# Patient Record
Sex: Female | Born: 1945 | Race: White | Hispanic: No | Marital: Single | Smoking: Former smoker
Health system: Southern US, Community
[De-identification: ages and names within clinical notes are randomized; demographics above are authoritative.]

## PROBLEM LIST (undated history)

## (undated) DIAGNOSIS — F419 Anxiety disorder, unspecified: Secondary | ICD-10-CM

## (undated) DIAGNOSIS — J449 Chronic obstructive pulmonary disease, unspecified: Secondary | ICD-10-CM

## (undated) DIAGNOSIS — F329 Major depressive disorder, single episode, unspecified: Secondary | ICD-10-CM

## (undated) DIAGNOSIS — I10 Essential (primary) hypertension: Secondary | ICD-10-CM

## (undated) DIAGNOSIS — E785 Hyperlipidemia, unspecified: Secondary | ICD-10-CM

## (undated) DIAGNOSIS — F32A Depression, unspecified: Secondary | ICD-10-CM

## (undated) HISTORY — PX: APPENDECTOMY: SHX54

## (undated) HISTORY — PX: SHOULDER SURGERY: SHX246

## (undated) HISTORY — PX: TONSILLECTOMY: SUR1361

---

## 2000-02-16 ENCOUNTER — Other Ambulatory Visit: Admission: RE | Admit: 2000-02-16 | Discharge: 2000-02-16 | Payer: Self-pay | Admitting: Internal Medicine

## 2001-03-21 ENCOUNTER — Other Ambulatory Visit: Admission: RE | Admit: 2001-03-21 | Discharge: 2001-03-21 | Payer: Self-pay | Admitting: Internal Medicine

## 2002-01-02 ENCOUNTER — Encounter: Payer: Self-pay | Admitting: *Deleted

## 2002-01-02 ENCOUNTER — Emergency Department (HOSPITAL_COMMUNITY): Admission: EM | Admit: 2002-01-02 | Discharge: 2002-01-03 | Payer: Self-pay | Admitting: *Deleted

## 2002-02-27 ENCOUNTER — Other Ambulatory Visit: Admission: RE | Admit: 2002-02-27 | Discharge: 2002-02-27 | Payer: Self-pay | Admitting: Internal Medicine

## 2002-10-09 ENCOUNTER — Ambulatory Visit (HOSPITAL_COMMUNITY): Admission: RE | Admit: 2002-10-09 | Discharge: 2002-10-09 | Payer: Self-pay | Admitting: *Deleted

## 2002-10-09 ENCOUNTER — Encounter (INDEPENDENT_AMBULATORY_CARE_PROVIDER_SITE_OTHER): Payer: Self-pay | Admitting: Specialist

## 2003-03-29 ENCOUNTER — Other Ambulatory Visit: Admission: RE | Admit: 2003-03-29 | Discharge: 2003-03-29 | Payer: Self-pay | Admitting: Internal Medicine

## 2004-07-07 ENCOUNTER — Other Ambulatory Visit: Admission: RE | Admit: 2004-07-07 | Discharge: 2004-07-07 | Payer: Self-pay | Admitting: Internal Medicine

## 2005-11-12 ENCOUNTER — Ambulatory Visit (HOSPITAL_COMMUNITY): Admission: RE | Admit: 2005-11-12 | Discharge: 2005-11-12 | Payer: Self-pay | Admitting: *Deleted

## 2005-11-12 ENCOUNTER — Encounter (INDEPENDENT_AMBULATORY_CARE_PROVIDER_SITE_OTHER): Payer: Self-pay | Admitting: Specialist

## 2005-11-19 ENCOUNTER — Other Ambulatory Visit: Admission: RE | Admit: 2005-11-19 | Discharge: 2005-11-19 | Payer: Self-pay | Admitting: Internal Medicine

## 2006-12-13 ENCOUNTER — Other Ambulatory Visit: Admission: RE | Admit: 2006-12-13 | Discharge: 2006-12-13 | Payer: Self-pay | Admitting: Internal Medicine

## 2008-12-09 ENCOUNTER — Other Ambulatory Visit: Admission: RE | Admit: 2008-12-09 | Discharge: 2008-12-09 | Payer: Self-pay | Admitting: Internal Medicine

## 2011-02-09 ENCOUNTER — Other Ambulatory Visit: Payer: Self-pay | Admitting: Surgery

## 2011-03-05 NOTE — Op Note (Signed)
NAME:  BRENYA, TAULBEE                     ACCOUNT NO.:  [id number]   MEDICAL RECORD NO.:  [id number]                   PATIENT TYPE:  AMB   LOCATION:  ENDO                                 FACILITY:  [facility]   PHYSICIAN:  Waverly Ferrari, M.D.                 DATE OF BIRTH:  [birth year]   DATE OF PROCEDURE:  10/09/2002  DATE OF DISCHARGE:                                 OPERATIVE REPORT   PROCEDURE PERFORMED:  Colonoscopy with polypectomy and biopsy and  eradication of polyp.   ENDOSCOPIST:  Waverly Ferrari, M.D.   ANESTHESIA:  Demerol 50 mg, Versed 5 mg.   DESCRIPTION OF PROCEDURE:  With the patient mildly sedated in the left  lateral decubitus position, the Olympus video colonoscope was inserted in  the rectum and passed under direct vision to the cecum, identified by the  ileocecal valve and appendiceal orifice, both of which were photographed and  appeared unremarkable.  From this point the colonoscope was slowly withdrawn  taking circumferential views of the entire colonic mucosa as we pulled back  all the way to the rectum stopping only at 15 cm from the anal verge at  which point, a fairly large sessile polyp/mass was seen, photographed and  first was biopsied, subsequently using snare cautery technique.  A good bit  of the remaining tissue was removed and finally using Erbe argon  photocoagulator, the remainder of the tissue was eradicated using argon  probe.  Once they had been eradicated satisfactorily and there was no  evidence of bleeding, the colonoscope was withdrawn all the way to the  rectum, which appeared normal on direct and showed hemorrhoids on retroflex  view.  The endoscope was straightened and withdrawn.  The patient's vital  signs and pulse oximeter remained stable.  The patient tolerated the  procedure well without apparent complications.   FINDINGS:  Large polyp, question mass at 15 cm from the anal verge, await  biopsy reports.  The patient will call me  for results and follow up with me  as an outpatient.  Will avoid aspirin for two weeks, place the patient on a  low residue diet for the next seven to 14 days.  Internal hemorrhoids.  Otherwise unremarkable examination.                                                 Waverly Ferrari, M.D.    GMO/MEDQ  D:  10/09/2002  T:  10/09/2002  Job:  [id number]

## 2011-03-05 NOTE — Op Note (Signed)
Kayla Mack, LATON NO.:  [id number]   MEDICAL RECORD NO.:  [id number]          PATIENT TYPE:  AMB   LOCATION:  ENDO                         FACILITY:  [facility]   PHYSICIAN:  Waverly Ferrari, M.D.    DATE OF BIRTH:  12-02-1945   DATE OF PROCEDURE:  11/12/2005  DATE OF DISCHARGE:                                 OPERATIVE REPORT   PROCEDURE:  Colonoscopy with biopsy.   INDICATIONS:  Colon polyps.   ANESTHESIA:  Demerol 50, Versed 5 mg.   DESCRIPTION OF PROCEDURE:  With the patient mildly sedated in the left  lateral decubitus position, the Olympus videoscopic colonoscope was inserted  into the rectum and passed under direct vision to the cecum identified by  the ileocecal valve and appendiceal orifice in the cecum were two polyps,  one was removed using hot biopsy forceps technique, the other was more  carpet like and was eradicate using the hot biopsy forceps, both were  photographed once this was completed and subsequently the colonoscope was  withdrawn taking circumferential views of the colonic mucosa stopping next  only in the splenic flexure were a third polyp was seen and it too was  removed using hot biopsy forceps technique, all with the same setting of  20/20 blended current. The endoscope was withdrawn all the way to the rectum  which appeared normal on direct and retroflexed view. The endoscope was  straightened and withdrawn. The patient's vital signs and pulse oximeter  remained stable. The patient tolerated the procedure well without apparent  complication.   FINDINGS:  Polyps of cecum and of splenic flexure. Await biopsy report. The  patient will call me for results and follow-up with me as an outpatient.           ______________________________  Waverly Ferrari, M.D.     GMO/MEDQ  D:  11/12/2005  T:  11/12/2005  Job:  [id number]

## 2011-11-04 DIAGNOSIS — F329 Major depressive disorder, single episode, unspecified: Secondary | ICD-10-CM | POA: Diagnosis not present

## 2011-11-04 DIAGNOSIS — F4323 Adjustment disorder with mixed anxiety and depressed mood: Secondary | ICD-10-CM | POA: Diagnosis not present

## 2011-11-24 DIAGNOSIS — F329 Major depressive disorder, single episode, unspecified: Secondary | ICD-10-CM | POA: Diagnosis not present

## 2011-11-24 DIAGNOSIS — F4323 Adjustment disorder with mixed anxiety and depressed mood: Secondary | ICD-10-CM | POA: Diagnosis not present

## 2011-11-29 DIAGNOSIS — F4323 Adjustment disorder with mixed anxiety and depressed mood: Secondary | ICD-10-CM | POA: Diagnosis not present

## 2011-11-29 DIAGNOSIS — F329 Major depressive disorder, single episode, unspecified: Secondary | ICD-10-CM | POA: Diagnosis not present

## 2011-12-09 DIAGNOSIS — F329 Major depressive disorder, single episode, unspecified: Secondary | ICD-10-CM | POA: Diagnosis not present

## 2011-12-09 DIAGNOSIS — F4323 Adjustment disorder with mixed anxiety and depressed mood: Secondary | ICD-10-CM | POA: Diagnosis not present

## 2011-12-29 DIAGNOSIS — F329 Major depressive disorder, single episode, unspecified: Secondary | ICD-10-CM | POA: Diagnosis not present

## 2011-12-29 DIAGNOSIS — F4323 Adjustment disorder with mixed anxiety and depressed mood: Secondary | ICD-10-CM | POA: Diagnosis not present

## 2012-01-05 DIAGNOSIS — F4323 Adjustment disorder with mixed anxiety and depressed mood: Secondary | ICD-10-CM | POA: Diagnosis not present

## 2012-01-05 DIAGNOSIS — F329 Major depressive disorder, single episode, unspecified: Secondary | ICD-10-CM | POA: Diagnosis not present

## 2012-01-12 DIAGNOSIS — F4323 Adjustment disorder with mixed anxiety and depressed mood: Secondary | ICD-10-CM | POA: Diagnosis not present

## 2012-01-12 DIAGNOSIS — F329 Major depressive disorder, single episode, unspecified: Secondary | ICD-10-CM | POA: Diagnosis not present

## 2012-01-19 DIAGNOSIS — F329 Major depressive disorder, single episode, unspecified: Secondary | ICD-10-CM | POA: Diagnosis not present

## 2012-01-19 DIAGNOSIS — F4323 Adjustment disorder with mixed anxiety and depressed mood: Secondary | ICD-10-CM | POA: Diagnosis not present

## 2012-01-21 DIAGNOSIS — F4323 Adjustment disorder with mixed anxiety and depressed mood: Secondary | ICD-10-CM | POA: Diagnosis not present

## 2012-01-21 DIAGNOSIS — F329 Major depressive disorder, single episode, unspecified: Secondary | ICD-10-CM | POA: Diagnosis not present

## 2012-01-25 DIAGNOSIS — F329 Major depressive disorder, single episode, unspecified: Secondary | ICD-10-CM | POA: Diagnosis not present

## 2012-01-25 DIAGNOSIS — F4323 Adjustment disorder with mixed anxiety and depressed mood: Secondary | ICD-10-CM | POA: Diagnosis not present

## 2012-02-07 DIAGNOSIS — F4323 Adjustment disorder with mixed anxiety and depressed mood: Secondary | ICD-10-CM | POA: Diagnosis not present

## 2012-02-07 DIAGNOSIS — F329 Major depressive disorder, single episode, unspecified: Secondary | ICD-10-CM | POA: Diagnosis not present

## 2012-02-10 DIAGNOSIS — F329 Major depressive disorder, single episode, unspecified: Secondary | ICD-10-CM | POA: Diagnosis not present

## 2012-02-10 DIAGNOSIS — F4323 Adjustment disorder with mixed anxiety and depressed mood: Secondary | ICD-10-CM | POA: Diagnosis not present

## 2012-02-18 DIAGNOSIS — F4323 Adjustment disorder with mixed anxiety and depressed mood: Secondary | ICD-10-CM | POA: Diagnosis not present

## 2012-02-18 DIAGNOSIS — F329 Major depressive disorder, single episode, unspecified: Secondary | ICD-10-CM | POA: Diagnosis not present

## 2012-03-01 DIAGNOSIS — F329 Major depressive disorder, single episode, unspecified: Secondary | ICD-10-CM | POA: Diagnosis not present

## 2012-03-01 DIAGNOSIS — F4323 Adjustment disorder with mixed anxiety and depressed mood: Secondary | ICD-10-CM | POA: Diagnosis not present

## 2012-03-06 DIAGNOSIS — F4323 Adjustment disorder with mixed anxiety and depressed mood: Secondary | ICD-10-CM | POA: Diagnosis not present

## 2012-03-06 DIAGNOSIS — F329 Major depressive disorder, single episode, unspecified: Secondary | ICD-10-CM | POA: Diagnosis not present

## 2012-03-14 DIAGNOSIS — F4323 Adjustment disorder with mixed anxiety and depressed mood: Secondary | ICD-10-CM | POA: Diagnosis not present

## 2012-03-14 DIAGNOSIS — F329 Major depressive disorder, single episode, unspecified: Secondary | ICD-10-CM | POA: Diagnosis not present

## 2012-03-22 DIAGNOSIS — F329 Major depressive disorder, single episode, unspecified: Secondary | ICD-10-CM | POA: Diagnosis not present

## 2012-03-22 DIAGNOSIS — F4323 Adjustment disorder with mixed anxiety and depressed mood: Secondary | ICD-10-CM | POA: Diagnosis not present

## 2012-03-29 DIAGNOSIS — F329 Major depressive disorder, single episode, unspecified: Secondary | ICD-10-CM | POA: Diagnosis not present

## 2012-03-29 DIAGNOSIS — F4323 Adjustment disorder with mixed anxiety and depressed mood: Secondary | ICD-10-CM | POA: Diagnosis not present

## 2012-04-06 DIAGNOSIS — F4323 Adjustment disorder with mixed anxiety and depressed mood: Secondary | ICD-10-CM | POA: Diagnosis not present

## 2012-04-06 DIAGNOSIS — F329 Major depressive disorder, single episode, unspecified: Secondary | ICD-10-CM | POA: Diagnosis not present

## 2012-04-17 DIAGNOSIS — F329 Major depressive disorder, single episode, unspecified: Secondary | ICD-10-CM | POA: Diagnosis not present

## 2012-04-17 DIAGNOSIS — F4323 Adjustment disorder with mixed anxiety and depressed mood: Secondary | ICD-10-CM | POA: Diagnosis not present

## 2012-04-25 DIAGNOSIS — F4323 Adjustment disorder with mixed anxiety and depressed mood: Secondary | ICD-10-CM | POA: Diagnosis not present

## 2012-04-25 DIAGNOSIS — F329 Major depressive disorder, single episode, unspecified: Secondary | ICD-10-CM | POA: Diagnosis not present

## 2012-05-22 DIAGNOSIS — F329 Major depressive disorder, single episode, unspecified: Secondary | ICD-10-CM | POA: Diagnosis not present

## 2012-05-22 DIAGNOSIS — F4323 Adjustment disorder with mixed anxiety and depressed mood: Secondary | ICD-10-CM | POA: Diagnosis not present

## 2012-05-29 DIAGNOSIS — F4323 Adjustment disorder with mixed anxiety and depressed mood: Secondary | ICD-10-CM | POA: Diagnosis not present

## 2012-05-29 DIAGNOSIS — F329 Major depressive disorder, single episode, unspecified: Secondary | ICD-10-CM | POA: Diagnosis not present

## 2012-06-05 DIAGNOSIS — F329 Major depressive disorder, single episode, unspecified: Secondary | ICD-10-CM | POA: Diagnosis not present

## 2012-06-05 DIAGNOSIS — F4323 Adjustment disorder with mixed anxiety and depressed mood: Secondary | ICD-10-CM | POA: Diagnosis not present

## 2012-08-08 DIAGNOSIS — D485 Neoplasm of uncertain behavior of skin: Secondary | ICD-10-CM | POA: Diagnosis not present

## 2012-08-08 DIAGNOSIS — D235 Other benign neoplasm of skin of trunk: Secondary | ICD-10-CM | POA: Diagnosis not present

## 2012-08-08 DIAGNOSIS — L723 Sebaceous cyst: Secondary | ICD-10-CM | POA: Diagnosis not present

## 2012-09-22 DIAGNOSIS — Z23 Encounter for immunization: Secondary | ICD-10-CM | POA: Diagnosis not present

## 2012-09-22 DIAGNOSIS — K4090 Unilateral inguinal hernia, without obstruction or gangrene, not specified as recurrent: Secondary | ICD-10-CM | POA: Diagnosis not present

## 2012-12-05 DIAGNOSIS — S62609A Fracture of unspecified phalanx of unspecified finger, initial encounter for closed fracture: Secondary | ICD-10-CM | POA: Diagnosis not present

## 2012-12-07 DIAGNOSIS — M19049 Primary osteoarthritis, unspecified hand: Secondary | ICD-10-CM | POA: Diagnosis not present

## 2012-12-08 DIAGNOSIS — M19049 Primary osteoarthritis, unspecified hand: Secondary | ICD-10-CM | POA: Diagnosis not present

## 2013-01-25 DIAGNOSIS — E7800 Pure hypercholesterolemia, unspecified: Secondary | ICD-10-CM | POA: Diagnosis not present

## 2013-01-25 DIAGNOSIS — J209 Acute bronchitis, unspecified: Secondary | ICD-10-CM | POA: Diagnosis not present

## 2013-04-30 DIAGNOSIS — F4323 Adjustment disorder with mixed anxiety and depressed mood: Secondary | ICD-10-CM | POA: Diagnosis not present

## 2013-04-30 DIAGNOSIS — F329 Major depressive disorder, single episode, unspecified: Secondary | ICD-10-CM | POA: Diagnosis not present

## 2013-05-03 DIAGNOSIS — F329 Major depressive disorder, single episode, unspecified: Secondary | ICD-10-CM | POA: Diagnosis not present

## 2013-05-03 DIAGNOSIS — F4323 Adjustment disorder with mixed anxiety and depressed mood: Secondary | ICD-10-CM | POA: Diagnosis not present

## 2013-05-07 DIAGNOSIS — F329 Major depressive disorder, single episode, unspecified: Secondary | ICD-10-CM | POA: Diagnosis not present

## 2013-05-07 DIAGNOSIS — F4323 Adjustment disorder with mixed anxiety and depressed mood: Secondary | ICD-10-CM | POA: Diagnosis not present

## 2013-05-14 DIAGNOSIS — F329 Major depressive disorder, single episode, unspecified: Secondary | ICD-10-CM | POA: Diagnosis not present

## 2013-05-14 DIAGNOSIS — F4323 Adjustment disorder with mixed anxiety and depressed mood: Secondary | ICD-10-CM | POA: Diagnosis not present

## 2013-05-17 DIAGNOSIS — F4323 Adjustment disorder with mixed anxiety and depressed mood: Secondary | ICD-10-CM | POA: Diagnosis not present

## 2013-05-17 DIAGNOSIS — F329 Major depressive disorder, single episode, unspecified: Secondary | ICD-10-CM | POA: Diagnosis not present

## 2013-05-21 DIAGNOSIS — F329 Major depressive disorder, single episode, unspecified: Secondary | ICD-10-CM | POA: Diagnosis not present

## 2013-05-21 DIAGNOSIS — F4323 Adjustment disorder with mixed anxiety and depressed mood: Secondary | ICD-10-CM | POA: Diagnosis not present

## 2013-05-28 DIAGNOSIS — F4323 Adjustment disorder with mixed anxiety and depressed mood: Secondary | ICD-10-CM | POA: Diagnosis not present

## 2013-05-28 DIAGNOSIS — F329 Major depressive disorder, single episode, unspecified: Secondary | ICD-10-CM | POA: Diagnosis not present

## 2013-05-29 ENCOUNTER — Emergency Department (HOSPITAL_COMMUNITY)
Admission: EM | Admit: 2013-05-29 | Discharge: 2013-05-29 | Disposition: A | Discharge status: Home / Self Care | Payer: Medicare Other | Attending: Emergency Medicine | Admitting: Emergency Medicine

## 2013-05-29 ENCOUNTER — Encounter (HOSPITAL_COMMUNITY): Payer: Self-pay | Admitting: Emergency Medicine

## 2013-05-29 ENCOUNTER — Ambulatory Visit (INDEPENDENT_AMBULATORY_CARE_PROVIDER_SITE_OTHER): Payer: Medicare Other | Admitting: Psychiatry

## 2013-05-29 ENCOUNTER — Encounter (HOSPITAL_COMMUNITY): Payer: Self-pay | Admitting: Psychiatry

## 2013-05-29 VITALS — BP 140/84 | HR 88 | Resp 14 | Ht 63.0 in | Wt 112.0 lb

## 2013-05-29 DIAGNOSIS — F1010 Alcohol abuse, uncomplicated: Secondary | ICD-10-CM

## 2013-05-29 DIAGNOSIS — F3289 Other specified depressive episodes: Secondary | ICD-10-CM | POA: Insufficient documentation

## 2013-05-29 DIAGNOSIS — F411 Generalized anxiety disorder: Secondary | ICD-10-CM | POA: Insufficient documentation

## 2013-05-29 DIAGNOSIS — R4182 Altered mental status, unspecified: Secondary | ICD-10-CM | POA: Diagnosis not present

## 2013-05-29 DIAGNOSIS — Z79899 Other long term (current) drug therapy: Secondary | ICD-10-CM | POA: Diagnosis not present

## 2013-05-29 DIAGNOSIS — I10 Essential (primary) hypertension: Secondary | ICD-10-CM | POA: Insufficient documentation

## 2013-05-29 DIAGNOSIS — E785 Hyperlipidemia, unspecified: Secondary | ICD-10-CM | POA: Insufficient documentation

## 2013-05-29 DIAGNOSIS — Z880 Allergy status to penicillin: Secondary | ICD-10-CM | POA: Diagnosis not present

## 2013-05-29 DIAGNOSIS — F329 Major depressive disorder, single episode, unspecified: Secondary | ICD-10-CM | POA: Diagnosis not present

## 2013-05-29 DIAGNOSIS — F39 Unspecified mood [affective] disorder: Secondary | ICD-10-CM | POA: Diagnosis not present

## 2013-05-29 DIAGNOSIS — F1020 Alcohol dependence, uncomplicated: Secondary | ICD-10-CM

## 2013-05-29 DIAGNOSIS — F17200 Nicotine dependence, unspecified, uncomplicated: Secondary | ICD-10-CM | POA: Diagnosis not present

## 2013-05-29 HISTORY — DX: Hyperlipidemia, unspecified: E78.5

## 2013-05-29 HISTORY — DX: Anxiety disorder, unspecified: F41.9

## 2013-05-29 HISTORY — DX: Depression, unspecified: F32.A

## 2013-05-29 HISTORY — DX: Essential (primary) hypertension: I10

## 2013-05-29 HISTORY — DX: Major depressive disorder, single episode, unspecified: F32.9

## 2013-05-29 LAB — COMPREHENSIVE METABOLIC PANEL
ALT: 15 U/L (ref 0–35)
AST: 27 U/L (ref 0–37)
Albumin: 3.8 g/dL (ref 3.5–5.2)
Alkaline Phosphatase: 89 U/L (ref 39–117)
BUN: 7 mg/dL (ref 6–23)
CO2: 23 mEq/L (ref 19–32)
Calcium: 8.9 mg/dL (ref 8.4–10.5)
Chloride: 95 mEq/L — ABNORMAL LOW (ref 96–112)
Creatinine, Ser: 0.62 mg/dL (ref 0.50–1.10)
GFR calc Af Amer: >90 mL/min (ref >90)
GFR calc non Af Amer: >90 mL/min (ref >90)
Glucose, Bld: 88 mg/dL (ref 70–99)
Potassium: 3.5 mEq/L (ref 3.5–5.1)
Sodium: 130 mEq/L — ABNORMAL LOW (ref 135–145)
Total Bilirubin: 0.4 mg/dL (ref 0.3–1.2)
Total Protein: 7 g/dL (ref 6.0–8.3)

## 2013-05-29 LAB — CBC WITH DIFFERENTIAL/PLATELET
Basophils Absolute: 0 10*3/uL (ref 0.0–0.1)
Basophils Relative: 0 % (ref 0–1)
Eosinophils Absolute: 0.2 10*3/uL (ref 0.0–0.7)
Eosinophils Relative: 2 % (ref 0–5)
HCT: 41.4 % (ref 36.0–46.0)
Hemoglobin: 14.3 g/dL (ref 12.0–15.0)
Lymphocytes Relative: 21 % (ref 12–46)
Lymphs Abs: 2.1 10*3/uL (ref 0.7–4.0)
MCH: 32.6 pg (ref 26.0–34.0)
MCHC: 34.5 g/dL (ref 30.0–36.0)
MCV: 94.5 fL (ref 78.0–100.0)
Monocytes Absolute: 0.6 10*3/uL (ref 0.1–1.0)
Monocytes Relative: 6 % (ref 3–12)
Neutro Abs: 7.1 10*3/uL (ref 1.7–7.7)
Neutrophils Relative %: 71 % (ref 43–77)
Platelets: 294 10*3/uL (ref 150–400)
RBC: 4.38 MIL/uL (ref 3.87–5.11)
RDW: 12.8 % (ref 11.5–15.5)
WBC: 9.9 10*3/uL (ref 4.0–10.5)

## 2013-05-29 LAB — RAPID URINE DRUG SCREEN, HOSP PERFORMED
Amphetamines: NOT DETECTED
Barbiturates: NOT DETECTED
Benzodiazepines: NOT DETECTED
Cocaine: NOT DETECTED
Opiates: NOT DETECTED
Tetrahydrocannabinol: NOT DETECTED

## 2013-05-29 LAB — ETHANOL: Alcohol, Ethyl (B): 16 mg/dL — ABNORMAL HIGH (ref 0–11)

## 2013-05-29 NOTE — ED Notes (Addendum)
Pt has 1 shirt, 1 pants, sandals, underwear, and bra.  Placed in locker 30.

## 2013-05-29 NOTE — ED Notes (Addendum)
Pt presenting to ed with c/o "I've been battling depression for a long time and she saw her counselor today that referred her to come to ed for blood work. 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 Pt denies SI/HI at this time

## 2013-05-29 NOTE — ED Provider Notes (Signed)
CSN: [id number]     Arrival date & time 05/29/13  1525 History     First MD Initiated Contact with Patient 05/29/13 1531     Chief Complaint  Patient presents with  . Medical Clearance   (Consider location/radiation/quality/duration/timing/severity/associated sxs/prior Treatment) Patient is a 67 y.o. female presenting with altered mental status. The history is provided by the patient (pt sent here by the psychiatrist for possible alcohol abuse and evaluation.  the pt is not suicidal and does not want in pt tx). No language interpreter was used.  Altered Mental Status Presenting symptoms comment:  Depressed Severity:  Moderate Most recent episode:  More than 2 days ago Episode history:  Single Timing:  Constant Progression:  Waxing and waning Associated symptoms: no abdominal pain, no hallucinations, no headaches, no rash and no seizures     Past Medical History  Diagnosis Date  . Depression   . Hypertension   . Anxiety   . Hyperlipidemia    History reviewed. No pertinent past surgical history. No family history on file. History  Substance Use Topics  . Smoking status: Current Every Day Smoker -- 1.50 packs/day  . Smokeless tobacco: Not on file  . Alcohol Use: Yes     Comment: liquor    OB History   Grav Para Term Preterm Abortions TAB SAB Ect Mult Living                 Review of Systems  Constitutional: Negative for appetite change and fatigue.  HENT: Negative for congestion, sinus pressure and ear discharge.   Eyes: Negative for discharge.  Respiratory: Negative for cough.   Cardiovascular: Negative for chest pain.  Gastrointestinal: Negative for abdominal pain and diarrhea.  Genitourinary: Negative for frequency and hematuria.  Musculoskeletal: Negative for back pain.  Skin: Negative for rash.  Neurological: Negative for seizures and headaches.  Psychiatric/Behavioral: Positive for dysphoric mood and altered mental status. Negative for suicidal ideas,  hallucinations and self-injury.    Allergies  Aleve and Penicillins  Home Medications   Current Outpatient Rx  Name  Route  Sig  Dispense  Refill  . acetaminophen (TYLENOL) 500 MG tablet   Oral   Take 1,000 mg by mouth every 6 (six) hours as needed for pain.         [name] atenolol (TENORMIN) 50 MG tablet   Oral   Take 50 mg by mouth every morning.         . clonazePAM (KLONOPIN) 0.5 MG tablet   Oral   Take 0.5 mg by mouth 3 (three) times daily as needed for anxiety.         . lovastatin (MEVACOR) 40 MG tablet   Oral   Take 40 mg by mouth every morning.         . sertraline (ZOLOFT) 100 MG tablet   Oral   Take 100 mg by mouth every morning.          BP 129/80  Pulse 72  Temp(Src) 98.1 F (36.7 C) (Oral)  Resp 16  SpO2 92% Physical Exam  Constitutional: She is oriented to person, place, and time. She appears well-developed.  HENT:  Head: Normocephalic.  Eyes: Conjunctivae and EOM are normal. No scleral icterus.  Neck: Neck supple. No thyromegaly present.  Cardiovascular: Normal rate and regular rhythm.  Exam reveals no gallop and no friction rub.   No murmur heard. Pulmonary/Chest: No stridor. She has no wheezes. She has no rales. She exhibits no tenderness.  Abdominal: She exhibits no distension. There is no tenderness. There is no rebound.  Musculoskeletal: Normal range of motion. She exhibits no edema.  Lymphadenopathy:    She has no cervical adenopathy.  Neurological: She is oriented to person, place, and time. Coordination normal.  Skin: No rash noted. No erythema.  Psychiatric:  Depressed and not suicidal    ED Course   Procedures (including critical care time)  Labs Reviewed  COMPREHENSIVE METABOLIC PANEL - Abnormal; Notable for the following:    Sodium 130 (*)    Chloride 95 (*)    All other components within normal limits  ETHANOL - Abnormal; Notable for the following:    Alcohol, Ethyl (B) 16 (*)    All other components within normal  limits  CBC WITH DIFFERENTIAL  URINE RAPID DRUG SCREEN (HOSP PERFORMED)   No results found. 1. Depression     MDM    Maudry Diego, MD 05/29/13 2207

## 2013-05-29 NOTE — Progress Notes (Signed)
Patient ID: Kayla Mack, female   DOB: 10/13/1946, 67 y.o.   MRN: [id number] Psychiatric assessment note Patient is a 67 year old Caucasian female who came for her initial appointment.  Patient told that she was referred from her therapist as she was concerned about her drinking.  She reported that she is under a lot of stress.  She is taking care of her elderly mother.  She is easily moved but did not provide much detail.  Her speech was rambling and she has alcohol on her breath.  On further questioning patient become more upset and irritable.  Then I asked when was the last time she drank initially she told that she drinks last night but later admitted that she has drink alcohol today.  She did not provide the details of alcohol intake.  She admitted that she is drinking because she is under a lot of stress.  She keeps asking for psychiatric help but her part processes tangential in her speech was rambling incoherent and fast.  Patient admitted that she has not taken her Klonopin because she was afraid taking Klonopin and mixing with alcohol.  She denies any other drug use.  She admitted that she is driving her vehicle and she has no one to take her back.  Patient was explained because of drinking and appears intoxicated she is not safe to go home.  She was also explained that due to intoxication a complete psychological evaluation cannot be done at this time.  She was recommended to go medical emergency room for clearance. Once she is medically cleared, she will need psychiatric evaluation and treatment options and disposition can be discussed at that time.  Time spent 30 minutes.  Mental status examination Patient is an elderly woman who is casually dressed.  She has a splint on her left wrist.  She is easily distracted.  Her speech is fast rambling and incoherent.  She has alcohol on her breath.  Her thought processes tangential.  Her attention and concentration is poor.  She has difficulty  organizing her thoughts.  She denies any suicidal thoughts or homicidal thoughts but admitted severe depression and anxiety.  At this time complete mental status cannot be done as patient is intoxicated with alcohol.  Her insight and judgment is limited.    Assessment Alcohol intoxication   Plan Refer to emergency room for medical clearance and disposition.

## 2013-05-29 NOTE — Progress Notes (Signed)
[facility] ED CM consulted by EDP, Zammit about pt Orders entered

## 2013-05-29 NOTE — ED Notes (Signed)
Patient denies SI, HI and AVH at discharge. No acute distress noted.

## 2013-05-30 NOTE — BH Assessment (Signed)
Assessment Note  Kayla Mack is an 67 y.o. female. Pt presents to [facility] for medical clearance. Pt presents with C/O Medical Clearance, Substance Use, and Depression. Pt reports that she is drinking etoh at least 3 days per week as she reports she is "self medication" to deal with her stress. Pt reports that she drinks Rum and Coke(Soda) mixed. Pt reports that she usually drinks in the morning. Pt reports that she feels overwhelmed caring for her elderly parents who are very sick and "dying". Pt reports that she devotes so much time to her mother's care that she neglects her own needs. 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 Pt reports that she has been feeling more depressed since retiring from her employer over the past year. Pt reports that she was scheduled to be evaluated by Dr. Adele Schilder today because she thought he was going to diagnose her with "manic depression" or something but instead she said she was transferred to [facility]. Pt denies SI,HI, and no AVH reported. CDIOP recommended and pt declined at this time as she needs time to think about it. Pt reports that she has an appt. With her OPT on Thursday and plans on attending as scheduled stating that OPT is really helpful. Consulted with Dr. Roderic Palau who agreed to d/c pt home.  Axis I: Substance Induced Mood Disorder Axis II: Deferred Axis III:  Past Medical History  Diagnosis Date  . Depression   . Hypertension   . Anxiety   . Hyperlipidemia    Axis IV: economic problems, other psychosocial or environmental problems and stressful being the primary caregiver of 2 elderly parents Axis V: 51-60 moderate symptoms  Past Medical History:  Past Medical History  Diagnosis Date  . Depression   . Hypertension   . Anxiety   . Hyperlipidemia     History reviewed. No pertinent past surgical  history.  Family History: No family history on file.  Social History:  reports that she has been smoking.  She does not have any smokeless tobacco history on file. She reports that  drinks alcohol. She reports that she does not use illicit drugs.  Additional Social History:  Alcohol / Drug Use History of alcohol / drug use?: Yes Substance #1 Name of Substance 1:  (Etoh-Rum ) 1 - Age of First Use:  (20 or 21) 1 - Amount (size/oz):  (about 3 shots of liquor) 1 - Frequency:  (3 days per week) 1 - Duration:  (on-going use) 1 - Last Use / Amount:  (05/29/13- between 9am-10am in the morning/ 2 shots)  CIWA: CIWA-Ar BP: 129/80 mmHg Pulse Rate: 72 COWS:    Allergies:  Allergies  Allergen Reactions  . Aleve (Naproxen Sodium) Hives  . Penicillins Hives    Home Medications:  (Not in a hospital admission)  OB/GYN Status:  No LMP recorded. Patient is postmenopausal.  General Assessment Data Location of Assessment: [facility] ED Is this a Tele or Face-to-Face Assessment?: Face-to-Face Is this an Initial Assessment or a Re-assessment for this encounter?: Initial Assessment Living Arrangements: Alone Can pt return to current living arrangement?: Yes Admission Status: Voluntary Is patient capable of signing voluntary admission?: Yes Transfer from: Other (Comment) ([facility]-was evaluated by Dr. Adele Schilder prior to ED admission) Referral Source: Other     [facility] Crisis Care Plan Living Arrangements: Alone Name of Psychiatrist: No [place] Provider  Name of Therapist: Kirstie Mirza Alcott     Risk to self Suicidal Ideation: No Suicidal Intent: No Is patient at risk for suicide?: No Suicidal Plan?: No Access to Means: No What has been your use of drugs/alcohol within the last 12 months?: Rum  Previous Attempts/Gestures: No How many times?: 0 Other Self Harm Risks: none reported Triggers for Past Attempts: None known Intentional Self Injurious Behavior: None Family Suicide History: No Recent stressful  life event(s): Financial Problems;Other (Comment) (caring for her sick elderly mother, real estate no going wel) Persecutory voices/beliefs?: No Depression: Yes Depression Symptoms: Tearfulness;Fatigue;Feeling worthless/self pity;Feeling angry/irritable Substance abuse history and/or treatment for substance abuse?: Yes Suicide prevention information given to non-admitted patients: Yes  Risk to Others Homicidal Ideation: No Thoughts of Harm to Others: No Current Homicidal Intent: No Current Homicidal Plan: No Access to Homicidal Means: No Identified Victim: na History of harm to others?: No Assessment of Violence: None Noted Violent Behavior Description: None Noted Does patient have access to weapons?: No Criminal Charges Pending?: No Does patient have a court date: No  Psychosis Hallucinations: None noted Delusions: None noted  Mental Status Report Appear/Hygiene: Other (Comment) (Dressed in hospital scrubs) Eye Contact: Fair Motor Activity: Freedom of movement Speech: Logical/coherent Level of Consciousness: Alert Mood: Anxious Affect: Anxious;Appropriate to circumstance;Depressed Anxiety Level: Minimal Thought Processes: Coherent;Relevant;Irrelevant;Circumstantial Judgement: Impaired Orientation: Person;Place;Time;Situation Obsessive Compulsive Thoughts/Behaviors: None  Cognitive Functioning Concentration: Normal Memory: Recent Intact;Remote Intact IQ: Average Insight: Fair Impulse Control: Fair Appetite: Fair Weight Loss: 0 Weight Gain: 0 Sleep: No Change Total Hours of Sleep: 6 Vegetative Symptoms: None  ADLScreening [facility] Assessment Services) Patient's cognitive ability adequate to safely complete daily activities?: Yes Patient able to express need for assistance with ADLs?: Yes Independently performs ADLs?: Yes (appropriate for developmental age)  Prior Inpatient Therapy Prior Inpatient Therapy: Yes Prior Therapy Dates: 7-18yr ago Prior Therapy  [facility] Reason for Treatment: [facility]   Prior Outpatient Therapy Prior Outpatient Therapy: Yes Prior Therapy Dates: Current provider Prior Therapy [facility]: [facility]  ADL Screening (condition at time of admission) Patient's cognitive ability adequate to safely complete daily activities?: Yes Is the patient deaf or have difficulty hearing?: No Does the patient have difficulty seeing, even when wearing glasses/contacts?: No Does the patient have difficulty concentrating, remembering, or making decisions?: Yes Patient able to express need for assistance with ADLs?: Yes Does the patient have difficulty dressing or bathing?: No Independently performs ADLs?: Yes (appropriate for developmental age) Does the patient have difficulty walking or climbing stairs?: No Weakness of Legs: None Weakness of Arms/Hands: None  Home Assistive Devices/Equipment Home Assistive Devices/Equipment: None    Abuse/Neglect Assessment (Assessment to be complete while patient is alone) Physical Abuse: Denies Verbal Abuse: Denies Sexual Abuse: Denies Exploitation of patient/patient's resources: Denies Self-Neglect: Denies Values / Beliefs Cultural Requests During Hospitalization: None Spiritual Requests During Hospitalization: None   Advance Directives (For Healthcare) Advance Directive: Patient does not have advance directive    Additional Information 1:1 In Past 12 Months?: No CIRT Risk: No Elopement Risk: No Does patient have medical clearance?: Yes     Disposition:  Disposition Initial Assessment Completed for this Encounter: Yes Disposition of Patient: Outpatient treatment (pt declined CDIOP at this time, She needs to think it over) Type of outpatient treatment: [facility] - Intensive Outpatient (Pt will continue follow-up with her OPT)  On Site Evaluation by:   Reviewed with  Physician:  Bynum Bellows 05/30/2013 12:10 AM

## 2013-05-31 DIAGNOSIS — F329 Major depressive disorder, single episode, unspecified: Secondary | ICD-10-CM | POA: Diagnosis not present

## 2013-05-31 DIAGNOSIS — F4323 Adjustment disorder with mixed anxiety and depressed mood: Secondary | ICD-10-CM | POA: Diagnosis not present

## 2013-06-02 ENCOUNTER — Inpatient Hospital Stay (HOSPITAL_COMMUNITY)
Admission: EM | Admit: 2013-06-02 | Discharge: 2013-06-06 | DRG: 552 | Disposition: A | Discharge status: Skilled Nursing Facility | Payer: Medicare Other | Attending: General Surgery | Admitting: General Surgery

## 2013-06-02 ENCOUNTER — Emergency Department (HOSPITAL_COMMUNITY): Payer: Medicare Other

## 2013-06-02 ENCOUNTER — Encounter (HOSPITAL_COMMUNITY): Payer: Self-pay | Admitting: Emergency Medicine

## 2013-06-02 DIAGNOSIS — J449 Chronic obstructive pulmonary disease, unspecified: Secondary | ICD-10-CM | POA: Diagnosis present

## 2013-06-02 DIAGNOSIS — S22009A Unspecified fracture of unspecified thoracic vertebra, initial encounter for closed fracture: Principal | ICD-10-CM | POA: Diagnosis present

## 2013-06-02 DIAGNOSIS — M949 Disorder of cartilage, unspecified: Secondary | ICD-10-CM | POA: Diagnosis present

## 2013-06-02 DIAGNOSIS — S52572A Other intraarticular fracture of lower end of left radius, initial encounter for closed fracture: Secondary | ICD-10-CM

## 2013-06-02 DIAGNOSIS — S3981XA Other specified injuries of abdomen, initial encounter: Secondary | ICD-10-CM | POA: Diagnosis not present

## 2013-06-02 DIAGNOSIS — S52532A Colles' fracture of left radius, initial encounter for closed fracture: Secondary | ICD-10-CM

## 2013-06-02 DIAGNOSIS — S92009A Unspecified fracture of unspecified calcaneus, initial encounter for closed fracture: Secondary | ICD-10-CM | POA: Diagnosis present

## 2013-06-02 DIAGNOSIS — R079 Chest pain, unspecified: Secondary | ICD-10-CM | POA: Diagnosis not present

## 2013-06-02 DIAGNOSIS — Z79899 Other long term (current) drug therapy: Secondary | ICD-10-CM

## 2013-06-02 DIAGNOSIS — R109 Unspecified abdominal pain: Secondary | ICD-10-CM | POA: Diagnosis not present

## 2013-06-02 DIAGNOSIS — S22081A Stable burst fracture of T11-T12 vertebra, initial encounter for closed fracture: Secondary | ICD-10-CM

## 2013-06-02 DIAGNOSIS — J4489 Other specified chronic obstructive pulmonary disease: Secondary | ICD-10-CM | POA: Diagnosis present

## 2013-06-02 DIAGNOSIS — M899 Disorder of bone, unspecified: Secondary | ICD-10-CM | POA: Diagnosis present

## 2013-06-02 DIAGNOSIS — R0902 Hypoxemia: Secondary | ICD-10-CM | POA: Diagnosis not present

## 2013-06-02 DIAGNOSIS — F17200 Nicotine dependence, unspecified, uncomplicated: Secondary | ICD-10-CM | POA: Diagnosis present

## 2013-06-02 DIAGNOSIS — F329 Major depressive disorder, single episode, unspecified: Secondary | ICD-10-CM | POA: Diagnosis present

## 2013-06-02 DIAGNOSIS — Z880 Allergy status to penicillin: Secondary | ICD-10-CM

## 2013-06-02 DIAGNOSIS — T148XXA Other injury of unspecified body region, initial encounter: Secondary | ICD-10-CM | POA: Diagnosis not present

## 2013-06-02 DIAGNOSIS — S22000A Wedge compression fracture of unspecified thoracic vertebra, initial encounter for closed fracture: Secondary | ICD-10-CM

## 2013-06-02 DIAGNOSIS — F3289 Other specified depressive episodes: Secondary | ICD-10-CM | POA: Diagnosis present

## 2013-06-02 DIAGNOSIS — S298XXA Other specified injuries of thorax, initial encounter: Secondary | ICD-10-CM | POA: Diagnosis not present

## 2013-06-02 DIAGNOSIS — M549 Dorsalgia, unspecified: Secondary | ICD-10-CM | POA: Diagnosis not present

## 2013-06-02 DIAGNOSIS — M8580 Other specified disorders of bone density and structure, unspecified site: Secondary | ICD-10-CM | POA: Diagnosis present

## 2013-06-02 DIAGNOSIS — S52599A Other fractures of lower end of unspecified radius, initial encounter for closed fracture: Secondary | ICD-10-CM | POA: Diagnosis not present

## 2013-06-02 DIAGNOSIS — K5900 Constipation, unspecified: Secondary | ICD-10-CM | POA: Diagnosis not present

## 2013-06-02 DIAGNOSIS — Z886 Allergy status to analgesic agent status: Secondary | ICD-10-CM

## 2013-06-02 DIAGNOSIS — F1010 Alcohol abuse, uncomplicated: Secondary | ICD-10-CM | POA: Diagnosis present

## 2013-06-02 DIAGNOSIS — M545 Low back pain: Secondary | ICD-10-CM | POA: Diagnosis not present

## 2013-06-02 DIAGNOSIS — Z9089 Acquired absence of other organs: Secondary | ICD-10-CM

## 2013-06-02 DIAGNOSIS — S92001A Unspecified fracture of right calcaneus, initial encounter for closed fracture: Secondary | ICD-10-CM

## 2013-06-02 DIAGNOSIS — I10 Essential (primary) hypertension: Secondary | ICD-10-CM | POA: Diagnosis present

## 2013-06-02 DIAGNOSIS — S99919A Unspecified injury of unspecified ankle, initial encounter: Secondary | ICD-10-CM | POA: Diagnosis not present

## 2013-06-02 DIAGNOSIS — E785 Hyperlipidemia, unspecified: Secondary | ICD-10-CM | POA: Diagnosis present

## 2013-06-02 DIAGNOSIS — W11XXXA Fall on and from ladder, initial encounter: Secondary | ICD-10-CM

## 2013-06-02 DIAGNOSIS — IMO0002 Reserved for concepts with insufficient information to code with codable children: Secondary | ICD-10-CM | POA: Diagnosis not present

## 2013-06-02 DIAGNOSIS — S8990XA Unspecified injury of unspecified lower leg, initial encounter: Secondary | ICD-10-CM | POA: Diagnosis not present

## 2013-06-02 DIAGNOSIS — F411 Generalized anxiety disorder: Secondary | ICD-10-CM | POA: Diagnosis present

## 2013-06-02 HISTORY — DX: Chronic obstructive pulmonary disease, unspecified: J44.9

## 2013-06-02 LAB — BASIC METABOLIC PANEL
BUN: 10 mg/dL (ref 6–23)
CO2: 22 mEq/L (ref 19–32)
Calcium: 8.9 mg/dL (ref 8.4–10.5)
Chloride: 99 mEq/L (ref 96–112)
Creatinine, Ser: 0.62 mg/dL (ref 0.50–1.10)
GFR calc Af Amer: >90 mL/min (ref >90)
GFR calc non Af Amer: >90 mL/min (ref >90)
Glucose, Bld: 88 mg/dL (ref 70–99)
Potassium: 3.6 mEq/L (ref 3.5–5.1)
Sodium: 134 mEq/L — ABNORMAL LOW (ref 135–145)

## 2013-06-02 LAB — CBC WITH DIFFERENTIAL/PLATELET
Basophils Absolute: 0 10*3/uL (ref 0.0–0.1)
Basophils Relative: 0 % (ref 0–1)
Eosinophils Absolute: 0 10*3/uL (ref 0.0–0.7)
Eosinophils Relative: 0 % (ref 0–5)
HCT: 38 % (ref 36.0–46.0)
Hemoglobin: 13.5 g/dL (ref 12.0–15.0)
Lymphocytes Relative: 7 % — ABNORMAL LOW (ref 12–46)
Lymphs Abs: 1.3 10*3/uL (ref 0.7–4.0)
MCH: 33.2 pg (ref 26.0–34.0)
MCHC: 35.5 g/dL (ref 30.0–36.0)
MCV: 93.4 fL (ref 78.0–100.0)
Monocytes Absolute: 0.8 10*3/uL (ref 0.1–1.0)
Monocytes Relative: 4 % (ref 3–12)
Neutro Abs: 16 10*3/uL — ABNORMAL HIGH (ref 1.7–7.7)
Neutrophils Relative %: 88 % — ABNORMAL HIGH (ref 43–77)
Platelets: 257 10*3/uL (ref 150–400)
RBC: 4.07 MIL/uL (ref 3.87–5.11)
RDW: 12.9 % (ref 11.5–15.5)
WBC: 18.1 10*3/uL — ABNORMAL HIGH (ref 4.0–10.5)

## 2013-06-02 LAB — URINALYSIS, ROUTINE W REFLEX MICROSCOPIC
Bilirubin Urine: NEGATIVE
Glucose, UA: NEGATIVE mg/dL
Hgb urine dipstick: NEGATIVE
Ketones, ur: NEGATIVE mg/dL
Leukocytes, UA: NEGATIVE
Nitrite: NEGATIVE
Protein, ur: NEGATIVE mg/dL
Specific Gravity, Urine: 1.01 (ref 1.005–1.030)
Urobilinogen, UA: 0.2 mg/dL (ref 0.0–1.0)
pH: 6 (ref 5.0–8.0)

## 2013-06-02 MED ORDER — MORPHINE SULFATE 4 MG/ML IJ SOLN
4.0000 mg | Freq: Once | INTRAMUSCULAR | Status: AC
  Administered 2013-06-02: 4 mg via INTRAVENOUS
  Filled 2013-06-02: qty 1

## 2013-06-02 MED ORDER — MORPHINE SULFATE 2 MG/ML IJ SOLN
2.0000 mg | Freq: Once | INTRAMUSCULAR | Status: AC
  Administered 2013-06-02: 2 mg via INTRAVENOUS
  Filled 2013-06-02: qty 1

## 2013-06-02 MED ORDER — IOHEXOL 300 MG/ML  SOLN
100.0000 mL | Freq: Once | INTRAMUSCULAR | Status: AC | PRN
  Administered 2013-06-02: 100 mL via INTRAVENOUS

## 2013-06-02 NOTE — ED Notes (Signed)
Per [facility] pt fell off the top of a 6 foot ladder approx 2 hours ago. Pt fell backward landing on her Right ankle, Right ankle is swollen and bruised, pt c/o lumbar back pain and Left wrist pain with a hx of injury to the same wrist, pt wears a brace on her Left wrist. Pt has good distal pulses below injuries. Pt a/o x4. Pt arrived to ED in KED immobilizer and ice applied to Right ankle. VSS BP 116/70, HR 74, rr 20

## 2013-06-02 NOTE — ED Notes (Signed)
Pt placed on continuous pulse oximetry and blood pressure cuff; pt arrived in KED and c-collar still intact

## 2013-06-02 NOTE — Consult Note (Signed)
ORTHOPAEDIC CONSULTATION  REQUESTING PHYSICIAN: Merryl Hacker, MD  Chief Complaint: Right calcaneus fracture -extra-articular, Left min displaced distal radius fracture  HPI: Kayla Mack is a 67 y.o. female who complains of  Fall from a ladder of roughly 8 ft she has pain at her L wrist, R heel and mid back.   Past Medical History  Diagnosis Date  . Depression   . Hypertension   . Anxiety   . Hyperlipidemia    Past Surgical History  Procedure Laterality Date  . Appendectomy    . Tonsillectomy    . Shoulder surgery Right Pin   History   Social History  . Marital Status: Single    Spouse Name: N/A    Number of Children: N/A  . Years of Education: N/A   Social History Main Topics  . Smoking status: Current Every Day Smoker -- 1.50 packs/day  . Smokeless tobacco: None  . Alcohol Use: Yes     Comment: liquor   . Drug Use: No  . Sexual Activity: None   Other Topics Concern  . None   Social History Narrative  . None   No family history on file. Allergies  Allergen Reactions  . Aleve [Naproxen Sodium] Hives  . Aspirin Other (See Comments)    Facial swelling  . Penicillins Hives   Prior to Admission medications   Medication Sig Start Date End Date Taking? Authorizing Provider  acetaminophen (TYLENOL) 500 MG tablet Take 1,000 mg by mouth 2 (two) times daily as needed for pain.    Yes Historical Provider, MD  atenolol (TENORMIN) 50 MG tablet Take 50 mg by mouth every morning.   Yes Historical Provider, MD  clonazePAM (KLONOPIN) 0.5 MG tablet Take 0.5 mg by mouth 2 (two) times daily as needed for anxiety.    Yes Historical Provider, MD  lovastatin (MEVACOR) 40 MG tablet Take 40 mg by mouth every morning.   Yes Historical Provider, MD  sertraline (ZOLOFT) 100 MG tablet Take 100 mg by mouth every morning.   Yes Historical Provider, MD   Dg Wrist Complete Left  06/02/2013   *RADIOLOGY REPORT*  Clinical Data: Fall, left wrist pain  LEFT WRIST -  COMPLETE 3+ VIEW  Comparison: None.  Findings: Four views of the left wrist submitted.  Study is limited by diffuse osteopenia.  There is nondisplaced mild impacted fracture in distal left radial metaphysis.  On lateral view the fracture line is seen involving the distal articular surface of the radius. Degenerative changes first carpal metacarpal joint.  IMPRESSION: There is nondisplaced mild impacted fracture in distal left radial metaphysis.  On lateral view the fracture line is seen involving the distal articular surface of the radius.   Original Report Authenticated By: Lahoma Crocker, M.D.   Dg Ankle Complete Right  06/02/2013   *RADIOLOGY REPORT*  Clinical Data: Fall  RIGHT ANKLE - COMPLETE 3+ VIEW  Comparison: None.  Findings: Three views of the right ankle submitted.  Study is limited by diffuse osteopenia.  Ankle mortise is preserved.  No ankle fracture.  There is nondisplaced fracture mid aspect of the calcaneus.  IMPRESSION: No ankle fracture.  Nondisplaced fracture  of the calcaneus.  Soft tissue swelling in hind foot.   Original Report Authenticated By: Lahoma Crocker, M.D.   Dg Foot Complete Right  06/02/2013   *RADIOLOGY REPORT*  Clinical Data: 67 year old female status post fall with pain.  RIGHT FOOT COMPLETE - 3+ VIEW  Comparison: None.  Findings:  Comminuted but fairly nondisplaced at this time fracture of the calcaneus (see lateral view).  Small ossific fragment lateral to the cuboid is favored to be an accessory ossicle and not the sequelae of trauma.  Tarsal bones other than the calcaneus appear intact.  Metatarsals and phalanges appear intact.  IMPRESSION: 1.  Comminuted fracture of the calcaneus, fairly nondisplaced at this time. 2.  No additional acute fracture or dislocation identified in the right foot.   Original Report Authenticated By: Roselyn Reef, M.D.    Positive ROS: All other systems have been reviewed and were otherwise negative with the exception of those mentioned in the HPI and  as above.  Physical Exam: Filed Vitals:   06/02/13 1939  BP: 123/74  Pulse: 79  Temp: 98.4 F (36.9 C)  Resp: 18   General: Alert, no acute distress Cardiovascular: No pedal edema Respiratory: No cyanosis, no use of accessory musculature GI: No organomegaly, abdomen is soft and non-tender Skin: No lesions in the area of chief complaint Neurologic: Sensation intact distally Psychiatric: Patient is competent for consent with normal mood and affect Lymphatic: No axillary or cervical lymphadenopathy  MUSCULOSKELETAL:  LUE: SILT M/R/U nerve, 2+ radial pulse, +EPL/FPL/IO Compartments soft Splint C/D/I  RLE: SILT DP/SP/S/S/T nerve, 2+ DP, +TA/GS/EHL Compartments soft Minimal swelling about calcaneus fx.    Other extremities are atraumatic with painless ROM and NVI.  Assessment: R min displaced calc fx, L distal radius fx, T12 compression fx with some retropulsion   Plan: Anticipate non-op care of both extremity fractures Neurosurgery taking care of her spine injury Weight Bearing Status: NWB LUE  NWB LUE wrist  Ok to bear weight as tolerated for LUE through lebow PT/OT VTE px: SCD's and Lovenox Dispo: likely to need SNF/AIR given dual extremity injury and no one to take care of her at home.   Edmonia Lynch, D, MD Cell [phone]   06/02/2013 8:32 PM

## 2013-06-02 NOTE — ED Notes (Signed)
Ortho Tech at bedside.

## 2013-06-02 NOTE — ED Notes (Signed)
Pt currently in Radiology at this time.

## 2013-06-02 NOTE — ED Notes (Signed)
Large ice pack placed on pt's right ankle

## 2013-06-02 NOTE — Progress Notes (Signed)
Orthopedic Tech Progress Note Patient Details:  Kayla Mack [birth year] [id number]  Ortho Devices Type of Ortho Device: Ace wrap;Sugartong splint;Stirrup splint Ortho Device/Splint Location: left arm/right leg Ortho Device/Splint Interventions: Application   [name] 06/02/2013, 10:37 PM

## 2013-06-03 ENCOUNTER — Encounter (HOSPITAL_COMMUNITY): Payer: Self-pay | Admitting: *Deleted

## 2013-06-03 DIAGNOSIS — S22081A Stable burst fracture of T11-T12 vertebra, initial encounter for closed fracture: Secondary | ICD-10-CM

## 2013-06-03 DIAGNOSIS — M8580 Other specified disorders of bone density and structure, unspecified site: Secondary | ICD-10-CM | POA: Diagnosis present

## 2013-06-03 DIAGNOSIS — W11XXXA Fall on and from ladder, initial encounter: Secondary | ICD-10-CM

## 2013-06-03 DIAGNOSIS — S92009A Unspecified fracture of unspecified calcaneus, initial encounter for closed fracture: Secondary | ICD-10-CM

## 2013-06-03 DIAGNOSIS — S5290XA Unspecified fracture of unspecified forearm, initial encounter for closed fracture: Secondary | ICD-10-CM | POA: Diagnosis not present

## 2013-06-03 DIAGNOSIS — S22009A Unspecified fracture of unspecified thoracic vertebra, initial encounter for closed fracture: Secondary | ICD-10-CM | POA: Diagnosis not present

## 2013-06-03 DIAGNOSIS — M48061 Spinal stenosis, lumbar region without neurogenic claudication: Secondary | ICD-10-CM | POA: Diagnosis not present

## 2013-06-03 DIAGNOSIS — D72829 Elevated white blood cell count, unspecified: Secondary | ICD-10-CM | POA: Diagnosis not present

## 2013-06-03 DIAGNOSIS — S52532A Colles' fracture of left radius, initial encounter for closed fracture: Secondary | ICD-10-CM

## 2013-06-03 DIAGNOSIS — S92001A Unspecified fracture of right calcaneus, initial encounter for closed fracture: Secondary | ICD-10-CM

## 2013-06-03 LAB — CBC
HCT: 37.1 % (ref 36.0–46.0)
Hemoglobin: 12.9 g/dL (ref 12.0–15.0)
MCH: 32.8 pg (ref 26.0–34.0)
MCHC: 34.8 g/dL (ref 30.0–36.0)
MCV: 94.4 fL (ref 78.0–100.0)
Platelets: 223 10*3/uL (ref 150–400)
RBC: 3.93 MIL/uL (ref 3.87–5.11)
RDW: 13.1 % (ref 11.5–15.5)
WBC: 9.9 10*3/uL (ref 4.0–10.5)

## 2013-06-03 LAB — BASIC METABOLIC PANEL
BUN: 9 mg/dL (ref 6–23)
CO2: 24 mEq/L (ref 19–32)
Calcium: 8.7 mg/dL (ref 8.4–10.5)
Chloride: 101 mEq/L (ref 96–112)
Creatinine, Ser: 0.62 mg/dL (ref 0.50–1.10)
GFR calc Af Amer: >90 mL/min (ref >90)
GFR calc non Af Amer: >90 mL/min (ref >90)
Glucose, Bld: 127 mg/dL — ABNORMAL HIGH (ref 70–99)
Potassium: 3.9 mEq/L (ref 3.5–5.1)
Sodium: 134 mEq/L — ABNORMAL LOW (ref 135–145)

## 2013-06-03 MED ORDER — PANTOPRAZOLE SODIUM 40 MG IV SOLR
40.0000 mg | Freq: Every day | INTRAVENOUS | Status: DC
  Filled 2013-06-03 (×2): qty 40

## 2013-06-03 MED ORDER — HYDROMORPHONE HCL PF 1 MG/ML IJ SOLN
1.0000 mg | INTRAMUSCULAR | Status: DC | PRN
  Administered 2013-06-03 (×5): 1 mg via INTRAVENOUS
  Filled 2013-06-03 (×5): qty 1

## 2013-06-03 MED ORDER — ATENOLOL 50 MG PO TABS
50.0000 mg | ORAL_TABLET | Freq: Every morning | ORAL | Status: DC
  Administered 2013-06-04 – 2013-06-06 (×3): 50 mg via ORAL
  Filled 2013-06-03 (×4): qty 1

## 2013-06-03 MED ORDER — ONDANSETRON HCL 4 MG/2ML IJ SOLN: 4.0000 mg | Freq: Four times a day (QID) | INTRAMUSCULAR | Status: DC | PRN

## 2013-06-03 MED ORDER — ACETAMINOPHEN 500 MG PO TABS
1000.0000 mg | ORAL_TABLET | Freq: Two times a day (BID) | ORAL | Status: DC | PRN
  Filled 2013-06-03: qty 2

## 2013-06-03 MED ORDER — KCL IN DEXTROSE-NACL 20-5-0.45 MEQ/L-%-% IV SOLN
INTRAVENOUS | Status: DC
  Administered 2013-06-03 (×2): via INTRAVENOUS
  Administered 2013-06-04: 20 mL/h via INTRAVENOUS
  Filled 2013-06-03 (×3): qty 1000

## 2013-06-03 MED ORDER — CLONAZEPAM 0.5 MG PO TABS
0.5000 mg | ORAL_TABLET | Freq: Two times a day (BID) | ORAL | Status: DC | PRN
  Administered 2013-06-06: 0.5 mg via ORAL
  Filled 2013-06-03: qty 1

## 2013-06-03 MED ORDER — ONDANSETRON HCL 4 MG PO TABS: 4.0000 mg | ORAL_TABLET | Freq: Four times a day (QID) | ORAL | Status: DC | PRN

## 2013-06-03 MED ORDER — SERTRALINE HCL 50 MG PO TABS
100.0000 mg | ORAL_TABLET | Freq: Every morning | ORAL | Status: DC
  Administered 2013-06-03 – 2013-06-06 (×4): 100 mg via ORAL
  Filled 2013-06-03 (×4): qty 2

## 2013-06-03 MED ORDER — PANTOPRAZOLE SODIUM 40 MG PO TBEC
40.0000 mg | DELAYED_RELEASE_TABLET | Freq: Every day | ORAL | Status: DC
  Administered 2013-06-03 – 2013-06-06 (×4): 40 mg via ORAL
  Filled 2013-06-03 (×3): qty 1

## 2013-06-03 MED ORDER — MORPHINE SULFATE 2 MG/ML IJ SOLN
2.0000 mg | Freq: Once | INTRAMUSCULAR | Status: AC
  Administered 2013-06-03: 2 mg via INTRAVENOUS
  Filled 2013-06-03: qty 1

## 2013-06-03 MED ORDER — HYDROCODONE-ACETAMINOPHEN 5-325 MG PO TABS
1.0000 | ORAL_TABLET | ORAL | Status: DC | PRN
  Administered 2013-06-03 (×2): 1 via ORAL
  Filled 2013-06-03 (×2): qty 1

## 2013-06-03 MED ORDER — SIMVASTATIN 20 MG PO TABS
20.0000 mg | ORAL_TABLET | Freq: Every day | ORAL | Status: DC
  Administered 2013-06-03 – 2013-06-05 (×3): 20 mg via ORAL
  Filled 2013-06-03 (×4): qty 1

## 2013-06-03 MED ORDER — ENOXAPARIN SODIUM 40 MG/0.4ML ~~LOC~~ SOLN
40.0000 mg | SUBCUTANEOUS | Status: DC
  Administered 2013-06-03 – 2013-06-06 (×4): 40 mg via SUBCUTANEOUS
  Filled 2013-06-03 (×5): qty 0.4

## 2013-06-03 MED ORDER — DOCUSATE SODIUM 100 MG PO CAPS
100.0000 mg | ORAL_CAPSULE | Freq: Two times a day (BID) | ORAL | Status: DC
  Administered 2013-06-03 – 2013-06-06 (×6): 100 mg via ORAL
  Filled 2013-06-03 (×8): qty 1

## 2013-06-03 MED ORDER — BISACODYL 10 MG RE SUPP: 10.0000 mg | Freq: Every day | RECTAL | Status: DC | PRN

## 2013-06-03 MED ORDER — CALCIUM CARBONATE-VITAMIN D 500-200 MG-UNIT PO TABS
1.0000 | ORAL_TABLET | Freq: Two times a day (BID) | ORAL | Status: DC
  Administered 2013-06-03 – 2013-06-06 (×6): 1 via ORAL
  Filled 2013-06-03 (×8): qty 1

## 2013-06-03 NOTE — ED Provider Notes (Signed)
CSN: [id number]     Arrival date & time 06/02/13  1811 History     First MD Initiated Contact with Patient 06/02/13 1816     Chief Complaint  Patient presents with  . Fall   (Consider location/radiation/quality/duration/timing/severity/associated sxs/prior Treatment) HPI This is a 67 year old female with history of hypertension and tobacco abuse who presents after falling off a 6 foot ladder. The patient fell backwards onto her left hand and on her back. She is endorsing left wrist pain, right ankle pain, and back pain. She denies any loss of consciousness. She denies hitting her head. She denies any aspirin, Plavix, or Coumadin use. Past Medical History  Diagnosis Date  . Depression   . Hypertension   . Anxiety   . Hyperlipidemia    Past Surgical History  Procedure Laterality Date  . Appendectomy    . Tonsillectomy    . Shoulder surgery Right Pin   No family history on file. History  Substance Use Topics  . Smoking status: Current Every Day Smoker -- 1.50 packs/day  . Smokeless tobacco: Not on file  . Alcohol Use: Yes     Comment: liquor    OB History   Grav Para Term Preterm Abortions TAB SAB Ect Mult Living                 Review of Systems  Constitutional: Negative.   HENT: Negative for neck pain and neck stiffness.   Respiratory: Negative.  Negative for cough, shortness of breath and wheezing.   Cardiovascular: Negative.  Negative for chest pain.  Gastrointestinal: Negative.  Negative for nausea, vomiting and abdominal pain.  Genitourinary: Negative for urgency.  Skin: Negative for wound.  Neurological: Negative for dizziness, weakness and numbness.  Psychiatric/Behavioral: Negative.   All other systems reviewed and are negative.    Allergies  Aleve; Aspirin; and Penicillins  Home Medications   Current Outpatient Rx  Name  Route  Sig  Dispense  Refill  . acetaminophen (TYLENOL) 500 MG tablet   Oral   Take 1,000 mg by mouth 2 (two) times daily as  needed for pain.          [name] atenolol (TENORMIN) 50 MG tablet   Oral   Take 50 mg by mouth every morning.         . clonazePAM (KLONOPIN) 0.5 MG tablet   Oral   Take 0.5 mg by mouth 2 (two) times daily as needed for anxiety.          . lovastatin (MEVACOR) 40 MG tablet   Oral   Take 40 mg by mouth every morning.         . sertraline (ZOLOFT) 100 MG tablet   Oral   Take 100 mg by mouth every morning.          BP 127/68  Pulse 80  Temp(Src) 98.4 F (36.9 C) (Oral)  Resp 18  SpO2 91% Physical Exam  Constitutional: She is oriented to person, place, and time. She appears well-developed and well-nourished. No distress.  HENT:  Head: Normocephalic and atraumatic.  Right Ear: Tympanic membrane normal.  Left Ear: Tympanic membrane normal.  Neck: No spinous process tenderness and no muscular tenderness present.  Cardiovascular: Normal rate, regular rhythm, S1 normal and S2 normal.   Pulmonary/Chest: Effort normal. Not tachypneic. She has no decreased breath sounds. She has no wheezes.  Abdominal: Soft. Bowel sounds are normal. There is no tenderness.  Musculoskeletal:       Right wrist:  She exhibits tenderness and swelling.       Arms:      Right foot: She exhibits tenderness and swelling.  Lower thoracic, upper lumbar tenderness to palpation without obvious step off or deformity  Neurological: She is alert and oriented to person, place, and time. She has normal strength. No cranial nerve deficit. GCS eye subscore is 4. GCS verbal subscore is 5. GCS motor subscore is 6.  Skin: No abrasion and no laceration noted.  Psychiatric: She has a normal mood and affect.    ED Course   Medications  morphine 4 MG/ML injection 4 mg (4 mg Intravenous Given 06/02/13 1845)  morphine 4 MG/ML injection 4 mg (4 mg Intravenous Given 06/02/13 2022)  iohexol (OMNIPAQUE) 300 MG/ML solution 100 mL (100 mL Intravenous Contrast Given 06/02/13 2036)  morphine 2 MG/ML injection 2 mg (2 mg  Intravenous Given 06/02/13 2200)    Procedures (including critical care time)  Labs Reviewed  CBC WITH DIFFERENTIAL - Abnormal; Notable for the following:    WBC 18.1 (*)    Neutrophils Relative % 88 (*)    Neutro Abs 16.0 (*)    Lymphocytes Relative 7 (*)    All other components within normal limits  BASIC METABOLIC PANEL - Abnormal; Notable for the following:    Sodium 134 (*)    All other components within normal limits  URINALYSIS, ROUTINE W REFLEX MICROSCOPIC   Dg Wrist Complete Left  06/02/2013   *RADIOLOGY REPORT*  Clinical Data: Fall, left wrist pain  LEFT WRIST - COMPLETE 3+ VIEW  Comparison: None.  Findings: Four views of the left wrist submitted.  Study is limited by diffuse osteopenia.  There is nondisplaced mild impacted fracture in distal left radial metaphysis.  On lateral view the fracture line is seen involving the distal articular surface of the radius. Degenerative changes first carpal metacarpal joint.  IMPRESSION: There is nondisplaced mild impacted fracture in distal left radial metaphysis.  On lateral view the fracture line is seen involving the distal articular surface of the radius.   Original Report Authenticated By: Lahoma Crocker, M.D.   Dg Ankle Complete Right  06/02/2013   *RADIOLOGY REPORT*  Clinical Data: Fall  RIGHT ANKLE - COMPLETE 3+ VIEW  Comparison: None.  Findings: Three views of the right ankle submitted.  Study is limited by diffuse osteopenia.  Ankle mortise is preserved.  No ankle fracture.  There is nondisplaced fracture mid aspect of the calcaneus.  IMPRESSION: No ankle fracture.  Nondisplaced fracture  of the calcaneus.  Soft tissue swelling in hind foot.   Original Report Authenticated By: Lahoma Crocker, M.D.   Ct Chest W Contrast  06/02/2013   *RADIOLOGY REPORT*  Clinical Data:  67 year old female status post fall from 6 feet ladder.  Pain.  CT CHEST, ABDOMEN AND PELVIS WITH CONTRAST  Technique:  Multidetector CT imaging of the chest, abdomen and pelvis  was performed following the standard protocol during bolus administration of intravenous contrast.  Contrast:  100 ml Omnipaque-300.  Comparison:  [facility] chest radiographs 07/28/2009.  CT CHEST  Findings:  Severe upper lobe predominant centrilobular emphysema. No pneumothorax. Mild dependent pulmonary opacity in both lungs most resembling atelectasis.  No definite pleural effusion.  Osteopenia.  No rib fracture identified.  Sternum intact.  Thoracic spine is reported below.  Negative thoracic inlet.  No mediastinal hematoma.  No pericardial effusion.  Thoracic aorta appears intact with atherosclerosis. Mild enlargement of the central pulmonary arteries.  No mediastinal lymphadenopathy.  No axillary lymphadenopathy.  IMPRESSION:  1.  See thoracic spine injury described in the dedicated spine report below.  2.  No other acute traumatic injury identified in the chest. Emphysema and mild atelectasis.  3.  Abdomen, pelvis, and spine findings are below.  CT ABDOMEN AND PELVIS  Findings:  Lumbar spine findings are reported below.  Osteopenia.  Sacrum and SI joints intact.  No acute pelvis fracture identified.  Proximal femurs intact.  No pelvic free fluid.  Nondilated large and small bowel loops in the pelvis.  Mildly distended bladder.  Uterus and adnexa appear to be surgically absent.  No dilated bowel loops.  Redundant colon.  Stool and fluid in the proximal colon.  Decompressed stomach and duodenum.  No liver injury identified.  Mildly decreased liver density throughout suggesting a degree of steatosis, and small sub centimeter left lobe hypodense areas which probably are benign cysts or biliary hamartomas.  Intact spleen with occasional calcified granulomas.  Negative pancreas, adrenal glands, and gallbladder.  Portal venous system appears grossly normal.  Widespread atherosclerosis of the aorta and its branches.  Major arterial structures in the abdomen and pelvis are patent.  No abdominal free  fluid.  No lymphadenopathy identified.  IMPRESSION:   1. No acute traumatic injury identified in the abdomen and pelvis.  2.  See thoracic and lumbar spine findings below.   CT THORACIC SPINE WITHOUT CONTRAST  Technique: Multidetector CT imaging of the thoracic spine was performed without intravenous contrast administration. Multiplanar CT image reconstructions were also generated.  Findings:  Osteopenia.  Mild anterolisthesis of C7 on T1 appears to be degenerative and is associated with bilateral facet hypertrophy.  Similar mild anterolisthesis of T2-9 T3 appears to be degenerative and associated with facet hypertrophy.  Severe T12 vertebral body fracture, severely comminuted with retropulsion of the posterior-superior endplate up to 6 mm.  This mildly narrows the AP spinal canal dimension 210-11 mm.  The pedicles appear intact along with the remaining T12 posterior elements.  Minimal to mild paraspinal edema/stranding.  No CT evidence of epidural hemorrhage.  Mild T7 superior endplate deformity appears to be chronic and probably degenerative. Other thoracic vertebrae are intact. Posterior ribs are intact.  The L1 vertebrae is intact.  See additional lumbar details below.  IMPRESSION:   1.  Severely comminuted T12 vertebral body fracture, with a severe compression versus mild burst configuration.  Some retropulsion of the posterior-superior endplate, but with relatively well maintained patency of the spinal canal at this level (AP dimension and 10 mm).  T12 posterior elements appear intact.  2.  Osteopenia.  No other acute thoracic spine fracture identified. Mild degenerative anterolisthesis at C7-T1 and T2-T3.  3.  Lumbar spine findings are below.  CT LUMBAR SPINE WITHOUT CONTRAST  Technique:  Multidetector CT imaging of the lumbar spine was performed without intravenous contrast administration.  Multiplanar CT image reconstructions were also generated.  Findings:  Normal lumbar segmentation.  T12 fracture as  detailed above.  Lumbar vertebrae intact.  Lumbar vertebral height and alignment normal except for trace anterolisthesis of L4 on L5, with associated moderate to severe facet degeneration at that level. Visible sacrum appears intact. Visualized paraspinal soft tissues are within normal limits.  Advanced L3-L4 disc degeneration with vacuum disc phenomena. Superimposed posterior element hypertrophy.  This level and the L3- L4 level demonstrate multifactorial moderate to severe spinal stenosis.  IMPRESSION:  1.  No acute traumatic injury in the lumbar spine.  2.  Degenerative multifactorial L3-L4 and L4-L5  spinal stenosis.  Study discussed by telephone with Dr. Dina Rich on 06/02/2013 at 2229 hours.   Original Report Authenticated By: Roselyn Reef, M.D.   Ct Lumbar Spine Wo Contrast  06/02/2013   *RADIOLOGY REPORT*  Clinical Data:  67 year old female status post fall from 6 feet ladder.  Pain.  CT CHEST, ABDOMEN AND PELVIS WITH CONTRAST  Technique:  Multidetector CT imaging of the chest, abdomen and pelvis was performed following the standard protocol during bolus administration of intravenous contrast.  Contrast:  100 ml Omnipaque-300.  Comparison:  [facility] chest radiographs 07/28/2009.  CT CHEST  Findings:  Severe upper lobe predominant centrilobular emphysema. No pneumothorax. Mild dependent pulmonary opacity in both lungs most resembling atelectasis.  No definite pleural effusion.  Osteopenia.  No rib fracture identified.  Sternum intact.  Thoracic spine is reported below.  Negative thoracic inlet.  No mediastinal hematoma.  No pericardial effusion.  Thoracic aorta appears intact with atherosclerosis. Mild enlargement of the central pulmonary arteries.  No mediastinal lymphadenopathy.  No axillary lymphadenopathy.  IMPRESSION:  1.  See thoracic spine injury described in the dedicated spine report below.  2.  No other acute traumatic injury identified in the chest. Emphysema and mild  atelectasis.  3.  Abdomen, pelvis, and spine findings are below.  CT ABDOMEN AND PELVIS  Findings:  Lumbar spine findings are reported below.  Osteopenia.  Sacrum and SI joints intact.  No acute pelvis fracture identified.  Proximal femurs intact.  No pelvic free fluid.  Nondilated large and small bowel loops in the pelvis.  Mildly distended bladder.  Uterus and adnexa appear to be surgically absent.  No dilated bowel loops.  Redundant colon.  Stool and fluid in the proximal colon.  Decompressed stomach and duodenum.  No liver injury identified.  Mildly decreased liver density throughout suggesting a degree of steatosis, and small sub centimeter left lobe hypodense areas which probably are benign cysts or biliary hamartomas.  Intact spleen with occasional calcified granulomas.  Negative pancreas, adrenal glands, and gallbladder.  Portal venous system appears grossly normal.  Widespread atherosclerosis of the aorta and its branches.  Major arterial structures in the abdomen and pelvis are patent.  No abdominal free fluid.  No lymphadenopathy identified.  IMPRESSION:   1. No acute traumatic injury identified in the abdomen and pelvis.  2.  See thoracic and lumbar spine findings below.   CT THORACIC SPINE WITHOUT CONTRAST  Technique: Multidetector CT imaging of the thoracic spine was performed without intravenous contrast administration. Multiplanar CT image reconstructions were also generated.  Findings:  Osteopenia.  Mild anterolisthesis of C7 on T1 appears to be degenerative and is associated with bilateral facet hypertrophy.  Similar mild anterolisthesis of T2-9 T3 appears to be degenerative and associated with facet hypertrophy.  Severe T12 vertebral body fracture, severely comminuted with retropulsion of the posterior-superior endplate up to 6 mm.  This mildly narrows the AP spinal canal dimension 210-11 mm.  The pedicles appear intact along with the remaining T12 posterior elements.  Minimal to mild paraspinal  edema/stranding.  No CT evidence of epidural hemorrhage.  Mild T7 superior endplate deformity appears to be chronic and probably degenerative. Other thoracic vertebrae are intact. Posterior ribs are intact.  The L1 vertebrae is intact.  See additional lumbar details below.  IMPRESSION:   1.  Severely comminuted T12 vertebral body fracture, with a severe compression versus mild burst configuration.  Some retropulsion of the posterior-superior endplate, but with relatively well maintained patency of  the spinal canal at this level (AP dimension and 10 mm).  T12 posterior elements appear intact.  2.  Osteopenia.  No other acute thoracic spine fracture identified. Mild degenerative anterolisthesis at C7-T1 and T2-T3.  3.  Lumbar spine findings are below.  CT LUMBAR SPINE WITHOUT CONTRAST  Technique:  Multidetector CT imaging of the lumbar spine was performed without intravenous contrast administration.  Multiplanar CT image reconstructions were also generated.  Findings:  Normal lumbar segmentation.  T12 fracture as detailed above.  Lumbar vertebrae intact.  Lumbar vertebral height and alignment normal except for trace anterolisthesis of L4 on L5, with associated moderate to severe facet degeneration at that level. Visible sacrum appears intact. Visualized paraspinal soft tissues are within normal limits.  Advanced L3-L4 disc degeneration with vacuum disc phenomena. Superimposed posterior element hypertrophy.  This level and the L3- L4 level demonstrate multifactorial moderate to severe spinal stenosis.  IMPRESSION:  1.  No acute traumatic injury in the lumbar spine.  2.  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  Study discussed by telephone with Dr. Dina Rich on 06/02/2013 at 2229 hours.   Original Report Authenticated By: Roselyn Reef, M.D.   Ct Abdomen Pelvis W Contrast  06/02/2013   *RADIOLOGY REPORT*  Clinical Data:  67 year old female status post fall from 6 feet ladder.  Pain.  CT CHEST, ABDOMEN  AND PELVIS WITH CONTRAST  Technique:  Multidetector CT imaging of the chest, abdomen and pelvis was performed following the standard protocol during bolus administration of intravenous contrast.  Contrast:  100 ml Omnipaque-300.  Comparison:  [facility] chest radiographs 07/28/2009.  CT CHEST  Findings:  Severe upper lobe predominant centrilobular emphysema. No pneumothorax. Mild dependent pulmonary opacity in both lungs most resembling atelectasis.  No definite pleural effusion.  Osteopenia.  No rib fracture identified.  Sternum intact.  Thoracic spine is reported below.  Negative thoracic inlet.  No mediastinal hematoma.  No pericardial effusion.  Thoracic aorta appears intact with atherosclerosis. Mild enlargement of the central pulmonary arteries.  No mediastinal lymphadenopathy.  No axillary lymphadenopathy.  IMPRESSION:  1.  See thoracic spine injury described in the dedicated spine report below.  2.  No other acute traumatic injury identified in the chest. Emphysema and mild atelectasis.  3.  Abdomen, pelvis, and spine findings are below.  CT ABDOMEN AND PELVIS  Findings:  Lumbar spine findings are reported below.  Osteopenia.  Sacrum and SI joints intact.  No acute pelvis fracture identified.  Proximal femurs intact.  No pelvic free fluid.  Nondilated large and small bowel loops in the pelvis.  Mildly distended bladder.  Uterus and adnexa appear to be surgically absent.  No dilated bowel loops.  Redundant colon.  Stool and fluid in the proximal colon.  Decompressed stomach and duodenum.  No liver injury identified.  Mildly decreased liver density throughout suggesting a degree of steatosis, and small sub centimeter left lobe hypodense areas which probably are benign cysts or biliary hamartomas.  Intact spleen with occasional calcified granulomas.  Negative pancreas, adrenal glands, and gallbladder.  Portal venous system appears grossly normal.  Widespread atherosclerosis of the aorta and  its branches.  Major arterial structures in the abdomen and pelvis are patent.  No abdominal free fluid.  No lymphadenopathy identified.  IMPRESSION:   1. No acute traumatic injury identified in the abdomen and pelvis.  2.  See thoracic and lumbar spine findings below.   CT THORACIC SPINE WITHOUT CONTRAST  Technique: Multidetector CT imaging of  the thoracic spine was performed without intravenous contrast administration. Multiplanar CT image reconstructions were also generated.  Findings:  Osteopenia.  Mild anterolisthesis of C7 on T1 appears to be degenerative and is associated with bilateral facet hypertrophy.  Similar mild anterolisthesis of T2-9 T3 appears to be degenerative and associated with facet hypertrophy.  Severe T12 vertebral body fracture, severely comminuted with retropulsion of the posterior-superior endplate up to 6 mm.  This mildly narrows the AP spinal canal dimension 210-11 mm.  The pedicles appear intact along with the remaining T12 posterior elements.  Minimal to mild paraspinal edema/stranding.  No CT evidence of epidural hemorrhage.  Mild T7 superior endplate deformity appears to be chronic and probably degenerative. Other thoracic vertebrae are intact. Posterior ribs are intact.  The L1 vertebrae is intact.  See additional lumbar details below.  IMPRESSION:   1.  Severely comminuted T12 vertebral body fracture, with a severe compression versus mild burst configuration.  Some retropulsion of the posterior-superior endplate, but with relatively well maintained patency of the spinal canal at this level (AP dimension and 10 mm).  T12 posterior elements appear intact.  2.  Osteopenia.  No other acute thoracic spine fracture identified. Mild degenerative anterolisthesis at C7-T1 and T2-T3.  3.  Lumbar spine findings are below.  CT LUMBAR SPINE WITHOUT CONTRAST  Technique:  Multidetector CT imaging of the lumbar spine was performed without intravenous contrast administration.  Multiplanar CT image  reconstructions were also generated.  Findings:  Normal lumbar segmentation.  T12 fracture as detailed above.  Lumbar vertebrae intact.  Lumbar vertebral height and alignment normal except for trace anterolisthesis of L4 on L5, with associated moderate to severe facet degeneration at that level. Visible sacrum appears intact. Visualized paraspinal soft tissues are within normal limits.  Advanced L3-L4 disc degeneration with vacuum disc phenomena. Superimposed posterior element hypertrophy.  This level and the L3- L4 level demonstrate multifactorial moderate to severe spinal stenosis.  IMPRESSION:  1.  No acute traumatic injury in the lumbar spine.  2.  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  Study discussed by telephone with Dr. Dina Rich on 06/02/2013 at 2229 hours.   Original Report Authenticated By: Roselyn Reef, M.D.   Dg Foot Complete Right  06/02/2013   *RADIOLOGY REPORT*  Clinical Data: 67 year old female status post fall with pain.  RIGHT FOOT COMPLETE - 3+ VIEW  Comparison: None.  Findings: Comminuted but fairly nondisplaced at this time fracture of the calcaneus (see lateral view).  Small ossific fragment lateral to the cuboid is favored to be an accessory ossicle and not the sequelae of trauma.  Tarsal bones other than the calcaneus appear intact.  Metatarsals and phalanges appear intact.  IMPRESSION: 1.  Comminuted fracture of the calcaneus, fairly nondisplaced at this time. 2.  No additional acute fracture or dislocation identified in the right foot.   Original Report Authenticated By: Roselyn Reef, M.D.   Ct Extrem Lower Wo Cm Bil  06/02/2013   *RADIOLOGY REPORT*  Clinical Data:  Fall, calcaneal fracture  CT BILATERAL FEET WITH AND WITHOUT CONTRAST  Technique:  Multidetector CT imaging of the bilateral feet was performed according to the standard protocol before and following intravenous contrast administration. Multiplanar CT image reconstructions were also generated.   Comparison:  Conventional radiographs obtained earlier today at 1912 hours  Findings:  Cross-sectional imaging to the bilateral feet demonstrates a comminuted fracture through the body of the right calcaneus.  There is minimal displacement and no significant depression.  Incidental note is made of bilateral os tibialis externa, os trigonum and os peroneus.  No definite fracture of the left calcaneus. No focal soft tissue abnormality.  IMPRESSION:  Comminuted, nondisplaced fracture through the body of the right calcaneus.  No significant height loss or bony compaction.   Original Report Authenticated By: Jacqulynn Cadet, M.D.   No diagnosis found.  #1 calcaneus fracture #2 left radius fracture #3 thoracic spine burst fracture. MDM  This is a 67 year old female who fell backwards off a ladder. She did not lose consciousness or hit her head. The patient is currently endorsing back pain, right foot, and left arm pain. She was boarded and collared. Patient's vital signs are notable for a pulse ox of 90%. She is unsure what her normal oxygen saturation is but is a heavy smoker.  The patient is in no acute distress. Given that she did not hit her head and endorses no neck pain, I was able to clinically clear her c-collar at the bedside. At the time of my initial assessment, she did not appear to have a significant distracting injury.  Plain films of the right foot revealed calcaneal fracture as well as plain films for the left upper terminate reveal a radius fracture. Orthopedics was consulted. They have requested a CT scan of her right foot. The patient has been splinted. They have no operative intervention at this time. Given the patient's back pain, the patient was sent for CT scan of the chest, abdomen, and spine. There is no evidence of solid organ injury, however the patient was noted to have a T12 burst fracture. Neurosurgery was consulted for this. The patient will be placed in a TLSO brace.  Given the  patient's age and the extent of her injuries, she will need hospitalization for physical therapy. Given the injury found on CT and x-ray, I completed her traumagram with a CT head and neck.  Trauma was consulted given the patient's multisystem trauma. I anticipate the patient will be admitted to the hospital for further management.  Merryl Hacker, MD 06/03/13 [phone]

## 2013-06-03 NOTE — Consult Note (Signed)
Reason for [place] Referring Physician: ER  Kayla Mack is an 67 y.o. female.  HPI: PATIENT WHO FELL FROM A LADDER 6 FEET HIGH with no history of loc but pain in lumbo-thoracic area an extremities. W/u done in the er  Past Medical History  Diagnosis Date  . Depression   . Hypertension   . Anxiety   . Hyperlipidemia     Past Surgical History  Procedure Laterality Date  . Appendectomy    . Tonsillectomy    . Shoulder surgery Right Pin    No family history on file.  Social History:  reports that she has been smoking.  She does not have any smokeless tobacco history on file. She reports that  drinks alcohol. She reports that she does not use illicit drugs.  Allergies:  Allergies  Allergen Reactions  . Aleve [Naproxen Sodium] Hives  . Aspirin Other (See Comments)    Facial swelling  . Penicillins Hives    Medications:see notes     Results for orders placed during the hospital encounter of 06/02/13 (from the past 48 hour(s))  CBC WITH DIFFERENTIAL     Status: Abnormal   Collection Time    06/02/13  6:40 PM      Result Value Range   WBC 18.1 (*) 4.0 - 10.5 K/uL   RBC 4.07  3.87 - 5.11 MIL/uL   Hemoglobin 13.5  12.0 - 15.0 g/dL   HCT 38.0  36.0 - 46.0 %   MCV 93.4  78.0 - 100.0 fL   MCH 33.2  26.0 - 34.0 pg   MCHC 35.5  30.0 - 36.0 g/dL   RDW 12.9  11.5 - 15.5 %   Platelets 257  150 - 400 K/uL   Neutrophils Relative % 88 (*) 43 - 77 %   Neutro Abs 16.0 (*) 1.7 - 7.7 K/uL   Lymphocytes Relative 7 (*) 12 - 46 %   Lymphs Abs 1.3  0.7 - 4.0 K/uL   Monocytes Relative 4  3 - 12 %   Monocytes Absolute 0.8  0.1 - 1.0 K/uL   Eosinophils Relative 0  0 - 5 %   Eosinophils Absolute 0.0  0.0 - 0.7 K/uL   Basophils Relative 0  0 - 1 %   Basophils Absolute 0.0  0.0 - 0.1 K/uL  BASIC METABOLIC PANEL     Status: Abnormal   Collection Time    06/02/13  6:40 PM      Result Value Range   Sodium 134 (*) 135 - 145 mEq/L   Potassium 3.6  3.5 - 5.1 mEq/L   Chloride  99  96 - 112 mEq/L   CO2 22  19 - 32 mEq/L   Glucose, Bld 88  70 - 99 mg/dL   BUN 10  6 - 23 mg/dL   Creatinine, Ser 0.62  0.50 - 1.10 mg/dL   Calcium 8.9  8.4 - 10.5 mg/dL   GFR calc non Af Amer >90  >90 mL/min   GFR calc Af Amer >90  >90 mL/min   Comment: (NOTE)     The eGFR has been calculated using the CKD EPI equation.     This calculation has not been validated in all clinical situations.     eGFR's persistently <90 mL/min signify possible Chronic Kidney     Disease.  URINALYSIS, ROUTINE W REFLEX MICROSCOPIC     Status: None   Collection Time    06/02/13 10:53 PM  Result Value Range   Color, Urine YELLOW  YELLOW   APPearance CLEAR  CLEAR   Specific Gravity, Urine 1.010  1.005 - 1.030   pH 6.0  5.0 - 8.0   Glucose, UA NEGATIVE  NEGATIVE mg/dL   Hgb urine dipstick NEGATIVE  NEGATIVE   Bilirubin Urine NEGATIVE  NEGATIVE   Ketones, ur NEGATIVE  NEGATIVE mg/dL   Protein, ur NEGATIVE  NEGATIVE mg/dL   Urobilinogen, UA 0.2  0.0 - 1.0 mg/dL   Nitrite NEGATIVE  NEGATIVE   Leukocytes, UA NEGATIVE  NEGATIVE   Comment: MICROSCOPIC NOT DONE ON URINES WITH NEGATIVE PROTEIN, BLOOD, LEUKOCYTES, NITRITE, OR GLUCOSE <1000 mg/dL.    Dg Wrist Complete Left  06/02/2013   *RADIOLOGY REPORT*  Clinical Data: Fall, left wrist pain  LEFT WRIST - COMPLETE 3+ VIEW  Comparison: None.  Findings: Four views of the left wrist submitted.  Study is limited by diffuse osteopenia.  There is nondisplaced mild impacted fracture in distal left radial metaphysis.  On lateral view the fracture line is seen involving the distal articular surface of the radius. Degenerative changes first carpal metacarpal joint.  IMPRESSION: There is nondisplaced mild impacted fracture in distal left radial metaphysis.  On lateral view the fracture line is seen involving the distal articular surface of the radius.   Original Report Authenticated By: Lahoma Crocker, M.D.   Dg Ankle Complete Right  06/02/2013   *RADIOLOGY REPORT*   Clinical Data: Fall  RIGHT ANKLE - COMPLETE 3+ VIEW  Comparison: None.  Findings: Three views of the right ankle submitted.  Study is limited by diffuse osteopenia.  Ankle mortise is preserved.  No ankle fracture.  There is nondisplaced fracture mid aspect of the calcaneus.  IMPRESSION: No ankle fracture.  Nondisplaced fracture  of the calcaneus.  Soft tissue swelling in hind foot.   Original Report Authenticated By: Lahoma Crocker, M.D.   Ct Chest W Contrast  06/02/2013   *RADIOLOGY REPORT*  Clinical Data:  67 year old female status post fall from 6 feet ladder.  Pain.  CT CHEST, ABDOMEN AND PELVIS WITH CONTRAST  Technique:  Multidetector CT imaging of the chest, abdomen and pelvis was performed following the standard protocol during bolus administration of intravenous contrast.  Contrast:  100 ml Omnipaque-300.  Comparison:  [facility] chest radiographs 07/28/2009.  CT CHEST  Findings:  Severe upper lobe predominant centrilobular emphysema. No pneumothorax. Mild dependent pulmonary opacity in both lungs most resembling atelectasis.  No definite pleural effusion.  Osteopenia.  No rib fracture identified.  Sternum intact.  Thoracic spine is reported below.  Negative thoracic inlet.  No mediastinal hematoma.  No pericardial effusion.  Thoracic aorta appears intact with atherosclerosis. Mild enlargement of the central pulmonary arteries.  No mediastinal lymphadenopathy.  No axillary lymphadenopathy.  IMPRESSION:  1.  See thoracic spine injury described in the dedicated spine report below.  2.  No other acute traumatic injury identified in the chest. Emphysema and mild atelectasis.  3.  Abdomen, pelvis, and spine findings are below.  CT ABDOMEN AND PELVIS  Findings:  Lumbar spine findings are reported below.  Osteopenia.  Sacrum and SI joints intact.  No acute pelvis fracture identified.  Proximal femurs intact.  No pelvic free fluid.  Nondilated large and small bowel loops in the pelvis.  Mildly  distended bladder.  Uterus and adnexa appear to be surgically absent.  No dilated bowel loops.  Redundant colon.  Stool and fluid in the proximal colon.  Decompressed stomach and  duodenum.  No liver injury identified.  Mildly decreased liver density throughout suggesting a degree of steatosis, and small sub centimeter left lobe hypodense areas which probably are benign cysts or biliary hamartomas.  Intact spleen with occasional calcified granulomas.  Negative pancreas, adrenal glands, and gallbladder.  Portal venous system appears grossly normal.  Widespread atherosclerosis of the aorta and its branches.  Major arterial structures in the abdomen and pelvis are patent.  No abdominal free fluid.  No lymphadenopathy identified.  IMPRESSION:   1. No acute traumatic injury identified in the abdomen and pelvis.  2.  See thoracic and lumbar spine findings below.   CT THORACIC SPINE WITHOUT CONTRAST  Technique: Multidetector CT imaging of the thoracic spine was performed without intravenous contrast administration. Multiplanar CT image reconstructions were also generated.  Findings:  Osteopenia.  Mild anterolisthesis of C7 on T1 appears to be degenerative and is associated with bilateral facet hypertrophy.  Similar mild anterolisthesis of T2-9 T3 appears to be degenerative and associated with facet hypertrophy.  Severe T12 vertebral body fracture, severely comminuted with retropulsion of the posterior-superior endplate up to 6 mm.  This mildly narrows the AP spinal canal dimension 210-11 mm.  The pedicles appear intact along with the remaining T12 posterior elements.  Minimal to mild paraspinal edema/stranding.  No CT evidence of epidural hemorrhage.  Mild T7 superior endplate deformity appears to be chronic and probably degenerative. Other thoracic vertebrae are intact. Posterior ribs are intact.  The L1 vertebrae is intact.  See additional lumbar details below.  IMPRESSION:   1.  Severely comminuted T12 vertebral body  fracture, with a severe compression versus mild burst configuration.  Some retropulsion of the posterior-superior endplate, but with relatively well maintained patency of the spinal canal at this level (AP dimension and 10 mm).  T12 posterior elements appear intact.  2.  Osteopenia.  No other acute thoracic spine fracture identified. Mild degenerative anterolisthesis at C7-T1 and T2-T3.  3.  Lumbar spine findings are below.  CT LUMBAR SPINE WITHOUT CONTRAST  Technique:  Multidetector CT imaging of the lumbar spine was performed without intravenous contrast administration.  Multiplanar CT image reconstructions were also generated.  Findings:  Normal lumbar segmentation.  T12 fracture as detailed above.  Lumbar vertebrae intact.  Lumbar vertebral height and alignment normal except for trace anterolisthesis of L4 on L5, with associated moderate to severe facet degeneration at that level. Visible sacrum appears intact. Visualized paraspinal soft tissues are within normal limits.  Advanced L3-L4 disc degeneration with vacuum disc phenomena. Superimposed posterior element hypertrophy.  This level and the L3- L4 level demonstrate multifactorial moderate to severe spinal stenosis.  IMPRESSION:  1.  No acute traumatic injury in the lumbar spine.  2.  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  Study discussed by telephone with Dr. Dina Rich on 06/02/2013 at 2229 hours.   Original Report Authenticated By: Roselyn Reef, M.D.   Ct Lumbar Spine Wo Contrast  06/02/2013   *RADIOLOGY REPORT*  Clinical Data:  67 year old female status post fall from 6 feet ladder.  Pain.  CT CHEST, ABDOMEN AND PELVIS WITH CONTRAST  Technique:  Multidetector CT imaging of the chest, abdomen and pelvis was performed following the standard protocol during bolus administration of intravenous contrast.  Contrast:  100 ml Omnipaque-300.  Comparison:  [facility] chest radiographs 07/28/2009.  CT CHEST  Findings:  Severe  upper lobe predominant centrilobular emphysema. No pneumothorax. Mild dependent pulmonary opacity in both lungs most resembling atelectasis.  No definite pleural effusion.  Osteopenia.  No rib fracture identified.  Sternum intact.  Thoracic spine is reported below.  Negative thoracic inlet.  No mediastinal hematoma.  No pericardial effusion.  Thoracic aorta appears intact with atherosclerosis. Mild enlargement of the central pulmonary arteries.  No mediastinal lymphadenopathy.  No axillary lymphadenopathy.  IMPRESSION:  1.  See thoracic spine injury described in the dedicated spine report below.  2.  No other acute traumatic injury identified in the chest. Emphysema and mild atelectasis.  3.  Abdomen, pelvis, and spine findings are below.  CT ABDOMEN AND PELVIS  Findings:  Lumbar spine findings are reported below.  Osteopenia.  Sacrum and SI joints intact.  No acute pelvis fracture identified.  Proximal femurs intact.  No pelvic free fluid.  Nondilated large and small bowel loops in the pelvis.  Mildly distended bladder.  Uterus and adnexa appear to be surgically absent.  No dilated bowel loops.  Redundant colon.  Stool and fluid in the proximal colon.  Decompressed stomach and duodenum.  No liver injury identified.  Mildly decreased liver density throughout suggesting a degree of steatosis, and small sub centimeter left lobe hypodense areas which probably are benign cysts or biliary hamartomas.  Intact spleen with occasional calcified granulomas.  Negative pancreas, adrenal glands, and gallbladder.  Portal venous system appears grossly normal.  Widespread atherosclerosis of the aorta and its branches.  Major arterial structures in the abdomen and pelvis are patent.  No abdominal free fluid.  No lymphadenopathy identified.  IMPRESSION:   1. No acute traumatic injury identified in the abdomen and pelvis.  2.  See thoracic and lumbar spine findings below.   CT THORACIC SPINE WITHOUT CONTRAST  Technique: Multidetector  CT imaging of the thoracic spine was performed without intravenous contrast administration. Multiplanar CT image reconstructions were also generated.  Findings:  Osteopenia.  Mild anterolisthesis of C7 on T1 appears to be degenerative and is associated with bilateral facet hypertrophy.  Similar mild anterolisthesis of T2-9 T3 appears to be degenerative and associated with facet hypertrophy.  Severe T12 vertebral body fracture, severely comminuted with retropulsion of the posterior-superior endplate up to 6 mm.  This mildly narrows the AP spinal canal dimension 210-11 mm.  The pedicles appear intact along with the remaining T12 posterior elements.  Minimal to mild paraspinal edema/stranding.  No CT evidence of epidural hemorrhage.  Mild T7 superior endplate deformity appears to be chronic and probably degenerative. Other thoracic vertebrae are intact. Posterior ribs are intact.  The L1 vertebrae is intact.  See additional lumbar details below.  IMPRESSION:   1.  Severely comminuted T12 vertebral body fracture, with a severe compression versus mild burst configuration.  Some retropulsion of the posterior-superior endplate, but with relatively well maintained patency of the spinal canal at this level (AP dimension and 10 mm).  T12 posterior elements appear intact.  2.  Osteopenia.  No other acute thoracic spine fracture identified. Mild degenerative anterolisthesis at C7-T1 and T2-T3.  3.  Lumbar spine findings are below.  CT LUMBAR SPINE WITHOUT CONTRAST  Technique:  Multidetector CT imaging of the lumbar spine was performed without intravenous contrast administration.  Multiplanar CT image reconstructions were also generated.  Findings:  Normal lumbar segmentation.  T12 fracture as detailed above.  Lumbar vertebrae intact.  Lumbar vertebral height and alignment normal except for trace anterolisthesis of L4 on L5, with associated moderate to severe facet degeneration at that level. Visible sacrum appears intact.  Visualized paraspinal soft tissues are within  normal limits.  Advanced L3-L4 disc degeneration with vacuum disc phenomena. Superimposed posterior element hypertrophy.  This level and the L3- L4 level demonstrate multifactorial moderate to severe spinal stenosis.  IMPRESSION:  1.  No acute traumatic injury in the lumbar spine.  2.  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  Study discussed by telephone with Dr. Dina Rich on 06/02/2013 at 2229 hours.   Original Report Authenticated By: Roselyn Reef, M.D.   Ct Abdomen Pelvis W Contrast  06/02/2013   *RADIOLOGY REPORT*  Clinical Data:  67 year old female status post fall from 6 feet ladder.  Pain.  CT CHEST, ABDOMEN AND PELVIS WITH CONTRAST  Technique:  Multidetector CT imaging of the chest, abdomen and pelvis was performed following the standard protocol during bolus administration of intravenous contrast.  Contrast:  100 ml Omnipaque-300.  Comparison:  [facility] chest radiographs 07/28/2009.  CT CHEST  Findings:  Severe upper lobe predominant centrilobular emphysema. No pneumothorax. Mild dependent pulmonary opacity in both lungs most resembling atelectasis.  No definite pleural effusion.  Osteopenia.  No rib fracture identified.  Sternum intact.  Thoracic spine is reported below.  Negative thoracic inlet.  No mediastinal hematoma.  No pericardial effusion.  Thoracic aorta appears intact with atherosclerosis. Mild enlargement of the central pulmonary arteries.  No mediastinal lymphadenopathy.  No axillary lymphadenopathy.  IMPRESSION:  1.  See thoracic spine injury described in the dedicated spine report below.  2.  No other acute traumatic injury identified in the chest. Emphysema and mild atelectasis.  3.  Abdomen, pelvis, and spine findings are below.  CT ABDOMEN AND PELVIS  Findings:  Lumbar spine findings are reported below.  Osteopenia.  Sacrum and SI joints intact.  No acute pelvis fracture identified.  Proximal femurs  intact.  No pelvic free fluid.  Nondilated large and small bowel loops in the pelvis.  Mildly distended bladder.  Uterus and adnexa appear to be surgically absent.  No dilated bowel loops.  Redundant colon.  Stool and fluid in the proximal colon.  Decompressed stomach and duodenum.  No liver injury identified.  Mildly decreased liver density throughout suggesting a degree of steatosis, and small sub centimeter left lobe hypodense areas which probably are benign cysts or biliary hamartomas.  Intact spleen with occasional calcified granulomas.  Negative pancreas, adrenal glands, and gallbladder.  Portal venous system appears grossly normal.  Widespread atherosclerosis of the aorta and its branches.  Major arterial structures in the abdomen and pelvis are patent.  No abdominal free fluid.  No lymphadenopathy identified.  IMPRESSION:   1. No acute traumatic injury identified in the abdomen and pelvis.  2.  See thoracic and lumbar spine findings below.   CT THORACIC SPINE WITHOUT CONTRAST  Technique: Multidetector CT imaging of the thoracic spine was performed without intravenous contrast administration. Multiplanar CT image reconstructions were also generated.  Findings:  Osteopenia.  Mild anterolisthesis of C7 on T1 appears to be degenerative and is associated with bilateral facet hypertrophy.  Similar mild anterolisthesis of T2-9 T3 appears to be degenerative and associated with facet hypertrophy.  Severe T12 vertebral body fracture, severely comminuted with retropulsion of the posterior-superior endplate up to 6 mm.  This mildly narrows the AP spinal canal dimension 210-11 mm.  The pedicles appear intact along with the remaining T12 posterior elements.  Minimal to mild paraspinal edema/stranding.  No CT evidence of epidural hemorrhage.  Mild T7 superior endplate deformity appears to be chronic and probably degenerative. Other thoracic vertebrae are intact.  Posterior ribs are intact.  The L1 vertebrae is intact.  See  additional lumbar details below.  IMPRESSION:   1.  Severely comminuted T12 vertebral body fracture, with a severe compression versus mild burst configuration.  Some retropulsion of the posterior-superior endplate, but with relatively well maintained patency of the spinal canal at this level (AP dimension and 10 mm).  T12 posterior elements appear intact.  2.  Osteopenia.  No other acute thoracic spine fracture identified. Mild degenerative anterolisthesis at C7-T1 and T2-T3.  3.  Lumbar spine findings are below.  CT LUMBAR SPINE WITHOUT CONTRAST  Technique:  Multidetector CT imaging of the lumbar spine was performed without intravenous contrast administration.  Multiplanar CT image reconstructions were also generated.  Findings:  Normal lumbar segmentation.  T12 fracture as detailed above.  Lumbar vertebrae intact.  Lumbar vertebral height and alignment normal except for trace anterolisthesis of L4 on L5, with associated moderate to severe facet degeneration at that level. Visible sacrum appears intact. Visualized paraspinal soft tissues are within normal limits.  Advanced L3-L4 disc degeneration with vacuum disc phenomena. Superimposed posterior element hypertrophy.  This level and the L3- L4 level demonstrate multifactorial moderate to severe spinal stenosis.  IMPRESSION:  1.  No acute traumatic injury in the lumbar spine.  2.  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  Study discussed by telephone with Dr. Dina Rich on 06/02/2013 at 2229 hours.   Original Report Authenticated By: Roselyn Reef, M.D.   Dg Foot Complete Right  06/02/2013   *RADIOLOGY REPORT*  Clinical Data: 67 year old female status post fall with pain.  RIGHT FOOT COMPLETE - 3+ VIEW  Comparison: None.  Findings: Comminuted but fairly nondisplaced at this time fracture of the calcaneus (see lateral view).  Small ossific fragment lateral to the cuboid is favored to be an accessory ossicle and not the sequelae of trauma.  Tarsal  bones other than the calcaneus appear intact.  Metatarsals and phalanges appear intact.  IMPRESSION: 1.  Comminuted fracture of the calcaneus, fairly nondisplaced at this time. 2.  No additional acute fracture or dislocation identified in the right foot.   Original Report Authenticated By: Roselyn Reef, M.D.   Ct Extrem Lower Wo Cm Bil  06/02/2013   *RADIOLOGY REPORT*  Clinical Data:  Fall, calcaneal fracture  CT BILATERAL FEET WITH AND WITHOUT CONTRAST  Technique:  Multidetector CT imaging of the bilateral feet was performed according to the standard protocol before and following intravenous contrast administration. Multiplanar CT image reconstructions were also generated.  Comparison:  Conventional radiographs obtained earlier today at 1912 hours  Findings:  Cross-sectional imaging to the bilateral feet demonstrates a comminuted fracture through the body of the right calcaneus.  There is minimal displacement and no significant depression.  Incidental note is made of bilateral os tibialis externa, os trigonum and os peroneus.  No definite fracture of the left calcaneus. No focal soft tissue abnormality.  IMPRESSION:  Comminuted, nondisplaced fracture through the body of the right calcaneus.  No significant height loss or bony compaction.   Original Report Authenticated By: Jacqulynn Cadet, M.D.    Review of Systems  Constitutional: Negative.   HENT: Negative.   Eyes: Negative.   Respiratory: Positive for cough.   Cardiovascular: Negative.   Gastrointestinal: Negative.   Genitourinary: Negative.   Musculoskeletal: Positive for back pain.       Right leg and left arm pain. Cast in both places  Skin: Negative.   Neurological: Negative.   Endo/Heme/Allergies: Negative.  Psychiatric/Behavioral: Negative.    Blood pressure 127/68, pulse 80, temperature 98.4 F (36.9 C), temperature source Oral, resp. rate 18, SpO2 91.00%. Physical Exam hent, nl. Neck, nl. Cv, nl. lungc some mild ronchii.  Abdomen,soft. Extremities casts in right leg and left arm. NEURO oriented x3. No weakness, sensory norma, ct shows fracture of t 12 witn open canal.   Assessment/Plan: Patient to get a tlso for inmobilization and pain control. Will f/u her and decide about further treatment.ortho and trauma to see.  Kayla Mack M 06/03/2013, 12:00 AM

## 2013-06-03 NOTE — Progress Notes (Signed)
Orthopedic Tech Progress Note Patient Details:  CERENITY GOSHORN 1946-10-09 [id number]  Patient ID: Marletta Lor, female   DOB: 1946/01/14, 67 y.o.   MRN: [id number] Brace order completed by Warnell Forester, Santrice Muzio 06/03/2013, 3:12 PM

## 2013-06-03 NOTE — Progress Notes (Signed)
Patient ID: Kayla Mack, female   DOB: Jul 06, 1946, 67 y.o.   MRN: [id number] Still c/o of lbp. tlso today. Can be oob with help

## 2013-06-03 NOTE — H&P (Signed)
History   Kayla Mack is an 67 y.o. female.   Chief Complaint:  Chief Complaint  Patient presents with  . Fall    Fall  Back Pain Location:  Lumbar spine Quality:  Shooting and aching Pain severity:  Severe Pain is:  Same all the time Onset quality:  Sudden Timing:  Constant Progression:  Unchanged Chronicity:  New Context: falling     Past Medical History  Diagnosis Date  . Depression   . Hypertension   . Anxiety   . Hyperlipidemia     Past Surgical History  Procedure Laterality Date  . Appendectomy    . Tonsillectomy    . Shoulder surgery Right Pin    No family history on file. Social History:  reports that she has been smoking.  She does not have any smokeless tobacco history on file. She reports that  drinks alcohol. She reports that she does not use illicit drugs.  Allergies   Allergies  Allergen Reactions  . Aleve [Naproxen Sodium] Hives  . Aspirin Other (See Comments)    Facial swelling  . Penicillins Hives    Home Medications   (Not in a hospital admission)  Trauma Course   Results for orders placed during the hospital encounter of 06/02/13 (from the past 48 hour(s))  CBC WITH DIFFERENTIAL     Status: Abnormal   Collection Time    06/02/13  6:40 PM      Result Value Range   WBC 18.1 (*) 4.0 - 10.5 K/uL   RBC 4.07  3.87 - 5.11 MIL/uL   Hemoglobin 13.5  12.0 - 15.0 g/dL   HCT 38.0  36.0 - 46.0 %   MCV 93.4  78.0 - 100.0 fL   MCH 33.2  26.0 - 34.0 pg   MCHC 35.5  30.0 - 36.0 g/dL   RDW 12.9  11.5 - 15.5 %   Platelets 257  150 - 400 K/uL   Neutrophils Relative % 88 (*) 43 - 77 %   Neutro Abs 16.0 (*) 1.7 - 7.7 K/uL   Lymphocytes Relative 7 (*) 12 - 46 %   Lymphs Abs 1.3  0.7 - 4.0 K/uL   Monocytes Relative 4  3 - 12 %   Monocytes Absolute 0.8  0.1 - 1.0 K/uL   Eosinophils Relative 0  0 - 5 %   Eosinophils Absolute 0.0  0.0 - 0.7 K/uL   Basophils Relative 0  0 - 1 %   Basophils Absolute 0.0  0.0 - 0.1 K/uL  BASIC METABOLIC  PANEL     Status: Abnormal   Collection Time    06/02/13  6:40 PM      Result Value Range   Sodium 134 (*) 135 - 145 mEq/L   Potassium 3.6  3.5 - 5.1 mEq/L   Chloride 99  96 - 112 mEq/L   CO2 22  19 - 32 mEq/L   Glucose, Bld 88  70 - 99 mg/dL   BUN 10  6 - 23 mg/dL   Creatinine, Ser 0.62  0.50 - 1.10 mg/dL   Calcium 8.9  8.4 - 10.5 mg/dL   GFR calc non Af Amer >90  >90 mL/min   GFR calc Af Amer >90  >90 mL/min   Comment: (NOTE)     The eGFR has been calculated using the CKD EPI equation.     This calculation has not been validated in all clinical situations.     eGFR's persistently <90 mL/min signify  possible Chronic Kidney     Disease.  URINALYSIS, ROUTINE W REFLEX MICROSCOPIC     Status: None   Collection Time    06/02/13 10:53 PM      Result Value Range   Color, Urine YELLOW  YELLOW   APPearance CLEAR  CLEAR   Specific Gravity, Urine 1.010  1.005 - 1.030   pH 6.0  5.0 - 8.0   Glucose, UA NEGATIVE  NEGATIVE mg/dL   Hgb urine dipstick NEGATIVE  NEGATIVE   Bilirubin Urine NEGATIVE  NEGATIVE   Ketones, ur NEGATIVE  NEGATIVE mg/dL   Protein, ur NEGATIVE  NEGATIVE mg/dL   Urobilinogen, UA 0.2  0.0 - 1.0 mg/dL   Nitrite NEGATIVE  NEGATIVE   Leukocytes, UA NEGATIVE  NEGATIVE   Comment: MICROSCOPIC NOT DONE ON URINES WITH NEGATIVE PROTEIN, BLOOD, LEUKOCYTES, NITRITE, OR GLUCOSE <1000 mg/dL.   Dg Wrist Complete Left  06/02/2013   *RADIOLOGY REPORT*  Clinical Data: Fall, left wrist pain  LEFT WRIST - COMPLETE 3+ VIEW  Comparison: None.  Findings: Four views of the left wrist submitted.  Study is limited by diffuse osteopenia.  There is nondisplaced mild impacted fracture in distal left radial metaphysis.  On lateral view the fracture line is seen involving the distal articular surface of the radius. Degenerative changes first carpal metacarpal joint.  IMPRESSION: There is nondisplaced mild impacted fracture in distal left radial metaphysis.  On lateral view the fracture line is seen  involving the distal articular surface of the radius.   Original Report Authenticated By: Lahoma Crocker, M.D.   Dg Ankle Complete Right  06/02/2013   *RADIOLOGY REPORT*  Clinical Data: Fall  RIGHT ANKLE - COMPLETE 3+ VIEW  Comparison: None.  Findings: Three views of the right ankle submitted.  Study is limited by diffuse osteopenia.  Ankle mortise is preserved.  No ankle fracture.  There is nondisplaced fracture mid aspect of the calcaneus.  IMPRESSION: No ankle fracture.  Nondisplaced fracture  of the calcaneus.  Soft tissue swelling in hind foot.   Original Report Authenticated By: Lahoma Crocker, M.D.   Ct Chest W Contrast  06/02/2013   *RADIOLOGY REPORT*  Clinical Data:  67 year old female status post fall from 6 feet ladder.  Pain.  CT CHEST, ABDOMEN AND PELVIS WITH CONTRAST  Technique:  Multidetector CT imaging of the chest, abdomen and pelvis was performed following the standard protocol during bolus administration of intravenous contrast.  Contrast:  100 ml Omnipaque-300.  Comparison:  [facility] chest radiographs 07/28/2009.  CT CHEST  Findings:  Severe upper lobe predominant centrilobular emphysema. No pneumothorax. Mild dependent pulmonary opacity in both lungs most resembling atelectasis.  No definite pleural effusion.  Osteopenia.  No rib fracture identified.  Sternum intact.  Thoracic spine is reported below.  Negative thoracic inlet.  No mediastinal hematoma.  No pericardial effusion.  Thoracic aorta appears intact with atherosclerosis. Mild enlargement of the central pulmonary arteries.  No mediastinal lymphadenopathy.  No axillary lymphadenopathy.  IMPRESSION:  1.  See thoracic spine injury described in the dedicated spine report below.  2.  No other acute traumatic injury identified in the chest. Emphysema and mild atelectasis.  3.  Abdomen, pelvis, and spine findings are below.  CT ABDOMEN AND PELVIS  Findings:  Lumbar spine findings are reported below.  Osteopenia.  Sacrum and SI  joints intact.  No acute pelvis fracture identified.  Proximal femurs intact.  No pelvic free fluid.  Nondilated large and small bowel loops in the  pelvis.  Mildly distended bladder.  Uterus and adnexa appear to be surgically absent.  No dilated bowel loops.  Redundant colon.  Stool and fluid in the proximal colon.  Decompressed stomach and duodenum.  No liver injury identified.  Mildly decreased liver density throughout suggesting a degree of steatosis, and small sub centimeter left lobe hypodense areas which probably are benign cysts or biliary hamartomas.  Intact spleen with occasional calcified granulomas.  Negative pancreas, adrenal glands, and gallbladder.  Portal venous system appears grossly normal.  Widespread atherosclerosis of the aorta and its branches.  Major arterial structures in the abdomen and pelvis are patent.  No abdominal free fluid.  No lymphadenopathy identified.  IMPRESSION:   1. No acute traumatic injury identified in the abdomen and pelvis.  2.  See thoracic and lumbar spine findings below.   CT THORACIC SPINE WITHOUT CONTRAST  Technique: Multidetector CT imaging of the thoracic spine was performed without intravenous contrast administration. Multiplanar CT image reconstructions were also generated.  Findings:  Osteopenia.  Mild anterolisthesis of C7 on T1 appears to be degenerative and is associated with bilateral facet hypertrophy.  Similar mild anterolisthesis of T2-9 T3 appears to be degenerative and associated with facet hypertrophy.  Severe T12 vertebral body fracture, severely comminuted with retropulsion of the posterior-superior endplate up to 6 mm.  This mildly narrows the AP spinal canal dimension 210-11 mm.  The pedicles appear intact along with the remaining T12 posterior elements.  Minimal to mild paraspinal edema/stranding.  No CT evidence of epidural hemorrhage.  Mild T7 superior endplate deformity appears to be chronic and probably degenerative. Other thoracic vertebrae are  intact. Posterior ribs are intact.  The L1 vertebrae is intact.  See additional lumbar details below.  IMPRESSION:   1.  Severely comminuted T12 vertebral body fracture, with a severe compression versus mild burst configuration.  Some retropulsion of the posterior-superior endplate, but with relatively well maintained patency of the spinal canal at this level (AP dimension and 10 mm).  T12 posterior elements appear intact.  2.  Osteopenia.  No other acute thoracic spine fracture identified. Mild degenerative anterolisthesis at C7-T1 and T2-T3.  3.  Lumbar spine findings are below.  CT LUMBAR SPINE WITHOUT CONTRAST  Technique:  Multidetector CT imaging of the lumbar spine was performed without intravenous contrast administration.  Multiplanar CT image reconstructions were also generated.  Findings:  Normal lumbar segmentation.  T12 fracture as detailed above.  Lumbar vertebrae intact.  Lumbar vertebral height and alignment normal except for trace anterolisthesis of L4 on L5, with associated moderate to severe facet degeneration at that level. Visible sacrum appears intact. Visualized paraspinal soft tissues are within normal limits.  Advanced L3-L4 disc degeneration with vacuum disc phenomena. Superimposed posterior element hypertrophy.  This level and the L3- L4 level demonstrate multifactorial moderate to severe spinal stenosis.  IMPRESSION:  1.  No acute traumatic injury in the lumbar spine.  2.  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  Study discussed by telephone with Dr. Dina Rich on 06/02/2013 at 2229 hours.   Original Report Authenticated By: Roselyn Reef, M.D.   Ct Lumbar Spine Wo Contrast  06/02/2013   *RADIOLOGY REPORT*  Clinical Data:  67 year old female status post fall from 6 feet ladder.  Pain.  CT CHEST, ABDOMEN AND PELVIS WITH CONTRAST  Technique:  Multidetector CT imaging of the chest, abdomen and pelvis was performed following the standard protocol during bolus administration of  intravenous contrast.  Contrast:  100 ml Omnipaque-300.  Comparison:  [facility] chest radiographs 07/28/2009.  CT CHEST  Findings:  Severe upper lobe predominant centrilobular emphysema. No pneumothorax. Mild dependent pulmonary opacity in both lungs most resembling atelectasis.  No definite pleural effusion.  Osteopenia.  No rib fracture identified.  Sternum intact.  Thoracic spine is reported below.  Negative thoracic inlet.  No mediastinal hematoma.  No pericardial effusion.  Thoracic aorta appears intact with atherosclerosis. Mild enlargement of the central pulmonary arteries.  No mediastinal lymphadenopathy.  No axillary lymphadenopathy.  IMPRESSION:  1.  See thoracic spine injury described in the dedicated spine report below.  2.  No other acute traumatic injury identified in the chest. Emphysema and mild atelectasis.  3.  Abdomen, pelvis, and spine findings are below.  CT ABDOMEN AND PELVIS  Findings:  Lumbar spine findings are reported below.  Osteopenia.  Sacrum and SI joints intact.  No acute pelvis fracture identified.  Proximal femurs intact.  No pelvic free fluid.  Nondilated large and small bowel loops in the pelvis.  Mildly distended bladder.  Uterus and adnexa appear to be surgically absent.  No dilated bowel loops.  Redundant colon.  Stool and fluid in the proximal colon.  Decompressed stomach and duodenum.  No liver injury identified.  Mildly decreased liver density throughout suggesting a degree of steatosis, and small sub centimeter left lobe hypodense areas which probably are benign cysts or biliary hamartomas.  Intact spleen with occasional calcified granulomas.  Negative pancreas, adrenal glands, and gallbladder.  Portal venous system appears grossly normal.  Widespread atherosclerosis of the aorta and its branches.  Major arterial structures in the abdomen and pelvis are patent.  No abdominal free fluid.  No lymphadenopathy identified.  IMPRESSION:   1. No acute traumatic  injury identified in the abdomen and pelvis.  2.  See thoracic and lumbar spine findings below.   CT THORACIC SPINE WITHOUT CONTRAST  Technique: Multidetector CT imaging of the thoracic spine was performed without intravenous contrast administration. Multiplanar CT image reconstructions were also generated.  Findings:  Osteopenia.  Mild anterolisthesis of C7 on T1 appears to be degenerative and is associated with bilateral facet hypertrophy.  Similar mild anterolisthesis of T2-9 T3 appears to be degenerative and associated with facet hypertrophy.  Severe T12 vertebral body fracture, severely comminuted with retropulsion of the posterior-superior endplate up to 6 mm.  This mildly narrows the AP spinal canal dimension 210-11 mm.  The pedicles appear intact along with the remaining T12 posterior elements.  Minimal to mild paraspinal edema/stranding.  No CT evidence of epidural hemorrhage.  Mild T7 superior endplate deformity appears to be chronic and probably degenerative. Other thoracic vertebrae are intact. Posterior ribs are intact.  The L1 vertebrae is intact.  See additional lumbar details below.  IMPRESSION:   1.  Severely comminuted T12 vertebral body fracture, with a severe compression versus mild burst configuration.  Some retropulsion of the posterior-superior endplate, but with relatively well maintained patency of the spinal canal at this level (AP dimension and 10 mm).  T12 posterior elements appear intact.  2.  Osteopenia.  No other acute thoracic spine fracture identified. Mild degenerative anterolisthesis at C7-T1 and T2-T3.  3.  Lumbar spine findings are below.  CT LUMBAR SPINE WITHOUT CONTRAST  Technique:  Multidetector CT imaging of the lumbar spine was performed without intravenous contrast administration.  Multiplanar CT image reconstructions were also generated.  Findings:  Normal lumbar segmentation.  T12 fracture as detailed above.  Lumbar vertebrae intact.  Lumbar vertebral  height and alignment  normal except for trace anterolisthesis of L4 on L5, with associated moderate to severe facet degeneration at that level. Visible sacrum appears intact. Visualized paraspinal soft tissues are within normal limits.  Advanced L3-L4 disc degeneration with vacuum disc phenomena. Superimposed posterior element hypertrophy.  This level and the L3- L4 level demonstrate multifactorial moderate to severe spinal stenosis.  IMPRESSION:  1.  No acute traumatic injury in the lumbar spine.  2.  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  Study discussed by telephone with Dr. Dina Rich on 06/02/2013 at 2229 hours.   Original Report Authenticated By: Roselyn Reef, M.D.   Ct Abdomen Pelvis W Contrast  06/02/2013   *RADIOLOGY REPORT*  Clinical Data:  67 year old female status post fall from 6 feet ladder.  Pain.  CT CHEST, ABDOMEN AND PELVIS WITH CONTRAST  Technique:  Multidetector CT imaging of the chest, abdomen and pelvis was performed following the standard protocol during bolus administration of intravenous contrast.  Contrast:  100 ml Omnipaque-300.  Comparison:  [facility] chest radiographs 07/28/2009.  CT CHEST  Findings:  Severe upper lobe predominant centrilobular emphysema. No pneumothorax. Mild dependent pulmonary opacity in both lungs most resembling atelectasis.  No definite pleural effusion.  Osteopenia.  No rib fracture identified.  Sternum intact.  Thoracic spine is reported below.  Negative thoracic inlet.  No mediastinal hematoma.  No pericardial effusion.  Thoracic aorta appears intact with atherosclerosis. Mild enlargement of the central pulmonary arteries.  No mediastinal lymphadenopathy.  No axillary lymphadenopathy.  IMPRESSION:  1.  See thoracic spine injury described in the dedicated spine report below.  2.  No other acute traumatic injury identified in the chest. Emphysema and mild atelectasis.  3.  Abdomen, pelvis, and spine findings are below.  CT ABDOMEN AND PELVIS   Findings:  Lumbar spine findings are reported below.  Osteopenia.  Sacrum and SI joints intact.  No acute pelvis fracture identified.  Proximal femurs intact.  No pelvic free fluid.  Nondilated large and small bowel loops in the pelvis.  Mildly distended bladder.  Uterus and adnexa appear to be surgically absent.  No dilated bowel loops.  Redundant colon.  Stool and fluid in the proximal colon.  Decompressed stomach and duodenum.  No liver injury identified.  Mildly decreased liver density throughout suggesting a degree of steatosis, and small sub centimeter left lobe hypodense areas which probably are benign cysts or biliary hamartomas.  Intact spleen with occasional calcified granulomas.  Negative pancreas, adrenal glands, and gallbladder.  Portal venous system appears grossly normal.  Widespread atherosclerosis of the aorta and its branches.  Major arterial structures in the abdomen and pelvis are patent.  No abdominal free fluid.  No lymphadenopathy identified.  IMPRESSION:   1. No acute traumatic injury identified in the abdomen and pelvis.  2.  See thoracic and lumbar spine findings below.   CT THORACIC SPINE WITHOUT CONTRAST  Technique: Multidetector CT imaging of the thoracic spine was performed without intravenous contrast administration. Multiplanar CT image reconstructions were also generated.  Findings:  Osteopenia.  Mild anterolisthesis of C7 on T1 appears to be degenerative and is associated with bilateral facet hypertrophy.  Similar mild anterolisthesis of T2-9 T3 appears to be degenerative and associated with facet hypertrophy.  Severe T12 vertebral body fracture, severely comminuted with retropulsion of the posterior-superior endplate up to 6 mm.  This mildly narrows the AP spinal canal dimension 210-11 mm.  The pedicles appear intact along with the remaining T12  posterior elements.  Minimal to mild paraspinal edema/stranding.  No CT evidence of epidural hemorrhage.  Mild T7 superior endplate  deformity appears to be chronic and probably degenerative. Other thoracic vertebrae are intact. Posterior ribs are intact.  The L1 vertebrae is intact.  See additional lumbar details below.  IMPRESSION:   1.  Severely comminuted T12 vertebral body fracture, with a severe compression versus mild burst configuration.  Some retropulsion of the posterior-superior endplate, but with relatively well maintained patency of the spinal canal at this level (AP dimension and 10 mm).  T12 posterior elements appear intact.  2.  Osteopenia.  No other acute thoracic spine fracture identified. Mild degenerative anterolisthesis at C7-T1 and T2-T3.  3.  Lumbar spine findings are below.  CT LUMBAR SPINE WITHOUT CONTRAST  Technique:  Multidetector CT imaging of the lumbar spine was performed without intravenous contrast administration.  Multiplanar CT image reconstructions were also generated.  Findings:  Normal lumbar segmentation.  T12 fracture as detailed above.  Lumbar vertebrae intact.  Lumbar vertebral height and alignment normal except for trace anterolisthesis of L4 on L5, with associated moderate to severe facet degeneration at that level. Visible sacrum appears intact. Visualized paraspinal soft tissues are within normal limits.  Advanced L3-L4 disc degeneration with vacuum disc phenomena. Superimposed posterior element hypertrophy.  This level and the L3- L4 level demonstrate multifactorial moderate to severe spinal stenosis.  IMPRESSION:  1.  No acute traumatic injury in the lumbar spine.  2.  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  Study discussed by telephone with Dr. Dina Rich on 06/02/2013 at 2229 hours.   Original Report Authenticated By: Roselyn Reef, M.D.   Dg Foot Complete Right  06/02/2013   *RADIOLOGY REPORT*  Clinical Data: 67 year old female status post fall with pain.  RIGHT FOOT COMPLETE - 3+ VIEW  Comparison: None.  Findings: Comminuted but fairly nondisplaced at this time fracture of the  calcaneus (see lateral view).  Small ossific fragment lateral to the cuboid is favored to be an accessory ossicle and not the sequelae of trauma.  Tarsal bones other than the calcaneus appear intact.  Metatarsals and phalanges appear intact.  IMPRESSION: 1.  Comminuted fracture of the calcaneus, fairly nondisplaced at this time. 2.  No additional acute fracture or dislocation identified in the right foot.   Original Report Authenticated By: Roselyn Reef, M.D.   Ct Extrem Lower Wo Cm Bil  06/02/2013   *RADIOLOGY REPORT*  Clinical Data:  Fall, calcaneal fracture  CT BILATERAL FEET WITH AND WITHOUT CONTRAST  Technique:  Multidetector CT imaging of the bilateral feet was performed according to the standard protocol before and following intravenous contrast administration. Multiplanar CT image reconstructions were also generated.  Comparison:  Conventional radiographs obtained earlier today at 1912 hours  Findings:  Cross-sectional imaging to the bilateral feet demonstrates a comminuted fracture through the body of the right calcaneus.  There is minimal displacement and no significant depression.  Incidental note is made of bilateral os tibialis externa, os trigonum and os peroneus.  No definite fracture of the left calcaneus. No focal soft tissue abnormality.  IMPRESSION:  Comminuted, nondisplaced fracture through the body of the right calcaneus.  No significant height loss or bony compaction.   Original Report Authenticated By: Jacqulynn Cadet, M.D.    Review of Systems  Musculoskeletal: Positive for back pain.    Blood pressure 107/62, pulse 79, temperature 98.4 F (36.9 C), temperature source Oral, resp. rate 16, SpO2 94.00%. Physical Exam  Constitutional: She is oriented to person, place, and time. She appears well-developed and well-nourished.  HENT:  Head: Normocephalic and atraumatic.  Eyes: Conjunctivae and EOM are normal. Pupils are equal, round, and reactive to light.  GI: Soft. Bowel sounds  are normal.  Musculoskeletal:       Lumbar back: She exhibits tenderness, bony tenderness and pain. She exhibits no deformity.  Neurological: She is alert and oriented to person, place, and time. She has normal reflexes.  Skin: Skin is warm and dry.  Psychiatric: She has a normal mood and affect. Her behavior is normal. Judgment and thought content normal.     Assessment/Plan Fall off ladder T-12 burst fracture.  Dr. Christella Noa was consulted and recommended TLSO brace, no note yet on the chart Left radius fracture, splinted per Dr. Percell Miller. Calcaneus fracture.  Admit for pain control and hydration. Dr. Christella Noa and Percell Miller to see in the AM.  Gwenyth Ober 06/03/2013, 12:59 AM   Procedures

## 2013-06-03 NOTE — ED Notes (Signed)
RN notified Ortho in reference to TLSO Brace. Ortho instructed that brace will be brought to the patient in the morning.

## 2013-06-03 NOTE — Progress Notes (Signed)
LOS: 1 day   Subjective: Pt c/o left arm, mid back, and right heal pain.  No other complaints.  Pt has been on bed rest until she can get up with PT/OT with TLSO brace.    Objective: Vital signs in last 24 hours: Temp:  [98.4 F (36.9 C)-98.7 F (37.1 C)] 98.6 F (37 C) (08/17 0555) Pulse Rate:  [74-89] 82 (08/17 0555) Resp:  [16-18] 18 (08/17 0555) BP: (107-131)/(53-75) 118/53 mmHg (08/17 0555) SpO2:  [87 %-99 %] 94 % (08/17 0555) Weight:  [113 lb 8.6 oz (51.5 kg)] 113 lb 8.6 oz (51.5 kg) (08/17 0222) Last BM Date: 06/02/13  Lab Results:  CBC  Recent Labs  06/02/13 1840  WBC 18.1*  HGB 13.5  HCT 38.0  PLT 257   BMET  Recent Labs  06/02/13 1840  NA 134*  K 3.6  CL 99  CO2 22  GLUCOSE 88  BUN 10  CREATININE 0.62  CALCIUM 8.9    Imaging: Dg Wrist Complete Left  06/02/2013   *RADIOLOGY REPORT*  Clinical Data: Fall, left wrist pain  LEFT WRIST - COMPLETE 3+ VIEW  Comparison: None.  Findings: Four views of the left wrist submitted.  Study is limited by diffuse osteopenia.  There is nondisplaced mild impacted fracture in distal left radial metaphysis.  On lateral view the fracture line is seen involving the distal articular surface of the radius. Degenerative changes first carpal metacarpal joint.  IMPRESSION: There is nondisplaced mild impacted fracture in distal left radial metaphysis.  On lateral view the fracture line is seen involving the distal articular surface of the radius.   Original Report Authenticated By: Lahoma Crocker, M.D.   Dg Ankle Complete Right  06/02/2013   *RADIOLOGY REPORT*  Clinical Data: Fall  RIGHT ANKLE - COMPLETE 3+ VIEW  Comparison: None.  Findings: Three views of the right ankle submitted.  Study is limited by diffuse osteopenia.  Ankle mortise is preserved.  No ankle fracture.  There is nondisplaced fracture mid aspect of the calcaneus.  IMPRESSION: No ankle fracture.  Nondisplaced fracture  of the calcaneus.  Soft tissue swelling in hind foot.    Original Report Authenticated By: Lahoma Crocker, M.D.   Ct Chest W Contrast  06/02/2013   *RADIOLOGY REPORT*  Clinical Data:  67 year old female status post fall from 6 feet ladder.  Pain.  CT CHEST, ABDOMEN AND PELVIS WITH CONTRAST  Technique:  Multidetector CT imaging of the chest, abdomen and pelvis was performed following the standard protocol during bolus administration of intravenous contrast.  Contrast:  100 ml Omnipaque-300.  Comparison:  [facility] chest radiographs 07/28/2009.  CT CHEST  Findings:  Severe upper lobe predominant centrilobular emphysema. No pneumothorax. Mild dependent pulmonary opacity in both lungs most resembling atelectasis.  No definite pleural effusion.  Osteopenia.  No rib fracture identified.  Sternum intact.  Thoracic spine is reported below.  Negative thoracic inlet.  No mediastinal hematoma.  No pericardial effusion.  Thoracic aorta appears intact with atherosclerosis. Mild enlargement of the central pulmonary arteries.  No mediastinal lymphadenopathy.  No axillary lymphadenopathy.  IMPRESSION:  1.  See thoracic spine injury described in the dedicated spine report below.  2.  No other acute traumatic injury identified in the chest. Emphysema and mild atelectasis.  3.  Abdomen, pelvis, and spine findings are below.  CT ABDOMEN AND PELVIS  Findings:  Lumbar spine findings are reported below.  Osteopenia.  Sacrum and SI joints intact.  No acute pelvis fracture identified.  Proximal femurs intact.  No pelvic free fluid.  Nondilated large and small bowel loops in the pelvis.  Mildly distended bladder.  Uterus and adnexa appear to be surgically absent.  No dilated bowel loops.  Redundant colon.  Stool and fluid in the proximal colon.  Decompressed stomach and duodenum.  No liver injury identified.  Mildly decreased liver density throughout suggesting a degree of steatosis, and small sub centimeter left lobe hypodense areas which probably are benign cysts or biliary  hamartomas.  Intact spleen with occasional calcified granulomas.  Negative pancreas, adrenal glands, and gallbladder.  Portal venous system appears grossly normal.  Widespread atherosclerosis of the aorta and its branches.  Major arterial structures in the abdomen and pelvis are patent.  No abdominal free fluid.  No lymphadenopathy identified.  IMPRESSION:   1. No acute traumatic injury identified in the abdomen and pelvis.  2.  See thoracic and lumbar spine findings below.   CT THORACIC SPINE WITHOUT CONTRAST  Technique: Multidetector CT imaging of the thoracic spine was performed without intravenous contrast administration. Multiplanar CT image reconstructions were also generated.  Findings:  Osteopenia.  Mild anterolisthesis of C7 on T1 appears to be degenerative and is associated with bilateral facet hypertrophy.  Similar mild anterolisthesis of T2-9 T3 appears to be degenerative and associated with facet hypertrophy.  Severe T12 vertebral body fracture, severely comminuted with retropulsion of the posterior-superior endplate up to 6 mm.  This mildly narrows the AP spinal canal dimension 210-11 mm.  The pedicles appear intact along with the remaining T12 posterior elements.  Minimal to mild paraspinal edema/stranding.  No CT evidence of epidural hemorrhage.  Mild T7 superior endplate deformity appears to be chronic and probably degenerative. Other thoracic vertebrae are intact. Posterior ribs are intact.  The L1 vertebrae is intact.  See additional lumbar details below.  IMPRESSION:   1.  Severely comminuted T12 vertebral body fracture, with a severe compression versus mild burst configuration.  Some retropulsion of the posterior-superior endplate, but with relatively well maintained patency of the spinal canal at this level (AP dimension and 10 mm).  T12 posterior elements appear intact.  2.  Osteopenia.  No other acute thoracic spine fracture identified. Mild degenerative anterolisthesis at C7-T1 and T2-T3.   3.  Lumbar spine findings are below.  CT LUMBAR SPINE WITHOUT CONTRAST  Technique:  Multidetector CT imaging of the lumbar spine was performed without intravenous contrast administration.  Multiplanar CT image reconstructions were also generated.  Findings:  Normal lumbar segmentation.  T12 fracture as detailed above.  Lumbar vertebrae intact.  Lumbar vertebral height and alignment normal except for trace anterolisthesis of L4 on L5, with associated moderate to severe facet degeneration at that level. Visible sacrum appears intact. Visualized paraspinal soft tissues are within normal limits.  Advanced L3-L4 disc degeneration with vacuum disc phenomena. Superimposed posterior element hypertrophy.  This level and the L3- L4 level demonstrate multifactorial moderate to severe spinal stenosis.  IMPRESSION:  1.  No acute traumatic injury in the lumbar spine.  2.  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  Study discussed by telephone with Dr. Dina Rich on 06/02/2013 at 2229 hours.   Original Report Authenticated By: Roselyn Reef, M.D.   Ct Lumbar Spine Wo Contrast  06/02/2013   *RADIOLOGY REPORT*  Clinical Data:  67 year old female status post fall from 6 feet ladder.  Pain.  CT CHEST, ABDOMEN AND PELVIS WITH CONTRAST  Technique:  Multidetector CT imaging of the chest, abdomen and pelvis was  performed following the standard protocol during bolus administration of intravenous contrast.  Contrast:  100 ml Omnipaque-300.  Comparison:  [facility] chest radiographs 07/28/2009.  CT CHEST  Findings:  Severe upper lobe predominant centrilobular emphysema. No pneumothorax. Mild dependent pulmonary opacity in both lungs most resembling atelectasis.  No definite pleural effusion.  Osteopenia.  No rib fracture identified.  Sternum intact.  Thoracic spine is reported below.  Negative thoracic inlet.  No mediastinal hematoma.  No pericardial effusion.  Thoracic aorta appears intact with  atherosclerosis. Mild enlargement of the central pulmonary arteries.  No mediastinal lymphadenopathy.  No axillary lymphadenopathy.  IMPRESSION:  1.  See thoracic spine injury described in the dedicated spine report below.  2.  No other acute traumatic injury identified in the chest. Emphysema and mild atelectasis.  3.  Abdomen, pelvis, and spine findings are below.  CT ABDOMEN AND PELVIS  Findings:  Lumbar spine findings are reported below.  Osteopenia.  Sacrum and SI joints intact.  No acute pelvis fracture identified.  Proximal femurs intact.  No pelvic free fluid.  Nondilated large and small bowel loops in the pelvis.  Mildly distended bladder.  Uterus and adnexa appear to be surgically absent.  No dilated bowel loops.  Redundant colon.  Stool and fluid in the proximal colon.  Decompressed stomach and duodenum.  No liver injury identified.  Mildly decreased liver density throughout suggesting a degree of steatosis, and small sub centimeter left lobe hypodense areas which probably are benign cysts or biliary hamartomas.  Intact spleen with occasional calcified granulomas.  Negative pancreas, adrenal glands, and gallbladder.  Portal venous system appears grossly normal.  Widespread atherosclerosis of the aorta and its branches.  Major arterial structures in the abdomen and pelvis are patent.  No abdominal free fluid.  No lymphadenopathy identified.  IMPRESSION:   1. No acute traumatic injury identified in the abdomen and pelvis.  2.  See thoracic and lumbar spine findings below.   CT THORACIC SPINE WITHOUT CONTRAST  Technique: Multidetector CT imaging of the thoracic spine was performed without intravenous contrast administration. Multiplanar CT image reconstructions were also generated.  Findings:  Osteopenia.  Mild anterolisthesis of C7 on T1 appears to be degenerative and is associated with bilateral facet hypertrophy.  Similar mild anterolisthesis of T2-9 T3 appears to be degenerative and associated with facet  hypertrophy.  Severe T12 vertebral body fracture, severely comminuted with retropulsion of the posterior-superior endplate up to 6 mm.  This mildly narrows the AP spinal canal dimension 210-11 mm.  The pedicles appear intact along with the remaining T12 posterior elements.  Minimal to mild paraspinal edema/stranding.  No CT evidence of epidural hemorrhage.  Mild T7 superior endplate deformity appears to be chronic and probably degenerative. Other thoracic vertebrae are intact. Posterior ribs are intact.  The L1 vertebrae is intact.  See additional lumbar details below.  IMPRESSION:   1.  Severely comminuted T12 vertebral body fracture, with a severe compression versus mild burst configuration.  Some retropulsion of the posterior-superior endplate, but with relatively well maintained patency of the spinal canal at this level (AP dimension and 10 mm).  T12 posterior elements appear intact.  2.  Osteopenia.  No other acute thoracic spine fracture identified. Mild degenerative anterolisthesis at C7-T1 and T2-T3.  3.  Lumbar spine findings are below.  CT LUMBAR SPINE WITHOUT CONTRAST  Technique:  Multidetector CT imaging of the lumbar spine was performed without intravenous contrast administration.  Multiplanar CT image reconstructions were also generated.  Findings:  Normal lumbar segmentation.  T12 fracture as detailed above.  Lumbar vertebrae intact.  Lumbar vertebral height and alignment normal except for trace anterolisthesis of L4 on L5, with associated moderate to severe facet degeneration at that level. Visible sacrum appears intact. Visualized paraspinal soft tissues are within normal limits.  Advanced L3-L4 disc degeneration with vacuum disc phenomena. Superimposed posterior element hypertrophy.  This level and the L3- L4 level demonstrate multifactorial moderate to severe spinal stenosis.  IMPRESSION:  1.  No acute traumatic injury in the lumbar spine.  2.  Degenerative multifactorial L3-L4 and L4-L5 spinal  stenosis.  Study discussed by telephone with Dr. Dina Rich on 06/02/2013 at 2229 hours.   Original Report Authenticated By: Roselyn Reef, M.D.   Ct Abdomen Pelvis W Contrast  06/02/2013   *RADIOLOGY REPORT*  Clinical Data:  67 year old female status post fall from 6 feet ladder.  Pain.  CT CHEST, ABDOMEN AND PELVIS WITH CONTRAST  Technique:  Multidetector CT imaging of the chest, abdomen and pelvis was performed following the standard protocol during bolus administration of intravenous contrast.  Contrast:  100 ml Omnipaque-300.  Comparison:  [facility] chest radiographs 07/28/2009.  CT CHEST  Findings:  Severe upper lobe predominant centrilobular emphysema. No pneumothorax. Mild dependent pulmonary opacity in both lungs most resembling atelectasis.  No definite pleural effusion.  Osteopenia.  No rib fracture identified.  Sternum intact.  Thoracic spine is reported below.  Negative thoracic inlet.  No mediastinal hematoma.  No pericardial effusion.  Thoracic aorta appears intact with atherosclerosis. Mild enlargement of the central pulmonary arteries.  No mediastinal lymphadenopathy.  No axillary lymphadenopathy.  IMPRESSION:  1.  See thoracic spine injury described in the dedicated spine report below.  2.  No other acute traumatic injury identified in the chest. Emphysema and mild atelectasis.  3.  Abdomen, pelvis, and spine findings are below.  CT ABDOMEN AND PELVIS  Findings:  Lumbar spine findings are reported below.  Osteopenia.  Sacrum and SI joints intact.  No acute pelvis fracture identified.  Proximal femurs intact.  No pelvic free fluid.  Nondilated large and small bowel loops in the pelvis.  Mildly distended bladder.  Uterus and adnexa appear to be surgically absent.  No dilated bowel loops.  Redundant colon.  Stool and fluid in the proximal colon.  Decompressed stomach and duodenum.  No liver injury identified.  Mildly decreased liver density throughout suggesting a degree of steatosis,  and small sub centimeter left lobe hypodense areas which probably are benign cysts or biliary hamartomas.  Intact spleen with occasional calcified granulomas.  Negative pancreas, adrenal glands, and gallbladder.  Portal venous system appears grossly normal.  Widespread atherosclerosis of the aorta and its branches.  Major arterial structures in the abdomen and pelvis are patent.  No abdominal free fluid.  No lymphadenopathy identified.  IMPRESSION:   1. No acute traumatic injury identified in the abdomen and pelvis.  2.  See thoracic and lumbar spine findings below.   CT THORACIC SPINE WITHOUT CONTRAST  Technique: Multidetector CT imaging of the thoracic spine was performed without intravenous contrast administration. Multiplanar CT image reconstructions were also generated.  Findings:  Osteopenia.  Mild anterolisthesis of C7 on T1 appears to be degenerative and is associated with bilateral facet hypertrophy.  Similar mild anterolisthesis of T2-9 T3 appears to be degenerative and associated with facet hypertrophy.  Severe T12 vertebral body fracture, severely comminuted with retropulsion of the posterior-superior endplate up to 6 mm.  This mildly  narrows the AP spinal canal dimension 210-11 mm.  The pedicles appear intact along with the remaining T12 posterior elements.  Minimal to mild paraspinal edema/stranding.  No CT evidence of epidural hemorrhage.  Mild T7 superior endplate deformity appears to be chronic and probably degenerative. Other thoracic vertebrae are intact. Posterior ribs are intact.  The L1 vertebrae is intact.  See additional lumbar details below.  IMPRESSION:   1.  Severely comminuted T12 vertebral body fracture, with a severe compression versus mild burst configuration.  Some retropulsion of the posterior-superior endplate, but with relatively well maintained patency of the spinal canal at this level (AP dimension and 10 mm).  T12 posterior elements appear intact.  2.  Osteopenia.  No other  acute thoracic spine fracture identified. Mild degenerative anterolisthesis at C7-T1 and T2-T3.  3.  Lumbar spine findings are below.  CT LUMBAR SPINE WITHOUT CONTRAST  Technique:  Multidetector CT imaging of the lumbar spine was performed without intravenous contrast administration.  Multiplanar CT image reconstructions were also generated.  Findings:  Normal lumbar segmentation.  T12 fracture as detailed above.  Lumbar vertebrae intact.  Lumbar vertebral height and alignment normal except for trace anterolisthesis of L4 on L5, with associated moderate to severe facet degeneration at that level. Visible sacrum appears intact. Visualized paraspinal soft tissues are within normal limits.  Advanced L3-L4 disc degeneration with vacuum disc phenomena. Superimposed posterior element hypertrophy.  This level and the L3- L4 level demonstrate multifactorial moderate to severe spinal stenosis.  IMPRESSION:  1.  No acute traumatic injury in the lumbar spine.  2.  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  Study discussed by telephone with Dr. Dina Rich on 06/02/2013 at 2229 hours.   Original Report Authenticated By: Roselyn Reef, M.D.   Dg Foot Complete Right  06/02/2013   *RADIOLOGY REPORT*  Clinical Data: 67 year old female status post fall with pain.  RIGHT FOOT COMPLETE - 3+ VIEW  Comparison: None.  Findings: Comminuted but fairly nondisplaced at this time fracture of the calcaneus (see lateral view).  Small ossific fragment lateral to the cuboid is favored to be an accessory ossicle and not the sequelae of trauma.  Tarsal bones other than the calcaneus appear intact.  Metatarsals and phalanges appear intact.  IMPRESSION: 1.  Comminuted fracture of the calcaneus, fairly nondisplaced at this time. 2.  No additional acute fracture or dislocation identified in the right foot.   Original Report Authenticated By: Roselyn Reef, M.D.   Ct Extrem Lower Wo Cm Bil  06/02/2013   *RADIOLOGY REPORT*  Clinical Data:   Fall, calcaneal fracture  CT BILATERAL FEET WITH AND WITHOUT CONTRAST  Technique:  Multidetector CT imaging of the bilateral feet was performed according to the standard protocol before and following intravenous contrast administration. Multiplanar CT image reconstructions were also generated.  Comparison:  Conventional radiographs obtained earlier today at 1912 hours  Findings:  Cross-sectional imaging to the bilateral feet demonstrates a comminuted fracture through the body of the right calcaneus.  There is minimal displacement and no significant depression.  Incidental note is made of bilateral os tibialis externa, os trigonum and os peroneus.  No definite fracture of the left calcaneus. No focal soft tissue abnormality.  IMPRESSION:  Comminuted, nondisplaced fracture through the body of the right calcaneus.  No significant height loss or bony compaction.   Original Report Authenticated By: Jacqulynn Cadet, M.D.     PE: General appearance: alert, cooperative and no distress Head: Normocephalic, without obvious abnormality Resp:  clear to auscultation bilaterally Chest wall: no tenderness Cardio: regular rate and rhythm, S1, S2 normal, no murmur, click, rub or gallop GI: soft, non-tender; bowel sounds normal; no masses,  no organomegaly Extremities: Left UE in splint, distal CSM intact; Right UE atramatic; Left LE atraumatic; Right LE with splint, distal CSM intact Pulses: 1+ RUE and LLE distally   Assessment/Plan: Fall off ladder T12 burst fx - Dr. Joya Salm following, recommended TLSO brace, OOB with assistance Left radius fx - splinted per Dr. Victorino Dike fx - splinted, non-displaced, pending Ortho to eval Osteopenia - needs to f/u with PCP as OP, started calcium and vitamin D Leukocytosis - recheck pending Tobacco use - hold off on nicotine patch due to delayed bone healing VTE - SCD's, Lovenox FEN - Advance diet to regular diet Dispo -- PT/OT ordered   Coralie Keens, PA-C Pager:  [phone] General Trauma PA Pager: [phone]   06/03/2013

## 2013-06-03 NOTE — Progress Notes (Signed)
I have personally interviewed and examined this patient this afternoon. I agree with the assessment and treatment plan outlined by Coralie Keens, PA.  On Lovenox for DVT prophylaxis.  Management per neurosurgery and orthopedics.  Edsel Petrin. Dalbert Batman, M.D., [facility] General and Minimally invasive Surgery Breast and Colorectal Surgery Office:   [phone] Pager:   [phone]

## 2013-06-04 DIAGNOSIS — M949 Disorder of cartilage, unspecified: Secondary | ICD-10-CM

## 2013-06-04 DIAGNOSIS — F17200 Nicotine dependence, unspecified, uncomplicated: Secondary | ICD-10-CM | POA: Diagnosis present

## 2013-06-04 DIAGNOSIS — S22009A Unspecified fracture of unspecified thoracic vertebra, initial encounter for closed fracture: Secondary | ICD-10-CM | POA: Diagnosis not present

## 2013-06-04 DIAGNOSIS — D72829 Elevated white blood cell count, unspecified: Secondary | ICD-10-CM | POA: Diagnosis not present

## 2013-06-04 DIAGNOSIS — M899 Disorder of bone, unspecified: Secondary | ICD-10-CM

## 2013-06-04 DIAGNOSIS — S52309A Unspecified fracture of shaft of unspecified radius, initial encounter for closed fracture: Secondary | ICD-10-CM

## 2013-06-04 DIAGNOSIS — W11XXXA Fall on and from ladder, initial encounter: Secondary | ICD-10-CM | POA: Diagnosis not present

## 2013-06-04 DIAGNOSIS — M48061 Spinal stenosis, lumbar region without neurogenic claudication: Secondary | ICD-10-CM | POA: Diagnosis not present

## 2013-06-04 DIAGNOSIS — S92009A Unspecified fracture of unspecified calcaneus, initial encounter for closed fracture: Secondary | ICD-10-CM | POA: Diagnosis not present

## 2013-06-04 DIAGNOSIS — S5290XA Unspecified fracture of unspecified forearm, initial encounter for closed fracture: Secondary | ICD-10-CM | POA: Diagnosis not present

## 2013-06-04 MED ORDER — METHOCARBAMOL 500 MG PO TABS
750.0000 mg | ORAL_TABLET | Freq: Four times a day (QID) | ORAL | Status: DC | PRN
  Administered 2013-06-04 (×2): 750 mg via ORAL
  Filled 2013-06-04 (×2): qty 2

## 2013-06-04 MED ORDER — HYDROCODONE-ACETAMINOPHEN 5-325 MG PO TABS
0.5000 | ORAL_TABLET | ORAL | Status: DC | PRN
  Administered 2013-06-04: 1 via ORAL
  Administered 2013-06-04 – 2013-06-05 (×4): 2 via ORAL
  Administered 2013-06-06: 1 via ORAL
  Filled 2013-06-04: qty 2
  Filled 2013-06-04: qty 1
  Filled 2013-06-04 (×2): qty 2
  Filled 2013-06-04: qty 1
  Filled 2013-06-04: qty 2

## 2013-06-04 MED ORDER — HYDROMORPHONE HCL PF 1 MG/ML IJ SOLN: 1.0000 mg | INTRAMUSCULAR | Status: DC | PRN

## 2013-06-04 NOTE — Progress Notes (Signed)
Patient ID: Kayla Mack, female   DOB: 12-23-1945, 68 y.o.   MRN: [id number] C/o of thoraco-lumnar pain. No weakness . Seen by pt

## 2013-06-04 NOTE — Progress Notes (Signed)
Rehab Admissions Coordinator Note:  Patient was screened by Cleatrice Burke for appropriateness for an Inpatient Acute Rehab Consult.  At this time, we are recommending Inpatient Rehab consult per request Trauma Attending MD. I will place order.   Cleatrice Burke 06/04/2013, 9:39 AM  I can be reached at [phone].

## 2013-06-04 NOTE — Progress Notes (Signed)
LOS: 2 days   Subjective: Pt doing okay, pain well controlled.  C/o pain in left wrist, right foot, and back.  She hasn't had PT/OT come to show her how to put the brace on yet so she's been bed bound since arrival.  Pt also hasn't been able to eat because she can't reach anything due to her back constraints.  Pt urinating and having BM's.  Tolerating regular diet.  Objective: Vital signs in last 24 hours: Temp:  [98.2 F (36.8 C)-100.2 F (37.9 C)] 98.7 F (37.1 C) (08/18 0545) Pulse Rate:  [74-96] 80 (08/18 0545) Resp:  [16-18] 18 (08/18 0545) BP: (95-128)/(48-62) 128/62 mmHg (08/18 0545) SpO2:  [81 %-95 %] 92 % (08/18 0545) Last BM Date: 06/02/13  Lab Results:  CBC  Recent Labs  06/02/13 1840 06/03/13 1045  WBC 18.1* 9.9  HGB 13.5 12.9  HCT 38.0 37.1  PLT 257 223   BMET  Recent Labs  06/02/13 1840 06/03/13 1045  NA 134* 134*  K 3.6 3.9  CL 99 101  CO2 22 24  GLUCOSE 88 127*  BUN 10 9  CREATININE 0.62 0.62  CALCIUM 8.9 8.7    Imaging: Dg Wrist Complete Left  06/02/2013   *RADIOLOGY REPORT*  Clinical Data: Fall, left wrist pain  LEFT WRIST - COMPLETE 3+ VIEW  Comparison: None.  Findings: Four views of the left wrist submitted.  Study is limited by diffuse osteopenia.  There is nondisplaced mild impacted fracture in distal left radial metaphysis.  On lateral view the fracture line is seen involving the distal articular surface of the radius. Degenerative changes first carpal metacarpal joint.  IMPRESSION: There is nondisplaced mild impacted fracture in distal left radial metaphysis.  On lateral view the fracture line is seen involving the distal articular surface of the radius.   Original Report Authenticated By: Lahoma Crocker, M.D.   Dg Ankle Complete Right  06/02/2013   *RADIOLOGY REPORT*  Clinical Data: Fall  RIGHT ANKLE - COMPLETE 3+ VIEW  Comparison: None.  Findings: Three views of the right ankle submitted.  Study is limited by diffuse osteopenia.  Ankle mortise  is preserved.  No ankle fracture.  There is nondisplaced fracture mid aspect of the calcaneus.  IMPRESSION: No ankle fracture.  Nondisplaced fracture  of the calcaneus.  Soft tissue swelling in hind foot.   Original Report Authenticated By: Lahoma Crocker, M.D.   Ct Chest W Contrast  06/02/2013   *RADIOLOGY REPORT*  Clinical Data:  67 year old female status post fall from 6 feet ladder.  Pain.  CT CHEST, ABDOMEN AND PELVIS WITH CONTRAST  Technique:  Multidetector CT imaging of the chest, abdomen and pelvis was performed following the standard protocol during bolus administration of intravenous contrast.  Contrast:  100 ml Omnipaque-300.  Comparison:  [facility] chest radiographs 07/28/2009.  CT CHEST  Findings:  Severe upper lobe predominant centrilobular emphysema. No pneumothorax. Mild dependent pulmonary opacity in both lungs most resembling atelectasis.  No definite pleural effusion.  Osteopenia.  No rib fracture identified.  Sternum intact.  Thoracic spine is reported below.  Negative thoracic inlet.  No mediastinal hematoma.  No pericardial effusion.  Thoracic aorta appears intact with atherosclerosis. Mild enlargement of the central pulmonary arteries.  No mediastinal lymphadenopathy.  No axillary lymphadenopathy.  IMPRESSION:  1.  See thoracic spine injury described in the dedicated spine report below.  2.  No other acute traumatic injury identified in the chest. Emphysema and mild atelectasis.  3.  Abdomen,  pelvis, and spine findings are below.  CT ABDOMEN AND PELVIS  Findings:  Lumbar spine findings are reported below.  Osteopenia.  Sacrum and SI joints intact.  No acute pelvis fracture identified.  Proximal femurs intact.  No pelvic free fluid.  Nondilated large and small bowel loops in the pelvis.  Mildly distended bladder.  Uterus and adnexa appear to be surgically absent.  No dilated bowel loops.  Redundant colon.  Stool and fluid in the proximal colon.  Decompressed stomach and  duodenum.  No liver injury identified.  Mildly decreased liver density throughout suggesting a degree of steatosis, and small sub centimeter left lobe hypodense areas which probably are benign cysts or biliary hamartomas.  Intact spleen with occasional calcified granulomas.  Negative pancreas, adrenal glands, and gallbladder.  Portal venous system appears grossly normal.  Widespread atherosclerosis of the aorta and its branches.  Major arterial structures in the abdomen and pelvis are patent.  No abdominal free fluid.  No lymphadenopathy identified.  IMPRESSION:   1. No acute traumatic injury identified in the abdomen and pelvis.  2.  See thoracic and lumbar spine findings below.   CT THORACIC SPINE WITHOUT CONTRAST  Technique: Multidetector CT imaging of the thoracic spine was performed without intravenous contrast administration. Multiplanar CT image reconstructions were also generated.  Findings:  Osteopenia.  Mild anterolisthesis of C7 on T1 appears to be degenerative and is associated with bilateral facet hypertrophy.  Similar mild anterolisthesis of T2-9 T3 appears to be degenerative and associated with facet hypertrophy.  Severe T12 vertebral body fracture, severely comminuted with retropulsion of the posterior-superior endplate up to 6 mm.  This mildly narrows the AP spinal canal dimension 210-11 mm.  The pedicles appear intact along with the remaining T12 posterior elements.  Minimal to mild paraspinal edema/stranding.  No CT evidence of epidural hemorrhage.  Mild T7 superior endplate deformity appears to be chronic and probably degenerative. Other thoracic vertebrae are intact. Posterior ribs are intact.  The L1 vertebrae is intact.  See additional lumbar details below.  IMPRESSION:   1.  Severely comminuted T12 vertebral body fracture, with a severe compression versus mild burst configuration.  Some retropulsion of the posterior-superior endplate, but with relatively well maintained patency of the spinal  canal at this level (AP dimension and 10 mm).  T12 posterior elements appear intact.  2.  Osteopenia.  No other acute thoracic spine fracture identified. Mild degenerative anterolisthesis at C7-T1 and T2-T3.  3.  Lumbar spine findings are below.  CT LUMBAR SPINE WITHOUT CONTRAST  Technique:  Multidetector CT imaging of the lumbar spine was performed without intravenous contrast administration.  Multiplanar CT image reconstructions were also generated.  Findings:  Normal lumbar segmentation.  T12 fracture as detailed above.  Lumbar vertebrae intact.  Lumbar vertebral height and alignment normal except for trace anterolisthesis of L4 on L5, with associated moderate to severe facet degeneration at that level. Visible sacrum appears intact. Visualized paraspinal soft tissues are within normal limits.  Advanced L3-L4 disc degeneration with vacuum disc phenomena. Superimposed posterior element hypertrophy.  This level and the L3- L4 level demonstrate multifactorial moderate to severe spinal stenosis.  IMPRESSION:  1.  No acute traumatic injury in the lumbar spine.  2.  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  Study discussed by telephone with Dr. Dina Rich on 06/02/2013 at 2229 hours.   Original Report Authenticated By: Roselyn Reef, M.D.   Ct Lumbar Spine Wo Contrast  06/02/2013   *RADIOLOGY REPORT*  Clinical Data:  67 year old female status post fall from 6 feet ladder.  Pain.  CT CHEST, ABDOMEN AND PELVIS WITH CONTRAST  Technique:  Multidetector CT imaging of the chest, abdomen and pelvis was performed following the standard protocol during bolus administration of intravenous contrast.  Contrast:  100 ml Omnipaque-300.  Comparison:  [facility] chest radiographs 07/28/2009.  CT CHEST  Findings:  Severe upper lobe predominant centrilobular emphysema. No pneumothorax. Mild dependent pulmonary opacity in both lungs most resembling atelectasis.  No definite pleural effusion.   Osteopenia.  No rib fracture identified.  Sternum intact.  Thoracic spine is reported below.  Negative thoracic inlet.  No mediastinal hematoma.  No pericardial effusion.  Thoracic aorta appears intact with atherosclerosis. Mild enlargement of the central pulmonary arteries.  No mediastinal lymphadenopathy.  No axillary lymphadenopathy.  IMPRESSION:  1.  See thoracic spine injury described in the dedicated spine report below.  2.  No other acute traumatic injury identified in the chest. Emphysema and mild atelectasis.  3.  Abdomen, pelvis, and spine findings are below.  CT ABDOMEN AND PELVIS  Findings:  Lumbar spine findings are reported below.  Osteopenia.  Sacrum and SI joints intact.  No acute pelvis fracture identified.  Proximal femurs intact.  No pelvic free fluid.  Nondilated large and small bowel loops in the pelvis.  Mildly distended bladder.  Uterus and adnexa appear to be surgically absent.  No dilated bowel loops.  Redundant colon.  Stool and fluid in the proximal colon.  Decompressed stomach and duodenum.  No liver injury identified.  Mildly decreased liver density throughout suggesting a degree of steatosis, and small sub centimeter left lobe hypodense areas which probably are benign cysts or biliary hamartomas.  Intact spleen with occasional calcified granulomas.  Negative pancreas, adrenal glands, and gallbladder.  Portal venous system appears grossly normal.  Widespread atherosclerosis of the aorta and its branches.  Major arterial structures in the abdomen and pelvis are patent.  No abdominal free fluid.  No lymphadenopathy identified.  IMPRESSION:   1. No acute traumatic injury identified in the abdomen and pelvis.  2.  See thoracic and lumbar spine findings below.   CT THORACIC SPINE WITHOUT CONTRAST  Technique: Multidetector CT imaging of the thoracic spine was performed without intravenous contrast administration. Multiplanar CT image reconstructions were also generated.  Findings:  Osteopenia.   Mild anterolisthesis of C7 on T1 appears to be degenerative and is associated with bilateral facet hypertrophy.  Similar mild anterolisthesis of T2-9 T3 appears to be degenerative and associated with facet hypertrophy.  Severe T12 vertebral body fracture, severely comminuted with retropulsion of the posterior-superior endplate up to 6 mm.  This mildly narrows the AP spinal canal dimension 210-11 mm.  The pedicles appear intact along with the remaining T12 posterior elements.  Minimal to mild paraspinal edema/stranding.  No CT evidence of epidural hemorrhage.  Mild T7 superior endplate deformity appears to be chronic and probably degenerative. Other thoracic vertebrae are intact. Posterior ribs are intact.  The L1 vertebrae is intact.  See additional lumbar details below.  IMPRESSION:   1.  Severely comminuted T12 vertebral body fracture, with a severe compression versus mild burst configuration.  Some retropulsion of the posterior-superior endplate, but with relatively well maintained patency of the spinal canal at this level (AP dimension and 10 mm).  T12 posterior elements appear intact.  2.  Osteopenia.  No other acute thoracic spine fracture identified. Mild degenerative anterolisthesis at C7-T1 and T2-T3.  3.  Lumbar spine findings are below.  CT LUMBAR SPINE WITHOUT CONTRAST  Technique:  Multidetector CT imaging of the lumbar spine was performed without intravenous contrast administration.  Multiplanar CT image reconstructions were also generated.  Findings:  Normal lumbar segmentation.  T12 fracture as detailed above.  Lumbar vertebrae intact.  Lumbar vertebral height and alignment normal except for trace anterolisthesis of L4 on L5, with associated moderate to severe facet degeneration at that level. Visible sacrum appears intact. Visualized paraspinal soft tissues are within normal limits.  Advanced L3-L4 disc degeneration with vacuum disc phenomena. Superimposed posterior element hypertrophy.  This level  and the L3- L4 level demonstrate multifactorial moderate to severe spinal stenosis.  IMPRESSION:  1.  No acute traumatic injury in the lumbar spine.  2.  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  Study discussed by telephone with Dr. Dina Rich on 06/02/2013 at 2229 hours.   Original Report Authenticated By: Roselyn Reef, M.D.   Ct Abdomen Pelvis W Contrast  06/02/2013   *RADIOLOGY REPORT*  Clinical Data:  67 year old female status post fall from 6 feet ladder.  Pain.  CT CHEST, ABDOMEN AND PELVIS WITH CONTRAST  Technique:  Multidetector CT imaging of the chest, abdomen and pelvis was performed following the standard protocol during bolus administration of intravenous contrast.  Contrast:  100 ml Omnipaque-300.  Comparison:  [facility] chest radiographs 07/28/2009.  CT CHEST  Findings:  Severe upper lobe predominant centrilobular emphysema. No pneumothorax. Mild dependent pulmonary opacity in both lungs most resembling atelectasis.  No definite pleural effusion.  Osteopenia.  No rib fracture identified.  Sternum intact.  Thoracic spine is reported below.  Negative thoracic inlet.  No mediastinal hematoma.  No pericardial effusion.  Thoracic aorta appears intact with atherosclerosis. Mild enlargement of the central pulmonary arteries.  No mediastinal lymphadenopathy.  No axillary lymphadenopathy.  IMPRESSION:  1.  See thoracic spine injury described in the dedicated spine report below.  2.  No other acute traumatic injury identified in the chest. Emphysema and mild atelectasis.  3.  Abdomen, pelvis, and spine findings are below.  CT ABDOMEN AND PELVIS  Findings:  Lumbar spine findings are reported below.  Osteopenia.  Sacrum and SI joints intact.  No acute pelvis fracture identified.  Proximal femurs intact.  No pelvic free fluid.  Nondilated large and small bowel loops in the pelvis.  Mildly distended bladder.  Uterus and adnexa appear to be surgically absent.  No dilated bowel  loops.  Redundant colon.  Stool and fluid in the proximal colon.  Decompressed stomach and duodenum.  No liver injury identified.  Mildly decreased liver density throughout suggesting a degree of steatosis, and small sub centimeter left lobe hypodense areas which probably are benign cysts or biliary hamartomas.  Intact spleen with occasional calcified granulomas.  Negative pancreas, adrenal glands, and gallbladder.  Portal venous system appears grossly normal.  Widespread atherosclerosis of the aorta and its branches.  Major arterial structures in the abdomen and pelvis are patent.  No abdominal free fluid.  No lymphadenopathy identified.  IMPRESSION:   1. No acute traumatic injury identified in the abdomen and pelvis.  2.  See thoracic and lumbar spine findings below.   CT THORACIC SPINE WITHOUT CONTRAST  Technique: Multidetector CT imaging of the thoracic spine was performed without intravenous contrast administration. Multiplanar CT image reconstructions were also generated.  Findings:  Osteopenia.  Mild anterolisthesis of C7 on T1 appears to be degenerative and is associated with bilateral facet hypertrophy.  Similar mild anterolisthesis of T2-9 T3 appears to be degenerative and associated with facet hypertrophy.  Severe T12 vertebral body fracture, severely comminuted with retropulsion of the posterior-superior endplate up to 6 mm.  This mildly narrows the AP spinal canal dimension 210-11 mm.  The pedicles appear intact along with the remaining T12 posterior elements.  Minimal to mild paraspinal edema/stranding.  No CT evidence of epidural hemorrhage.  Mild T7 superior endplate deformity appears to be chronic and probably degenerative. Other thoracic vertebrae are intact. Posterior ribs are intact.  The L1 vertebrae is intact.  See additional lumbar details below.  IMPRESSION:   1.  Severely comminuted T12 vertebral body fracture, with a severe compression versus mild burst configuration.  Some retropulsion of  the posterior-superior endplate, but with relatively well maintained patency of the spinal canal at this level (AP dimension and 10 mm).  T12 posterior elements appear intact.  2.  Osteopenia.  No other acute thoracic spine fracture identified. Mild degenerative anterolisthesis at C7-T1 and T2-T3.  3.  Lumbar spine findings are below.  CT LUMBAR SPINE WITHOUT CONTRAST  Technique:  Multidetector CT imaging of the lumbar spine was performed without intravenous contrast administration.  Multiplanar CT image reconstructions were also generated.  Findings:  Normal lumbar segmentation.  T12 fracture as detailed above.  Lumbar vertebrae intact.  Lumbar vertebral height and alignment normal except for trace anterolisthesis of L4 on L5, with associated moderate to severe facet degeneration at that level. Visible sacrum appears intact. Visualized paraspinal soft tissues are within normal limits.  Advanced L3-L4 disc degeneration with vacuum disc phenomena. Superimposed posterior element hypertrophy.  This level and the L3- L4 level demonstrate multifactorial moderate to severe spinal stenosis.  IMPRESSION:  1.  No acute traumatic injury in the lumbar spine.  2.  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  Study discussed by telephone with Dr. Dina Rich on 06/02/2013 at 2229 hours.   Original Report Authenticated By: Roselyn Reef, M.D.   Dg Foot Complete Right  06/02/2013   *RADIOLOGY REPORT*  Clinical Data: 67 year old female status post fall with pain.  RIGHT FOOT COMPLETE - 3+ VIEW  Comparison: None.  Findings: Comminuted but fairly nondisplaced at this time fracture of the calcaneus (see lateral view).  Small ossific fragment lateral to the cuboid is favored to be an accessory ossicle and not the sequelae of trauma.  Tarsal bones other than the calcaneus appear intact.  Metatarsals and phalanges appear intact.  IMPRESSION: 1.  Comminuted fracture of the calcaneus, fairly nondisplaced at this time. 2.  No  additional acute fracture or dislocation identified in the right foot.   Original Report Authenticated By: Roselyn Reef, M.D.   Ct Extrem Lower Wo Cm Bil  06/02/2013   *RADIOLOGY REPORT*  Clinical Data:  Fall, calcaneal fracture  CT BILATERAL FEET WITH AND WITHOUT CONTRAST  Technique:  Multidetector CT imaging of the bilateral feet was performed according to the standard protocol before and following intravenous contrast administration. Multiplanar CT image reconstructions were also generated.  Comparison:  Conventional radiographs obtained earlier today at 1912 hours  Findings:  Cross-sectional imaging to the bilateral feet demonstrates a comminuted fracture through the body of the right calcaneus.  There is minimal displacement and no significant depression.  Incidental note is made of bilateral os tibialis externa, os trigonum and os peroneus.  No definite fracture of the left calcaneus. No focal soft tissue abnormality.  IMPRESSION:  Comminuted, nondisplaced fracture through the body of the right  calcaneus.  No significant height loss or bony compaction.   Original Report Authenticated By: Jacqulynn Cadet, M.D.     PE: General appearance: alert, cooperative and no distress  Head: Normocephalic, without obvious abnormality  Resp: clear to auscultation bilaterally  Chest wall: no tenderness  Cardio: regular rate and rhythm, S1, S2 normal, no murmur, click, rub or gallop  GI: soft, non-tender; bowel sounds normal; no masses, no organomegaly  Extremities: Left UE in splint, distal CSM intact; Right UE atramatic; Left LE atraumatic; Right LE with splint, distal CSM intact  Pulses: 1+ RUE and LLE distally    Assessment/Plan: Fall off ladder  T12 burst fx - Dr. Joya Salm following, recommended TLSO brace, OOB with assistance  Left radius fx - splinted per Dr. Percell Miller, non-operative tx, NWB LUE wrist, but okay to WBAT through elbow Calcaneous fx - splinted, non-operative tx, non-displaced, Dr. Percell Miller  please clarify WB status for right LE (I assume its NWB) Osteopenia - needs to f/u with PCP as OP, started calcium and vitamin D  Leukocytosis - resolved yesterday Tobacco use - hold off on nicotine patch due to delayed bone healing  VTE - SCD's, Lovenox  FEN - Regular diet  Dispo -- PT/OT to see today, hopefully CIR    Coralie Keens, PA-C Pager: [phone] General Trauma PA Pager: [phone]   06/04/2013

## 2013-06-04 NOTE — Consult Note (Signed)
Physical Medicine and Rehabilitation Consult   Reason for Consult: T-12 burst fracture, left radius fracture and right calcaneous fracture Referring Physician:  Dr. Hulen Skains   HPI: Kayla Mack is a 67 y.o. female with history of HTN, depression, anxiety disorder; who fell backwards off a  6 foot ladder while hanging a picture and landed on her back and left hand. She was admitted for work up on 06/02/13 and was noted to have comminuted, nondisplaced right calcaneous fracture, T-12 burst fracture as well as nondisplaced mildly impacted distal left radius metaphysis fracture. She was evaluated by Dr. Joya Salm who recommended  TLSO for stabilization of thoracic spine. Left radius and right ankle splinted per input from Dr Alain Marion and patient NWB L-wrist and LLE. PT/OT evaluations done today and patient noted to be hypoxic. MD recommending CIR.   Patient reports that she has no local support and would need someone to help her till she is liberalized from her restrictions/ brace. She is in process of finding someone to help take care of 68 year old wheelchair bound mother.   Review of Systems  Respiratory: Positive for cough and wheezing.   Musculoskeletal: Positive for myalgias (severe spams.) and back pain.  Neurological: Positive for weakness.   Past Medical History  Diagnosis Date  . Depression   . Hypertension   . Anxiety   . Hyperlipidemia    Past Surgical History  Procedure Laterality Date  . Appendectomy    . Tonsillectomy    . Shoulder surgery Right Pin   No family history on file.  Social History:  reports that she has been smoking Cigarettes.  She has a 60 pack-year smoking history. She does not have any smokeless tobacco history on file. She reports that  drinks alcohol. She reports that she does not use illicit drugs.    Allergies  Allergen Reactions  . Aleve [Naproxen Sodium] Hives  . Aspirin Other (See Comments)    Facial swelling  . Penicillins Hives    Medications Prior to Admission  Medication Sig Dispense Refill  . acetaminophen (TYLENOL) 500 MG tablet Take 1,000 mg by mouth 2 (two) times daily as needed for pain.       [name] atenolol (TENORMIN) 50 MG tablet Take 50 mg by mouth every morning.      . clonazePAM (KLONOPIN) 0.5 MG tablet Take 0.5 mg by mouth 2 (two) times daily as needed for anxiety.       . lovastatin (MEVACOR) 40 MG tablet Take 40 mg by mouth every morning.      . sertraline (ZOLOFT) 100 MG tablet Take 100 mg by mouth every morning.        Home: Home Living Family/patient expects to be discharged to:: Private residence Living Arrangements: Alone Type of Home: House Home Access: Stairs to enter [employer] of Steps: 1 (door threshold ~3 inches) Home Layout: One level Home Equipment: [place] - 2 wheels (found RW on the side of the road) Additional Comments: caregiver for mother - mother uses electric scooter to get around house and currently it is attached to patients car and not accessible  Functional History:   Functional Status:  Mobility: Bed Mobility Bed Mobility: Rolling Left;Left Sidelying to Sit;Supine to Sit;Sitting - Scoot to Edge of Bed Rolling Left: 4: Min guard;With rail Left Sidelying to Sit: 4: Min assist Supine to Sit: 4: Min assist;HOB flat Sitting - Scoot to [employer] of Bed: 4: Min assist Transfers Transfers: Sit to Stand;Stand to [employer]  Sit to Stand: 4: Min assist;With upper extremity assist;From bed Stand to Sit: 4: Min assist;With upper extremity assist;To chair/3-in-1 Stand Pivot Transfers: 4: Min assist (with platform walker) Ambulation/Gait Ambulation/Gait Assistance: 4: Min assist Ambulation Distance (Feet): 10 Feet Assistive device: Left platform walker Ambulation/Gait Assistance Details: + SOB, SpO2 at 76% on 3 LO2 via [employer]. pt able to maintain R LE NWB Gait Pattern: Step-to pattern Gait velocity: slow Stairs: No    ADL: ADL Eating/Feeding: Set up Where  Assessed - Eating/Feeding: Chair Grooming: Wash/dry face;Wash/dry hands;Set up Where Assessed - Grooming: Supported sitting Lower Body Dressing: +1 Total assistance Where Assessed - Lower Body Dressing: Supine, head of bed up Toilet Transfer: Minimal assistance Toilet Transfer Method: Sit to stand Toilet Transfer Equipment: Raised toilet seat with arms (or 3-in-1 over toilet) Equipment Used: Back brace;Rolling walker;Gait belt (platform for RW) Transfers/Ambulation Related to ADLs: Pt min (A) and cues for safety. Pt is slight implusive and stands with cues to decr speed ADL Comments: Pt supine on arrival and educated on the purpose of OT/ Pt evaluation. Pt currently main caregiver for mother and lives alone. pt has no family or friends to (A) in care. Pt moving well and will need SNF at d/c planning. Pt is impulsive and needs cues for safety. Pt will progress quickly however will not have (A) to d/c home at this time. Please contact SW/ CM to address mother's care. Pt currently at home without ability to mobilize. Pt sitting in chair eating at end of session. Pt demonstrated ability to complete 3n1 transfer.  Cognition: Cognition Overall Cognitive Status: Within Functional Limits for tasks assessed Orientation Level: Oriented X4 Cognition Arousal/Alertness: Awake/alert Behavior During Therapy: WFL for tasks assessed/performed Overall Cognitive Status: Within Functional Limits for tasks assessed  Blood pressure 119/70, pulse 90, temperature 98.8 F (37.1 C), temperature source Oral, resp. rate 18, height _0  (1.575 m), weight 51.5 kg (113 lb 8.6 oz), SpO2 94.00%.   Physical Exam  Nursing note and vitals reviewed. Constitutional: She is oriented to person, place, and time.  Thin frail appearing female.  HENT:  Head: Normocephalic and atraumatic.  Right Ear: External ear normal.  Neck: Normal range of motion. Neck supple.  Cardiovascular: Normal rate and regular rhythm.    Pulmonary/Chest: Effort normal. She has wheezes.  Musculoskeletal:  Wearing TLSO, right leg in spint, left arm splinted as well. Appropriately tender in all injured areas.   Neurological: She is alert and oriented to person, place, and time. She has normal reflexes.  Pt alert, coherent, CN exam normal. No sensory loss. Outside of her injured extremities, she moves all muscles and joints normally.   Psychiatric: She has a normal mood and affect. Her behavior is normal.    Results for orders placed during the hospital encounter of 06/02/13 (from the past 24 hour(s))  BASIC METABOLIC PANEL     Status: Abnormal   Collection Time    06/03/13 10:45 AM      Result Value Range   Sodium 134 (*) 135 - 145 mEq/L   Potassium 3.9  3.5 - 5.1 mEq/L   Chloride 101  96 - 112 mEq/L   CO2 24  19 - 32 mEq/L   Glucose, Bld 127 (*) 70 - 99 mg/dL   BUN 9  6 - 23 mg/dL   Creatinine, Ser 0.62  0.50 - 1.10 mg/dL   Calcium 8.7  8.4 - 10.5 mg/dL   GFR calc non Af Amer >90  >90 mL/min  GFR calc Af Amer >90  >90 mL/min  CBC     Status: None   Collection Time    06/03/13 10:45 AM      Result Value Range   WBC 9.9  4.0 - 10.5 K/uL   RBC 3.93  3.87 - 5.11 MIL/uL   Hemoglobin 12.9  12.0 - 15.0 g/dL   HCT 37.1  36.0 - 46.0 %   MCV 94.4  78.0 - 100.0 fL   MCH 32.8  26.0 - 34.0 pg   MCHC 34.8  30.0 - 36.0 g/dL   RDW 13.1  11.5 - 15.5 %   Platelets 223  150 - 400 K/uL   Dg Wrist Complete Left  06/02/2013   *RADIOLOGY REPORT*  Clinical Data: Fall, left wrist pain  LEFT WRIST - COMPLETE 3+ VIEW  Comparison: None.  Findings: Four views of the left wrist submitted.  Study is limited by diffuse osteopenia.  There is nondisplaced mild impacted fracture in distal left radial metaphysis.  On lateral view the fracture line is seen involving the distal articular surface of the radius. Degenerative changes first carpal metacarpal joint.  IMPRESSION: There is nondisplaced mild impacted fracture in distal left radial  metaphysis.  On lateral view the fracture line is seen involving the distal articular surface of the radius.   Original Report Authenticated By: Lahoma Crocker, M.D.   Dg Ankle Complete Right  06/02/2013   *RADIOLOGY REPORT*  Clinical Data: Fall  RIGHT ANKLE - COMPLETE 3+ VIEW  Comparison: None.  Findings: Three views of the right ankle submitted.  Study is limited by diffuse osteopenia.  Ankle mortise is preserved.  No ankle fracture.  There is nondisplaced fracture mid aspect of the calcaneus.  IMPRESSION: No ankle fracture.  Nondisplaced fracture  of the calcaneus.  Soft tissue swelling in hind foot.   Original Report Authenticated By: Lahoma Crocker, M.D.   Ct Lumbar Spine Wo Contrast  06/02/2013   *RADIOLOGY REPORT*  Clinical Data:  67 year old female status post fall from 6 feet ladder.  Pain.  CT CHEST, ABDOMEN AND PELVIS WITH CONTRAST  Technique:  Multidetector CT imaging of the chest, abdomen and pelvis was performed following the standard protocol during bolus administration of intravenous contrast.  Contrast:  100 ml Omnipaque-300.  Comparison:  [facility] chest radiographs 07/28/2009.  CT CHEST  Findings:  Severe upper lobe predominant centrilobular emphysema. No pneumothorax. Mild dependent pulmonary opacity in both lungs most resembling atelectasis.  No definite pleural effusion.  Osteopenia.  No rib fracture identified.  Sternum intact.  Thoracic spine is reported below.  Negative thoracic inlet.  No mediastinal hematoma.  No pericardial effusion.  Thoracic aorta appears intact with atherosclerosis. Mild enlargement of the central pulmonary arteries.  No mediastinal lymphadenopathy.  No axillary lymphadenopathy.  IMPRESSION:  1.  See thoracic spine injury described in the dedicated spine report below.  2.  No other acute traumatic injury identified in the chest. Emphysema and mild atelectasis.  3.  Abdomen, pelvis, and spine findings are below.  CT ABDOMEN AND PELVIS  Findings:  Lumbar  spine findings are reported below.  Osteopenia.  Sacrum and SI joints intact.  No acute pelvis fracture identified.  Proximal femurs intact.  No pelvic free fluid.  Nondilated large and small bowel loops in the pelvis.  Mildly distended bladder.  Uterus and adnexa appear to be surgically absent.  No dilated bowel loops.  Redundant colon.  Stool and fluid in the proximal colon.  Decompressed stomach  and duodenum.  No liver injury identified.  Mildly decreased liver density throughout suggesting a degree of steatosis, and small sub centimeter left lobe hypodense areas which probably are benign cysts or biliary hamartomas.  Intact spleen with occasional calcified granulomas.  Negative pancreas, adrenal glands, and gallbladder.  Portal venous system appears grossly normal.  Widespread atherosclerosis of the aorta and its branches.  Major arterial structures in the abdomen and pelvis are patent.  No abdominal free fluid.  No lymphadenopathy identified.  IMPRESSION:   1. No acute traumatic injury identified in the abdomen and pelvis.  2.  See thoracic and lumbar spine findings below.   CT THORACIC SPINE WITHOUT CONTRAST  Technique: Multidetector CT imaging of the thoracic spine was performed without intravenous contrast administration. Multiplanar CT image reconstructions were also generated.  Findings:  Osteopenia.  Mild anterolisthesis of C7 on T1 appears to be degenerative and is associated with bilateral facet hypertrophy.  Similar mild anterolisthesis of T2-9 T3 appears to be degenerative and associated with facet hypertrophy.  Severe T12 vertebral body fracture, severely comminuted with retropulsion of the posterior-superior endplate up to 6 mm.  This mildly narrows the AP spinal canal dimension 210-11 mm.  The pedicles appear intact along with the remaining T12 posterior elements.  Minimal to mild paraspinal edema/stranding.  No CT evidence of epidural hemorrhage.  Mild T7 superior endplate deformity appears to be  chronic and probably degenerative. Other thoracic vertebrae are intact. Posterior ribs are intact.  The L1 vertebrae is intact.  See additional lumbar details below.  IMPRESSION:   1.  Severely comminuted T12 vertebral body fracture, with a severe compression versus mild burst configuration.  Some retropulsion of the posterior-superior endplate, but with relatively well maintained patency of the spinal canal at this level (AP dimension and 10 mm).  T12 posterior elements appear intact.  2.  Osteopenia.  No other acute thoracic spine fracture identified. Mild degenerative anterolisthesis at C7-T1 and T2-T3.  3.  Lumbar spine findings are below. CT LUMBAR SPINE WITHOUT CONTRAST  Technique:  Multidetector CT imaging of the lumbar spine was performed without intravenous contrast administration.  Multiplanar CT image reconstructions were also generated.  Findings:  Normal lumbar segmentation.  T12 fracture as detailed above.  Lumbar vertebrae intact.  Lumbar vertebral height and alignment normal except for trace anterolisthesis of L4 on L5, with associated moderate to severe facet degeneration at that level. Visible sacrum appears intact. Visualized paraspinal soft tissues are within normal limits.  Advanced L3-L4 disc degeneration with vacuum disc phenomena. Superimposed posterior element hypertrophy.  This level and the L3- L4 level demonstrate multifactorial moderate to severe spinal stenosis.  IMPRESSION:  1.  No acute traumatic injury in the lumbar spine.  2.  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  Study discussed by telephone with Dr. Dina Rich on 06/02/2013 at 2229 hours.   Original Report Authenticated By: Roselyn Reef, M.D.    Ct Extrem Lower Wo Cm Bil  06/02/2013   *RADIOLOGY REPORT*  Clinical Data:  Fall, calcaneal fracture  CT BILATERAL FEET WITH AND WITHOUT CONTRAST  Technique:  Multidetector CT imaging of the bilateral feet was performed according to the standard protocol before and  following intravenous contrast administration. Multiplanar CT image reconstructions were also generated.  Comparison:  Conventional radiographs obtained earlier today at 1912 hours  Findings:  Cross-sectional imaging to the bilateral feet demonstrates a comminuted fracture through the body of the right calcaneus.  There is minimal displacement and no significant depression.  Incidental note is made of bilateral os tibialis externa, os trigonum and os peroneus.  No definite fracture of the left calcaneus. No focal soft tissue abnormality.  IMPRESSION:  Comminuted, nondisplaced fracture through the body of the right calcaneus.  No significant height loss or bony compaction.   Original Report Authenticated By: Jacqulynn Cadet, M.D.    Assessment/Plan: Diagnosis: right calcaneal, left radius, T12 burst fx after a fall from ladder 1. Does the need for close, 24 hr/day medical supervision in concert with the patient's rehab needs make it unreasonable for this patient to be served in a less intensive setting? Potentially 2. Co-Morbidities requiring supervision/potential complications: osteopenia 3. Due to bladder management, bowel management, safety, skin/wound care, disease management, medication administration, pain management and patient education, does the patient require 24 hr/day rehab nursing? Potentially 4. Does the patient require coordinated care of a physician, rehab nurse, PT (1-2 hrs/day, 5 days/week) and OT (1-2 hrs/day, 5 days/week) to address physical and functional deficits in the context of the above medical diagnosis(es)? Potentially Addressing deficits in the following areas: balance, endurance, locomotion, strength, transferring, bowel/bladder control, bathing, dressing, feeding, grooming, toileting and psychosocial support 5. Can the patient actively participate in an intensive therapy program of at least 3 hrs of therapy per day at least 5 days per week? Yes 6. The potential for patient  to make measurable gains while on inpatient rehab is excellent 7. Anticipated functional outcomes upon discharge from inpatient rehab are supervision to mod I with PT,mod I to minimal assist with OT, n/a with SLP. 8. Estimated rehab length of stay to reach the above functional goals is: 2 weeks 9. Does the patient have adequate social supports to accommodate these discharge functional goals? No 10. Anticipated D/C setting: Home 11. Anticipated post D/C treatments: [facility] therapy 12. Overall Rehab/Functional Prognosis: good  RECOMMENDATIONS: This patient's condition is appropriate for continued rehabilitative care in the following setting: SNF (CIR if assistance available at home) Patient has agreed to participate in recommended program. Potentially Note that insurance prior authorization may be required for reimbursement for recommended care.  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 She appears to lack social supports which could provide temporary assistance for both her and her mother once she leaves the hospital. Probably will need short term SNF. Pt will call her friends/family to see if there might be someone who could help in the short term. Rehab RN to follow up.   Meredith Staggers, MD, [facility]     06/04/2013

## 2013-06-04 NOTE — Progress Notes (Signed)
UR completed.

## 2013-06-04 NOTE — Progress Notes (Signed)
CIR or SNF would be okay.  Patient's mother needs some assistance also.  This patient has been seen and I agree with the findings and treatment plan.  Kathryne Eriksson. Dahlia Bailiff, MD, [place] [phone] (pager) [phone] (direct pager) Trauma Surgeon

## 2013-06-04 NOTE — Progress Notes (Signed)
Patient ID: Kayla Mack, female   DOB: [birth year], 67 y.o.   MRN: [id number] Stable. C/o lumbar pain. Neuro stable

## 2013-06-04 NOTE — Evaluation (Signed)
Occupational Therapy Evaluation Patient Details Name: Kayla Mack MRN: [id number] DOB: October 04, 1946 Today's Date: 06/04/2013 Time: 7619-5093 OT Time Calculation (min): 44 min  OT Assessment / Plan / Recommendation History of present illness 67 YO FEMALE S/P FALL FROM LADDER HANGING A PICTURE. PT PRESENTES WITH rT CALCANEUS FX, LT RADIUS FX, AND T12 BURST FX. Pt With TLSO to be don in supine currently. 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    Clinical Impression   PT admitted with s/p fall with T10 burst fx TLSO required, Rt calcaneus fx and Lt radius fx. Pt currently with functional limitiations due to the deficits listed below (see OT problem list). Pt with limited caregiver support at d/c and therefore a SNF d/c plan. Pt will benefit from skilled OT to increase their independence and safety with adls and balance to allow discharge SNF.     OT Assessment  Patient needs continued OT Services    Follow Up Recommendations  SNF;Supervision/Assistance - 24 hour    Barriers to Discharge Decreased caregiver support pt is primary caregiver for mother  Equipment Recommendations  3 in 1 bedside comode;Other (comment);Wheelchair (measurements OT);Wheelchair cushion (measurements OT) (platform RW)    Recommendations for Other Services    Frequency  Min 3X/week    Precautions / Restrictions Precautions Precautions: Fall;Back;Other (comment) back handout provided Required Braces or Orthoses: Spinal Brace;Other Brace/Splint Spinal Brace: Thoracolumbosacral orthotic;Applied in supine position (no direct order on eval so supine until order placed) Other Brace/Splint: Sugartong splint on Left UE and ED cast on RT LE at this time Restrictions Weight Bearing Restrictions: Yes LUE Weight Bearing: Weight bear through elbow only RLE Weight Bearing: Non weight bearing Other Position/Activity Restrictions: Left UE weight bearing at ELBOW only   Pertinent Vitals/Pain 7- 8  out 10 pain at back Pt with decr oxygen saturations to 70s during session on 3 Liters Educated on pursed lip breathing    ADL  Eating/Feeding: Set up Where Assessed - Eating/Feeding: Chair Grooming: Wash/dry face;Wash/dry hands;Set up Where Assessed - Grooming: Supported sitting Lower Body Dressing: +1 Total assistance Where Assessed - Lower Body Dressing: Supine, head of bed up Toilet Transfer: Minimal assistance Toilet Transfer Method: Sit to stand Toilet Transfer Equipment: Raised toilet seat with arms (or 3-in-1 over toilet) Toileting - Clothing Manipulation and Hygiene: Moderate assistance Where Assessed - Toileting Clothing Manipulation and Hygiene: Sit to stand from 3-in-1 or toilet Equipment Used: Back brace;Rolling walker;Gait belt (platform for RW) Transfers/Ambulation Related to ADLs: Pt min (A) and cues for safety. Pt is slight implusive and stands with cues to decr speed ADL Comments: Pt supine on arrival and educated on the purpose of OT/ Pt evaluation. Pt currently main caregiver for mother and lives alone. pt has no family or friends to (A) in care. Pt moving well and will need SNF at d/c planning. Pt is impulsive and needs cues for safety. Pt will progress quickly however will not have (A) to d/c home at this time. Please contact SW/ CM to address mother's care. Pt currently at home without ability to mobilize. Pt sitting in chair eating at end of session. Pt demonstrated ability to complete 3n1 transfer.    OT Diagnosis: Generalized weakness  OT Problem List: Decreased strength;Decreased activity tolerance;Impaired balance (sitting and/or standing);Decreased safety awareness;Decreased knowledge of use of DME or AE;Decreased knowledge of precautions;Cardiopulmonary status limiting activity;Impaired UE functional use OT Treatment Interventions: Self-care/ADL training;Therapeutic exercise;DME and/or AE instruction;Therapeutic activities;Patient/family education;Balance training    OT  Goals(Current goals can be found in the care plan section) Acute Rehab OT Goals Patient Stated Goal: to get better OT Goal Formulation: With patient Time For Goal Achievement: 06/18/13 Potential to Achieve Goals: Good  Visit Information  Last OT Received On: 06/04/13 Assistance Needed: +1 PT/OT Co-Evaluation/Treatment: Yes History of Present Illness: 67 YO FEMALE S/P FALL FROM LADDER HANGING A PICTURE. PT PRESENTES WITH rT CALCANEUS FX, LT RADIUS FX, AND T12 BURST FX. Pt With TLSO to be don in supine currently. 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        Prior Boomer expects to be discharged to:: Private residence Living Arrangements: Alone Type of Home: House Home Access: Stairs to enter [employer] of Steps: 1 (door threshold ~3 inches) Home Layout: One level Home Equipment: [place] - 2 wheels (found RW on the side of the road) Additional Comments: caregiver for mother - mother uses electric scooter to get around house and currently it is attached to patients car and not accessible Prior Function Level of Independence: Independent Communication Communication: No difficulties Dominant Hand: Right         Vision/Perception Vision - History Baseline Vision: Wears glasses all the time Patient Visual Report: No change from baseline Vision - Assessment Eye Alignment: Within Functional Limits   Cognition  Cognition Arousal/Alertness: Awake/alert Behavior During Therapy: WFL for tasks assessed/performed Overall Cognitive Status: Within Functional Limits for tasks assessed    Extremity/Trunk Assessment Upper Extremity Assessment Upper Extremity Assessment: LUE deficits/detail LUE Deficits / Details: currently in a sugar tong splint for distal radius fx LUE Coordination: decreased fine motor;decreased gross motor Lower Extremity Assessment Lower Extremity Assessment: Defer to PT  evaluation Cervical / Trunk Assessment Cervical / Trunk Assessment: Normal     Mobility Bed Mobility Bed Mobility: Rolling Left;Left Sidelying to Sit;Supine to Sit;Sitting - Scoot to Edge of Bed Rolling Left: 4: Min guard;With rail Left Sidelying to Sit: 4: Min assist Supine to Sit: 4: Min assist;HOB flat Sitting - Scoot to Edge of Bed: 4: Min assist Details for Bed Mobility Assistance: Pt educated on back precautions with bed mobility. Pt progressing well and following cues for sequence Transfers Transfers: Sit to Stand;Stand to Sit Sit to Stand: 4: Min assist;With upper extremity assist;From bed Stand to Sit: 4: Min assist;With upper extremity assist;To chair/3-in-1 Details for Transfer Assistance: minA due to pt impulsivity, v/c's for hand placement     Exercise     Balance Balance Balance Assessed: No   End of Session OT - End of Session Activity Tolerance: Patient tolerated treatment well;Other (comment) (oxgyen decr during session 70 s) Patient left: in chair;with call bell/phone within reach Nurse Communication: Mobility status;Precautions  GO     Veneda Melter 06/04/2013, 9:20 AM Pager: [phone]

## 2013-06-04 NOTE — Evaluation (Signed)
Physical Therapy Evaluation Patient Details Name: Kayla Mack MRN: [id number] DOB: 07/18/1946 Today's Date: 06/04/2013 Time: 1157-2620 PT Time Calculation (min): 40 min  PT Assessment / Plan / Recommendation History of Present Illness  67 YO FEMALE S/P FALL FROM LADDER HANGING A PICTURE. PT PRESENTES WITH rT CALCANEUS FX, LT RADIUS FX, AND T12 BURST FX. Pt With TLSO to be don in supine currently. 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   Clinical Impression  Pt very motivated an tolerating OOB mobility well. However due to nature of injuries pt most likely to be NWB on R LE fL hand/wrist for 4-6 weeks anticipate pt to benefit from ST-SNF to achieve maximal functional recovery to mod I function for safe transition home alone. Pt unable to return home alone in current condition and has no family/friends to provide 24/7 assist.    PT Assessment  Patient needs continued PT services    Follow Up Recommendations  SNF    Does the patient have the potential to tolerate intense rehabilitation      Barriers to Discharge Decreased caregiver support pt lives alone and is primary caregiver to mom    Equipment Recommendations   (L platform walker)    Recommendations for Other Services     Frequency Min 5X/week    Precautions / Restrictions Precautions Precautions: Fall;Back;Other (comment) Required Braces or Orthoses: Spinal Brace;Other Brace/Splint Spinal Brace: Thoracolumbosacral orthotic;Applied in supine position (no direct order on eval so supine until order placed) Other Brace/Splint: Sugartong splint on Left UE and ED cast on RT LE at this time Restrictions Weight Bearing Restrictions: Yes LUE Weight Bearing: Weight bear through elbow only RLE Weight Bearing: Non weight bearing Other Position/Activity Restrictions: Left UE weight bearing at ELBOW only   Pertinent Vitals/Pain SpO2 at 86% at rest on 3 Lo2 via [place] SpO2 at 76% on 3LO2 via [place] during amb       Mobility  Bed Mobility Bed Mobility: Rolling Left;Left Sidelying to Sit;Supine to Sit;Sitting - Scoot to Edge of Bed Rolling Left: 4: Min guard;With rail Left Sidelying to Sit: 4: Min assist Supine to Sit: 4: Min assist;HOB flat Sitting - Scoot to Edge of Bed: 4: Min assist Details for Bed Mobility Assistance: Pt educated on back precautions with bed mobility. Pt progressing well and following cues for sequence Transfers Transfers: Sit to Stand;Stand to Sit;Stand Pivot Transfers Sit to Stand: 4: Min assist;With upper extremity assist;From bed Stand to Sit: 4: Min assist;With upper extremity assist;To chair/3-in-1 Stand Pivot Transfers: 4: Min assist (with platform walker) Details for Transfer Assistance: minA due to pt impulsivity, v/c's for hand placement Ambulation/Gait Ambulation/Gait Assistance: 4: Min assist Ambulation Distance (Feet): 10 Feet Assistive device: Left platform walker Ambulation/Gait Assistance Details: + SOB, SpO2 at 76% on 3 LO2 via [place]. pt able to maintain R LE NWB Gait Pattern: Step-to pattern Gait velocity: slow Stairs: No    Exercises     PT Diagnosis: Difficulty walking;Acute pain  PT Problem List: Decreased strength;Decreased activity tolerance;Decreased mobility;Decreased knowledge of use of DME;Decreased safety awareness;Cardiopulmonary status limiting activity PT Treatment Interventions: DME instruction;Gait training;Functional mobility training;Therapeutic activities;Therapeutic exercise     PT Goals(Current goals can be found in the care plan section) Acute Rehab PT Goals Patient Stated Goal: to get better PT Goal Formulation: With patient Time For Goal Achievement: 06/11/13 Potential to Achieve Goals: Good  Visit Information  Last PT Received On: 06/04/13 Assistance Needed: +1 History of Present Illness: 67 YO FEMALE  S/P FALL FROM LADDER HANGING A PICTURE. PT PRESENTES WITH rT CALCANEUS FX - R LE NWB, LT RADIUS FX L UE WBing thru elbow  only, AND T12 BURST FX. Pt With TLSO to be don in supine currently. 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        Prior [place] expects to be discharged to:: Private residence Living Arrangements: Alone Type of Home: House Home Access: Stairs to enter [employer] of Steps: 1 (door threshold ~3 inches) Home Layout: One level Home Equipment: [place] - 2 wheels (found RW on the side of the road) Additional Comments: caregiver for mother - mother uses electric scooter to get around house and currently it is attached to patients car and not accessible Prior Function Level of Independence: Independent Communication Communication: No difficulties Dominant Hand: Right    Cognition  Cognition Arousal/Alertness: Awake/alert Behavior During Therapy: WFL for tasks assessed/performed Overall Cognitive Status: Within Functional Limits for tasks assessed    Extremity/Trunk Assessment Upper Extremity Assessment Upper Extremity Assessment: LUE deficits/detail LUE Deficits / Details: currently in a sugar tong splint for distal radius fx LUE Coordination: decreased fine motor;decreased gross motor Lower Extremity Assessment Lower Extremity Assessment: Defer to PT evaluation Cervical / Trunk Assessment Cervical / Trunk Assessment: Normal   Balance Balance Balance Assessed: No  End of Session PT - End of Session Equipment Utilized During Treatment: Gait belt;Back brace;Oxygen Activity Tolerance: Patient tolerated treatment well;Patient limited by fatigue Patient left: in chair;with call bell/phone within reach Nurse Communication: Mobility status;Weight bearing status  GP     Kingsley Callander 06/04/2013, 9:19 AM Kittie Plater, PT, DPT Pager #: [phone] Office #: [phone]

## 2013-06-05 ENCOUNTER — Inpatient Hospital Stay (HOSPITAL_COMMUNITY): Payer: Medicare Other

## 2013-06-05 DIAGNOSIS — D72829 Elevated white blood cell count, unspecified: Secondary | ICD-10-CM | POA: Diagnosis not present

## 2013-06-05 DIAGNOSIS — M8448XA Pathological fracture, other site, initial encounter for fracture: Secondary | ICD-10-CM | POA: Diagnosis not present

## 2013-06-05 DIAGNOSIS — M48061 Spinal stenosis, lumbar region without neurogenic claudication: Secondary | ICD-10-CM | POA: Diagnosis not present

## 2013-06-05 DIAGNOSIS — S92009A Unspecified fracture of unspecified calcaneus, initial encounter for closed fracture: Secondary | ICD-10-CM | POA: Diagnosis not present

## 2013-06-05 DIAGNOSIS — S5290XA Unspecified fracture of unspecified forearm, initial encounter for closed fracture: Secondary | ICD-10-CM | POA: Diagnosis not present

## 2013-06-05 DIAGNOSIS — S22009A Unspecified fracture of unspecified thoracic vertebra, initial encounter for closed fracture: Secondary | ICD-10-CM | POA: Diagnosis not present

## 2013-06-05 MED ORDER — HYDROMORPHONE HCL PF 1 MG/ML IJ SOLN: 0.5000 mg | INTRAMUSCULAR | Status: DC | PRN

## 2013-06-05 NOTE — Progress Notes (Signed)
Patient ID: Kayla Mack, female   DOB: 1946-05-07, 67 y.o.   MRN: [id number]   LOS: 3 days   Subjective: Doing ok this morning. Has been having a lot of trouble with abdominal cramping.   Objective: Vital signs in last 24 hours: Temp:  [97.9 F (36.6 C)-98.8 F (37.1 C)] 97.9 F (36.6 C) (08/19 0455) Pulse Rate:  [72-90] 72 (08/19 0455) Resp:  [16-18] 18 (08/19 0455) BP: (93-119)/(53-73) 115/69 mmHg (08/19 0455) SpO2:  [94 %-97 %] 94 % (08/19 0455) Last BM Date: 06/02/13   Physical Exam General appearance: alert and no distress Resp: clear to auscultation bilaterally Cardio: regular rate and rhythm GI: Soft, mild diffuse TTP, +BS Extremities: NVI   Assessment/Plan: Fall off ladder  T12 burst fx - Dr. Joya Salm following, recommended TLSO brace, will clarify don/doff status. OOB with assistance  Left radius fx - Non-operative, WBAT through elbow Calcaneous fx - Non-operative, NWB Osteopenia - needs to f/u with PCP as OP, on calcium and vitamin D  Tobacco use - hold off on nicotine patch due to delayed bone healing  VTE - SCD's, Lovenox  FEN - Regular diet but unsure of reason for cramping, will follow Dispo -- Needs SNF but will have to figure out what to do with mother for whom she is primary caregiver. However, that need not hold up SNF placement, SW to see    Lisette Abu, PA-C Pager: [phone] General Trauma PA Pager: [phone]   06/05/2013

## 2013-06-05 NOTE — Progress Notes (Signed)
Occupational Therapy Treatment Patient Details Name: Kayla Mack MRN: [id number] DOB: 04-Mar-1946 Today's Date: 06/05/2013 Time: 1517-6160 [id number] x3 attempts today and seen on 4th) OT Time Calculation (min): 13 min  OT Assessment / Plan / Recommendation  History of present illness 67 YO FEMALE S/P FALL FROM LADDER HANGING A PICTURE. PT PRESENTES WITH rT CALCANEUS FX, LT RADIUS FX, AND T12 BURST FX. Pt With TLSO to be don in supine currently. 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    OT comments  Pt progressing well with therapy this session. Pt demonstrates ability with staff to transfer to commode in bathroom. Pt must have oxygen tank and back brace. Pt requires 4 L of O2 to keep saturations in the 80s  Follow Up Recommendations  SNF;Supervision/Assistance - 24 hour    Barriers to Discharge       Equipment Recommendations  3 in 1 bedside comode;Other (comment);Wheelchair (measurements OT);Wheelchair cushion (measurements OT)    Recommendations for Other Services    Frequency Min 3X/week   Progress towards OT Goals Progress towards OT goals: Progressing toward goals  Plan Discharge plan remains appropriate    Precautions / Restrictions Precautions Precautions: Fall;Back Required Braces or Orthoses: Spinal Brace;Other Brace/Splint Spinal Brace: Thoracolumbosacral orthotic;Applied in supine position Other Brace/Splint: Sugartong splint on Left UE and ED cast on RT LE at this time Restrictions LUE Weight Bearing: Weight bear through elbow only RLE Weight Bearing: Non weight bearing Other Position/Activity Restrictions: Left UE weight bearing at ELBOW only   Pertinent Vitals/Pain 2 or 3 out 10    ADL  Eating/Feeding: Set up Where Assessed - Eating/Feeding: Chair Grooming: Wash/dry hands;Minimal assistance Where Assessed - Grooming: Supported standing Toilet Transfer: Magazine features editor Method: Sit to Loss adjuster, chartered:  Regular height toilet Toileting - Water quality scientist and Hygiene: Min guard Where Assessed - Best boy and Hygiene: Sit to stand from 3-in-1 or toilet Equipment Used: Back brace;Rolling walker;Gait belt (platform) Transfers/Ambulation Related to ADLs: pt ambulated ~15 ft during session transfer from chair to toilet to sink to bed level. Pt using platform and 100% maintaining NWB on Rt LE ADL Comments: Pt reports finally have a position in chair that is comfortable and enjoyed sitting up. Pt completed toilet transfer and demonstrates ability to complete. Pt requires 4L of oxygen with all mobility. Pt progressing well    OT Diagnosis:    OT Problem List:   OT Treatment Interventions:     OT Goals(current goals can now be found in the care plan section) Acute Rehab OT Goals Patient Stated Goal: to get better OT Goal Formulation: With patient Time For Goal Achievement: 06/18/13 Potential to Achieve Goals: Good ADL Goals Pt Will Perform Grooming: with min guard assist;sitting;standing Pt Will Perform Upper Body Bathing: with min guard assist;sitting;standing Pt Will Perform Lower Body Bathing: with mod assist;with adaptive equipment;bed level Pt Will Perform Upper Body Dressing: with min guard assist;sitting Pt Will Perform Lower Body Dressing: with adaptive equipment;with mod assist;bed level Pt Will Transfer to Toilet: with min guard assist;bedside commode  Visit Information  Last OT Received On: 06/05/13 Assistance Needed: +1 History of Present Illness: 67 YO FEMALE S/P FALL FROM LADDER HANGING A PICTURE. PT PRESENTES WITH rT CALCANEUS FX, LT RADIUS FX, AND T12 BURST FX. Pt With TLSO to be don in supine currently. 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     Subjective Data      Prior  Functioning       Cognition  Cognition Arousal/Alertness: Awake/alert Behavior During Therapy: WFL for tasks assessed/performed Overall Cognitive  Status: Within Functional Limits for tasks assessed    Mobility  Bed Mobility Bed Mobility: Rolling Right;Rolling Left;Right Sidelying to Sit Rolling Right: 5: Supervision Rolling Left: 5: Supervision Right Sidelying to Sit: 4: Min assist Supine to Sit: 4: Min guard Sit to Supine: 4: Min assist;HOB flat Details for Bed Mobility Assistance: (A) to doff brace in supine. Pt mod I for log roll Rt and Lt Transfers Sit to Stand: 4: Min guard;With upper extremity assist;From chair/3-in-1 Stand to Sit: 4: Min guard;With upper extremity assist;To bed Details for Transfer Assistance: v/c to decr speed and incr safety. Pt moves fast    Exercises  Other Exercises Other Exercises: incentive spirometer x10 with cues for technique; able to reach 750 mL, goal for 1000 mL   Balance     End of Session OT - End of Session Activity Tolerance: Patient tolerated treatment well Patient left: with call bell/phone within reach;in bed;with bed alarm set Nurse Communication: Mobility status  GO     Sharol Harness [facility] 06/05/2013, 3:06 PM Pager: [phone]

## 2013-06-05 NOTE — Progress Notes (Signed)
Patient ID: Kayla Mack, female   DOB: 1946-03-02, 67 y.o.   MRN: [id number] Having a lot of pain while walking. To get xray

## 2013-06-05 NOTE — Progress Notes (Signed)
Orthopedic Tech Progress Note Patient Details:  Kayla Mack 1946-04-21 [id number]  Patient ID: Kayla Mack, female   DOB: 01/18/1946, 67 y.o.   MRN: [id number]   Irish Elders 06/05/2013, 12:56 PMTLSO COMPLETED BY BIO[facility]TE[facility] .

## 2013-06-05 NOTE — Clinical Social Work Psychosocial (Signed)
     Clinical Social Work Department BRIEF PSYCHOSOCIAL ASSESSMENT 06/05/2013  Patient:  Kayla Mack, Kayla Mack     Account Number:  [id number]     Admit date:  06/02/2013  Clinical Social Worker:  Elana Alm, Surveyor, quantity CSW  Date/Time:  06/05/2013 01:00 PM  Referred by:  Physician  Date Referred:  06/05/2013 Referred for  SNF Placement  Psychosocial assessment   Other Referral:   Interview type:  Patient Other interview type:    PSYCHOSOCIAL DATA Living Status:  FAMILY Admitted from facility:   Level of care:   Primary support name:  has mother she cares for, name unknown Primary support relationship to patient:  PARENT Degree of support available:   limited support system, neice is coming from out of town to provide support    CURRENT CONCERNS Current Concerns  Post-Acute Placement  Other - See comment   Other Concerns:   Kayla Mack has a parent she cares for in the home. Per patient, her mom has a good mental state but requires a motorized wheel chair to get around. Patient's neice is coming from out of town to assist. I mentioned private pay opportunities for the parent as a Nature conservation officer. Kayla Mack will explore her options. We can provide support as needed as well.    SOCIAL WORK ASSESSMENT / PLAN Skilled nursing and rehab process was explained to the patient. She has agreed to a skilled facility search in this area. We will begin our search and provide bed offers when they are available.   Assessment/plan status:  Psychosocial Support/Ongoing Assessment of Needs Other assessment/ plan:   Information/referral to community resources:   provided a skilled facility list    PATIENTS/FAMILYS RESPONSE TO PLAN OF CARE: Kayla Mack was very appreciaitive of our assistance, is motivated to began her rehab.

## 2013-06-05 NOTE — Clinical Social Work Placement (Addendum)
Clinical Social Work Department CLINICAL SOCIAL WORK PLACEMENT NOTE 06/05/2013  Patient:  CHAYSE, GRACEY  Account Number:  [id number] Admit date:  06/02/2013  Clinical Social Worker:  Elana Alm, Surveyor, quantity CSW  Date/time:  06/05/2013 01:00 PM  Clinical Social Work is seeking post-discharge placement for this patient at the following level of care:   [place]   (*CSW will update this form in Epic as items are completed)   06/05/2013  Patient/family provided with [place] Department of Clinical Social Work's list of facilities offering this level of care within the geographic area requested by the patient (or if unable, by the patient's family).  06/05/2013  Patient/family informed of their freedom to choose among providers that offer the needed level of care, that participate in Medicare, Medicaid or managed care program needed by the patient, have an available bed and are willing to accept the patient.  06/05/2013  Patient/family informed of [facility]' ownership interest in [facility], as well as of the fact that they are under no obligation to receive care at this facility.  PASARR submitted to EDS on 06/05/2013 PASARR number received from EDS on 06/05/2013  FL2 transmitted to all facilities in geographic area requested by pt/family on  06/05/2013 FL2 transmitted to all facilities within larger geographic area on   Patient informed that his/her managed care company has contracts with or will negotiate with  certain facilities, including the following:     Patient/family informed of bed offers received:  06/05/2013 Patient chooses bed at  [facility] Physician recommends and patient chooses bed at    Patient to be transferred to [facility] on 06/06/2013   Patient to be transferred to facility by  Ambulance  The following physician request were entered in Epic:   Additional Comments:

## 2013-06-05 NOTE — Progress Notes (Signed)
Physical Therapy Treatment Patient Details Name: Kayla Mack MRN: [id number] DOB: 11/21/1945 Today's Date: 06/05/2013 Time: 1121-1200 PT Time Calculation (min): 39 min  PT Assessment / Plan / Recommendation  History of Present Illness 67 YO FEMALE S/P FALL FROM LADDER HANGING A PICTURE. PT PRESENTES WITH rT CALCANEUS FX, LT RADIUS FX, AND T12 BURST FX. Pt With TLSO to be don in supine currently. 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    PT Comments   Patient moving rather well. Still limited by decrease in SpO2 (pt dropped to 87% sitting EOB on 3 liters, I bumped her up to 4 liters during ambulation however she still dropped to 82% after walking 12 ft requiring seated rest and recovery). Educated her on pursed lip breathing and incentive spirometer. Patient also attempted coughing while holding onto pillow to splint for pain. Very motivated to become independent. Spent at least 15 minutes educating patient on and assisting pt to position with pillows to improve comfort sitting in recliner.   Follow Up Recommendations  SNF     Does the patient have the potential to tolerate intense rehabilitation     Barriers to Discharge        Equipment Recommendations       Recommendations for Other Services    Frequency Min 5X/week   Progress towards PT Goals Progress towards PT goals: Progressing toward goals  Plan Current plan remains appropriate    Precautions / Restrictions Precautions Precautions: Fall;Back Required Braces or Orthoses: Spinal Brace;Other Brace/Splint Spinal Brace: Thoracolumbosacral orthotic;Applied in supine position Other Brace/Splint: Sugartong splint on Left UE and ED cast on RT LE at this time Restrictions Weight Bearing Restrictions: Yes LUE Weight Bearing: Weight bear through elbow only RLE Weight Bearing: Non weight bearing Other Position/Activity Restrictions: Left UE weight bearing at ELBOW only   Pertinent Vitals/Pain Reports  improved comfort after efforts at repositioning with pillows in the chair   Mobility  Bed Mobility Bed Mobility: Rolling Right;Rolling Left;Right Sidelying to Sit Rolling Right: 5: Supervision Rolling Left: 5: Supervision Right Sidelying to Sit: 4: Min assist Details for Bed Mobility Assistance: cues for precautions as well as sequencing log roll, totalA to don brace in supine; cues to maintain NWB LUE Transfers Transfers: Sit to Stand;Stand to Sit Sit to Stand: 4: Min assist;With upper extremity assist;From bed;From chair/3-in-1;With armrests Stand to Sit: 4: Min assist;With upper extremity assist;To chair/3-in-1;With armrests Details for Transfer Assistance: minA due to pt impulsivity, v/c's for hand placement, min stability assist Ambulation/Gait Ambulation/Gait Assistance: 4: Min assist Ambulation Distance (Feet): 12 Feet Assistive device: Left platform walker Ambulation/Gait Assistance Details: cues for sequencing, pt dropped to 82% on 4 liters during ambulation; good ability to maintain NWB RLE Gait Pattern: Step-to pattern (hop-to pattern)    Exercises Other Exercises Other Exercises: incentive spirometer x10 with cues for technique; able to reach 750 mL, goal for 1000 mL    PT Goals (current goals can now be found in the care plan section)    Visit Information  Last PT Received On: 06/05/13 Assistance Needed: +1 History of Present Illness: 67 YO FEMALE S/P FALL FROM LADDER HANGING A PICTURE. PT PRESENTES WITH rT CALCANEUS FX, LT RADIUS FX, AND T12 BURST FX. Pt With TLSO to be don in supine currently. 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     Subjective Data      Cognition  Cognition Arousal/Alertness: Awake/alert Behavior During Therapy: WFL for tasks assessed/performed  Overall Cognitive Status: Within Functional Limits for tasks assessed    Balance     End of Session PT - End of Session Equipment Utilized During Treatment: Gait  belt;Back brace;Oxygen Activity Tolerance: Patient tolerated treatment well;Patient limited by fatigue;Patient limited by pain Patient left: in chair;with call bell/phone within reach Nurse Communication: Mobility status   GP     Lajuan Lines 06/05/2013, 12:13 PM

## 2013-06-05 NOTE — Progress Notes (Signed)
Rehab admissions - Evaluated for possible admission.  I spoke briefly with patient.  Noted PT and OT recommending SNF.  Patient does not have adequate social supports per her report to support in inpatient rehab admission.  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  Patient will need SNF level therapies.  Call me for questions.  #[phone]

## 2013-06-06 ENCOUNTER — Encounter (HOSPITAL_COMMUNITY): Payer: Self-pay | Admitting: General Surgery

## 2013-06-06 DIAGNOSIS — S92009A Unspecified fracture of unspecified calcaneus, initial encounter for closed fracture: Secondary | ICD-10-CM | POA: Diagnosis not present

## 2013-06-06 DIAGNOSIS — M6281 Muscle weakness (generalized): Secondary | ICD-10-CM | POA: Diagnosis not present

## 2013-06-06 DIAGNOSIS — S5290XB Unspecified fracture of unspecified forearm, initial encounter for open fracture type I or II: Secondary | ICD-10-CM | POA: Diagnosis not present

## 2013-06-06 DIAGNOSIS — R279 Unspecified lack of coordination: Secondary | ICD-10-CM | POA: Diagnosis not present

## 2013-06-06 DIAGNOSIS — S32009A Unspecified fracture of unspecified lumbar vertebra, initial encounter for closed fracture: Secondary | ICD-10-CM | POA: Diagnosis not present

## 2013-06-06 DIAGNOSIS — F411 Generalized anxiety disorder: Secondary | ICD-10-CM | POA: Diagnosis not present

## 2013-06-06 DIAGNOSIS — S82209A Unspecified fracture of shaft of unspecified tibia, initial encounter for closed fracture: Secondary | ICD-10-CM | POA: Diagnosis not present

## 2013-06-06 DIAGNOSIS — J449 Chronic obstructive pulmonary disease, unspecified: Secondary | ICD-10-CM | POA: Diagnosis not present

## 2013-06-06 DIAGNOSIS — F329 Major depressive disorder, single episode, unspecified: Secondary | ICD-10-CM | POA: Diagnosis not present

## 2013-06-06 DIAGNOSIS — M545 Low back pain: Secondary | ICD-10-CM | POA: Diagnosis not present

## 2013-06-06 DIAGNOSIS — IMO0001 Reserved for inherently not codable concepts without codable children: Secondary | ICD-10-CM | POA: Diagnosis not present

## 2013-06-06 DIAGNOSIS — S5290XA Unspecified fracture of unspecified forearm, initial encounter for closed fracture: Secondary | ICD-10-CM | POA: Diagnosis not present

## 2013-06-06 DIAGNOSIS — F3289 Other specified depressive episodes: Secondary | ICD-10-CM | POA: Diagnosis not present

## 2013-06-06 DIAGNOSIS — I10 Essential (primary) hypertension: Secondary | ICD-10-CM | POA: Diagnosis not present

## 2013-06-06 DIAGNOSIS — S22009A Unspecified fracture of unspecified thoracic vertebra, initial encounter for closed fracture: Secondary | ICD-10-CM | POA: Diagnosis not present

## 2013-06-06 DIAGNOSIS — Z4789 Encounter for other orthopedic aftercare: Secondary | ICD-10-CM | POA: Diagnosis not present

## 2013-06-06 DIAGNOSIS — F4323 Adjustment disorder with mixed anxiety and depressed mood: Secondary | ICD-10-CM | POA: Diagnosis not present

## 2013-06-06 DIAGNOSIS — S52599A Other fractures of lower end of unspecified radius, initial encounter for closed fracture: Secondary | ICD-10-CM | POA: Diagnosis not present

## 2013-06-06 DIAGNOSIS — M48061 Spinal stenosis, lumbar region without neurogenic claudication: Secondary | ICD-10-CM | POA: Diagnosis not present

## 2013-06-06 DIAGNOSIS — D72829 Elevated white blood cell count, unspecified: Secondary | ICD-10-CM | POA: Diagnosis not present

## 2013-06-06 DIAGNOSIS — R262 Difficulty in walking, not elsewhere classified: Secondary | ICD-10-CM | POA: Diagnosis not present

## 2013-06-06 DIAGNOSIS — IMO0002 Reserved for concepts with insufficient information to code with codable children: Secondary | ICD-10-CM | POA: Diagnosis not present

## 2013-06-06 MED ORDER — CALCIUM CARBONATE-VITAMIN D 500-200 MG-UNIT PO TABS: 1.0000 | ORAL_TABLET | Freq: Two times a day (BID) | ORAL | Status: DC

## 2013-06-06 MED ORDER — CLONAZEPAM 0.5 MG PO TABS: 0.5000 mg | ORAL_TABLET | Freq: Two times a day (BID) | ORAL | Status: AC | PRN

## 2013-06-06 MED ORDER — ENSURE PUDDING PO PUDG
1.0000 | Freq: Two times a day (BID) | ORAL | Status: DC
  Administered 2013-06-06 (×2): 1 via ORAL

## 2013-06-06 MED ORDER — BISACODYL 10 MG RE SUPP: 10.0000 mg | Freq: Every day | RECTAL | Status: DC | PRN

## 2013-06-06 MED ORDER — METHOCARBAMOL 750 MG PO TABS: 750.0000 mg | ORAL_TABLET | Freq: Four times a day (QID) | ORAL | Status: DC | PRN

## 2013-06-06 MED ORDER — PANTOPRAZOLE SODIUM 40 MG PO TBEC: 40.0000 mg | DELAYED_RELEASE_TABLET | Freq: Every day | ORAL | Status: DC

## 2013-06-06 MED ORDER — POLYETHYLENE GLYCOL 3350 17 G PO PACK
17.0000 g | PACK | Freq: Every day | ORAL | Status: DC
  Administered 2013-06-06: 17 g via ORAL
  Filled 2013-06-06: qty 1

## 2013-06-06 MED ORDER — DSS 100 MG PO CAPS: 100.0000 mg | ORAL_CAPSULE | Freq: Two times a day (BID) | ORAL | Status: DC

## 2013-06-06 MED ORDER — SIMETHICONE 80 MG PO CHEW: 80.0000 mg | CHEWABLE_TABLET | Freq: Four times a day (QID) | ORAL | Status: DC | PRN

## 2013-06-06 MED ORDER — POLYETHYLENE GLYCOL 3350 17 G PO PACK: 17.0000 g | PACK | Freq: Every day | ORAL | Status: DC

## 2013-06-06 MED ORDER — SIMETHICONE 80 MG PO CHEW
80.0000 mg | CHEWABLE_TABLET | Freq: Four times a day (QID) | ORAL | Status: DC | PRN
  Administered 2013-06-06: 80 mg via ORAL
  Filled 2013-06-06: qty 1

## 2013-06-06 MED ORDER — HYDROCODONE-ACETAMINOPHEN 5-325 MG PO TABS: 0.5000 | ORAL_TABLET | ORAL | Status: DC | PRN

## 2013-06-06 MED ORDER — ENSURE PUDDING PO PUDG: 1.0000 | Freq: Two times a day (BID) | ORAL | Status: DC

## 2013-06-06 NOTE — Progress Notes (Signed)
Patient ID: Kayla Mack, female   DOB: December 19, 1945, 67 y.o.   MRN: [id number]   LOS: 4 days   Subjective: Pt complaining of gas, bloating and cramping. According to pt no bm since Saturday.  Still requiring oxygen.  Denies sob, fever, chills.  Denies dysuria.  Using IS.    Objective: Vital signs in last 24 hours: Temp:  [98.1 F (36.7 C)-99.3 F (37.4 C)] 98.2 F (36.8 C) (08/20 0545) Pulse Rate:  [71-85] 71 (08/20 0545) Resp:  [16-20] 16 (08/20 0545) BP: (95-118)/(53-66) 110/66 mmHg (08/20 0545) SpO2:  [82 %-99 %] 90 % (08/20 0545) Last BM Date: 06/02/13  Lab Results:  CBC  Recent Labs  06/03/13 1045  WBC 9.9  HGB 12.9  HCT 37.1  PLT 223   BMET  Recent Labs  06/03/13 1045  NA 134*  K 3.9  CL 101  CO2 24  GLUCOSE 127*  BUN 9  CREATININE 0.62  CALCIUM 8.7    Imaging: Dg Lumbar Spine 2-3 Views  06/05/2013   *RADIOLOGY REPORT*  Clinical Data: Fall.  T12 fracture.  LUMBAR SPINE - 2-3 VIEW  Comparison: CT 06/02/2013  Findings: Moderate compression fracture of T12.  Mild retropulsion is noted on the CT.  No lumbar fracture.  Mild anterior slip L4-5.  Disc degeneration with disc space narrowing and mild spurring at L3-4 and L4-5.  IMPRESSION: Moderately severe compression fracture of T12.  This appears acute on recent CT.  Lumbar degenerative change.  No lumbar fracture.   Original Report Authenticated By: Carl Best, M.D.   Physical Exam  General appearance: alert and no distress  Resp: clear to auscultation bilaterally  Cardio: s1s2 rrr no murmurs gallops or rubs.  +2 distal pulses no LLE edema. RLE dressing and therefore unable to assess. GI: Soft, mild diffuse TTP, +BS.  No masses or distention.  No evidence of peritonitis or incarcerated hernias.    Extremities: RLE dressing.  LUE dressing.  Distal pulses intact, able to move all extremities.  Skin is pink and warm. Neuro: normal  Patient Active Problem List   Diagnosis Date Noted  . Tobacco use disorder  06/04/2013  . T12 burst fracture 06/03/2013  . Left radius fracture 06/03/2013  . Right calcaneal fracture 06/03/2013  . Fall from ladder 06/03/2013  . Osteopenia of the elderly 06/03/2013   Assessment/Plan: Fall off ladder  T12 burst fx - Dr. Joya Salm following, recommended TLSO brace, will clarify don/doff status. OOB with assistance  Left radius fx - Non-operative, WBAT through elbow  Calcaneous fx - Non-operative, NWB  Osteopenia - needs to f/u with PCP as OP, on calcium and vitamin D  Tobacco use - hold off on nicotine patch due to delayed bone healing  Constipation-add miralax to regimen, PRN mylicon for gas.  Increase oral fluid intake, mobility and reduce narcotics when appropriate.  Hypoxia: continues to require oxygen, IS, mobilize. VTE - SCD's, Lovenox(? Continue at SNF and duration) FEN - Regular diet, appetite is poor.  I will add ensure BID between meals Dispo -speaking with mother and niece this morning regarding possible places, wants to be close to family, but understands availability limitations.  She is stable for discharge to SNF today.  SW to see.   Erby Pian, ANP-BC Pager: [phone] General Trauma PA Pager: [phone]   06/06/2013 8:58 AM

## 2013-06-06 NOTE — Discharge Summary (Signed)
This patient has been seen and I agree with the findings and treatment plan.  Kathryne Eriksson. Dahlia Bailiff, MD, [place] [phone] (pager) [phone] (direct pager) Trauma Surgeon

## 2013-06-06 NOTE — Progress Notes (Signed)
Patient discharged from room 4N26 at this time. Transferring to [place] via stretcher by [facility]. Instructions read to patient and niece and understanding verbalized. Niece signed copy. IV site d/c'd. Patient left in stable condition.

## 2013-06-06 NOTE — Progress Notes (Signed)
Patietn doing well enough for DC to SNF.  This patient has been seen and I agree with the findings and treatment plan.  Kathryne Eriksson. Dahlia Bailiff, MD, [place] [phone] (pager) [phone] (direct pager) Trauma Surgeon

## 2013-06-06 NOTE — Clinical Social Work Note (Signed)
Clinical Social Worker facilitated patient discharge including contacting patient family and facility to confirm patient discharge plans.  Clinical information faxed to facility and family agreeable with plan.  CSW arranged ambulance transport via [facility] to [facility] .  RN to call report prior to discharge.  Clinical Social Worker will sign off for now as social work intervention is no longer needed. Please consult [place] again if new need arises.  Barbette Or, [place]

## 2013-06-06 NOTE — Progress Notes (Signed)
Patient ID: Kayla Mack, female   DOB: 1946-01-15, 67 y.o.   MRN: [id number] Stable, still c/o lumbar pain. Got a TLSO. To  snf TODAY. I WILL SEE HER IN 4 TO 6 WEEKS OR BEFORE PRN. LUMBAR X-RAY SHOWS THE L1 fracture

## 2013-06-06 NOTE — Progress Notes (Signed)
Should transfer to SNF today.  This patient has been seen and I agree with the findings and treatment plan.  Kathryne Eriksson. Dahlia Bailiff, MD, [place] [phone] (pager) [phone] (direct pager) Trauma Surgeon

## 2013-06-06 NOTE — Discharge Summary (Signed)
Physician Discharge Summary  Patient ID: Kayla Mack MRN: [id number] DOB/AGE: 1946/01/15 67 y.o.  Admit date: 06/02/2013 Discharge date: 06/06/2013  Admission Diagnoses:  Fall of ladder T12 burst fracture Left radius fracture Calcaneous fracture COPD Tobacco use Osteopenia  Discharge Diagnoses:  Active Problems:   T12 burst fracture   Left radius fracture   Right calcaneal fracture   Fall from ladder   Tobacco use disorder   Osteopenia of the elderly   Discharged Condition: stable  Hospital Course: the patient has a history of depression, alcohol abuse, COPD, hypertension, anxiety, tobacco abuse.  She presented to [facility] after falling off a ladder.  She was found to have multiple fractures including T12 burst fracture, Dr. Joya Salm with Neurosurgery was consulted who recommend TLSO which she can apply at the edge of the bed before getting up.  She will need to call Dr. Harley Hallmark office to schedule a follow up.  She was also found to have a left radius fracture and minimally displaced right calcaneous fracture,  and felt to be non operative per orthopedic surgery.  She is non weight bearing to the LUE, but to bear weight as tolerated through elbow.  She was mobilized, but requires oxygen.  She is a known smoker and baseline pulmonary function is unknown.  She will continue with 02 to maintain sats >92%, pulmonary toilet and assess further need for it.  She is on Ca +vit D for osteopenia, she will need outpatient follow up for bisphosphonate therapy if deemed necessary.  Her pain was managed with oral pain medication.  She developed abdominal cramping and gas pains and was started on miralax and ensure for poor oral intake.  Her vital signs remained stable.  Labs remained stable.   Consults: orthopedic surgery-Dr. Percell Miller        Neurosurgery-Dr. Joya Salm  Significant Diagnostic Studies: please refer to radiologic imaging  Discharge Exam: Blood pressure 108/68, pulse 79, temperature  98.2 F (36.8 C), temperature source Oral, resp. rate 18, height _0  (1.575 m), weight 113 lb 8.6 oz (51.5 kg), SpO2 92.00%.   Disposition: SNF     Medication List         acetaminophen 500 MG tablet  Commonly known as:  TYLENOL  Take 1,000 mg by mouth 2 (two) times daily as needed for pain.     atenolol 50 MG tablet  Commonly known as:  TENORMIN  Take 50 mg by mouth every morning.     bisacodyl 10 MG suppository  Commonly known as:  DULCOLAX  Place 1 suppository (10 mg total) rectally daily as needed.     calcium-vitamin D 500-200 MG-UNIT per tablet  Commonly known as:  OSCAL WITH D  Take 1 tablet by mouth 2 (two) times daily.     clonazePAM 0.5 MG tablet  Commonly known as:  KLONOPIN  Take 0.5 mg by mouth 2 (two) times daily as needed for anxiety.     DSS 100 MG Caps  Take 100 mg by mouth 2 (two) times daily.     feeding supplement Pudg  Take 1 Container by mouth 2 (two) times daily between meals.     HYDROcodone-acetaminophen 5-325 MG per tablet  Commonly known as:  NORCO/VICODIN  Take 0.5-2 tablets by mouth every 4 (four) hours as needed.     lovastatin 40 MG tablet  Commonly known as:  MEVACOR  Take 40 mg by mouth every morning.     methocarbamol 750 MG tablet  Commonly known as:  ROBAXIN  Take 1 tablet (750 mg total) by mouth every 6 (six) hours as needed.     pantoprazole 40 MG tablet  Commonly known as:  PROTONIX  Take 1 tablet (40 mg total) by mouth daily.     polyethylene glycol packet  Commonly known as:  MIRALAX / GLYCOLAX  Take 17 g by mouth daily.     sertraline 100 MG tablet  Commonly known as:  ZOLOFT  Take 100 mg by mouth every morning.     simethicone 80 MG chewable tablet  Commonly known as:  MYLICON  Chew 1 tablet (80 mg total) by mouth 4 (four) times daily as needed for flatulence.           Follow-up Information   Follow up with [facility]. (As needed if symptoms worsen)    Contact information:   [place] [address] [phone]       Schedule an appointment as soon as possible for a visit with Floyce Stakes, MD.   Specialty:  Neurosurgery   Contact information:   [address] [place] [address] [place] [address] [phone]       Schedule an appointment as soon as possible for a visit with Renette Butters, MD.   Specialty:  Orthopedic Surgery   Contact information:   [address] [place] [address] [phone]      Total time: 37mns  Signed: Buffie Herne ANP-BC 06/06/2013, 10:40 AM

## 2013-06-06 NOTE — Progress Notes (Signed)
[name] has accepted a SNF bed at [facility]. Her niece will assist with the admission paperwork. Medical team notified.  Elana Alm, LCSW Assistant Director Clinical Social Work

## 2013-06-08 DIAGNOSIS — S5290XA Unspecified fracture of unspecified forearm, initial encounter for closed fracture: Secondary | ICD-10-CM | POA: Diagnosis not present

## 2013-06-08 DIAGNOSIS — S22009A Unspecified fracture of unspecified thoracic vertebra, initial encounter for closed fracture: Secondary | ICD-10-CM | POA: Diagnosis not present

## 2013-06-08 DIAGNOSIS — S92009A Unspecified fracture of unspecified calcaneus, initial encounter for closed fracture: Secondary | ICD-10-CM | POA: Diagnosis not present

## 2013-06-08 DIAGNOSIS — J449 Chronic obstructive pulmonary disease, unspecified: Secondary | ICD-10-CM | POA: Diagnosis not present

## 2013-06-12 DIAGNOSIS — S92009A Unspecified fracture of unspecified calcaneus, initial encounter for closed fracture: Secondary | ICD-10-CM | POA: Diagnosis not present

## 2013-06-12 DIAGNOSIS — J449 Chronic obstructive pulmonary disease, unspecified: Secondary | ICD-10-CM | POA: Diagnosis not present

## 2013-06-12 DIAGNOSIS — S5290XA Unspecified fracture of unspecified forearm, initial encounter for closed fracture: Secondary | ICD-10-CM | POA: Diagnosis not present

## 2013-06-12 DIAGNOSIS — S22009A Unspecified fracture of unspecified thoracic vertebra, initial encounter for closed fracture: Secondary | ICD-10-CM | POA: Diagnosis not present

## 2013-06-13 DIAGNOSIS — F4323 Adjustment disorder with mixed anxiety and depressed mood: Secondary | ICD-10-CM | POA: Diagnosis not present

## 2013-06-13 DIAGNOSIS — F329 Major depressive disorder, single episode, unspecified: Secondary | ICD-10-CM | POA: Diagnosis not present

## 2013-06-15 DIAGNOSIS — S92009A Unspecified fracture of unspecified calcaneus, initial encounter for closed fracture: Secondary | ICD-10-CM | POA: Diagnosis not present

## 2013-06-15 DIAGNOSIS — S52599A Other fractures of lower end of unspecified radius, initial encounter for closed fracture: Secondary | ICD-10-CM | POA: Diagnosis not present

## 2013-06-29 DIAGNOSIS — S5290XA Unspecified fracture of unspecified forearm, initial encounter for closed fracture: Secondary | ICD-10-CM | POA: Diagnosis not present

## 2013-06-29 DIAGNOSIS — J449 Chronic obstructive pulmonary disease, unspecified: Secondary | ICD-10-CM | POA: Diagnosis not present

## 2013-06-29 DIAGNOSIS — S92009A Unspecified fracture of unspecified calcaneus, initial encounter for closed fracture: Secondary | ICD-10-CM | POA: Diagnosis not present

## 2013-06-29 DIAGNOSIS — S22009A Unspecified fracture of unspecified thoracic vertebra, initial encounter for closed fracture: Secondary | ICD-10-CM | POA: Diagnosis not present

## 2013-07-04 DIAGNOSIS — S52599A Other fractures of lower end of unspecified radius, initial encounter for closed fracture: Secondary | ICD-10-CM | POA: Diagnosis not present

## 2013-07-04 DIAGNOSIS — S92009A Unspecified fracture of unspecified calcaneus, initial encounter for closed fracture: Secondary | ICD-10-CM | POA: Diagnosis not present

## 2013-07-05 DIAGNOSIS — M545 Low back pain: Secondary | ICD-10-CM | POA: Diagnosis not present

## 2013-07-05 DIAGNOSIS — IMO0002 Reserved for concepts with insufficient information to code with codable children: Secondary | ICD-10-CM | POA: Diagnosis not present

## 2013-07-09 DIAGNOSIS — S22009A Unspecified fracture of unspecified thoracic vertebra, initial encounter for closed fracture: Secondary | ICD-10-CM | POA: Diagnosis not present

## 2013-07-09 DIAGNOSIS — S92009A Unspecified fracture of unspecified calcaneus, initial encounter for closed fracture: Secondary | ICD-10-CM | POA: Diagnosis not present

## 2013-07-09 DIAGNOSIS — J449 Chronic obstructive pulmonary disease, unspecified: Secondary | ICD-10-CM | POA: Diagnosis not present

## 2013-07-09 DIAGNOSIS — S5290XA Unspecified fracture of unspecified forearm, initial encounter for closed fracture: Secondary | ICD-10-CM | POA: Diagnosis not present

## 2013-07-11 DIAGNOSIS — IMO0001 Reserved for inherently not codable concepts without codable children: Secondary | ICD-10-CM | POA: Diagnosis not present

## 2013-07-11 DIAGNOSIS — J449 Chronic obstructive pulmonary disease, unspecified: Secondary | ICD-10-CM | POA: Diagnosis not present

## 2013-07-11 DIAGNOSIS — R269 Unspecified abnormalities of gait and mobility: Secondary | ICD-10-CM | POA: Diagnosis not present

## 2013-07-11 DIAGNOSIS — F329 Major depressive disorder, single episode, unspecified: Secondary | ICD-10-CM | POA: Diagnosis not present

## 2013-07-11 DIAGNOSIS — S5290XD Unspecified fracture of unspecified forearm, subsequent encounter for closed fracture with routine healing: Secondary | ICD-10-CM | POA: Diagnosis not present

## 2013-07-11 DIAGNOSIS — K219 Gastro-esophageal reflux disease without esophagitis: Secondary | ICD-10-CM | POA: Diagnosis not present

## 2013-07-11 DIAGNOSIS — I10 Essential (primary) hypertension: Secondary | ICD-10-CM | POA: Diagnosis not present

## 2013-07-11 DIAGNOSIS — F411 Generalized anxiety disorder: Secondary | ICD-10-CM | POA: Diagnosis not present

## 2013-07-11 DIAGNOSIS — Z9181 History of falling: Secondary | ICD-10-CM | POA: Diagnosis not present

## 2013-07-11 DIAGNOSIS — IMO0002 Reserved for concepts with insufficient information to code with codable children: Secondary | ICD-10-CM | POA: Diagnosis not present

## 2013-07-12 DIAGNOSIS — IMO0002 Reserved for concepts with insufficient information to code with codable children: Secondary | ICD-10-CM | POA: Diagnosis not present

## 2013-07-12 DIAGNOSIS — K219 Gastro-esophageal reflux disease without esophagitis: Secondary | ICD-10-CM | POA: Diagnosis not present

## 2013-07-12 DIAGNOSIS — I10 Essential (primary) hypertension: Secondary | ICD-10-CM | POA: Diagnosis not present

## 2013-07-12 DIAGNOSIS — IMO0001 Reserved for inherently not codable concepts without codable children: Secondary | ICD-10-CM | POA: Diagnosis not present

## 2013-07-12 DIAGNOSIS — S5290XD Unspecified fracture of unspecified forearm, subsequent encounter for closed fracture with routine healing: Secondary | ICD-10-CM | POA: Diagnosis not present

## 2013-07-12 DIAGNOSIS — J449 Chronic obstructive pulmonary disease, unspecified: Secondary | ICD-10-CM | POA: Diagnosis not present

## 2013-07-13 DIAGNOSIS — K219 Gastro-esophageal reflux disease without esophagitis: Secondary | ICD-10-CM | POA: Diagnosis not present

## 2013-07-13 DIAGNOSIS — S5290XD Unspecified fracture of unspecified forearm, subsequent encounter for closed fracture with routine healing: Secondary | ICD-10-CM | POA: Diagnosis not present

## 2013-07-13 DIAGNOSIS — IMO0001 Reserved for inherently not codable concepts without codable children: Secondary | ICD-10-CM | POA: Diagnosis not present

## 2013-07-13 DIAGNOSIS — I10 Essential (primary) hypertension: Secondary | ICD-10-CM | POA: Diagnosis not present

## 2013-07-13 DIAGNOSIS — IMO0002 Reserved for concepts with insufficient information to code with codable children: Secondary | ICD-10-CM | POA: Diagnosis not present

## 2013-07-13 DIAGNOSIS — J449 Chronic obstructive pulmonary disease, unspecified: Secondary | ICD-10-CM | POA: Diagnosis not present

## 2013-07-17 DIAGNOSIS — IMO0002 Reserved for concepts with insufficient information to code with codable children: Secondary | ICD-10-CM | POA: Diagnosis not present

## 2013-07-17 DIAGNOSIS — J449 Chronic obstructive pulmonary disease, unspecified: Secondary | ICD-10-CM | POA: Diagnosis not present

## 2013-07-17 DIAGNOSIS — IMO0001 Reserved for inherently not codable concepts without codable children: Secondary | ICD-10-CM | POA: Diagnosis not present

## 2013-07-17 DIAGNOSIS — I10 Essential (primary) hypertension: Secondary | ICD-10-CM | POA: Diagnosis not present

## 2013-07-17 DIAGNOSIS — K219 Gastro-esophageal reflux disease without esophagitis: Secondary | ICD-10-CM | POA: Diagnosis not present

## 2013-07-17 DIAGNOSIS — S5290XD Unspecified fracture of unspecified forearm, subsequent encounter for closed fracture with routine healing: Secondary | ICD-10-CM | POA: Diagnosis not present

## 2013-07-18 DIAGNOSIS — J449 Chronic obstructive pulmonary disease, unspecified: Secondary | ICD-10-CM | POA: Diagnosis not present

## 2013-07-18 DIAGNOSIS — S5290XD Unspecified fracture of unspecified forearm, subsequent encounter for closed fracture with routine healing: Secondary | ICD-10-CM | POA: Diagnosis not present

## 2013-07-18 DIAGNOSIS — K219 Gastro-esophageal reflux disease without esophagitis: Secondary | ICD-10-CM | POA: Diagnosis not present

## 2013-07-18 DIAGNOSIS — F329 Major depressive disorder, single episode, unspecified: Secondary | ICD-10-CM | POA: Diagnosis not present

## 2013-07-18 DIAGNOSIS — M810 Age-related osteoporosis without current pathological fracture: Secondary | ICD-10-CM | POA: Diagnosis not present

## 2013-07-18 DIAGNOSIS — E559 Vitamin D deficiency, unspecified: Secondary | ICD-10-CM | POA: Diagnosis not present

## 2013-07-18 DIAGNOSIS — IMO0001 Reserved for inherently not codable concepts without codable children: Secondary | ICD-10-CM | POA: Diagnosis not present

## 2013-07-18 DIAGNOSIS — T148XXA Other injury of unspecified body region, initial encounter: Secondary | ICD-10-CM | POA: Diagnosis not present

## 2013-07-18 DIAGNOSIS — I10 Essential (primary) hypertension: Secondary | ICD-10-CM | POA: Diagnosis not present

## 2013-07-18 DIAGNOSIS — E7800 Pure hypercholesterolemia, unspecified: Secondary | ICD-10-CM | POA: Diagnosis not present

## 2013-07-18 DIAGNOSIS — IMO0002 Reserved for concepts with insufficient information to code with codable children: Secondary | ICD-10-CM | POA: Diagnosis not present

## 2013-07-18 DIAGNOSIS — R5381 Other malaise: Secondary | ICD-10-CM | POA: Diagnosis not present

## 2013-07-19 DIAGNOSIS — J449 Chronic obstructive pulmonary disease, unspecified: Secondary | ICD-10-CM | POA: Diagnosis not present

## 2013-07-19 DIAGNOSIS — S5290XD Unspecified fracture of unspecified forearm, subsequent encounter for closed fracture with routine healing: Secondary | ICD-10-CM | POA: Diagnosis not present

## 2013-07-19 DIAGNOSIS — IMO0002 Reserved for concepts with insufficient information to code with codable children: Secondary | ICD-10-CM | POA: Diagnosis not present

## 2013-07-19 DIAGNOSIS — IMO0001 Reserved for inherently not codable concepts without codable children: Secondary | ICD-10-CM | POA: Diagnosis not present

## 2013-07-19 DIAGNOSIS — K219 Gastro-esophageal reflux disease without esophagitis: Secondary | ICD-10-CM | POA: Diagnosis not present

## 2013-07-19 DIAGNOSIS — I10 Essential (primary) hypertension: Secondary | ICD-10-CM | POA: Diagnosis not present

## 2013-07-24 DIAGNOSIS — J449 Chronic obstructive pulmonary disease, unspecified: Secondary | ICD-10-CM | POA: Diagnosis not present

## 2013-07-24 DIAGNOSIS — I10 Essential (primary) hypertension: Secondary | ICD-10-CM | POA: Diagnosis not present

## 2013-07-24 DIAGNOSIS — IMO0002 Reserved for concepts with insufficient information to code with codable children: Secondary | ICD-10-CM | POA: Diagnosis not present

## 2013-07-24 DIAGNOSIS — IMO0001 Reserved for inherently not codable concepts without codable children: Secondary | ICD-10-CM | POA: Diagnosis not present

## 2013-07-24 DIAGNOSIS — K219 Gastro-esophageal reflux disease without esophagitis: Secondary | ICD-10-CM | POA: Diagnosis not present

## 2013-07-24 DIAGNOSIS — S5290XD Unspecified fracture of unspecified forearm, subsequent encounter for closed fracture with routine healing: Secondary | ICD-10-CM | POA: Diagnosis not present

## 2013-07-25 DIAGNOSIS — M25579 Pain in unspecified ankle and joints of unspecified foot: Secondary | ICD-10-CM | POA: Diagnosis not present

## 2013-07-25 DIAGNOSIS — S52599A Other fractures of lower end of unspecified radius, initial encounter for closed fracture: Secondary | ICD-10-CM | POA: Diagnosis not present

## 2013-07-26 DIAGNOSIS — IMO0001 Reserved for inherently not codable concepts without codable children: Secondary | ICD-10-CM | POA: Diagnosis not present

## 2013-07-26 DIAGNOSIS — IMO0002 Reserved for concepts with insufficient information to code with codable children: Secondary | ICD-10-CM | POA: Diagnosis not present

## 2013-07-26 DIAGNOSIS — S5290XD Unspecified fracture of unspecified forearm, subsequent encounter for closed fracture with routine healing: Secondary | ICD-10-CM | POA: Diagnosis not present

## 2013-07-26 DIAGNOSIS — K219 Gastro-esophageal reflux disease without esophagitis: Secondary | ICD-10-CM | POA: Diagnosis not present

## 2013-07-26 DIAGNOSIS — I10 Essential (primary) hypertension: Secondary | ICD-10-CM | POA: Diagnosis not present

## 2013-07-26 DIAGNOSIS — J449 Chronic obstructive pulmonary disease, unspecified: Secondary | ICD-10-CM | POA: Diagnosis not present

## 2013-07-31 DIAGNOSIS — I10 Essential (primary) hypertension: Secondary | ICD-10-CM | POA: Diagnosis not present

## 2013-07-31 DIAGNOSIS — IMO0002 Reserved for concepts with insufficient information to code with codable children: Secondary | ICD-10-CM | POA: Diagnosis not present

## 2013-07-31 DIAGNOSIS — J449 Chronic obstructive pulmonary disease, unspecified: Secondary | ICD-10-CM | POA: Diagnosis not present

## 2013-07-31 DIAGNOSIS — K219 Gastro-esophageal reflux disease without esophagitis: Secondary | ICD-10-CM | POA: Diagnosis not present

## 2013-07-31 DIAGNOSIS — S5290XD Unspecified fracture of unspecified forearm, subsequent encounter for closed fracture with routine healing: Secondary | ICD-10-CM | POA: Diagnosis not present

## 2013-07-31 DIAGNOSIS — IMO0001 Reserved for inherently not codable concepts without codable children: Secondary | ICD-10-CM | POA: Diagnosis not present

## 2013-08-01 DIAGNOSIS — I10 Essential (primary) hypertension: Secondary | ICD-10-CM | POA: Diagnosis not present

## 2013-08-01 DIAGNOSIS — S5290XD Unspecified fracture of unspecified forearm, subsequent encounter for closed fracture with routine healing: Secondary | ICD-10-CM | POA: Diagnosis not present

## 2013-08-01 DIAGNOSIS — K219 Gastro-esophageal reflux disease without esophagitis: Secondary | ICD-10-CM | POA: Diagnosis not present

## 2013-08-01 DIAGNOSIS — IMO0002 Reserved for concepts with insufficient information to code with codable children: Secondary | ICD-10-CM | POA: Diagnosis not present

## 2013-08-01 DIAGNOSIS — IMO0001 Reserved for inherently not codable concepts without codable children: Secondary | ICD-10-CM | POA: Diagnosis not present

## 2013-08-01 DIAGNOSIS — J449 Chronic obstructive pulmonary disease, unspecified: Secondary | ICD-10-CM | POA: Diagnosis not present

## 2013-08-02 DIAGNOSIS — IMO0002 Reserved for concepts with insufficient information to code with codable children: Secondary | ICD-10-CM | POA: Diagnosis not present

## 2013-08-02 DIAGNOSIS — Z6829 Body mass index (BMI) 29.0-29.9, adult: Secondary | ICD-10-CM | POA: Diagnosis not present

## 2013-08-04 DIAGNOSIS — IMO0001 Reserved for inherently not codable concepts without codable children: Secondary | ICD-10-CM | POA: Diagnosis not present

## 2013-08-04 DIAGNOSIS — J449 Chronic obstructive pulmonary disease, unspecified: Secondary | ICD-10-CM | POA: Diagnosis not present

## 2013-08-04 DIAGNOSIS — IMO0002 Reserved for concepts with insufficient information to code with codable children: Secondary | ICD-10-CM | POA: Diagnosis not present

## 2013-08-04 DIAGNOSIS — I10 Essential (primary) hypertension: Secondary | ICD-10-CM | POA: Diagnosis not present

## 2013-08-04 DIAGNOSIS — S5290XD Unspecified fracture of unspecified forearm, subsequent encounter for closed fracture with routine healing: Secondary | ICD-10-CM | POA: Diagnosis not present

## 2013-08-04 DIAGNOSIS — K219 Gastro-esophageal reflux disease without esophagitis: Secondary | ICD-10-CM | POA: Diagnosis not present

## 2013-08-09 DIAGNOSIS — Z780 Asymptomatic menopausal state: Secondary | ICD-10-CM | POA: Diagnosis not present

## 2013-08-09 DIAGNOSIS — Z1231 Encounter for screening mammogram for malignant neoplasm of breast: Secondary | ICD-10-CM | POA: Diagnosis not present

## 2013-08-09 DIAGNOSIS — Z8262 Family history of osteoporosis: Secondary | ICD-10-CM | POA: Diagnosis not present

## 2013-08-10 DIAGNOSIS — S52599A Other fractures of lower end of unspecified radius, initial encounter for closed fracture: Secondary | ICD-10-CM | POA: Diagnosis not present

## 2013-08-10 DIAGNOSIS — M25579 Pain in unspecified ankle and joints of unspecified foot: Secondary | ICD-10-CM | POA: Diagnosis not present

## 2013-08-10 DIAGNOSIS — S92009A Unspecified fracture of unspecified calcaneus, initial encounter for closed fracture: Secondary | ICD-10-CM | POA: Diagnosis not present

## 2013-08-10 DIAGNOSIS — IMO0001 Reserved for inherently not codable concepts without codable children: Secondary | ICD-10-CM | POA: Diagnosis not present

## 2013-08-10 DIAGNOSIS — S5290XD Unspecified fracture of unspecified forearm, subsequent encounter for closed fracture with routine healing: Secondary | ICD-10-CM | POA: Diagnosis not present

## 2013-08-10 DIAGNOSIS — IMO0002 Reserved for concepts with insufficient information to code with codable children: Secondary | ICD-10-CM | POA: Diagnosis not present

## 2013-08-10 DIAGNOSIS — I10 Essential (primary) hypertension: Secondary | ICD-10-CM | POA: Diagnosis not present

## 2013-08-10 DIAGNOSIS — J449 Chronic obstructive pulmonary disease, unspecified: Secondary | ICD-10-CM | POA: Diagnosis not present

## 2013-08-10 DIAGNOSIS — K219 Gastro-esophageal reflux disease without esophagitis: Secondary | ICD-10-CM | POA: Diagnosis not present

## 2013-08-15 DIAGNOSIS — S92009A Unspecified fracture of unspecified calcaneus, initial encounter for closed fracture: Secondary | ICD-10-CM | POA: Diagnosis not present

## 2013-08-15 DIAGNOSIS — M6281 Muscle weakness (generalized): Secondary | ICD-10-CM | POA: Diagnosis not present

## 2013-08-15 DIAGNOSIS — M25579 Pain in unspecified ankle and joints of unspecified foot: Secondary | ICD-10-CM | POA: Diagnosis not present

## 2013-08-15 DIAGNOSIS — S52599A Other fractures of lower end of unspecified radius, initial encounter for closed fracture: Secondary | ICD-10-CM | POA: Diagnosis not present

## 2013-08-17 DIAGNOSIS — S92009A Unspecified fracture of unspecified calcaneus, initial encounter for closed fracture: Secondary | ICD-10-CM | POA: Diagnosis not present

## 2013-08-17 DIAGNOSIS — M6281 Muscle weakness (generalized): Secondary | ICD-10-CM | POA: Diagnosis not present

## 2013-08-17 DIAGNOSIS — S52599A Other fractures of lower end of unspecified radius, initial encounter for closed fracture: Secondary | ICD-10-CM | POA: Diagnosis not present

## 2013-08-17 DIAGNOSIS — M25579 Pain in unspecified ankle and joints of unspecified foot: Secondary | ICD-10-CM | POA: Diagnosis not present

## 2013-08-20 DIAGNOSIS — F411 Generalized anxiety disorder: Secondary | ICD-10-CM | POA: Diagnosis not present

## 2013-08-20 DIAGNOSIS — E7800 Pure hypercholesterolemia, unspecified: Secondary | ICD-10-CM | POA: Diagnosis not present

## 2013-08-20 DIAGNOSIS — F329 Major depressive disorder, single episode, unspecified: Secondary | ICD-10-CM | POA: Diagnosis not present

## 2013-08-20 DIAGNOSIS — I10 Essential (primary) hypertension: Secondary | ICD-10-CM | POA: Diagnosis not present

## 2013-08-22 DIAGNOSIS — M25579 Pain in unspecified ankle and joints of unspecified foot: Secondary | ICD-10-CM | POA: Diagnosis not present

## 2013-08-31 DIAGNOSIS — F4323 Adjustment disorder with mixed anxiety and depressed mood: Secondary | ICD-10-CM | POA: Diagnosis not present

## 2013-08-31 DIAGNOSIS — F329 Major depressive disorder, single episode, unspecified: Secondary | ICD-10-CM | POA: Diagnosis not present

## 2013-09-04 DIAGNOSIS — F329 Major depressive disorder, single episode, unspecified: Secondary | ICD-10-CM | POA: Diagnosis not present

## 2013-09-04 DIAGNOSIS — F4323 Adjustment disorder with mixed anxiety and depressed mood: Secondary | ICD-10-CM | POA: Diagnosis not present

## 2013-09-19 DIAGNOSIS — F4323 Adjustment disorder with mixed anxiety and depressed mood: Secondary | ICD-10-CM | POA: Diagnosis not present

## 2013-09-19 DIAGNOSIS — F329 Major depressive disorder, single episode, unspecified: Secondary | ICD-10-CM | POA: Diagnosis not present

## 2013-09-24 DIAGNOSIS — F329 Major depressive disorder, single episode, unspecified: Secondary | ICD-10-CM | POA: Diagnosis not present

## 2013-09-24 DIAGNOSIS — F4323 Adjustment disorder with mixed anxiety and depressed mood: Secondary | ICD-10-CM | POA: Diagnosis not present

## 2013-10-02 DIAGNOSIS — F4323 Adjustment disorder with mixed anxiety and depressed mood: Secondary | ICD-10-CM | POA: Diagnosis not present

## 2013-10-02 DIAGNOSIS — F329 Major depressive disorder, single episode, unspecified: Secondary | ICD-10-CM | POA: Diagnosis not present

## 2013-10-08 DIAGNOSIS — E7800 Pure hypercholesterolemia, unspecified: Secondary | ICD-10-CM | POA: Diagnosis not present

## 2013-10-08 DIAGNOSIS — F329 Major depressive disorder, single episode, unspecified: Secondary | ICD-10-CM | POA: Diagnosis not present

## 2013-10-09 DIAGNOSIS — F4323 Adjustment disorder with mixed anxiety and depressed mood: Secondary | ICD-10-CM | POA: Diagnosis not present

## 2013-10-09 DIAGNOSIS — F329 Major depressive disorder, single episode, unspecified: Secondary | ICD-10-CM | POA: Diagnosis not present

## 2013-10-22 DIAGNOSIS — M949 Disorder of cartilage, unspecified: Secondary | ICD-10-CM | POA: Diagnosis not present

## 2013-10-22 DIAGNOSIS — E7800 Pure hypercholesterolemia, unspecified: Secondary | ICD-10-CM | POA: Diagnosis not present

## 2013-10-22 DIAGNOSIS — T148XXA Other injury of unspecified body region, initial encounter: Secondary | ICD-10-CM | POA: Diagnosis not present

## 2013-10-22 DIAGNOSIS — IMO0002 Reserved for concepts with insufficient information to code with codable children: Secondary | ICD-10-CM | POA: Diagnosis not present

## 2013-10-22 DIAGNOSIS — M899 Disorder of bone, unspecified: Secondary | ICD-10-CM | POA: Diagnosis not present

## 2013-10-23 DIAGNOSIS — F3289 Other specified depressive episodes: Secondary | ICD-10-CM | POA: Diagnosis not present

## 2013-10-23 DIAGNOSIS — F4323 Adjustment disorder with mixed anxiety and depressed mood: Secondary | ICD-10-CM | POA: Diagnosis not present

## 2013-10-23 DIAGNOSIS — F329 Major depressive disorder, single episode, unspecified: Secondary | ICD-10-CM | POA: Diagnosis not present

## 2013-10-29 DIAGNOSIS — Z86010 Personal history of colonic polyps: Secondary | ICD-10-CM | POA: Diagnosis not present

## 2013-11-01 DIAGNOSIS — I10 Essential (primary) hypertension: Secondary | ICD-10-CM | POA: Diagnosis not present

## 2013-11-01 DIAGNOSIS — M949 Disorder of cartilage, unspecified: Secondary | ICD-10-CM | POA: Diagnosis not present

## 2013-11-01 DIAGNOSIS — M899 Disorder of bone, unspecified: Secondary | ICD-10-CM | POA: Diagnosis not present

## 2013-11-01 DIAGNOSIS — E7800 Pure hypercholesterolemia, unspecified: Secondary | ICD-10-CM | POA: Diagnosis not present

## 2013-11-06 DIAGNOSIS — F3289 Other specified depressive episodes: Secondary | ICD-10-CM | POA: Diagnosis not present

## 2013-11-06 DIAGNOSIS — F329 Major depressive disorder, single episode, unspecified: Secondary | ICD-10-CM | POA: Diagnosis not present

## 2013-11-06 DIAGNOSIS — F4323 Adjustment disorder with mixed anxiety and depressed mood: Secondary | ICD-10-CM | POA: Diagnosis not present

## 2013-11-13 DIAGNOSIS — F329 Major depressive disorder, single episode, unspecified: Secondary | ICD-10-CM | POA: Diagnosis not present

## 2013-11-13 DIAGNOSIS — F4323 Adjustment disorder with mixed anxiety and depressed mood: Secondary | ICD-10-CM | POA: Diagnosis not present

## 2013-11-13 DIAGNOSIS — F3289 Other specified depressive episodes: Secondary | ICD-10-CM | POA: Diagnosis not present

## 2013-11-15 DIAGNOSIS — K5730 Diverticulosis of large intestine without perforation or abscess without bleeding: Secondary | ICD-10-CM | POA: Diagnosis not present

## 2013-11-15 DIAGNOSIS — Z1211 Encounter for screening for malignant neoplasm of colon: Secondary | ICD-10-CM | POA: Diagnosis not present

## 2013-11-15 DIAGNOSIS — D126 Benign neoplasm of colon, unspecified: Secondary | ICD-10-CM | POA: Diagnosis not present

## 2013-11-15 DIAGNOSIS — Z86010 Personal history of colonic polyps: Secondary | ICD-10-CM | POA: Diagnosis not present

## 2013-11-15 DIAGNOSIS — K649 Unspecified hemorrhoids: Secondary | ICD-10-CM | POA: Diagnosis not present

## 2013-11-20 DIAGNOSIS — F4323 Adjustment disorder with mixed anxiety and depressed mood: Secondary | ICD-10-CM | POA: Diagnosis not present

## 2013-11-20 DIAGNOSIS — F329 Major depressive disorder, single episode, unspecified: Secondary | ICD-10-CM | POA: Diagnosis not present

## 2013-11-20 DIAGNOSIS — F3289 Other specified depressive episodes: Secondary | ICD-10-CM | POA: Diagnosis not present

## 2013-11-22 DIAGNOSIS — F3289 Other specified depressive episodes: Secondary | ICD-10-CM | POA: Diagnosis not present

## 2013-11-22 DIAGNOSIS — F329 Major depressive disorder, single episode, unspecified: Secondary | ICD-10-CM | POA: Diagnosis not present

## 2013-11-22 DIAGNOSIS — F4323 Adjustment disorder with mixed anxiety and depressed mood: Secondary | ICD-10-CM | POA: Diagnosis not present

## 2013-11-27 DIAGNOSIS — F3289 Other specified depressive episodes: Secondary | ICD-10-CM | POA: Diagnosis not present

## 2013-11-27 DIAGNOSIS — F4323 Adjustment disorder with mixed anxiety and depressed mood: Secondary | ICD-10-CM | POA: Diagnosis not present

## 2013-11-27 DIAGNOSIS — F329 Major depressive disorder, single episode, unspecified: Secondary | ICD-10-CM | POA: Diagnosis not present

## 2013-12-11 DIAGNOSIS — F3289 Other specified depressive episodes: Secondary | ICD-10-CM | POA: Diagnosis not present

## 2013-12-11 DIAGNOSIS — F4323 Adjustment disorder with mixed anxiety and depressed mood: Secondary | ICD-10-CM | POA: Diagnosis not present

## 2013-12-11 DIAGNOSIS — F329 Major depressive disorder, single episode, unspecified: Secondary | ICD-10-CM | POA: Diagnosis not present

## 2013-12-18 DIAGNOSIS — F329 Major depressive disorder, single episode, unspecified: Secondary | ICD-10-CM | POA: Diagnosis not present

## 2013-12-18 DIAGNOSIS — F3289 Other specified depressive episodes: Secondary | ICD-10-CM | POA: Diagnosis not present

## 2013-12-18 DIAGNOSIS — F4323 Adjustment disorder with mixed anxiety and depressed mood: Secondary | ICD-10-CM | POA: Diagnosis not present

## 2013-12-25 DIAGNOSIS — F4323 Adjustment disorder with mixed anxiety and depressed mood: Secondary | ICD-10-CM | POA: Diagnosis not present

## 2013-12-25 DIAGNOSIS — F329 Major depressive disorder, single episode, unspecified: Secondary | ICD-10-CM | POA: Diagnosis not present

## 2013-12-25 DIAGNOSIS — F3289 Other specified depressive episodes: Secondary | ICD-10-CM | POA: Diagnosis not present

## 2014-01-01 DIAGNOSIS — F4323 Adjustment disorder with mixed anxiety and depressed mood: Secondary | ICD-10-CM | POA: Diagnosis not present

## 2014-01-01 DIAGNOSIS — F3289 Other specified depressive episodes: Secondary | ICD-10-CM | POA: Diagnosis not present

## 2014-01-01 DIAGNOSIS — F329 Major depressive disorder, single episode, unspecified: Secondary | ICD-10-CM | POA: Diagnosis not present

## 2014-01-08 DIAGNOSIS — F4323 Adjustment disorder with mixed anxiety and depressed mood: Secondary | ICD-10-CM | POA: Diagnosis not present

## 2014-01-08 DIAGNOSIS — F329 Major depressive disorder, single episode, unspecified: Secondary | ICD-10-CM | POA: Diagnosis not present

## 2014-01-08 DIAGNOSIS — F3289 Other specified depressive episodes: Secondary | ICD-10-CM | POA: Diagnosis not present

## 2014-01-15 DIAGNOSIS — F329 Major depressive disorder, single episode, unspecified: Secondary | ICD-10-CM | POA: Diagnosis not present

## 2014-01-15 DIAGNOSIS — F3289 Other specified depressive episodes: Secondary | ICD-10-CM | POA: Diagnosis not present

## 2014-01-15 DIAGNOSIS — F4323 Adjustment disorder with mixed anxiety and depressed mood: Secondary | ICD-10-CM | POA: Diagnosis not present

## 2014-01-22 DIAGNOSIS — F4323 Adjustment disorder with mixed anxiety and depressed mood: Secondary | ICD-10-CM | POA: Diagnosis not present

## 2014-01-22 DIAGNOSIS — F329 Major depressive disorder, single episode, unspecified: Secondary | ICD-10-CM | POA: Diagnosis not present

## 2014-01-22 DIAGNOSIS — F3289 Other specified depressive episodes: Secondary | ICD-10-CM | POA: Diagnosis not present

## 2014-01-29 DIAGNOSIS — F4323 Adjustment disorder with mixed anxiety and depressed mood: Secondary | ICD-10-CM | POA: Diagnosis not present

## 2014-01-29 DIAGNOSIS — F329 Major depressive disorder, single episode, unspecified: Secondary | ICD-10-CM | POA: Diagnosis not present

## 2014-01-29 DIAGNOSIS — F3289 Other specified depressive episodes: Secondary | ICD-10-CM | POA: Diagnosis not present

## 2014-02-05 DIAGNOSIS — F4323 Adjustment disorder with mixed anxiety and depressed mood: Secondary | ICD-10-CM | POA: Diagnosis not present

## 2014-02-05 DIAGNOSIS — F329 Major depressive disorder, single episode, unspecified: Secondary | ICD-10-CM | POA: Diagnosis not present

## 2014-02-05 DIAGNOSIS — F3289 Other specified depressive episodes: Secondary | ICD-10-CM | POA: Diagnosis not present

## 2014-02-07 DIAGNOSIS — F329 Major depressive disorder, single episode, unspecified: Secondary | ICD-10-CM | POA: Diagnosis not present

## 2014-02-07 DIAGNOSIS — F4323 Adjustment disorder with mixed anxiety and depressed mood: Secondary | ICD-10-CM | POA: Diagnosis not present

## 2014-02-07 DIAGNOSIS — F3289 Other specified depressive episodes: Secondary | ICD-10-CM | POA: Diagnosis not present

## 2014-02-11 DIAGNOSIS — I10 Essential (primary) hypertension: Secondary | ICD-10-CM | POA: Diagnosis not present

## 2014-02-11 DIAGNOSIS — IMO0002 Reserved for concepts with insufficient information to code with codable children: Secondary | ICD-10-CM | POA: Diagnosis not present

## 2014-02-12 DIAGNOSIS — F4323 Adjustment disorder with mixed anxiety and depressed mood: Secondary | ICD-10-CM | POA: Diagnosis not present

## 2014-02-12 DIAGNOSIS — F3289 Other specified depressive episodes: Secondary | ICD-10-CM | POA: Diagnosis not present

## 2014-02-12 DIAGNOSIS — F329 Major depressive disorder, single episode, unspecified: Secondary | ICD-10-CM | POA: Diagnosis not present

## 2014-02-19 DIAGNOSIS — F4323 Adjustment disorder with mixed anxiety and depressed mood: Secondary | ICD-10-CM | POA: Diagnosis not present

## 2014-02-19 DIAGNOSIS — F3289 Other specified depressive episodes: Secondary | ICD-10-CM | POA: Diagnosis not present

## 2014-02-19 DIAGNOSIS — F329 Major depressive disorder, single episode, unspecified: Secondary | ICD-10-CM | POA: Diagnosis not present

## 2014-03-05 DIAGNOSIS — F4323 Adjustment disorder with mixed anxiety and depressed mood: Secondary | ICD-10-CM | POA: Diagnosis not present

## 2014-03-05 DIAGNOSIS — F329 Major depressive disorder, single episode, unspecified: Secondary | ICD-10-CM | POA: Diagnosis not present

## 2014-03-05 DIAGNOSIS — F3289 Other specified depressive episodes: Secondary | ICD-10-CM | POA: Diagnosis not present

## 2014-03-12 DIAGNOSIS — F4323 Adjustment disorder with mixed anxiety and depressed mood: Secondary | ICD-10-CM | POA: Diagnosis not present

## 2014-03-12 DIAGNOSIS — F3289 Other specified depressive episodes: Secondary | ICD-10-CM | POA: Diagnosis not present

## 2014-03-12 DIAGNOSIS — F329 Major depressive disorder, single episode, unspecified: Secondary | ICD-10-CM | POA: Diagnosis not present

## 2014-03-19 DIAGNOSIS — F4323 Adjustment disorder with mixed anxiety and depressed mood: Secondary | ICD-10-CM | POA: Diagnosis not present

## 2014-03-19 DIAGNOSIS — F3289 Other specified depressive episodes: Secondary | ICD-10-CM | POA: Diagnosis not present

## 2014-03-19 DIAGNOSIS — F329 Major depressive disorder, single episode, unspecified: Secondary | ICD-10-CM | POA: Diagnosis not present

## 2014-03-26 DIAGNOSIS — F329 Major depressive disorder, single episode, unspecified: Secondary | ICD-10-CM | POA: Diagnosis not present

## 2014-03-26 DIAGNOSIS — F3289 Other specified depressive episodes: Secondary | ICD-10-CM | POA: Diagnosis not present

## 2014-03-26 DIAGNOSIS — F4323 Adjustment disorder with mixed anxiety and depressed mood: Secondary | ICD-10-CM | POA: Diagnosis not present

## 2014-04-02 DIAGNOSIS — F4323 Adjustment disorder with mixed anxiety and depressed mood: Secondary | ICD-10-CM | POA: Diagnosis not present

## 2014-04-02 DIAGNOSIS — F329 Major depressive disorder, single episode, unspecified: Secondary | ICD-10-CM | POA: Diagnosis not present

## 2014-04-02 DIAGNOSIS — F3289 Other specified depressive episodes: Secondary | ICD-10-CM | POA: Diagnosis not present

## 2014-04-09 DIAGNOSIS — F4323 Adjustment disorder with mixed anxiety and depressed mood: Secondary | ICD-10-CM | POA: Diagnosis not present

## 2014-04-09 DIAGNOSIS — F329 Major depressive disorder, single episode, unspecified: Secondary | ICD-10-CM | POA: Diagnosis not present

## 2014-04-09 DIAGNOSIS — F3289 Other specified depressive episodes: Secondary | ICD-10-CM | POA: Diagnosis not present

## 2014-04-16 DIAGNOSIS — F4323 Adjustment disorder with mixed anxiety and depressed mood: Secondary | ICD-10-CM | POA: Diagnosis not present

## 2014-04-16 DIAGNOSIS — F329 Major depressive disorder, single episode, unspecified: Secondary | ICD-10-CM | POA: Diagnosis not present

## 2014-04-16 DIAGNOSIS — F3289 Other specified depressive episodes: Secondary | ICD-10-CM | POA: Diagnosis not present

## 2014-04-23 DIAGNOSIS — F329 Major depressive disorder, single episode, unspecified: Secondary | ICD-10-CM | POA: Diagnosis not present

## 2014-04-23 DIAGNOSIS — F4323 Adjustment disorder with mixed anxiety and depressed mood: Secondary | ICD-10-CM | POA: Diagnosis not present

## 2014-04-23 DIAGNOSIS — F3289 Other specified depressive episodes: Secondary | ICD-10-CM | POA: Diagnosis not present

## 2014-05-05 DIAGNOSIS — F3289 Other specified depressive episodes: Secondary | ICD-10-CM | POA: Diagnosis not present

## 2014-05-05 DIAGNOSIS — F329 Major depressive disorder, single episode, unspecified: Secondary | ICD-10-CM | POA: Diagnosis not present

## 2014-05-05 DIAGNOSIS — F4323 Adjustment disorder with mixed anxiety and depressed mood: Secondary | ICD-10-CM | POA: Diagnosis not present

## 2014-05-07 DIAGNOSIS — F3289 Other specified depressive episodes: Secondary | ICD-10-CM | POA: Diagnosis not present

## 2014-05-07 DIAGNOSIS — F4323 Adjustment disorder with mixed anxiety and depressed mood: Secondary | ICD-10-CM | POA: Diagnosis not present

## 2014-05-07 DIAGNOSIS — F329 Major depressive disorder, single episode, unspecified: Secondary | ICD-10-CM | POA: Diagnosis not present

## 2014-05-28 DIAGNOSIS — F3289 Other specified depressive episodes: Secondary | ICD-10-CM | POA: Diagnosis not present

## 2014-05-28 DIAGNOSIS — F4323 Adjustment disorder with mixed anxiety and depressed mood: Secondary | ICD-10-CM | POA: Diagnosis not present

## 2014-05-28 DIAGNOSIS — F329 Major depressive disorder, single episode, unspecified: Secondary | ICD-10-CM | POA: Diagnosis not present

## 2014-06-04 DIAGNOSIS — D235 Other benign neoplasm of skin of trunk: Secondary | ICD-10-CM | POA: Diagnosis not present

## 2014-06-04 DIAGNOSIS — L819 Disorder of pigmentation, unspecified: Secondary | ICD-10-CM | POA: Diagnosis not present

## 2014-06-04 DIAGNOSIS — D485 Neoplasm of uncertain behavior of skin: Secondary | ICD-10-CM | POA: Diagnosis not present

## 2014-06-04 DIAGNOSIS — F3289 Other specified depressive episodes: Secondary | ICD-10-CM | POA: Diagnosis not present

## 2014-06-04 DIAGNOSIS — F329 Major depressive disorder, single episode, unspecified: Secondary | ICD-10-CM | POA: Diagnosis not present

## 2014-06-04 DIAGNOSIS — F4323 Adjustment disorder with mixed anxiety and depressed mood: Secondary | ICD-10-CM | POA: Diagnosis not present

## 2014-06-04 DIAGNOSIS — L570 Actinic keratosis: Secondary | ICD-10-CM | POA: Diagnosis not present

## 2014-06-04 DIAGNOSIS — L821 Other seborrheic keratosis: Secondary | ICD-10-CM | POA: Diagnosis not present

## 2014-06-11 DIAGNOSIS — F329 Major depressive disorder, single episode, unspecified: Secondary | ICD-10-CM | POA: Diagnosis not present

## 2014-06-11 DIAGNOSIS — F3289 Other specified depressive episodes: Secondary | ICD-10-CM | POA: Diagnosis not present

## 2014-06-11 DIAGNOSIS — F4323 Adjustment disorder with mixed anxiety and depressed mood: Secondary | ICD-10-CM | POA: Diagnosis not present

## 2014-06-18 DIAGNOSIS — F4323 Adjustment disorder with mixed anxiety and depressed mood: Secondary | ICD-10-CM | POA: Diagnosis not present

## 2014-06-18 DIAGNOSIS — F329 Major depressive disorder, single episode, unspecified: Secondary | ICD-10-CM | POA: Diagnosis not present

## 2014-06-18 DIAGNOSIS — F3289 Other specified depressive episodes: Secondary | ICD-10-CM | POA: Diagnosis not present

## 2014-06-27 DIAGNOSIS — F4323 Adjustment disorder with mixed anxiety and depressed mood: Secondary | ICD-10-CM | POA: Diagnosis not present

## 2014-06-27 DIAGNOSIS — F3289 Other specified depressive episodes: Secondary | ICD-10-CM | POA: Diagnosis not present

## 2014-06-27 DIAGNOSIS — F329 Major depressive disorder, single episode, unspecified: Secondary | ICD-10-CM | POA: Diagnosis not present

## 2014-07-02 DIAGNOSIS — F4323 Adjustment disorder with mixed anxiety and depressed mood: Secondary | ICD-10-CM | POA: Diagnosis not present

## 2014-07-02 DIAGNOSIS — F329 Major depressive disorder, single episode, unspecified: Secondary | ICD-10-CM | POA: Diagnosis not present

## 2014-07-02 DIAGNOSIS — F3289 Other specified depressive episodes: Secondary | ICD-10-CM | POA: Diagnosis not present

## 2014-07-09 DIAGNOSIS — F3289 Other specified depressive episodes: Secondary | ICD-10-CM | POA: Diagnosis not present

## 2014-07-09 DIAGNOSIS — F4323 Adjustment disorder with mixed anxiety and depressed mood: Secondary | ICD-10-CM | POA: Diagnosis not present

## 2014-07-09 DIAGNOSIS — F329 Major depressive disorder, single episode, unspecified: Secondary | ICD-10-CM | POA: Diagnosis not present

## 2014-07-16 DIAGNOSIS — F4323 Adjustment disorder with mixed anxiety and depressed mood: Secondary | ICD-10-CM | POA: Diagnosis not present

## 2014-07-16 DIAGNOSIS — F329 Major depressive disorder, single episode, unspecified: Secondary | ICD-10-CM | POA: Diagnosis not present

## 2014-07-16 DIAGNOSIS — F3289 Other specified depressive episodes: Secondary | ICD-10-CM | POA: Diagnosis not present

## 2014-07-23 DIAGNOSIS — F329 Major depressive disorder, single episode, unspecified: Secondary | ICD-10-CM | POA: Diagnosis not present

## 2014-07-23 DIAGNOSIS — F4323 Adjustment disorder with mixed anxiety and depressed mood: Secondary | ICD-10-CM | POA: Diagnosis not present

## 2014-07-30 DIAGNOSIS — F329 Major depressive disorder, single episode, unspecified: Secondary | ICD-10-CM | POA: Diagnosis not present

## 2014-07-30 DIAGNOSIS — F4323 Adjustment disorder with mixed anxiety and depressed mood: Secondary | ICD-10-CM | POA: Diagnosis not present

## 2014-08-06 DIAGNOSIS — F329 Major depressive disorder, single episode, unspecified: Secondary | ICD-10-CM | POA: Diagnosis not present

## 2014-08-06 DIAGNOSIS — F4323 Adjustment disorder with mixed anxiety and depressed mood: Secondary | ICD-10-CM | POA: Diagnosis not present

## 2014-08-13 DIAGNOSIS — F4323 Adjustment disorder with mixed anxiety and depressed mood: Secondary | ICD-10-CM | POA: Diagnosis not present

## 2014-08-13 DIAGNOSIS — F329 Major depressive disorder, single episode, unspecified: Secondary | ICD-10-CM | POA: Diagnosis not present

## 2014-09-03 DIAGNOSIS — F329 Major depressive disorder, single episode, unspecified: Secondary | ICD-10-CM | POA: Diagnosis not present

## 2014-09-03 DIAGNOSIS — F4323 Adjustment disorder with mixed anxiety and depressed mood: Secondary | ICD-10-CM | POA: Diagnosis not present

## 2014-09-17 DIAGNOSIS — F329 Major depressive disorder, single episode, unspecified: Secondary | ICD-10-CM | POA: Diagnosis not present

## 2014-09-17 DIAGNOSIS — F4323 Adjustment disorder with mixed anxiety and depressed mood: Secondary | ICD-10-CM | POA: Diagnosis not present

## 2014-09-24 DIAGNOSIS — F329 Major depressive disorder, single episode, unspecified: Secondary | ICD-10-CM | POA: Diagnosis not present

## 2014-09-24 DIAGNOSIS — F4323 Adjustment disorder with mixed anxiety and depressed mood: Secondary | ICD-10-CM | POA: Diagnosis not present

## 2014-10-01 DIAGNOSIS — F4323 Adjustment disorder with mixed anxiety and depressed mood: Secondary | ICD-10-CM | POA: Diagnosis not present

## 2014-10-01 DIAGNOSIS — F329 Major depressive disorder, single episode, unspecified: Secondary | ICD-10-CM | POA: Diagnosis not present

## 2014-10-09 DIAGNOSIS — F329 Major depressive disorder, single episode, unspecified: Secondary | ICD-10-CM | POA: Diagnosis not present

## 2014-10-09 DIAGNOSIS — F4323 Adjustment disorder with mixed anxiety and depressed mood: Secondary | ICD-10-CM | POA: Diagnosis not present

## 2014-10-24 DIAGNOSIS — F329 Major depressive disorder, single episode, unspecified: Secondary | ICD-10-CM | POA: Diagnosis not present

## 2014-10-24 DIAGNOSIS — F4323 Adjustment disorder with mixed anxiety and depressed mood: Secondary | ICD-10-CM | POA: Diagnosis not present

## 2014-10-29 DIAGNOSIS — F4323 Adjustment disorder with mixed anxiety and depressed mood: Secondary | ICD-10-CM | POA: Diagnosis not present

## 2014-10-29 DIAGNOSIS — F329 Major depressive disorder, single episode, unspecified: Secondary | ICD-10-CM | POA: Diagnosis not present

## 2014-10-30 DIAGNOSIS — Z23 Encounter for immunization: Secondary | ICD-10-CM | POA: Diagnosis not present

## 2014-11-07 DIAGNOSIS — F4323 Adjustment disorder with mixed anxiety and depressed mood: Secondary | ICD-10-CM | POA: Diagnosis not present

## 2014-11-07 DIAGNOSIS — F329 Major depressive disorder, single episode, unspecified: Secondary | ICD-10-CM | POA: Diagnosis not present

## 2014-11-12 DIAGNOSIS — F4323 Adjustment disorder with mixed anxiety and depressed mood: Secondary | ICD-10-CM | POA: Diagnosis not present

## 2014-11-12 DIAGNOSIS — F329 Major depressive disorder, single episode, unspecified: Secondary | ICD-10-CM | POA: Diagnosis not present

## 2014-11-19 DIAGNOSIS — F329 Major depressive disorder, single episode, unspecified: Secondary | ICD-10-CM | POA: Diagnosis not present

## 2014-11-19 DIAGNOSIS — F4323 Adjustment disorder with mixed anxiety and depressed mood: Secondary | ICD-10-CM | POA: Diagnosis not present

## 2014-11-26 DIAGNOSIS — F329 Major depressive disorder, single episode, unspecified: Secondary | ICD-10-CM | POA: Diagnosis not present

## 2014-11-26 DIAGNOSIS — F4323 Adjustment disorder with mixed anxiety and depressed mood: Secondary | ICD-10-CM | POA: Diagnosis not present

## 2014-12-03 DIAGNOSIS — F329 Major depressive disorder, single episode, unspecified: Secondary | ICD-10-CM | POA: Diagnosis not present

## 2014-12-03 DIAGNOSIS — F4323 Adjustment disorder with mixed anxiety and depressed mood: Secondary | ICD-10-CM | POA: Diagnosis not present

## 2014-12-09 DIAGNOSIS — H2513 Age-related nuclear cataract, bilateral: Secondary | ICD-10-CM | POA: Diagnosis not present

## 2014-12-09 DIAGNOSIS — H52203 Unspecified astigmatism, bilateral: Secondary | ICD-10-CM | POA: Diagnosis not present

## 2014-12-09 DIAGNOSIS — H524 Presbyopia: Secondary | ICD-10-CM | POA: Diagnosis not present

## 2014-12-09 DIAGNOSIS — H5203 Hypermetropia, bilateral: Secondary | ICD-10-CM | POA: Diagnosis not present

## 2014-12-10 DIAGNOSIS — F4323 Adjustment disorder with mixed anxiety and depressed mood: Secondary | ICD-10-CM | POA: Diagnosis not present

## 2014-12-10 DIAGNOSIS — F329 Major depressive disorder, single episode, unspecified: Secondary | ICD-10-CM | POA: Diagnosis not present

## 2014-12-18 DIAGNOSIS — F4323 Adjustment disorder with mixed anxiety and depressed mood: Secondary | ICD-10-CM | POA: Diagnosis not present

## 2014-12-18 DIAGNOSIS — F329 Major depressive disorder, single episode, unspecified: Secondary | ICD-10-CM | POA: Diagnosis not present

## 2014-12-24 DIAGNOSIS — F329 Major depressive disorder, single episode, unspecified: Secondary | ICD-10-CM | POA: Diagnosis not present

## 2014-12-24 DIAGNOSIS — F4323 Adjustment disorder with mixed anxiety and depressed mood: Secondary | ICD-10-CM | POA: Diagnosis not present

## 2014-12-31 DIAGNOSIS — F329 Major depressive disorder, single episode, unspecified: Secondary | ICD-10-CM | POA: Diagnosis not present

## 2014-12-31 DIAGNOSIS — F4323 Adjustment disorder with mixed anxiety and depressed mood: Secondary | ICD-10-CM | POA: Diagnosis not present

## 2015-01-01 DIAGNOSIS — F4323 Adjustment disorder with mixed anxiety and depressed mood: Secondary | ICD-10-CM | POA: Diagnosis not present

## 2015-01-01 DIAGNOSIS — F329 Major depressive disorder, single episode, unspecified: Secondary | ICD-10-CM | POA: Diagnosis not present

## 2015-01-07 DIAGNOSIS — F4323 Adjustment disorder with mixed anxiety and depressed mood: Secondary | ICD-10-CM | POA: Diagnosis not present

## 2015-01-07 DIAGNOSIS — F329 Major depressive disorder, single episode, unspecified: Secondary | ICD-10-CM | POA: Diagnosis not present

## 2015-01-30 DIAGNOSIS — F4323 Adjustment disorder with mixed anxiety and depressed mood: Secondary | ICD-10-CM | POA: Diagnosis not present

## 2015-01-30 DIAGNOSIS — F329 Major depressive disorder, single episode, unspecified: Secondary | ICD-10-CM | POA: Diagnosis not present

## 2015-02-03 DIAGNOSIS — E780 Pure hypercholesterolemia: Secondary | ICD-10-CM | POA: Diagnosis not present

## 2015-02-03 DIAGNOSIS — F419 Anxiety disorder, unspecified: Secondary | ICD-10-CM | POA: Diagnosis not present

## 2015-02-03 DIAGNOSIS — R413 Other amnesia: Secondary | ICD-10-CM | POA: Diagnosis not present

## 2015-02-03 DIAGNOSIS — I10 Essential (primary) hypertension: Secondary | ICD-10-CM | POA: Diagnosis not present

## 2015-02-04 DIAGNOSIS — F4323 Adjustment disorder with mixed anxiety and depressed mood: Secondary | ICD-10-CM | POA: Diagnosis not present

## 2015-02-04 DIAGNOSIS — F329 Major depressive disorder, single episode, unspecified: Secondary | ICD-10-CM | POA: Diagnosis not present

## 2015-02-06 DIAGNOSIS — F329 Major depressive disorder, single episode, unspecified: Secondary | ICD-10-CM | POA: Diagnosis not present

## 2015-02-06 DIAGNOSIS — F4323 Adjustment disorder with mixed anxiety and depressed mood: Secondary | ICD-10-CM | POA: Diagnosis not present

## 2015-02-11 DIAGNOSIS — F4323 Adjustment disorder with mixed anxiety and depressed mood: Secondary | ICD-10-CM | POA: Diagnosis not present

## 2015-02-11 DIAGNOSIS — F329 Major depressive disorder, single episode, unspecified: Secondary | ICD-10-CM | POA: Diagnosis not present

## 2015-02-18 DIAGNOSIS — F4323 Adjustment disorder with mixed anxiety and depressed mood: Secondary | ICD-10-CM | POA: Diagnosis not present

## 2015-02-18 DIAGNOSIS — F329 Major depressive disorder, single episode, unspecified: Secondary | ICD-10-CM | POA: Diagnosis not present

## 2015-03-07 DIAGNOSIS — F329 Major depressive disorder, single episode, unspecified: Secondary | ICD-10-CM | POA: Diagnosis not present

## 2015-03-07 DIAGNOSIS — F4323 Adjustment disorder with mixed anxiety and depressed mood: Secondary | ICD-10-CM | POA: Diagnosis not present

## 2015-03-11 DIAGNOSIS — F329 Major depressive disorder, single episode, unspecified: Secondary | ICD-10-CM | POA: Diagnosis not present

## 2015-03-11 DIAGNOSIS — F4323 Adjustment disorder with mixed anxiety and depressed mood: Secondary | ICD-10-CM | POA: Diagnosis not present

## 2015-03-12 DIAGNOSIS — I10 Essential (primary) hypertension: Secondary | ICD-10-CM | POA: Diagnosis not present

## 2015-03-12 DIAGNOSIS — R413 Other amnesia: Secondary | ICD-10-CM | POA: Diagnosis not present

## 2015-03-12 DIAGNOSIS — F419 Anxiety disorder, unspecified: Secondary | ICD-10-CM | POA: Diagnosis not present

## 2015-03-18 DIAGNOSIS — F329 Major depressive disorder, single episode, unspecified: Secondary | ICD-10-CM | POA: Diagnosis not present

## 2015-03-18 DIAGNOSIS — F4323 Adjustment disorder with mixed anxiety and depressed mood: Secondary | ICD-10-CM | POA: Diagnosis not present

## 2015-03-18 DIAGNOSIS — I10 Essential (primary) hypertension: Secondary | ICD-10-CM | POA: Diagnosis not present

## 2015-03-18 DIAGNOSIS — E780 Pure hypercholesterolemia: Secondary | ICD-10-CM | POA: Diagnosis not present

## 2015-03-25 DIAGNOSIS — F4323 Adjustment disorder with mixed anxiety and depressed mood: Secondary | ICD-10-CM | POA: Diagnosis not present

## 2015-03-25 DIAGNOSIS — F329 Major depressive disorder, single episode, unspecified: Secondary | ICD-10-CM | POA: Diagnosis not present

## 2015-04-01 DIAGNOSIS — F4323 Adjustment disorder with mixed anxiety and depressed mood: Secondary | ICD-10-CM | POA: Diagnosis not present

## 2015-04-01 DIAGNOSIS — F329 Major depressive disorder, single episode, unspecified: Secondary | ICD-10-CM | POA: Diagnosis not present

## 2015-04-15 DIAGNOSIS — F4323 Adjustment disorder with mixed anxiety and depressed mood: Secondary | ICD-10-CM | POA: Diagnosis not present

## 2015-04-15 DIAGNOSIS — F329 Major depressive disorder, single episode, unspecified: Secondary | ICD-10-CM | POA: Diagnosis not present

## 2015-04-25 DIAGNOSIS — F329 Major depressive disorder, single episode, unspecified: Secondary | ICD-10-CM | POA: Diagnosis not present

## 2015-04-25 DIAGNOSIS — F4323 Adjustment disorder with mixed anxiety and depressed mood: Secondary | ICD-10-CM | POA: Diagnosis not present

## 2015-05-06 DIAGNOSIS — F4323 Adjustment disorder with mixed anxiety and depressed mood: Secondary | ICD-10-CM | POA: Diagnosis not present

## 2015-05-06 DIAGNOSIS — F329 Major depressive disorder, single episode, unspecified: Secondary | ICD-10-CM | POA: Diagnosis not present

## 2015-05-13 DIAGNOSIS — F4323 Adjustment disorder with mixed anxiety and depressed mood: Secondary | ICD-10-CM | POA: Diagnosis not present

## 2015-05-13 DIAGNOSIS — F329 Major depressive disorder, single episode, unspecified: Secondary | ICD-10-CM | POA: Diagnosis not present

## 2015-05-20 DIAGNOSIS — F4323 Adjustment disorder with mixed anxiety and depressed mood: Secondary | ICD-10-CM | POA: Diagnosis not present

## 2015-05-20 DIAGNOSIS — F329 Major depressive disorder, single episode, unspecified: Secondary | ICD-10-CM | POA: Diagnosis not present

## 2015-05-20 DIAGNOSIS — Z803 Family history of malignant neoplasm of breast: Secondary | ICD-10-CM | POA: Diagnosis not present

## 2015-05-20 DIAGNOSIS — Z1231 Encounter for screening mammogram for malignant neoplasm of breast: Secondary | ICD-10-CM | POA: Diagnosis not present

## 2015-05-27 DIAGNOSIS — F329 Major depressive disorder, single episode, unspecified: Secondary | ICD-10-CM | POA: Diagnosis not present

## 2015-05-27 DIAGNOSIS — F4323 Adjustment disorder with mixed anxiety and depressed mood: Secondary | ICD-10-CM | POA: Diagnosis not present

## 2015-06-03 DIAGNOSIS — F329 Major depressive disorder, single episode, unspecified: Secondary | ICD-10-CM | POA: Diagnosis not present

## 2015-06-03 DIAGNOSIS — F4323 Adjustment disorder with mixed anxiety and depressed mood: Secondary | ICD-10-CM | POA: Diagnosis not present

## 2015-06-10 DIAGNOSIS — F4323 Adjustment disorder with mixed anxiety and depressed mood: Secondary | ICD-10-CM | POA: Diagnosis not present

## 2015-06-10 DIAGNOSIS — F329 Major depressive disorder, single episode, unspecified: Secondary | ICD-10-CM | POA: Diagnosis not present

## 2015-06-17 DIAGNOSIS — F4323 Adjustment disorder with mixed anxiety and depressed mood: Secondary | ICD-10-CM | POA: Diagnosis not present

## 2015-06-17 DIAGNOSIS — F329 Major depressive disorder, single episode, unspecified: Secondary | ICD-10-CM | POA: Diagnosis not present

## 2015-06-24 DIAGNOSIS — F4323 Adjustment disorder with mixed anxiety and depressed mood: Secondary | ICD-10-CM | POA: Diagnosis not present

## 2015-06-24 DIAGNOSIS — F329 Major depressive disorder, single episode, unspecified: Secondary | ICD-10-CM | POA: Diagnosis not present

## 2015-07-02 DIAGNOSIS — F329 Major depressive disorder, single episode, unspecified: Secondary | ICD-10-CM | POA: Diagnosis not present

## 2015-07-02 DIAGNOSIS — F4323 Adjustment disorder with mixed anxiety and depressed mood: Secondary | ICD-10-CM | POA: Diagnosis not present

## 2015-07-08 DIAGNOSIS — F4323 Adjustment disorder with mixed anxiety and depressed mood: Secondary | ICD-10-CM | POA: Diagnosis not present

## 2015-07-08 DIAGNOSIS — F329 Major depressive disorder, single episode, unspecified: Secondary | ICD-10-CM | POA: Diagnosis not present

## 2015-07-15 DIAGNOSIS — F4323 Adjustment disorder with mixed anxiety and depressed mood: Secondary | ICD-10-CM | POA: Diagnosis not present

## 2015-07-15 DIAGNOSIS — F329 Major depressive disorder, single episode, unspecified: Secondary | ICD-10-CM | POA: Diagnosis not present

## 2015-07-21 IMAGING — CR DG WRIST COMPLETE 3+V*L*
4 series · 4 of 4 positions shown · non-contrast
Comparison: None.

CLINICAL DATA: Fall, left wrist pain

LEFT WRIST - COMPLETE 3+ VIEW

[x wrist pa left]
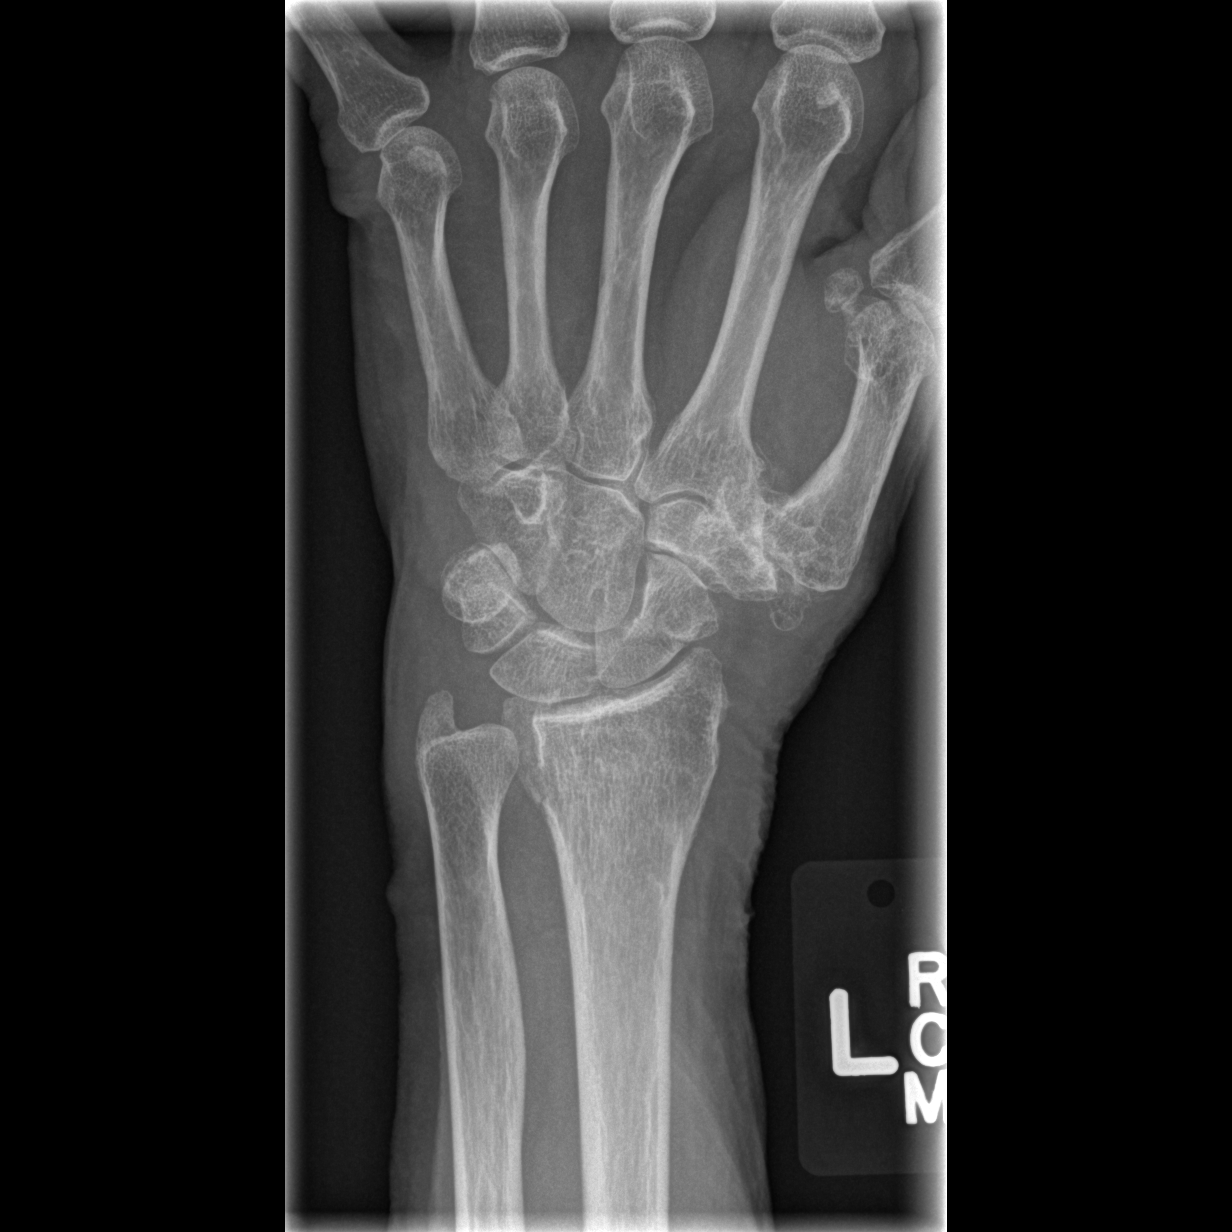

[x wrist obl left]
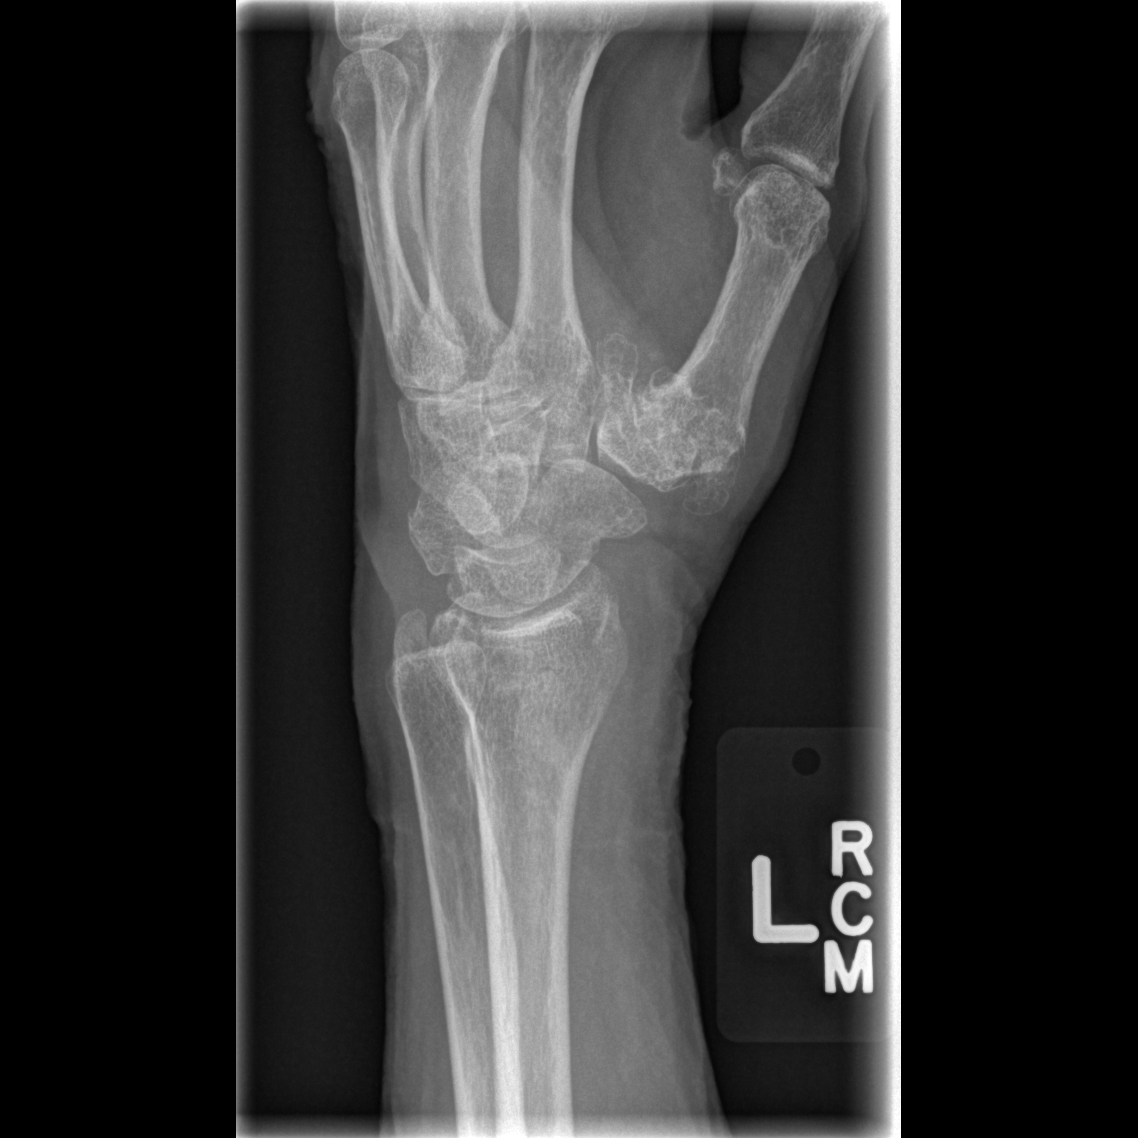

[x wrist lat left]
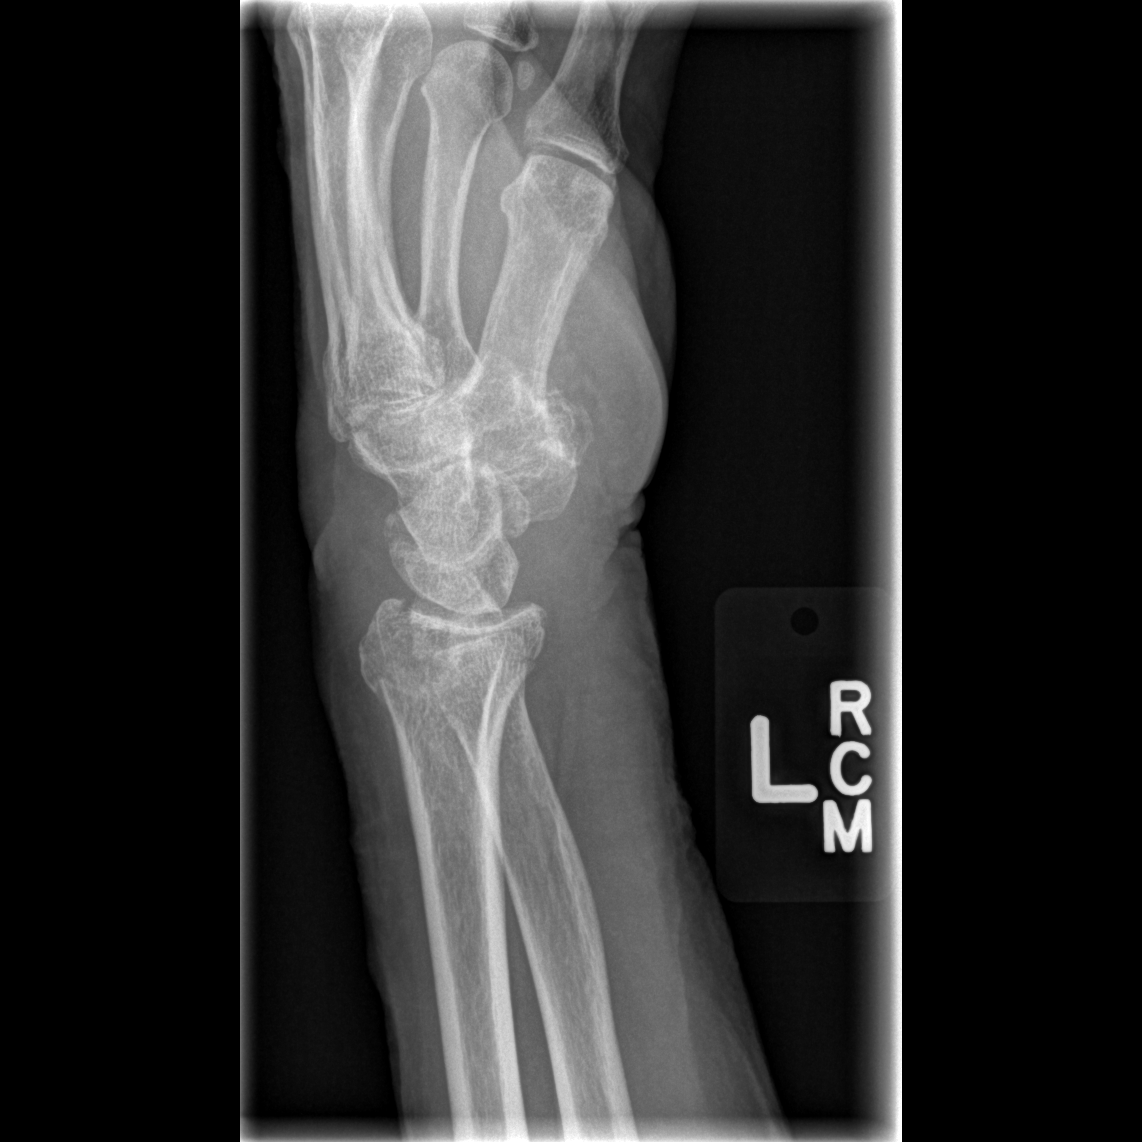

[x navicular]
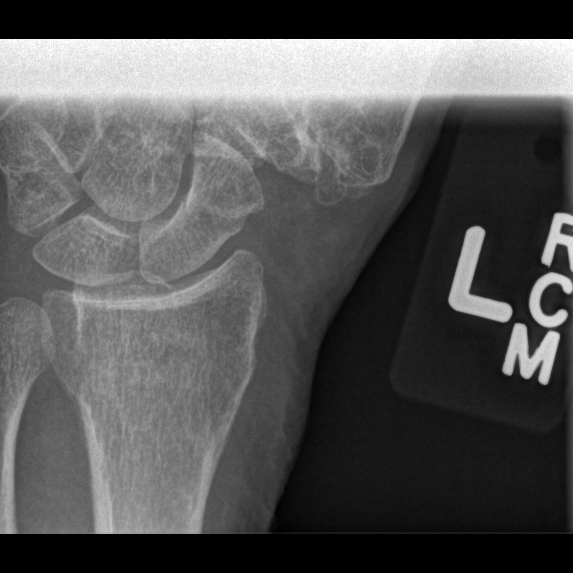

[4 of 4 positions shown; findings below may reference images not displayed]

FINDINGS: Four views of the left wrist submitted.  Study is limited by
diffuse osteopenia.  There is nondisplaced mild impacted fracture
in distal left radial metaphysis.  On lateral view the fracture
line is seen involving the distal articular surface of the radius.
Degenerative changes first carpal metacarpal joint.
IMPRESSION: There is nondisplaced mild impacted fracture in distal left radial
metaphysis.  On lateral view the fracture line is seen involving
the distal articular surface of the radius.]

## 2015-07-21 IMAGING — CT CT CHEST W/ CM
2 of 5 series · 13 of 36 positions shown, 16 images · IV contrast (APPLIED)
Comparison: {[REDACTED] chest radiographs
07/28/2009.}

CT CHEST

CLINICAL DATA: [67-year-old female status post fall from 6 feet
ladder.  Pain.]

CT CHEST, ABDOMEN AND PELVIS WITH CONTRAST
TECHNIQUE: Multidetector CT imaging of the chest, abdomen and
pelvis was performed following the standard protocol during bolus
administration of intravenous contrast.
Contrast:  100 ml Fmnipaque-8MM.
TECHNIQUE: Multidetector CT imaging of the thoracic spine was
performed without intravenous contrast administration. Multiplanar
CT image reconstructions were also generated.
TECHNIQUE: Multidetector CT imaging of the lumbar spine was
performed without intravenous contrast administration.  Multiplanar

[Series 2: cap 5.0 i31f 1 · axial · 0.66mm/px · z∈[+749,+1269]mm · 10 of 121 slices shown, 13 images]
[im 9/121  mediastinal]
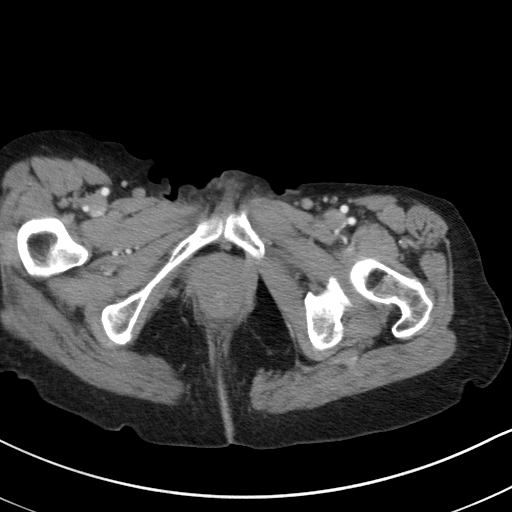
[im 9/121  lung]
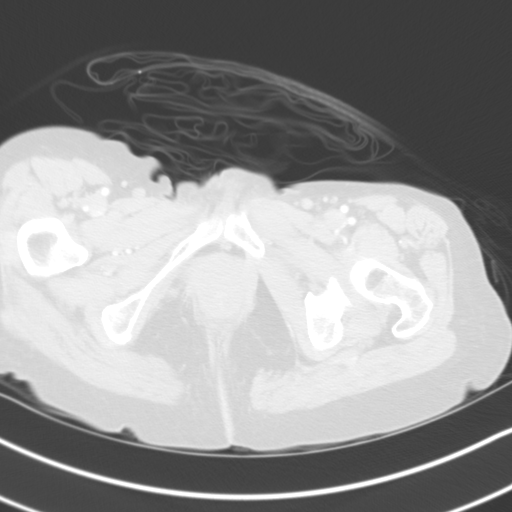
[im 25/121  lung]
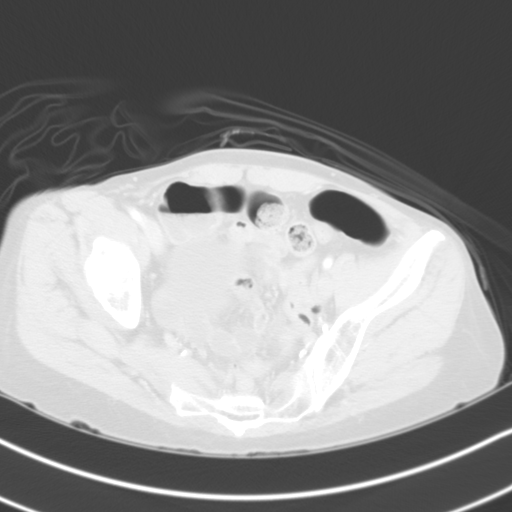
[im 33/121  lung]
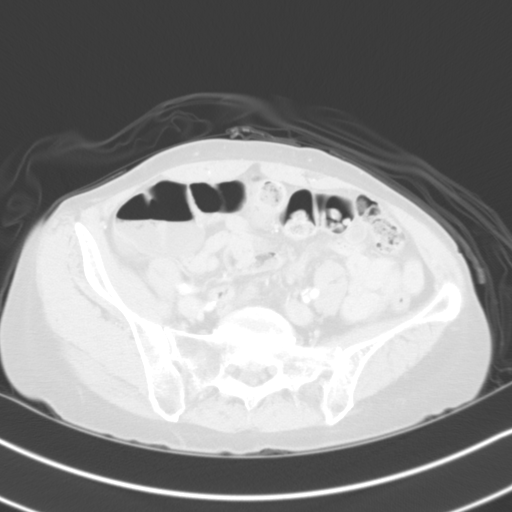
[im 41/121  lung]
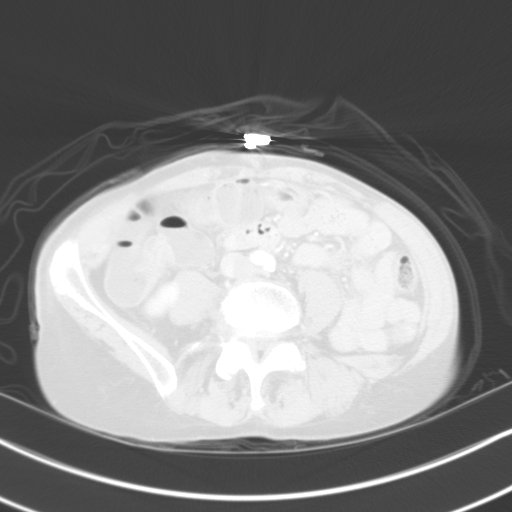
[im 57/121  mediastinal]
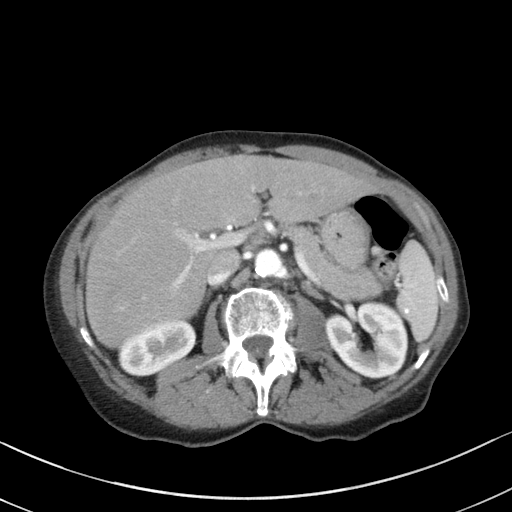
[im 57/121  lung]
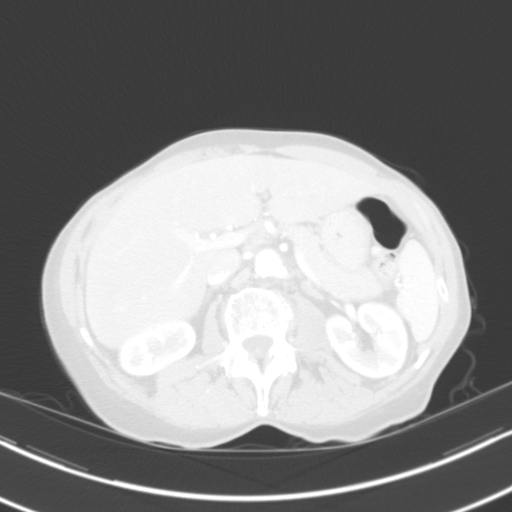
[im 65/121  lung]
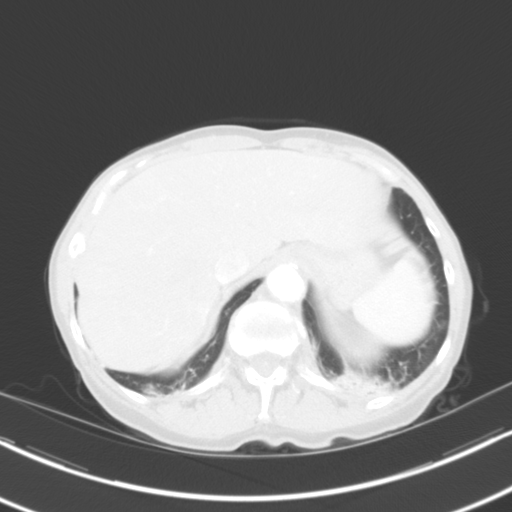
[im 81/121  lung]
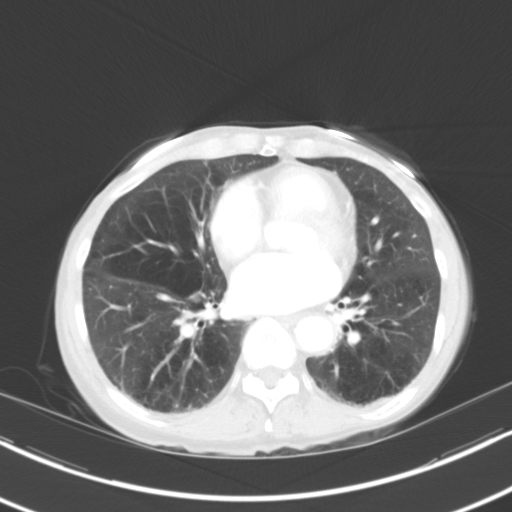
[im 89/121  lung]
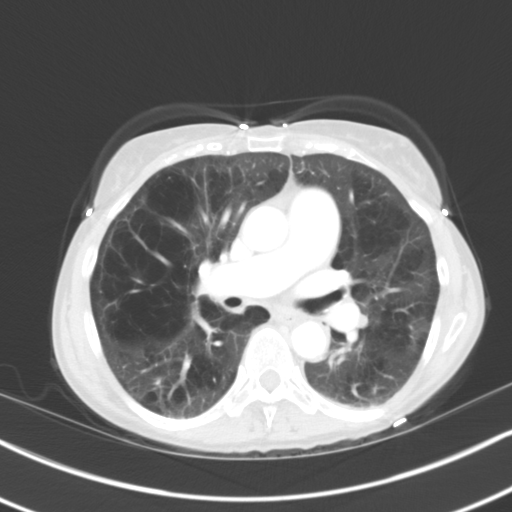
[im 97/121  mediastinal]
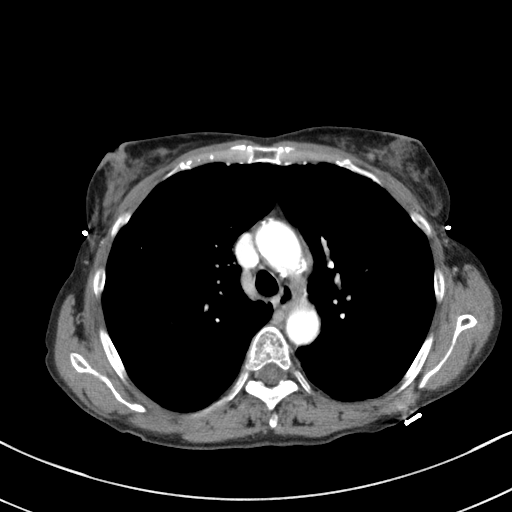
[im 97/121  lung]
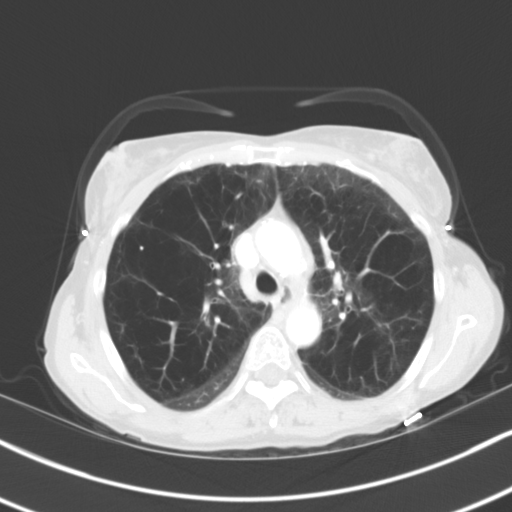
[im 113/121  lung]
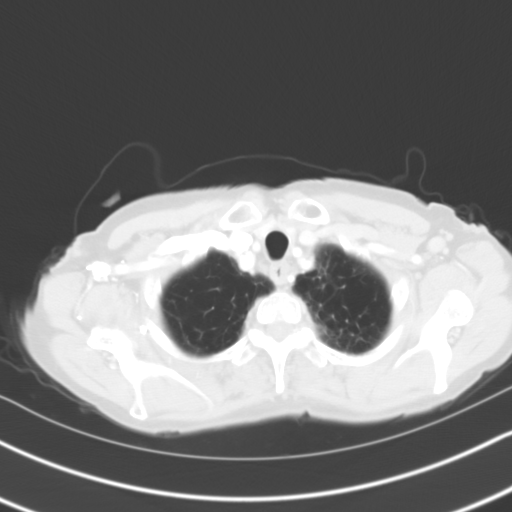

[Series 6: coronal · coronal · 1.18mm/px · 3 of 69 slices shown]
[im 14/69  lung]
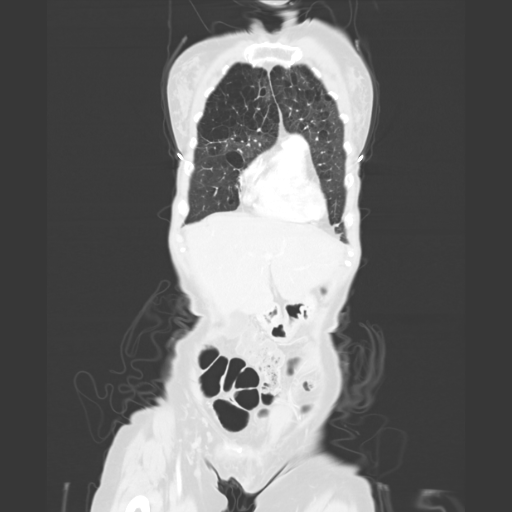
[im 28/69  lung]
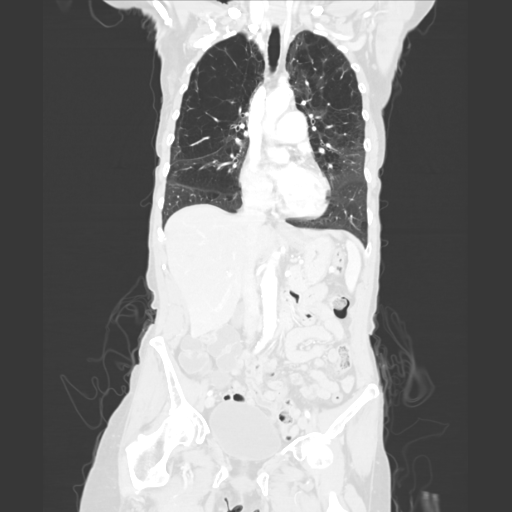
[im 41/69  lung]
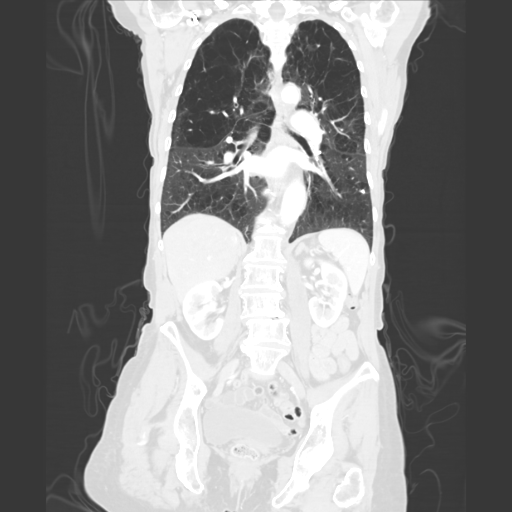

[13 of 36 positions shown; findings below may reference images not displayed]

FINDINGS: [Severe upper lobe predominant centrilobular emphysema.
No pneumothorax. Mild dependent pulmonary opacity in both lungs
most resembling atelectasis.  No definite pleural effusion.

Osteopenia.  No rib fracture identified.  Sternum intact.  Thoracic
spine is reported below.

Negative thoracic inlet.  No mediastinal hematoma.  No pericardial
effusion.  Thoracic aorta appears intact with atherosclerosis.
Mild enlargement of the central pulmonary arteries.  No mediastinal
lymphadenopathy.  No axillary lymphadenopathy.]
IMPRESSION: [1.  See thoracic spine injury described in the dedicated spine
report below.

2.  No other acute traumatic injury identified in the chest.
Emphysema and mild atelectasis.

3.  Abdomen, pelvis, and spine findings are below.]

CT ABDOMEN AND PELVIS
FINDINGS: [Lumbar spine findings are reported below.

Osteopenia.  Sacrum and SI joints intact.  No acute pelvis fracture
identified.  Proximal femurs intact.

No pelvic free fluid.  Nondilated large and small bowel loops in
the pelvis.  Mildly distended bladder.  Uterus and adnexa appear to
be surgically absent.

No dilated bowel loops.  Redundant colon.  Stool and fluid in the
proximal colon.  Decompressed stomach and duodenum.

No liver injury identified.  Mildly decreased liver density
throughout suggesting a degree of steatosis, and small sub
centimeter left lobe hypodense areas which probably are benign
cysts or biliary hamartomas.

Intact spleen with occasional calcified granulomas.  Negative
pancreas, adrenal glands, and gallbladder.  Portal venous system
appears grossly normal.  Widespread atherosclerosis of the aorta
and its branches.  Major arterial structures in the abdomen and
pelvis are patent.  No abdominal free fluid.  No lymphadenopathy
identified.]
IMPRESSION: [1. No acute traumatic injury identified [in the abdomen and
pelvis.

2.  See thoracic and lumbar spine findings below.] ]

CT THORACIC SPINE WITHOUT CONTRAST
FINDINGS: [Osteopenia.  Mild anterolisthesis of C7 on T1 appears
to be degenerative and is associated with bilateral facet
hypertrophy.

Similar mild anterolisthesis of T2-9 T3 appears to be degenerative
and associated with facet hypertrophy.

Severe T12 vertebral body fracture, severely comminuted with
retropulsion of the posterior-superior endplate up to 6 mm.  This
mildly narrows the AP spinal canal dimension 210-11 mm.  The
pedicles appear intact along with the remaining T12 posterior
elements.  Minimal to mild paraspinal edema/stranding.  No CT
evidence of epidural hemorrhage.

Mild T7 superior endplate deformity appears to be chronic and
probably degenerative.. Other thoracic vertebrae are intact.
Posterior ribs are intact.  The L1 vertebrae is intact.  See
additional lumbar details below.]
IMPRESSION: [

1.  Severely comminuted T12 vertebral body fracture, with a severe
compression versus mild burst configuration.  Some retropulsion of
the posterior-superior endplate, but with relatively well
maintained patency of the spinal canal at this level (AP dimension
and 10 mm).  T12 posterior elements appear intact.

2.  Osteopenia.  No other acute thoracic spine fracture identified.
Mild degenerative anterolisthesis at C7-T1 and T2-T3.

3.  Lumbar spine findings are below.]

CT LUMBAR SPINE WITHOUT CONTRAST
FINDINGS: [Normal lumbar segmentation.  T12 fracture as detailed
above.  Lumbar vertebrae intact.  Lumbar vertebral height and
alignment normal except for trace anterolisthesis of L4 on L5, with
associated moderate to severe facet degeneration at that level.
Visible sacrum appears intact. Visualized paraspinal soft tissues
are within normal limits.

Advanced L3-L4 disc degeneration with vacuum disc phenomena.
Superimposed posterior element hypertrophy.  This level and the L3-
L4 level demonstrate multifactorial moderate to severe spinal
stenosis.]
IMPRESSION: [1.  No acute traumatic injury in the lumbar spine.

2.  Degenerative multifactorial L3-L4 and L4-L5 spinal stenosis.]

Study discussed by telephone with {Dr. Huntley} on {06/02/2013} at
{7775 hours}.

## 2015-07-24 DIAGNOSIS — F329 Major depressive disorder, single episode, unspecified: Secondary | ICD-10-CM | POA: Diagnosis not present

## 2015-07-24 DIAGNOSIS — F4323 Adjustment disorder with mixed anxiety and depressed mood: Secondary | ICD-10-CM | POA: Diagnosis not present

## 2015-07-31 DIAGNOSIS — F329 Major depressive disorder, single episode, unspecified: Secondary | ICD-10-CM | POA: Diagnosis not present

## 2015-07-31 DIAGNOSIS — F4323 Adjustment disorder with mixed anxiety and depressed mood: Secondary | ICD-10-CM | POA: Diagnosis not present

## 2015-08-07 DIAGNOSIS — F329 Major depressive disorder, single episode, unspecified: Secondary | ICD-10-CM | POA: Diagnosis not present

## 2015-08-07 DIAGNOSIS — F4323 Adjustment disorder with mixed anxiety and depressed mood: Secondary | ICD-10-CM | POA: Diagnosis not present

## 2015-08-14 DIAGNOSIS — F4323 Adjustment disorder with mixed anxiety and depressed mood: Secondary | ICD-10-CM | POA: Diagnosis not present

## 2015-08-14 DIAGNOSIS — F329 Major depressive disorder, single episode, unspecified: Secondary | ICD-10-CM | POA: Diagnosis not present

## 2015-08-15 DIAGNOSIS — Z23 Encounter for immunization: Secondary | ICD-10-CM | POA: Diagnosis not present

## 2015-08-21 DIAGNOSIS — F329 Major depressive disorder, single episode, unspecified: Secondary | ICD-10-CM | POA: Diagnosis not present

## 2015-08-21 DIAGNOSIS — F4323 Adjustment disorder with mixed anxiety and depressed mood: Secondary | ICD-10-CM | POA: Diagnosis not present

## 2015-09-04 DIAGNOSIS — F4323 Adjustment disorder with mixed anxiety and depressed mood: Secondary | ICD-10-CM | POA: Diagnosis not present

## 2015-09-04 DIAGNOSIS — F329 Major depressive disorder, single episode, unspecified: Secondary | ICD-10-CM | POA: Diagnosis not present

## 2015-09-17 DIAGNOSIS — E7800 Pure hypercholesterolemia, unspecified: Secondary | ICD-10-CM | POA: Diagnosis not present

## 2015-09-17 DIAGNOSIS — I10 Essential (primary) hypertension: Secondary | ICD-10-CM | POA: Diagnosis not present

## 2015-09-18 DIAGNOSIS — F4323 Adjustment disorder with mixed anxiety and depressed mood: Secondary | ICD-10-CM | POA: Diagnosis not present

## 2015-09-18 DIAGNOSIS — F329 Major depressive disorder, single episode, unspecified: Secondary | ICD-10-CM | POA: Diagnosis not present

## 2015-09-23 DIAGNOSIS — F4323 Adjustment disorder with mixed anxiety and depressed mood: Secondary | ICD-10-CM | POA: Diagnosis not present

## 2015-09-23 DIAGNOSIS — F329 Major depressive disorder, single episode, unspecified: Secondary | ICD-10-CM | POA: Diagnosis not present

## 2015-09-29 DIAGNOSIS — L820 Inflamed seborrheic keratosis: Secondary | ICD-10-CM | POA: Diagnosis not present

## 2015-09-29 DIAGNOSIS — L570 Actinic keratosis: Secondary | ICD-10-CM | POA: Diagnosis not present

## 2015-09-29 DIAGNOSIS — L308 Other specified dermatitis: Secondary | ICD-10-CM | POA: Diagnosis not present

## 2015-09-29 DIAGNOSIS — D225 Melanocytic nevi of trunk: Secondary | ICD-10-CM | POA: Diagnosis not present

## 2015-09-29 DIAGNOSIS — L821 Other seborrheic keratosis: Secondary | ICD-10-CM | POA: Diagnosis not present

## 2015-09-30 DIAGNOSIS — F329 Major depressive disorder, single episode, unspecified: Secondary | ICD-10-CM | POA: Diagnosis not present

## 2015-09-30 DIAGNOSIS — F4323 Adjustment disorder with mixed anxiety and depressed mood: Secondary | ICD-10-CM | POA: Diagnosis not present

## 2015-10-07 DIAGNOSIS — F4323 Adjustment disorder with mixed anxiety and depressed mood: Secondary | ICD-10-CM | POA: Diagnosis not present

## 2015-10-07 DIAGNOSIS — F329 Major depressive disorder, single episode, unspecified: Secondary | ICD-10-CM | POA: Diagnosis not present

## 2015-10-28 DIAGNOSIS — F329 Major depressive disorder, single episode, unspecified: Secondary | ICD-10-CM | POA: Diagnosis not present

## 2015-10-28 DIAGNOSIS — F4323 Adjustment disorder with mixed anxiety and depressed mood: Secondary | ICD-10-CM | POA: Diagnosis not present

## 2015-11-04 DIAGNOSIS — F329 Major depressive disorder, single episode, unspecified: Secondary | ICD-10-CM | POA: Diagnosis not present

## 2015-11-04 DIAGNOSIS — F4323 Adjustment disorder with mixed anxiety and depressed mood: Secondary | ICD-10-CM | POA: Diagnosis not present

## 2015-11-11 DIAGNOSIS — F329 Major depressive disorder, single episode, unspecified: Secondary | ICD-10-CM | POA: Diagnosis not present

## 2015-11-11 DIAGNOSIS — F4323 Adjustment disorder with mixed anxiety and depressed mood: Secondary | ICD-10-CM | POA: Diagnosis not present

## 2015-11-25 DIAGNOSIS — F329 Major depressive disorder, single episode, unspecified: Secondary | ICD-10-CM | POA: Diagnosis not present

## 2015-11-25 DIAGNOSIS — F4323 Adjustment disorder with mixed anxiety and depressed mood: Secondary | ICD-10-CM | POA: Diagnosis not present

## 2015-12-09 DIAGNOSIS — F4323 Adjustment disorder with mixed anxiety and depressed mood: Secondary | ICD-10-CM | POA: Diagnosis not present

## 2015-12-09 DIAGNOSIS — F329 Major depressive disorder, single episode, unspecified: Secondary | ICD-10-CM | POA: Diagnosis not present

## 2015-12-16 DIAGNOSIS — F4323 Adjustment disorder with mixed anxiety and depressed mood: Secondary | ICD-10-CM | POA: Diagnosis not present

## 2015-12-16 DIAGNOSIS — F329 Major depressive disorder, single episode, unspecified: Secondary | ICD-10-CM | POA: Diagnosis not present

## 2015-12-23 DIAGNOSIS — F4323 Adjustment disorder with mixed anxiety and depressed mood: Secondary | ICD-10-CM | POA: Diagnosis not present

## 2015-12-23 DIAGNOSIS — F329 Major depressive disorder, single episode, unspecified: Secondary | ICD-10-CM | POA: Diagnosis not present

## 2015-12-31 DIAGNOSIS — F4323 Adjustment disorder with mixed anxiety and depressed mood: Secondary | ICD-10-CM | POA: Diagnosis not present

## 2015-12-31 DIAGNOSIS — F329 Major depressive disorder, single episode, unspecified: Secondary | ICD-10-CM | POA: Diagnosis not present

## 2016-01-06 DIAGNOSIS — F329 Major depressive disorder, single episode, unspecified: Secondary | ICD-10-CM | POA: Diagnosis not present

## 2016-01-06 DIAGNOSIS — F4323 Adjustment disorder with mixed anxiety and depressed mood: Secondary | ICD-10-CM | POA: Diagnosis not present

## 2016-01-13 DIAGNOSIS — F329 Major depressive disorder, single episode, unspecified: Secondary | ICD-10-CM | POA: Diagnosis not present

## 2016-01-13 DIAGNOSIS — F4323 Adjustment disorder with mixed anxiety and depressed mood: Secondary | ICD-10-CM | POA: Diagnosis not present

## 2016-01-20 DIAGNOSIS — F4323 Adjustment disorder with mixed anxiety and depressed mood: Secondary | ICD-10-CM | POA: Diagnosis not present

## 2016-01-20 DIAGNOSIS — F329 Major depressive disorder, single episode, unspecified: Secondary | ICD-10-CM | POA: Diagnosis not present

## 2016-02-03 DIAGNOSIS — F329 Major depressive disorder, single episode, unspecified: Secondary | ICD-10-CM | POA: Diagnosis not present

## 2016-02-03 DIAGNOSIS — F4323 Adjustment disorder with mixed anxiety and depressed mood: Secondary | ICD-10-CM | POA: Diagnosis not present

## 2016-02-10 DIAGNOSIS — F329 Major depressive disorder, single episode, unspecified: Secondary | ICD-10-CM | POA: Diagnosis not present

## 2016-02-10 DIAGNOSIS — F4323 Adjustment disorder with mixed anxiety and depressed mood: Secondary | ICD-10-CM | POA: Diagnosis not present

## 2016-02-24 DIAGNOSIS — F4323 Adjustment disorder with mixed anxiety and depressed mood: Secondary | ICD-10-CM | POA: Diagnosis not present

## 2016-02-24 DIAGNOSIS — F329 Major depressive disorder, single episode, unspecified: Secondary | ICD-10-CM | POA: Diagnosis not present

## 2016-03-02 DIAGNOSIS — F329 Major depressive disorder, single episode, unspecified: Secondary | ICD-10-CM | POA: Diagnosis not present

## 2016-03-02 DIAGNOSIS — F4323 Adjustment disorder with mixed anxiety and depressed mood: Secondary | ICD-10-CM | POA: Diagnosis not present

## 2016-03-16 DIAGNOSIS — F4323 Adjustment disorder with mixed anxiety and depressed mood: Secondary | ICD-10-CM | POA: Diagnosis not present

## 2016-03-16 DIAGNOSIS — F329 Major depressive disorder, single episode, unspecified: Secondary | ICD-10-CM | POA: Diagnosis not present

## 2016-04-06 DIAGNOSIS — F329 Major depressive disorder, single episode, unspecified: Secondary | ICD-10-CM | POA: Diagnosis not present

## 2016-04-06 DIAGNOSIS — F4323 Adjustment disorder with mixed anxiety and depressed mood: Secondary | ICD-10-CM | POA: Diagnosis not present

## 2016-04-15 DIAGNOSIS — F329 Major depressive disorder, single episode, unspecified: Secondary | ICD-10-CM | POA: Diagnosis not present

## 2016-04-15 DIAGNOSIS — F4323 Adjustment disorder with mixed anxiety and depressed mood: Secondary | ICD-10-CM | POA: Diagnosis not present

## 2016-04-19 DIAGNOSIS — Z23 Encounter for immunization: Secondary | ICD-10-CM | POA: Diagnosis not present

## 2016-04-19 DIAGNOSIS — F329 Major depressive disorder, single episode, unspecified: Secondary | ICD-10-CM | POA: Diagnosis not present

## 2016-04-19 DIAGNOSIS — F4323 Adjustment disorder with mixed anxiety and depressed mood: Secondary | ICD-10-CM | POA: Diagnosis not present

## 2016-04-27 DIAGNOSIS — F329 Major depressive disorder, single episode, unspecified: Secondary | ICD-10-CM | POA: Diagnosis not present

## 2016-04-27 DIAGNOSIS — F4323 Adjustment disorder with mixed anxiety and depressed mood: Secondary | ICD-10-CM | POA: Diagnosis not present

## 2016-05-04 DIAGNOSIS — F4323 Adjustment disorder with mixed anxiety and depressed mood: Secondary | ICD-10-CM | POA: Diagnosis not present

## 2016-05-04 DIAGNOSIS — F329 Major depressive disorder, single episode, unspecified: Secondary | ICD-10-CM | POA: Diagnosis not present

## 2016-05-11 DIAGNOSIS — F4323 Adjustment disorder with mixed anxiety and depressed mood: Secondary | ICD-10-CM | POA: Diagnosis not present

## 2016-05-11 DIAGNOSIS — F329 Major depressive disorder, single episode, unspecified: Secondary | ICD-10-CM | POA: Diagnosis not present

## 2016-05-18 DIAGNOSIS — F4323 Adjustment disorder with mixed anxiety and depressed mood: Secondary | ICD-10-CM | POA: Diagnosis not present

## 2016-05-18 DIAGNOSIS — F329 Major depressive disorder, single episode, unspecified: Secondary | ICD-10-CM | POA: Diagnosis not present

## 2016-05-25 DIAGNOSIS — F329 Major depressive disorder, single episode, unspecified: Secondary | ICD-10-CM | POA: Diagnosis not present

## 2016-05-25 DIAGNOSIS — F4323 Adjustment disorder with mixed anxiety and depressed mood: Secondary | ICD-10-CM | POA: Diagnosis not present

## 2016-06-01 DIAGNOSIS — F329 Major depressive disorder, single episode, unspecified: Secondary | ICD-10-CM | POA: Diagnosis not present

## 2016-06-01 DIAGNOSIS — F4323 Adjustment disorder with mixed anxiety and depressed mood: Secondary | ICD-10-CM | POA: Diagnosis not present

## 2016-06-08 DIAGNOSIS — F4323 Adjustment disorder with mixed anxiety and depressed mood: Secondary | ICD-10-CM | POA: Diagnosis not present

## 2016-06-08 DIAGNOSIS — F329 Major depressive disorder, single episode, unspecified: Secondary | ICD-10-CM | POA: Diagnosis not present

## 2016-06-11 DIAGNOSIS — C44629 Squamous cell carcinoma of skin of left upper limb, including shoulder: Secondary | ICD-10-CM | POA: Diagnosis not present

## 2016-06-11 DIAGNOSIS — L821 Other seborrheic keratosis: Secondary | ICD-10-CM | POA: Diagnosis not present

## 2016-06-11 DIAGNOSIS — T07 Unspecified multiple injuries: Secondary | ICD-10-CM | POA: Diagnosis not present

## 2016-06-15 DIAGNOSIS — F329 Major depressive disorder, single episode, unspecified: Secondary | ICD-10-CM | POA: Diagnosis not present

## 2016-06-15 DIAGNOSIS — F4323 Adjustment disorder with mixed anxiety and depressed mood: Secondary | ICD-10-CM | POA: Diagnosis not present

## 2016-06-22 DIAGNOSIS — F329 Major depressive disorder, single episode, unspecified: Secondary | ICD-10-CM | POA: Diagnosis not present

## 2016-06-22 DIAGNOSIS — F4323 Adjustment disorder with mixed anxiety and depressed mood: Secondary | ICD-10-CM | POA: Diagnosis not present

## 2016-06-29 DIAGNOSIS — F4323 Adjustment disorder with mixed anxiety and depressed mood: Secondary | ICD-10-CM | POA: Diagnosis not present

## 2016-06-29 DIAGNOSIS — F329 Major depressive disorder, single episode, unspecified: Secondary | ICD-10-CM | POA: Diagnosis not present

## 2016-07-06 DIAGNOSIS — F4323 Adjustment disorder with mixed anxiety and depressed mood: Secondary | ICD-10-CM | POA: Diagnosis not present

## 2016-07-06 DIAGNOSIS — F329 Major depressive disorder, single episode, unspecified: Secondary | ICD-10-CM | POA: Diagnosis not present

## 2016-07-13 DIAGNOSIS — F4323 Adjustment disorder with mixed anxiety and depressed mood: Secondary | ICD-10-CM | POA: Diagnosis not present

## 2016-07-13 DIAGNOSIS — F329 Major depressive disorder, single episode, unspecified: Secondary | ICD-10-CM | POA: Diagnosis not present

## 2016-07-20 DIAGNOSIS — F329 Major depressive disorder, single episode, unspecified: Secondary | ICD-10-CM | POA: Diagnosis not present

## 2016-07-20 DIAGNOSIS — F4323 Adjustment disorder with mixed anxiety and depressed mood: Secondary | ICD-10-CM | POA: Diagnosis not present

## 2016-07-23 DIAGNOSIS — Z85828 Personal history of other malignant neoplasm of skin: Secondary | ICD-10-CM | POA: Diagnosis not present

## 2016-07-23 DIAGNOSIS — Z1231 Encounter for screening mammogram for malignant neoplasm of breast: Secondary | ICD-10-CM | POA: Diagnosis not present

## 2016-07-23 DIAGNOSIS — L820 Inflamed seborrheic keratosis: Secondary | ICD-10-CM | POA: Diagnosis not present

## 2016-07-23 DIAGNOSIS — M810 Age-related osteoporosis without current pathological fracture: Secondary | ICD-10-CM | POA: Diagnosis not present

## 2016-07-23 DIAGNOSIS — M8589 Other specified disorders of bone density and structure, multiple sites: Secondary | ICD-10-CM | POA: Diagnosis not present

## 2016-07-23 DIAGNOSIS — Z803 Family history of malignant neoplasm of breast: Secondary | ICD-10-CM | POA: Diagnosis not present

## 2016-07-27 DIAGNOSIS — F4323 Adjustment disorder with mixed anxiety and depressed mood: Secondary | ICD-10-CM | POA: Diagnosis not present

## 2016-07-27 DIAGNOSIS — F329 Major depressive disorder, single episode, unspecified: Secondary | ICD-10-CM | POA: Diagnosis not present

## 2016-07-29 DIAGNOSIS — R922 Inconclusive mammogram: Secondary | ICD-10-CM | POA: Diagnosis not present

## 2016-07-29 DIAGNOSIS — R928 Other abnormal and inconclusive findings on diagnostic imaging of breast: Secondary | ICD-10-CM | POA: Diagnosis not present

## 2016-08-10 DIAGNOSIS — F4323 Adjustment disorder with mixed anxiety and depressed mood: Secondary | ICD-10-CM | POA: Diagnosis not present

## 2016-08-10 DIAGNOSIS — F329 Major depressive disorder, single episode, unspecified: Secondary | ICD-10-CM | POA: Diagnosis not present

## 2016-08-17 DIAGNOSIS — F4323 Adjustment disorder with mixed anxiety and depressed mood: Secondary | ICD-10-CM | POA: Diagnosis not present

## 2016-08-17 DIAGNOSIS — F329 Major depressive disorder, single episode, unspecified: Secondary | ICD-10-CM | POA: Diagnosis not present

## 2016-08-24 DIAGNOSIS — F329 Major depressive disorder, single episode, unspecified: Secondary | ICD-10-CM | POA: Diagnosis not present

## 2016-08-24 DIAGNOSIS — F4323 Adjustment disorder with mixed anxiety and depressed mood: Secondary | ICD-10-CM | POA: Diagnosis not present

## 2016-09-07 DIAGNOSIS — F4323 Adjustment disorder with mixed anxiety and depressed mood: Secondary | ICD-10-CM | POA: Diagnosis not present

## 2016-09-07 DIAGNOSIS — F329 Major depressive disorder, single episode, unspecified: Secondary | ICD-10-CM | POA: Diagnosis not present

## 2016-09-21 DIAGNOSIS — F329 Major depressive disorder, single episode, unspecified: Secondary | ICD-10-CM | POA: Diagnosis not present

## 2016-09-21 DIAGNOSIS — F4323 Adjustment disorder with mixed anxiety and depressed mood: Secondary | ICD-10-CM | POA: Diagnosis not present

## 2016-10-06 DIAGNOSIS — F329 Major depressive disorder, single episode, unspecified: Secondary | ICD-10-CM | POA: Diagnosis not present

## 2016-10-06 DIAGNOSIS — F4323 Adjustment disorder with mixed anxiety and depressed mood: Secondary | ICD-10-CM | POA: Diagnosis not present

## 2016-10-13 DIAGNOSIS — F4323 Adjustment disorder with mixed anxiety and depressed mood: Secondary | ICD-10-CM | POA: Diagnosis not present

## 2016-10-13 DIAGNOSIS — F329 Major depressive disorder, single episode, unspecified: Secondary | ICD-10-CM | POA: Diagnosis not present

## 2016-10-19 DIAGNOSIS — F4323 Adjustment disorder with mixed anxiety and depressed mood: Secondary | ICD-10-CM | POA: Diagnosis not present

## 2016-10-19 DIAGNOSIS — F329 Major depressive disorder, single episode, unspecified: Secondary | ICD-10-CM | POA: Diagnosis not present

## 2016-10-21 DIAGNOSIS — H2513 Age-related nuclear cataract, bilateral: Secondary | ICD-10-CM | POA: Diagnosis not present

## 2016-10-21 DIAGNOSIS — H524 Presbyopia: Secondary | ICD-10-CM | POA: Diagnosis not present

## 2016-10-21 DIAGNOSIS — H52203 Unspecified astigmatism, bilateral: Secondary | ICD-10-CM | POA: Diagnosis not present

## 2016-10-21 DIAGNOSIS — H5203 Hypermetropia, bilateral: Secondary | ICD-10-CM | POA: Diagnosis not present

## 2016-10-21 DIAGNOSIS — H25013 Cortical age-related cataract, bilateral: Secondary | ICD-10-CM | POA: Diagnosis not present

## 2016-11-02 DIAGNOSIS — F4323 Adjustment disorder with mixed anxiety and depressed mood: Secondary | ICD-10-CM | POA: Diagnosis not present

## 2016-11-02 DIAGNOSIS — F329 Major depressive disorder, single episode, unspecified: Secondary | ICD-10-CM | POA: Diagnosis not present

## 2016-11-09 DIAGNOSIS — F329 Major depressive disorder, single episode, unspecified: Secondary | ICD-10-CM | POA: Diagnosis not present

## 2016-11-09 DIAGNOSIS — F4323 Adjustment disorder with mixed anxiety and depressed mood: Secondary | ICD-10-CM | POA: Diagnosis not present

## 2016-11-16 DIAGNOSIS — F4323 Adjustment disorder with mixed anxiety and depressed mood: Secondary | ICD-10-CM | POA: Diagnosis not present

## 2016-11-16 DIAGNOSIS — F329 Major depressive disorder, single episode, unspecified: Secondary | ICD-10-CM | POA: Diagnosis not present

## 2016-12-13 DIAGNOSIS — Z86010 Personal history of colonic polyps: Secondary | ICD-10-CM | POA: Diagnosis not present

## 2016-12-13 DIAGNOSIS — I10 Essential (primary) hypertension: Secondary | ICD-10-CM | POA: Diagnosis not present

## 2016-12-13 DIAGNOSIS — E785 Hyperlipidemia, unspecified: Secondary | ICD-10-CM | POA: Diagnosis not present

## 2016-12-15 DIAGNOSIS — H2513 Age-related nuclear cataract, bilateral: Secondary | ICD-10-CM | POA: Diagnosis not present

## 2016-12-21 DIAGNOSIS — F329 Major depressive disorder, single episode, unspecified: Secondary | ICD-10-CM | POA: Diagnosis not present

## 2016-12-21 DIAGNOSIS — F4323 Adjustment disorder with mixed anxiety and depressed mood: Secondary | ICD-10-CM | POA: Diagnosis not present

## 2016-12-23 DIAGNOSIS — K635 Polyp of colon: Secondary | ICD-10-CM | POA: Diagnosis not present

## 2016-12-23 DIAGNOSIS — D124 Benign neoplasm of descending colon: Secondary | ICD-10-CM | POA: Diagnosis not present

## 2016-12-23 DIAGNOSIS — Z86010 Personal history of colonic polyps: Secondary | ICD-10-CM | POA: Diagnosis not present

## 2016-12-23 DIAGNOSIS — K5730 Diverticulosis of large intestine without perforation or abscess without bleeding: Secondary | ICD-10-CM | POA: Diagnosis not present

## 2016-12-23 DIAGNOSIS — Z1211 Encounter for screening for malignant neoplasm of colon: Secondary | ICD-10-CM | POA: Diagnosis not present

## 2016-12-28 DIAGNOSIS — F329 Major depressive disorder, single episode, unspecified: Secondary | ICD-10-CM | POA: Diagnosis not present

## 2016-12-28 DIAGNOSIS — F4323 Adjustment disorder with mixed anxiety and depressed mood: Secondary | ICD-10-CM | POA: Diagnosis not present

## 2016-12-29 DIAGNOSIS — H2511 Age-related nuclear cataract, right eye: Secondary | ICD-10-CM | POA: Diagnosis not present

## 2016-12-29 DIAGNOSIS — H2512 Age-related nuclear cataract, left eye: Secondary | ICD-10-CM | POA: Diagnosis not present

## 2017-01-04 DIAGNOSIS — F4323 Adjustment disorder with mixed anxiety and depressed mood: Secondary | ICD-10-CM | POA: Diagnosis not present

## 2017-01-04 DIAGNOSIS — F329 Major depressive disorder, single episode, unspecified: Secondary | ICD-10-CM | POA: Diagnosis not present

## 2017-01-05 DIAGNOSIS — H25012 Cortical age-related cataract, left eye: Secondary | ICD-10-CM | POA: Diagnosis not present

## 2017-01-05 DIAGNOSIS — H2512 Age-related nuclear cataract, left eye: Secondary | ICD-10-CM | POA: Diagnosis not present

## 2017-01-18 DIAGNOSIS — F4323 Adjustment disorder with mixed anxiety and depressed mood: Secondary | ICD-10-CM | POA: Diagnosis not present

## 2017-01-18 DIAGNOSIS — F329 Major depressive disorder, single episode, unspecified: Secondary | ICD-10-CM | POA: Diagnosis not present

## 2017-01-25 DIAGNOSIS — F329 Major depressive disorder, single episode, unspecified: Secondary | ICD-10-CM | POA: Diagnosis not present

## 2017-01-25 DIAGNOSIS — F4323 Adjustment disorder with mixed anxiety and depressed mood: Secondary | ICD-10-CM | POA: Diagnosis not present

## 2017-02-01 DIAGNOSIS — F4323 Adjustment disorder with mixed anxiety and depressed mood: Secondary | ICD-10-CM | POA: Diagnosis not present

## 2017-02-01 DIAGNOSIS — F329 Major depressive disorder, single episode, unspecified: Secondary | ICD-10-CM | POA: Diagnosis not present

## 2017-02-15 DIAGNOSIS — F329 Major depressive disorder, single episode, unspecified: Secondary | ICD-10-CM | POA: Diagnosis not present

## 2017-02-15 DIAGNOSIS — F4323 Adjustment disorder with mixed anxiety and depressed mood: Secondary | ICD-10-CM | POA: Diagnosis not present

## 2017-02-22 DIAGNOSIS — F329 Major depressive disorder, single episode, unspecified: Secondary | ICD-10-CM | POA: Diagnosis not present

## 2017-02-22 DIAGNOSIS — F4323 Adjustment disorder with mixed anxiety and depressed mood: Secondary | ICD-10-CM | POA: Diagnosis not present

## 2017-03-01 DIAGNOSIS — F4323 Adjustment disorder with mixed anxiety and depressed mood: Secondary | ICD-10-CM | POA: Diagnosis not present

## 2017-03-01 DIAGNOSIS — F329 Major depressive disorder, single episode, unspecified: Secondary | ICD-10-CM | POA: Diagnosis not present

## 2017-03-08 DIAGNOSIS — F4323 Adjustment disorder with mixed anxiety and depressed mood: Secondary | ICD-10-CM | POA: Diagnosis not present

## 2017-03-08 DIAGNOSIS — F329 Major depressive disorder, single episode, unspecified: Secondary | ICD-10-CM | POA: Diagnosis not present

## 2017-03-15 DIAGNOSIS — F4323 Adjustment disorder with mixed anxiety and depressed mood: Secondary | ICD-10-CM | POA: Diagnosis not present

## 2017-03-15 DIAGNOSIS — F329 Major depressive disorder, single episode, unspecified: Secondary | ICD-10-CM | POA: Diagnosis not present

## 2017-03-21 DIAGNOSIS — R0600 Dyspnea, unspecified: Secondary | ICD-10-CM | POA: Diagnosis not present

## 2017-03-21 DIAGNOSIS — R0609 Other forms of dyspnea: Secondary | ICD-10-CM | POA: Diagnosis not present

## 2017-03-21 DIAGNOSIS — F419 Anxiety disorder, unspecified: Secondary | ICD-10-CM | POA: Diagnosis not present

## 2017-03-21 DIAGNOSIS — I10 Essential (primary) hypertension: Secondary | ICD-10-CM | POA: Diagnosis not present

## 2017-03-21 DIAGNOSIS — E7800 Pure hypercholesterolemia, unspecified: Secondary | ICD-10-CM | POA: Diagnosis not present

## 2017-03-22 DIAGNOSIS — F329 Major depressive disorder, single episode, unspecified: Secondary | ICD-10-CM | POA: Diagnosis not present

## 2017-03-22 DIAGNOSIS — F4323 Adjustment disorder with mixed anxiety and depressed mood: Secondary | ICD-10-CM | POA: Diagnosis not present

## 2017-03-29 DIAGNOSIS — F329 Major depressive disorder, single episode, unspecified: Secondary | ICD-10-CM | POA: Diagnosis not present

## 2017-03-29 DIAGNOSIS — F4323 Adjustment disorder with mixed anxiety and depressed mood: Secondary | ICD-10-CM | POA: Diagnosis not present

## 2017-04-05 DIAGNOSIS — F4323 Adjustment disorder with mixed anxiety and depressed mood: Secondary | ICD-10-CM | POA: Diagnosis not present

## 2017-04-05 DIAGNOSIS — F329 Major depressive disorder, single episode, unspecified: Secondary | ICD-10-CM | POA: Diagnosis not present

## 2017-04-12 DIAGNOSIS — F4323 Adjustment disorder with mixed anxiety and depressed mood: Secondary | ICD-10-CM | POA: Diagnosis not present

## 2017-04-12 DIAGNOSIS — F419 Anxiety disorder, unspecified: Secondary | ICD-10-CM | POA: Diagnosis not present

## 2017-04-12 DIAGNOSIS — F329 Major depressive disorder, single episode, unspecified: Secondary | ICD-10-CM | POA: Diagnosis not present

## 2017-04-12 DIAGNOSIS — I10 Essential (primary) hypertension: Secondary | ICD-10-CM | POA: Diagnosis not present

## 2017-04-12 DIAGNOSIS — E559 Vitamin D deficiency, unspecified: Secondary | ICD-10-CM | POA: Diagnosis not present

## 2017-04-18 DIAGNOSIS — E7800 Pure hypercholesterolemia, unspecified: Secondary | ICD-10-CM | POA: Diagnosis not present

## 2017-04-18 DIAGNOSIS — E559 Vitamin D deficiency, unspecified: Secondary | ICD-10-CM | POA: Diagnosis not present

## 2017-04-18 DIAGNOSIS — J449 Chronic obstructive pulmonary disease, unspecified: Secondary | ICD-10-CM | POA: Diagnosis not present

## 2017-04-18 DIAGNOSIS — I10 Essential (primary) hypertension: Secondary | ICD-10-CM | POA: Diagnosis not present

## 2017-04-19 DIAGNOSIS — F329 Major depressive disorder, single episode, unspecified: Secondary | ICD-10-CM | POA: Diagnosis not present

## 2017-04-19 DIAGNOSIS — F4323 Adjustment disorder with mixed anxiety and depressed mood: Secondary | ICD-10-CM | POA: Diagnosis not present

## 2017-04-26 DIAGNOSIS — F4323 Adjustment disorder with mixed anxiety and depressed mood: Secondary | ICD-10-CM | POA: Diagnosis not present

## 2017-04-26 DIAGNOSIS — F329 Major depressive disorder, single episode, unspecified: Secondary | ICD-10-CM | POA: Diagnosis not present

## 2017-05-04 ENCOUNTER — Emergency Department (HOSPITAL_COMMUNITY): Payer: Medicare Other

## 2017-05-04 ENCOUNTER — Encounter (HOSPITAL_COMMUNITY): Payer: Self-pay

## 2017-05-04 ENCOUNTER — Inpatient Hospital Stay (HOSPITAL_COMMUNITY)
Admission: EM | Admit: 2017-05-04 | Discharge: 2017-05-13 | DRG: 190 | Disposition: A | Discharge status: Home Health | Payer: Medicare Other | Attending: Family Medicine | Admitting: Family Medicine

## 2017-05-04 DIAGNOSIS — I2510 Atherosclerotic heart disease of native coronary artery without angina pectoris: Secondary | ICD-10-CM | POA: Diagnosis present

## 2017-05-04 DIAGNOSIS — R05 Cough: Secondary | ICD-10-CM | POA: Diagnosis not present

## 2017-05-04 DIAGNOSIS — Z6820 Body mass index (BMI) 20.0-20.9, adult: Secondary | ICD-10-CM

## 2017-05-04 DIAGNOSIS — J189 Pneumonia, unspecified organism: Secondary | ICD-10-CM

## 2017-05-04 DIAGNOSIS — R748 Abnormal levels of other serum enzymes: Secondary | ICD-10-CM | POA: Diagnosis not present

## 2017-05-04 DIAGNOSIS — J9601 Acute respiratory failure with hypoxia: Secondary | ICD-10-CM | POA: Diagnosis present

## 2017-05-04 DIAGNOSIS — J9621 Acute and chronic respiratory failure with hypoxia: Secondary | ICD-10-CM | POA: Diagnosis present

## 2017-05-04 DIAGNOSIS — J441 Chronic obstructive pulmonary disease with (acute) exacerbation: Secondary | ICD-10-CM | POA: Diagnosis not present

## 2017-05-04 DIAGNOSIS — R739 Hyperglycemia, unspecified: Secondary | ICD-10-CM | POA: Diagnosis not present

## 2017-05-04 DIAGNOSIS — E875 Hyperkalemia: Secondary | ICD-10-CM

## 2017-05-04 DIAGNOSIS — J9600 Acute respiratory failure, unspecified whether with hypoxia or hypercapnia: Secondary | ICD-10-CM | POA: Diagnosis not present

## 2017-05-04 DIAGNOSIS — I2723 Pulmonary hypertension due to lung diseases and hypoxia: Secondary | ICD-10-CM | POA: Diagnosis present

## 2017-05-04 DIAGNOSIS — J9622 Acute and chronic respiratory failure with hypercapnia: Secondary | ICD-10-CM | POA: Diagnosis present

## 2017-05-04 DIAGNOSIS — R0602 Shortness of breath: Secondary | ICD-10-CM

## 2017-05-04 DIAGNOSIS — F419 Anxiety disorder, unspecified: Secondary | ICD-10-CM | POA: Diagnosis present

## 2017-05-04 DIAGNOSIS — Z886 Allergy status to analgesic agent status: Secondary | ICD-10-CM

## 2017-05-04 DIAGNOSIS — J440 Chronic obstructive pulmonary disease with acute lower respiratory infection: Secondary | ICD-10-CM | POA: Diagnosis present

## 2017-05-04 DIAGNOSIS — Z9981 Dependence on supplemental oxygen: Secondary | ICD-10-CM

## 2017-05-04 DIAGNOSIS — I2729 Other secondary pulmonary hypertension: Secondary | ICD-10-CM | POA: Diagnosis not present

## 2017-05-04 DIAGNOSIS — J439 Emphysema, unspecified: Secondary | ICD-10-CM

## 2017-05-04 DIAGNOSIS — E876 Hypokalemia: Secondary | ICD-10-CM

## 2017-05-04 DIAGNOSIS — R000 Tachycardia, unspecified: Secondary | ICD-10-CM

## 2017-05-04 DIAGNOSIS — Z79899 Other long term (current) drug therapy: Secondary | ICD-10-CM

## 2017-05-04 DIAGNOSIS — F17210 Nicotine dependence, cigarettes, uncomplicated: Secondary | ICD-10-CM | POA: Diagnosis present

## 2017-05-04 DIAGNOSIS — I959 Hypotension, unspecified: Secondary | ICD-10-CM

## 2017-05-04 DIAGNOSIS — I272 Pulmonary hypertension, unspecified: Secondary | ICD-10-CM

## 2017-05-04 DIAGNOSIS — I340 Nonrheumatic mitral (valve) insufficiency: Secondary | ICD-10-CM | POA: Diagnosis not present

## 2017-05-04 DIAGNOSIS — Z8249 Family history of ischemic heart disease and other diseases of the circulatory system: Secondary | ICD-10-CM

## 2017-05-04 DIAGNOSIS — E871 Hypo-osmolality and hyponatremia: Secondary | ICD-10-CM | POA: Diagnosis present

## 2017-05-04 DIAGNOSIS — J9602 Acute respiratory failure with hypercapnia: Secondary | ICD-10-CM

## 2017-05-04 DIAGNOSIS — E872 Acidosis: Secondary | ICD-10-CM | POA: Diagnosis present

## 2017-05-04 DIAGNOSIS — T380X5A Adverse effect of glucocorticoids and synthetic analogues, initial encounter: Secondary | ICD-10-CM | POA: Diagnosis not present

## 2017-05-04 DIAGNOSIS — J449 Chronic obstructive pulmonary disease, unspecified: Secondary | ICD-10-CM | POA: Diagnosis present

## 2017-05-04 DIAGNOSIS — R0902 Hypoxemia: Secondary | ICD-10-CM | POA: Diagnosis not present

## 2017-05-04 DIAGNOSIS — E785 Hyperlipidemia, unspecified: Secondary | ICD-10-CM | POA: Diagnosis present

## 2017-05-04 DIAGNOSIS — I472 Ventricular tachycardia: Secondary | ICD-10-CM | POA: Diagnosis present

## 2017-05-04 DIAGNOSIS — I10 Essential (primary) hypertension: Secondary | ICD-10-CM | POA: Diagnosis present

## 2017-05-04 DIAGNOSIS — Z880 Allergy status to penicillin: Secondary | ICD-10-CM

## 2017-05-04 DIAGNOSIS — I214 Non-ST elevation (NSTEMI) myocardial infarction: Secondary | ICD-10-CM

## 2017-05-04 DIAGNOSIS — I248 Other forms of acute ischemic heart disease: Secondary | ICD-10-CM | POA: Diagnosis not present

## 2017-05-04 DIAGNOSIS — E440 Moderate protein-calorie malnutrition: Secondary | ICD-10-CM | POA: Diagnosis present

## 2017-05-04 DIAGNOSIS — R7989 Other specified abnormal findings of blood chemistry: Secondary | ICD-10-CM

## 2017-05-04 DIAGNOSIS — R0603 Acute respiratory distress: Secondary | ICD-10-CM

## 2017-05-04 DIAGNOSIS — R778 Other specified abnormalities of plasma proteins: Secondary | ICD-10-CM

## 2017-05-04 DIAGNOSIS — J181 Lobar pneumonia, unspecified organism: Secondary | ICD-10-CM | POA: Diagnosis not present

## 2017-05-04 LAB — I-STAT VENOUS BLOOD GAS, ED
Bicarbonate: 26.4 mmol/L (ref 20.0–28.0)
O2 Saturation: 84 %
TCO2: 28 mmol/L (ref 0–100)
pCO2, Ven: 46 mmHg (ref 44.0–60.0)
pH, Ven: 7.367 (ref 7.250–7.430)
pO2, Ven: 51 mmHg — ABNORMAL HIGH (ref 32.0–45.0)

## 2017-05-04 LAB — CBC WITH DIFFERENTIAL/PLATELET
Basophils Absolute: 0.1 10*3/uL (ref 0.0–0.1)
Basophils Relative: 1 %
Eosinophils Absolute: 0.1 10*3/uL (ref 0.0–0.7)
Eosinophils Relative: 1 %
HCT: 45 % (ref 36.0–46.0)
Hemoglobin: 14.6 g/dL (ref 12.0–15.0)
Lymphocytes Relative: 9 %
Lymphs Abs: 1.2 10*3/uL (ref 0.7–4.0)
MCH: 31.6 pg (ref 26.0–34.0)
MCHC: 32.4 g/dL (ref 30.0–36.0)
MCV: 97.4 fL (ref 78.0–100.0)
Monocytes Absolute: 0.6 10*3/uL (ref 0.1–1.0)
Monocytes Relative: 5 %
Neutro Abs: 10.5 10*3/uL — ABNORMAL HIGH (ref 1.7–7.7)
Neutrophils Relative %: 84 %
Platelets: 270 10*3/uL (ref 150–400)
RBC: 4.62 MIL/uL (ref 3.87–5.11)
RDW: 14.4 % (ref 11.5–15.5)
WBC: 12.4 10*3/uL — ABNORMAL HIGH (ref 4.0–10.5)

## 2017-05-04 LAB — BASIC METABOLIC PANEL
Anion gap: 8 (ref 5–15)
Anion gap: 12 (ref 5–15)
BUN: 8 mg/dL (ref 6–20)
BUN: 7 mg/dL (ref 6–20)
CO2: 22 mmol/L (ref 22–32)
CO2: 19 mmol/L — ABNORMAL LOW (ref 22–32)
Calcium: 8.3 mg/dL — ABNORMAL LOW (ref 8.9–10.3)
Calcium: 8.1 mg/dL — ABNORMAL LOW (ref 8.9–10.3)
Chloride: 104 mmol/L (ref 101–111)
Chloride: 105 mmol/L (ref 101–111)
Creatinine, Ser: 0.68 mg/dL (ref 0.44–1.00)
Creatinine, Ser: 0.83 mg/dL (ref 0.44–1.00)
GFR calc Af Amer: >60 mL/min (ref >60)
GFR calc Af Amer: >60 mL/min (ref >60)
GFR calc non Af Amer: >60 mL/min (ref >60)
GFR calc non Af Amer: >60 mL/min (ref >60)
Glucose, Bld: 109 mg/dL — ABNORMAL HIGH (ref 65–99)
Glucose, Bld: 213 mg/dL — ABNORMAL HIGH (ref 65–99)
Potassium: 6.1 mmol/L — ABNORMAL HIGH (ref 3.5–5.1)
Potassium: 3.3 mmol/L — ABNORMAL LOW (ref 3.5–5.1)
Sodium: 134 mmol/L — ABNORMAL LOW (ref 135–145)
Sodium: 136 mmol/L (ref 135–145)

## 2017-05-04 LAB — LIPID PANEL
Cholesterol: 118 mg/dL (ref 0–200)
HDL: 31 mg/dL — ABNORMAL LOW (ref >40)
LDL Cholesterol: 72 mg/dL (ref 0–99)
Total CHOL/HDL Ratio: 3.8 RATIO
Triglycerides: 76 mg/dL (ref <150)
VLDL: 15 mg/dL (ref 0–40)

## 2017-05-04 LAB — CBC
HCT: 41.9 % (ref 36.0–46.0)
Hemoglobin: 13.8 g/dL (ref 12.0–15.0)
MCH: 32.2 pg (ref 26.0–34.0)
MCHC: 32.9 g/dL (ref 30.0–36.0)
MCV: 97.9 fL (ref 78.0–100.0)
Platelets: 239 10*3/uL (ref 150–400)
RBC: 4.28 MIL/uL (ref 3.87–5.11)
RDW: 14.5 % (ref 11.5–15.5)
WBC: 10 10*3/uL (ref 4.0–10.5)

## 2017-05-04 LAB — I-STAT TROPONIN, ED: Troponin i, poc: 0.04 ng/mL (ref 0.00–0.08)

## 2017-05-04 LAB — TROPONIN I
Troponin I: 0.08 ng/mL (ref <0.03)
Troponin I: 0.07 ng/mL (ref <0.03)
Troponin I: 0.05 ng/mL (ref <0.03)

## 2017-05-04 LAB — MRSA PCR SCREENING: MRSA by PCR: NEGATIVE

## 2017-05-04 LAB — MAGNESIUM: Magnesium: 1.7 mg/dL (ref 1.7–2.4)

## 2017-05-04 LAB — TSH: TSH: 0.687 u[IU]/mL (ref 0.350–4.500)

## 2017-05-04 MED ORDER — ALBUTEROL (5 MG/ML) CONTINUOUS INHALATION SOLN
10.0000 mg/h | INHALATION_SOLUTION | Freq: Once | RESPIRATORY_TRACT | Status: AC
  Administered 2017-05-04: 10 mg/h via RESPIRATORY_TRACT
  Filled 2017-05-04: qty 20

## 2017-05-04 MED ORDER — LEVOFLOXACIN IN D5W 500 MG/100ML IV SOLN
500.0000 mg | Freq: Once | INTRAVENOUS | Status: AC
  Administered 2017-05-04: 500 mg via INTRAVENOUS
  Filled 2017-05-04: qty 100

## 2017-05-04 MED ORDER — IPRATROPIUM-ALBUTEROL 0.5-2.5 (3) MG/3ML IN SOLN
3.0000 mL | Freq: Once | RESPIRATORY_TRACT | Status: AC
  Administered 2017-05-04: 3 mL via RESPIRATORY_TRACT
  Filled 2017-05-04: qty 3

## 2017-05-04 MED ORDER — ACETAMINOPHEN 500 MG PO TABS
500.0000 mg | ORAL_TABLET | Freq: Once | ORAL | Status: AC
  Administered 2017-05-04: 500 mg via ORAL
  Filled 2017-05-04: qty 1

## 2017-05-04 MED ORDER — PRAVASTATIN SODIUM 20 MG PO TABS
20.0000 mg | ORAL_TABLET | Freq: Every day | ORAL | Status: DC
  Administered 2017-05-04 – 2017-05-12 (×7): 20 mg via ORAL
  Filled 2017-05-04 (×8): qty 1

## 2017-05-04 MED ORDER — ACETAMINOPHEN 325 MG PO TABS
650.0000 mg | ORAL_TABLET | Freq: Four times a day (QID) | ORAL | Status: DC | PRN
  Administered 2017-05-06: 650 mg via ORAL
  Filled 2017-05-04: qty 2

## 2017-05-04 MED ORDER — PREDNISONE 50 MG PO TABS: 50.0000 mg | ORAL_TABLET | Freq: Every day | ORAL | Status: DC

## 2017-05-04 MED ORDER — ENOXAPARIN SODIUM 40 MG/0.4ML ~~LOC~~ SOLN: 40.0000 mg | SUBCUTANEOUS | Status: DC

## 2017-05-04 MED ORDER — CLONAZEPAM 0.5 MG PO TABS
0.5000 mg | ORAL_TABLET | Freq: Every day | ORAL | Status: DC | PRN
  Administered 2017-05-05 – 2017-05-09 (×4): 0.5 mg via ORAL
  Filled 2017-05-04 (×4): qty 1

## 2017-05-04 MED ORDER — PAROXETINE HCL 20 MG PO TABS
20.0000 mg | ORAL_TABLET | Freq: Every day | ORAL | Status: DC
  Administered 2017-05-04 – 2017-05-13 (×10): 20 mg via ORAL
  Filled 2017-05-04 (×10): qty 1

## 2017-05-04 MED ORDER — LEVOFLOXACIN IN D5W 750 MG/150ML IV SOLN
750.0000 mg | INTRAVENOUS | Status: AC
  Administered 2017-05-06 – 2017-05-10 (×3): 750 mg via INTRAVENOUS
  Filled 2017-05-04 (×7): qty 150

## 2017-05-04 MED ORDER — LEVALBUTEROL HCL 0.63 MG/3ML IN NEBU
0.6300 mg | INHALATION_SOLUTION | Freq: Four times a day (QID) | RESPIRATORY_TRACT | Status: DC
Start: 2017-05-04 — End: 2017-05-05
  Administered 2017-05-04 – 2017-05-05 (×3): 0.63 mg via RESPIRATORY_TRACT
  Filled 2017-05-04 (×4): qty 3

## 2017-05-04 MED ORDER — ATENOLOL 50 MG PO TABS
50.0000 mg | ORAL_TABLET | Freq: Every morning | ORAL | Status: DC
  Administered 2017-05-04: 50 mg via ORAL
  Filled 2017-05-04 (×2): qty 1

## 2017-05-04 MED ORDER — IPRATROPIUM-ALBUTEROL 0.5-2.5 (3) MG/3ML IN SOLN: 3.0000 mL | RESPIRATORY_TRACT | Status: DC

## 2017-05-04 MED ORDER — NICOTINE 21 MG/24HR TD PT24
21.0000 mg | MEDICATED_PATCH | Freq: Every day | TRANSDERMAL | Status: DC
  Administered 2017-05-04 – 2017-05-06 (×3): 21 mg via TRANSDERMAL
  Filled 2017-05-04 (×3): qty 1

## 2017-05-04 MED ORDER — HEPARIN BOLUS VIA INFUSION
3000.0000 [IU] | Freq: Once | INTRAVENOUS | Status: AC
  Administered 2017-05-04: 3000 [IU] via INTRAVENOUS
  Filled 2017-05-04: qty 3000

## 2017-05-04 MED ORDER — ENOXAPARIN SODIUM 40 MG/0.4ML ~~LOC~~ SOLN
40.0000 mg | SUBCUTANEOUS | Status: DC
  Filled 2017-05-04: qty 0.4

## 2017-05-04 MED ORDER — BUPROPION HCL ER (SR) 150 MG PO TB12
150.0000 mg | ORAL_TABLET | Freq: Every day | ORAL | Status: DC
  Administered 2017-05-04 – 2017-05-13 (×10): 150 mg via ORAL
  Filled 2017-05-04 (×10): qty 1

## 2017-05-04 MED ORDER — ACETAMINOPHEN 650 MG RE SUPP: 650.0000 mg | Freq: Four times a day (QID) | RECTAL | Status: DC | PRN

## 2017-05-04 MED ORDER — LEVOFLOXACIN IN D5W 250 MG/50ML IV SOLN
250.0000 mg | Freq: Once | INTRAVENOUS | Status: AC
  Administered 2017-05-04: 250 mg via INTRAVENOUS
  Filled 2017-05-04: qty 50

## 2017-05-04 MED ORDER — UMECLIDINIUM-VILANTEROL 62.5-25 MCG/INH IN AEPB
1.0000 | INHALATION_SPRAY | Freq: Every day | RESPIRATORY_TRACT | Status: DC
  Filled 2017-05-04: qty 14

## 2017-05-04 MED ORDER — LEVOFLOXACIN IN D5W 750 MG/150ML IV SOLN: 750.0000 mg | INTRAVENOUS | Status: DC

## 2017-05-04 MED ORDER — HEPARIN (PORCINE) IN NACL 100-0.45 UNIT/ML-% IJ SOLN
850.0000 [IU]/h | INTRAMUSCULAR | Status: DC
  Administered 2017-05-04: 600 [IU]/h via INTRAVENOUS
  Administered 2017-05-05 – 2017-05-08 (×4): 850 [IU]/h via INTRAVENOUS
  Filled 2017-05-04 (×8): qty 250

## 2017-05-04 MED ORDER — POTASSIUM CHLORIDE CRYS ER 20 MEQ PO TBCR
40.0000 meq | EXTENDED_RELEASE_TABLET | Freq: Two times a day (BID) | ORAL | Status: AC
  Administered 2017-05-04 (×2): 40 meq via ORAL
  Filled 2017-05-04 (×2): qty 2

## 2017-05-04 MED ORDER — SODIUM CHLORIDE 0.9 % IV BOLUS (SEPSIS)
500.0000 mL | Freq: Once | INTRAVENOUS | Status: AC
  Administered 2017-05-04: 500 mL via INTRAVENOUS

## 2017-05-04 NOTE — Progress Notes (Signed)
Patient upon admission had wallet at bedside. RN attempted to send money to safe but patient refused. RN counted money with patient at bedside. Patient had 2 twenty dollar bills, 2 ten dollar bills, and 4 one dollar bills.

## 2017-05-04 NOTE — ED Notes (Signed)
Date and time results received: 05/04/17 12:26 PM   Test: Troponin Critical Value: 0.08  Name of Provider Notified: Tammi Klippel MD  Orders Received

## 2017-05-04 NOTE — H&P (Signed)
[facility] Admission History and Physical Service Pager: [phone]  Patient name: Kayla Mack Medical record number: [id number] Date of birth: 07/20/1946 Age: 71 y.o. Gender: female  Primary Care Provider: System, Provider Not In Consultants: None  Code Status: Full   Chief Complaint: SOB   Assessment and Plan: Kayla Mack is a 71 y.o. female presenting with SOB. PMH is significant for anxiety, COPD, depression, hyperlipidemia, hypertension, tobacco abuse disorder.  SOB: Recently diagnosed with COPD about 2 months ago, has a history of 75 pack years.Developed a cold on Saturday, with progressively worsening cough, congestion, sputum production, and dyspnea. Desatted to 78% on room air with EMS evaluation. Noted to have a slightly elevated WBC 12.4 and possibly infiltrate of right lung base on CXR. Noted to have elevated JVD with CXR positive for pulmonary HTN likely contributing. Hx of HTN, tobacco abuse, and HLD, places patient at increase risk for cardiac. EKG with Q-waves in lateral leads, and ST depression in v4-v6, troponin 0.04. Less concern for PE, wells score of 1.5 +/- for sinus tachycardia -given hx of onset associated with cough and congestion, no signs or symptoms of DVT, and venous ABG within normal limits. Mostly likely COPD/Pneumonia underlying diagnosis, EKG findings possibly demand ischemia as patient sinus tachy following breath treatment x 2 and CAT. Will however follow for ACS.  - Admit to step-down, attending Dr. Gwendlyn Deutscher  - Duonebs q4h, levalbuterol q6h prn  - Continuous pulse oximetry  - Repeat ABG   - Blood cultures and sputum culture  - Troponin and AM EKG  - Continue levaquin 750 mg for HCAP - received 500 mg in ED, will add 250 mg  - Received solumedrol 125 mg  via EMS, start prednisone 50 mg in the AM  - CXR concern for pulmonary HTN, elevated JVD - will obtain ECHO for pulmonary HTN  - AM CBC and BMET  Slightly  Hyperkalemia - Repeat BMET- recheck potassium, will likely be lower with albuterol treatments.   COPD  - Continue home Ellipta   Sinus Tachycardia: Breathing treatment x 3 +  Did not receive AM atenolol, Initial EKG without any ST elevation/depression, though Q-waves present. Repeat EKG with Q-waves in lateral leads, and ST depression in v4-v6, troponin 0.04. No hx of chest pain, diaphoresis, nausea/vomitting per patient - unlikely ACS. Mostly like sinus tachycardia is acting a stress test and patient likely has some blockages resulting in demand ischemia - Trend troponin  - AM EKG - Discussed with cardiology EKG, will see patient  - lipid panel, A1C, TSH  - Check electrolytes stat - K 6.1 initial, check mag    Tobacco Abuse Disorder  - start patch  - tobacco cessation teaching   HLD: Home lovastatin -Continue pravastatin  HTN: Normotensive  - Continue Atenolol    Anxiety:  -Continue bupropion -Continue home klonopin 0.5 mg prn   FEN/GI: NPO sips with meds Prophylaxis: Lovenox   Disposition: PT/OT  History of Present Illness:  Kayla Mack is a 71 y.o. female presenting with SOB. Patient noticed for several months ago that she had increasing SOB was found to have COPD by PCP. Patient was started on inhaler. Patient's mother developed off and congestion and was ultimately hospitalized for shortness of breath. She has been going back and forth to see her mother at the hospital. This weekend patient developed patient developed sore throat and congestion similar to her mother. Patient stayed in bed most of the day from noon. Patient  had severe cough, and when she started coughing she could not get breath of air in. She states this morning the cough was so bad that she felt like she was going to pass out. Decreased appetite over the last 24 hours. Patient called EMS and they gave patient a breathing treatment and placed oxygen. Patient desat on room air to 78% per EMS. Patient was  brought to the ED when she received another nebulizer treatment. Checked on patient again still being SOB and was placed on CAT. Patient has smoked from age 43s - about pack and half a day. Patient has been using a Ellipta over the past two months for her new diagnosis of COPD. No previous hx of needed oxgyen. Received 125 mg of solumedrol in the AM.   Review Of Systems: Per HPI with the following additions:  Review of Systems  Constitutional: Negative for chills and fever.  HENT: Positive for congestion and sore throat.   Respiratory: Negative for cough, sputum production, shortness of breath and wheezing.   Cardiovascular: Positive for palpitations. Negative for chest pain and leg swelling.  Gastrointestinal: Negative for abdominal pain, nausea and vomiting.  Genitourinary: Negative for dysuria and urgency.  Musculoskeletal: Negative for joint pain and myalgias.  Neurological: Positive for headaches. Negative for dizziness.    Patient Active Problem List   Diagnosis Date Noted  . Tobacco use disorder 06/04/2013  . T12 burst fracture ([place]) 06/03/2013  . Left radius fracture 06/03/2013  . Right calcaneal fracture 06/03/2013  . Fall from ladder 06/03/2013  . Osteopenia of the elderly 06/03/2013    Past Medical History: Past Medical History:  Diagnosis Date  . Anxiety   . COPD (chronic obstructive pulmonary disease) ([place])   . Depression   . Hyperlipidemia   . Hypertension     Past Surgical History: Past Surgical History:  Procedure Laterality Date  . APPENDECTOMY    . SHOULDER SURGERY Right Pin  . TONSILLECTOMY      Social History: Social History  Substance Use Topics  . Smoking status: Current Every Day Smoker    Packs/day: 1.50    Years: 40.00    Types: Cigarettes  . Smokeless tobacco: Never Used  . Alcohol use Yes     Comment: liquor    Additional social history: Please also refer to relevant sections of EMR.  Family History: History reviewed. No pertinent  family history. Father - COPD,  Mother - CHF, No hx of blood clots   Allergies and Medications: Allergies  Allergen Reactions  . Aleve [Naproxen Sodium] Hives  . Aspirin Other (See Comments)    Facial swelling  . Penicillins Hives   No current facility-administered medications on file prior to encounter.    Current Outpatient Prescriptions on File Prior to Encounter  Medication Sig Dispense Refill  . acetaminophen (TYLENOL) 500 MG tablet Take 1,000 mg by mouth 2 (two) times daily as needed for pain.     [name] atenolol (TENORMIN) 50 MG tablet Take 50 mg by mouth every morning.    . bisacodyl (DULCOLAX) 10 MG suppository Place 1 suppository (10 mg total) rectally daily as needed. 12 suppository 0  . calcium-vitamin D (OSCAL WITH D) 500-200 MG-UNIT per tablet Take 1 tablet by mouth 2 (two) times daily.    . clonazePAM (KLONOPIN) 0.5 MG tablet Take 1 tablet (0.5 mg total) by mouth 2 (two) times daily as needed for anxiety. 10 tablet 0  . docusate sodium 100 MG CAPS Take 100 mg by mouth  2 (two) times daily. 10 capsule 0  . feeding supplement (ENSURE) PUDG Take 1 Container by mouth 2 (two) times daily between meals.  0  . HYDROcodone-acetaminophen (NORCO/VICODIN) 5-325 MG per tablet Take 0.5-2 tablets by mouth every 4 (four) hours as needed. 10 tablet 0  . lovastatin (MEVACOR) 40 MG tablet Take 40 mg by mouth every morning.    . methocarbamol (ROBAXIN) 750 MG tablet Take 1 tablet (750 mg total) by mouth every 6 (six) hours as needed.    . pantoprazole (PROTONIX) 40 MG tablet Take 1 tablet (40 mg total) by mouth daily.    . polyethylene glycol (MIRALAX / GLYCOLAX) packet Take 17 g by mouth daily. 14 each 0  . sertraline (ZOLOFT) 100 MG tablet Take 100 mg by mouth every morning.    . simethicone (MYLICON) 80 MG chewable tablet Chew 1 tablet (80 mg total) by mouth 4 (four) times daily as needed for flatulence. 30 tablet 0    Objective: BP 124/65   Pulse (!) 103   Temp 97.6 F (36.4 C) (Oral)    Resp (!) 22   Ht _0  (1.575 m)   Wt 108 lb (49 kg)   SpO2 100%   BMI 19.75 kg/m  Exam: General: Elderly thin lady, Mild distress Eyes: PERRLA  ENTM: Moist mucosa membranes, Neck:  Positive for JVD, no lymphadenopathy Cardiovascular: Tachycardic, bounding pulses, no murmurs, intercostal retractions and abdominal breathing  Respiratory: Wheezing noted throughout, currently on 2 L satting 91%, Gastrointestinal: Bowel sounds present, no tenderness to palpation. MSK: No lower extremity edema, 2+ DP pulses  Derm: No rashes or ulcerations. Neuro: Strength bilaterally in upper and lower extremities 5 out of 5. No changes in sensation. Psych: Alert and orientated 3, cognition intact   Labs and Imaging: CBC BMET   Recent Labs Lab 05/04/17 0744  WBC 12.4*  HGB 14.6  HCT 45.0  PLT 270    Recent Labs Lab 05/04/17 0744  NA 134*  K 6.1*  CL 104  CO2 22  BUN 8  CREATININE 0.68  GLUCOSE 109*  CALCIUM 8.3*     Troponin 0.04   Dg Chest 2 View  Result Date: 05/04/2017 CLINICAL DATA:  Cough, shortness of breath, hypertension, COPD, smoker EXAM: CHEST  2 VIEW COMPARISON:  03/21/2017 FINDINGS: Normal heart size. Prominent central pulmonary arteries and hila question pulmonary arterial hypertension. Mediastinal contours otherwise normal. Atherosclerotic calcifications aorta. Emphysematous and bronchitic changes consistent with COPD. Bullous disease RIGHT upper lobe. Increased basilar markings versus previous study particularly on RIGHT cannot exclude superimposed infiltrate or atelectasis. Upper lungs clear. No pleural effusion or pneumothorax. Bones demineralized with chronic compression deformity of a lower thoracic vertebra unchanged. IMPRESSION: COPD changes with mild superimposed atelectasis versus infiltrate at lung bases especially on RIGHT. 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 Aortic Atherosclerosis (ICD10-I70.0) and Emphysema (ICD10-J43.9). Electronically Signed   By: Lavonia Dana M.D.   On: 05/04/2017 08:26    Tonette Bihari, MD 05/04/2017, 9:51 AM PGY-3, [facility] pager: [phone], text pages welcome

## 2017-05-04 NOTE — Progress Notes (Signed)
Saw Kayla Mack.  She appears comfortable and is talking easily without becoming short of breath.  She says she feels much improved since this morning.  She was satting at 95% on 4L O2.  On exam, heart was RRR, no MRG, lungs were had faint expiratory wheezing but were otherwise clear.  No intercostal retractions.  Abdomen soft, nontender, nondistended.  I informed her that our team will visit her to do a history and physical shortly.

## 2017-05-04 NOTE — Progress Notes (Signed)
RT was not able to obtain ABG sample. RT spoke with MD in the ED and told to not repeat.

## 2017-05-04 NOTE — Progress Notes (Signed)
PHARMACY NOTE:  ANTIMICROBIAL RENAL DOSAGE ADJUSTMENT  Current antimicrobial regimen includes a mismatch between antimicrobial dosage and estimated renal function.  As per policy approved by the Pharmacy & Therapeutics and Medical Executive Committees, the antimicrobial dosage will be adjusted accordingly.  Current antimicrobial dosage:  Levaquin 750 mg IV q24h   Indication: HCAP  Renal Function:  Estimated Creatinine Clearance: 47.4 mL/min (by C-G formula based on SCr of 0.83 mg/dL). _0      On intermittent HD, scheduled: _1      On CRRT    Antimicrobial dosage has been changed to:  Levaquin 750 mg IV q48h  Additional comments:   Thank you for allowing pharmacy to be a part of this patient's care.  Renold Genta, PharmD, [facility] Pharmacist Main pharmacy - [phone] 05/04/2017 11:54 AM

## 2017-05-04 NOTE — Progress Notes (Signed)
ANTICOAGULATION CONSULT NOTE - Initial Consult  Pharmacy Consult for Lovenox Indication: VTE prophylaxis  Allergies  Allergen Reactions  . Aleve [Naproxen Sodium] Hives  . Aspirin Swelling    Facial swelling  . Penicillins Hives and Swelling    Has patient had a PCN reaction causing immediate rash, facial/tongue/throat swelling, SOB or lightheadedness with hypotension: Yes Has patient had a PCN reaction causing severe rash involving mucus membranes or skin necrosis: Yes Has patient had a PCN reaction that required hospitalization: Unk Has patient had a PCN reaction occurring within the last 10 years: No If all of the above answers are "NO", then may proceed with Cephalosporin use.     Patient Measurements: Height: _0  (157.5 cm) Weight: 108 lb (49 kg) IBW/kg (Calculated) : 50.1  Vital Signs: Temp: 97.6 F (36.4 C) (07/18 0745) Temp Source: Oral (07/18 0745) BP: 97/47 (07/18 1215) Pulse Rate: 115 (07/18 1215)  Labs:  Recent Labs  05/04/17 0744 05/04/17 1044 05/04/17 1050  HGB 14.6  --  13.8  HCT 45.0  --  41.9  PLT 270  --  239  CREATININE 0.68 0.83  --   TROPONINI  --   --  0.08*    Estimated Creatinine Clearance: 47.4 mL/min (by C-G formula based on SCr of 0.83 mg/dL).   Medical History: Past Medical History:  Diagnosis Date  . Anxiety   . COPD (chronic obstructive pulmonary disease) ([place])   . Depression   . Hyperlipidemia   . Hypertension      Assessment: 71 yo female to start Lovenox for VTE prophylaxis.   Goal of Therapy:  Monitor platelets by anticoagulation protocol: Yes   Plan:  Lovenox 40 mg subq every 24 hours  Will follow peripherally  Kayla Mack, PharmD, BCPS 05/04/2017, 12:35 PM

## 2017-05-04 NOTE — Consult Note (Signed)
Cardiology Consultation:   Patient ID: Kayla Mack; [id number]; 1946/08/20   Admit date: 05/04/2017 Date of Consult: 05/04/2017  Primary Care Provider: System, Provider Not In Primary Cardiologist: new Primary Electrophysiologist:  na   Patient Profile:   Kayla Mack is a 71 y.o. female with a hx of HTN, HLD, COPD, + tobacco use and anxiety who is being seen today for the evaluation of abnormal EKG and ST at the request of Dr. Gwendlyn Deutscher..  History of Present Illness:   [name] with hx of HTN, HLD, COPD and anxiety, no prior cardiac issues except tachycardia and that resolved when placed on atenolol, presented to ER today with SOB.  She had been exposed to someone who was sick and she believes she caught it.   She has not felt well for 3 days.  + cough, + sore throat and no fever.  She is  SOB worse with exertion.  No chest pain.  She was so dyspnic she thought she would die and loss bladder control.   She was hypoxic in EMS P02 in the 70s and given albuterol neb and 125 mg IV solumedrol.     EKG SR with Q wave in V1 V2 and minimal ST depression inf leads.  With increase of AHR significant ST depression inf lat leads   --I personally reviewed.   troponins 0.04; 0.08 K+ 3.3 Na 136; C02 19, Glucose 213, Cr 0.83  WBC 10; H/H 13.8/ 41.9  TChol 118; HDL 31; LDL 72  TSH  0.687  CXR : COPD changes with mild superimposed atelectasis versus infiltrate at lung bases especially on RIGHT  Suspect Pul Art HTN - aortic atherosclerosis, emphysema    Currently she is SOB with any talking.  But feels much better.  She does admit to occ lt ant chest squeezing last a short time and resolves with rest.  She did have chest tightness but with breathing.  + FH CAD.  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    Past Medical History:  Diagnosis Date  . Anxiety   . COPD (chronic obstructive pulmonary disease) ([place])   . Depression   . Hyperlipidemia   . Hypertension     Past Surgical  History:  Procedure Laterality Date  . APPENDECTOMY    . SHOULDER SURGERY Right Pin  . TONSILLECTOMY       Inpatient Medications: Scheduled Meds: . atenolol  50 mg Oral q morning - 10a  . buPROPion  150 mg Oral Daily  . enoxaparin (LOVENOX) injection  40 mg Subcutaneous Q24H  . levalbuterol  0.63 mg Nebulization Q6H  . nicotine  21 mg Transdermal Daily  . PARoxetine  20 mg Oral Daily  . potassium chloride  40 mEq Oral BID  . pravastatin  20 mg Oral q1800  . [START ON 05/05/2017] predniSONE  50 mg Oral Q breakfast  . umeclidinium-vilanterol  1 puff Inhalation Daily   Continuous Infusions: . [START ON 05/06/2017] levofloxacin (LEVAQUIN) IV     PRN Meds: acetaminophen **OR** acetaminophen, clonazePAM  Allergies:    Allergies  Allergen Reactions  . Aleve [Naproxen Sodium] Hives  . Aspirin Swelling    Facial swelling  . Penicillins Hives and Swelling    Has patient had a PCN reaction causing immediate rash, facial/tongue/throat swelling, SOB or lightheadedness with hypotension: Yes Has patient had a PCN reaction causing severe rash involving mucus membranes or skin necrosis: Yes Has patient had a PCN reaction that required hospitalization: Unk  Has patient had a PCN reaction occurring within the last 10 years: No If all of the above answers are "NO", then may proceed with Cephalosporin use.     Social History:   Social History   Social History  . Marital status: Single    Spouse name: N/A  . Number of children: N/A  . Years of education: N/A   Occupational History  . Not on file.   Social History Main Topics  . Smoking status: Current Every Day Smoker    Packs/day: 1.50    Years: 40.00    Types: Cigarettes  . Smokeless tobacco: Never Used  . Alcohol use Yes     Comment: liquor   . Drug use: No  . Sexual activity: Not Currently   Other Topics Concern  . Not on file   Social History Narrative  . No narrative on file    Family History:   The patient's  family history includes Diabetes in her sister; Heart disease in her father, mother, and sister; Heart failure in her mother.  ROS:  Please see the history of present illness.  ROS  General:+ colds over last few days no fevers, no weight changes Skin:no rashes or ulcers HEENT:no blurred vision, no congestion, + sore throat CV:see HPI PUL:see HPI GI:no diarrhea constipation or melena, no indigestion GU:no hematuria, no dysuria MS:no joint pain, no claudication Neuro:no syncope, no lightheadedness Endo:no diabetes, no thyroid disease     Physical Exam/Data:   Vitals:   05/04/17 1345 05/04/17 1400 05/04/17 1430 05/04/17 1500  BP: (!) 102/58 (!) 102/55 (!) 91/53 93/61  Pulse: 91 92 84 84  Resp: (!) 25 (!) 24 (!) 22 (!) 22  Temp:      TempSrc:      SpO2: 97% 97% 96% 95%  Weight:      Height:        Intake/Output Summary (Last 24 hours) at 05/04/17 1518 Last data filed at 05/04/17 1453  Gross per 24 hour  Intake              650 ml  Output                0 ml  Net              650 ml   Filed Weights   05/04/17 0735  Weight: 108 lb (49 kg)   Body mass index is 19.75 kg/m.  General:  Thin frail female with dyspnea with talking, no chest pain HEENT: normal Lymph: no adenopathy Neck: no JVD Endocrine:  No thryomegaly Vascular: No carotid bruits; 2+ bil. Pedal pulses Cardiac:  normal S1, S2; RRR; no murmur gallup rub or click Lungs:  Bilateral breath sounds to auscultation bilaterally, + wheezing, + rhonchi no rales  Abd: soft, nontender, no hepatomegaly  Ext: no edema Musculoskeletal:  No deformities, BUE and BLE strength normal and equal Skin: warm and dry  Neuro:  Alert and oriented X 3 MAE follows commands + facial symmetry Psych:  Normal affect    Relevant CV Studies: No prior  Laboratory Data:  Chemistry  Recent Labs Lab 05/04/17 0744 05/04/17 1044  NA 134* 136  K 6.1* 3.3*  CL 104 105  CO2 22 19*  GLUCOSE 109* 213*  BUN 8 7  CREATININE 0.68  0.83  CALCIUM 8.3* 8.1*  GFRNONAA >60 >60  GFRAA >60 >60  ANIONGAP 8 12    No results for input(s): PROT, ALBUMIN, AST, ALT, ALKPHOS, BILITOT in  the last 168 hours. Hematology  Recent Labs Lab 05/04/17 0744 05/04/17 1050  WBC 12.4* 10.0  RBC 4.62 4.28  HGB 14.6 13.8  HCT 45.0 41.9  MCV 97.4 97.9  MCH 31.6 32.2  MCHC 32.4 32.9  RDW 14.4 14.5  PLT 270 239   Cardiac Enzymes  Recent Labs Lab 05/04/17 1050  TROPONINI 0.08*     Recent Labs Lab 05/04/17 0806  TROPIPOC 0.04    BNPNo results for input(s): BNP, PROBNP in the last 168 hours.  DDimer No results for input(s): DDIMER in the last 168 hours.  Radiology/Studies:  Dg Chest 2 View  Result Date: 05/04/2017 CLINICAL DATA:  Cough, shortness of breath, hypertension, COPD, smoker EXAM: CHEST  2 VIEW COMPARISON:  03/21/2017 FINDINGS: Normal heart size. Prominent central pulmonary arteries and hila question pulmonary arterial hypertension. Mediastinal contours otherwise normal. Atherosclerotic calcifications aorta. Emphysematous and bronchitic changes consistent with COPD. Bullous disease RIGHT upper lobe. Increased basilar markings versus previous study particularly on RIGHT cannot exclude superimposed infiltrate or atelectasis. Upper lungs clear. No pleural effusion or pneumothorax. Bones demineralized with chronic compression deformity of a lower thoracic vertebra unchanged. IMPRESSION: COPD changes with mild superimposed atelectasis versus infiltrate at lung bases especially on RIGHT. 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 Aortic Atherosclerosis (ICD10-I70.0) and Emphysema (ICD10-J43.9). Electronically Signed   By: Lavonia Dana M.D.   On: 05/04/2017 08:26    Assessment and Plan:   1.     Acute COPD exacerbation admitted by [facility] and on nebs  2.    ST may be related to meds hx of tachycardia in past treated with atenolol.  HR stable now  3.    ST depression in Inf lat leads check echo and when improved respirations cardiac  cath   4.     Elevated troponin  May be due to COPD exacerbation but needs serial troponin.+ FH CAD, + HTN, HLD   5.     HTN low at present  6.     HLD stable.on statin  7.     Tobacco use at 1.5 ppd, is now on Welbutrin to help stop, she tried Chantix with nausea, currently with nicoderm patch    Signed, Cecilie Kicks, NP  05/04/2017 3:18 PM   History and all data above reviewed.  Patient examined.  I was asked to see this patient with acute shortness of breath, elevated cardiac enzymes and dynamic EKG changes.  She has had progressive DOE and last night and this morning severe acute SOB.  She has recently been told that she has COPD and she is a long term smoker.  She came to the ED via EMS and she has been managed for a COPD flair.  At one point she had tachycardia with ST depression noted. This was not present at a slower heart rate but she clearly had dynamic diffuse ST depression with her tachycardia.  She does not report chest pain.  She has had no chest pressure.  She denies any past cardiac work up in the past.    I agree with the findings as above.  The patient exam reveals COR:RRR  ,  Lungs: Clear  ,  Abd: Positive bowel sounds, no rebound no guarding, Ext No edema  .  All available labs, radiology testing, previous records reviewed. Agree with documented assessment and plan. Abnormal EKG:  The patient has ruled in for a NSTEMI.  She had transient EKG changes.  She may wall have fixed obstructive CAD and will need  further evaluation and treatment.  However, this seems to be precipitated by her acute lung disease.  I agree with continued treatment of this.  She will be treated with ASA and heparin.  She will need elective cardiac cath and might be able to have this if her breathing is improved in the AM.    Jeneen Rinks Marrietta Thunder  4:18 PM  05/04/2017

## 2017-05-04 NOTE — ED Notes (Signed)
Attempted Report 

## 2017-05-04 NOTE — ED Provider Notes (Signed)
[facility] DEPT Provider Note   CSN: [id number] Arrival date & time: 05/04/17  3846     History   Chief Complaint Chief Complaint  Patient presents with  . Shortness of Breath    HPI Kayla Mack is a 71 y.o. female.  Pt is a current smoker with COPD presenting with SOB that began 3 days ago w/ sore throat.  Yesterday she developed a productive cough and increased shortness of breath and today she had a coughing fit and felt as if she could not catch her breath so she called EMS.  In route to ED EMS administered one dose of solumedrol and one albuterol breathing treatment which improved pts symptoms dramatically.  Pt denies fever, chest pain, nausea, vision changes, diarrhea or vomiting.        Past Medical History:  Diagnosis Date  . Anxiety   . COPD (chronic obstructive pulmonary disease) ([place])   . Depression   . Hyperlipidemia   . Hypertension     Patient Active Problem List   Diagnosis Date Noted  . Tobacco use disorder 06/04/2013  . T12 burst fracture ([place]) 06/03/2013  . Left radius fracture 06/03/2013  . Right calcaneal fracture 06/03/2013  . Fall from ladder 06/03/2013  . Osteopenia of the elderly 06/03/2013    Past Surgical History:  Procedure Laterality Date  . APPENDECTOMY    . SHOULDER SURGERY Right Pin  . TONSILLECTOMY      OB History    No data available       Home Medications    Prior to Admission medications   Medication Sig Start Date End Date Taking? Authorizing Provider  acetaminophen (TYLENOL) 500 MG tablet Take 1,000 mg by mouth 2 (two) times daily as needed for pain.     [provider]  atenolol (TENORMIN) 50 MG tablet Take 50 mg by mouth every morning.    [provider]  bisacodyl (DULCOLAX) 10 MG suppository Place 1 suppository (10 mg total) rectally daily as needed. 06/06/13   Riebock, Estill Bakes, NP  calcium-vitamin D (OSCAL WITH D) 500-200 MG-UNIT per tablet Take 1 tablet by mouth 2 (two) times daily.  06/06/13   Riebock, Estill Bakes, NP  clonazePAM (KLONOPIN) 0.5 MG tablet Take 1 tablet (0.5 mg total) by mouth 2 (two) times daily as needed for anxiety. 06/06/13   Riebock, Estill Bakes, NP  docusate sodium 100 MG CAPS Take 100 mg by mouth 2 (two) times daily. 06/06/13   Riebock, Estill Bakes, NP  feeding supplement (ENSURE) PUDG Take 1 Container by mouth 2 (two) times daily between meals. 06/06/13   Riebock, Estill Bakes, NP  HYDROcodone-acetaminophen (NORCO/VICODIN) 5-325 MG per tablet Take 0.5-2 tablets by mouth every 4 (four) hours as needed. 06/06/13   Riebock, Estill Bakes, NP  lovastatin (MEVACOR) 40 MG tablet Take 40 mg by mouth every morning.    [provider]  methocarbamol (ROBAXIN) 750 MG tablet Take 1 tablet (750 mg total) by mouth every 6 (six) hours as needed. 06/06/13   Riebock, Estill Bakes, NP  pantoprazole (PROTONIX) 40 MG tablet Take 1 tablet (40 mg total) by mouth daily. 06/06/13   Riebock, Estill Bakes, NP  polyethylene glycol (MIRALAX / GLYCOLAX) packet Take 17 g by mouth daily. 06/06/13   Riebock, Estill Bakes, NP  sertraline (ZOLOFT) 100 MG tablet Take 100 mg by mouth every morning.    [provider]  simethicone (MYLICON) 80 MG chewable tablet Chew 1 tablet (80 mg total) by mouth 4 (four) times daily as needed for flatulence. 06/06/13  Erby Pian, NP    Family History History reviewed. No pertinent family history.  Social History Social History  Substance Use Topics  . Smoking status: Current Every Day Smoker    Packs/day: 1.50    Years: 40.00    Types: Cigarettes  . Smokeless tobacco: Never Used  . Alcohol use Yes     Comment: liquor      Allergies   Aleve [naproxen sodium]; Aspirin; and Penicillins   Review of Systems Review of Systems  Constitutional: Negative for chills, diaphoresis and fever.  HENT: Positive for congestion and postnasal drip. Negative for ear pain and sore throat.   Eyes: Negative for photophobia, pain and visual disturbance.  Respiratory: Positive for cough, shortness  of breath and wheezing.   Cardiovascular: Negative for chest pain and palpitations.  Gastrointestinal: Negative for abdominal pain and vomiting.  Endocrine: Negative for cold intolerance and heat intolerance.  Genitourinary: Negative for difficulty urinating, dysuria and hematuria.  Musculoskeletal: Negative for arthralgias and back pain.  Skin: Negative for color change and rash.  Neurological: Positive for headaches. Negative for dizziness, seizures, syncope and weakness.  Psychiatric/Behavioral: Negative for agitation and behavioral problems. The patient is nervous/anxious.   All other systems reviewed and are negative.    Physical Exam Updated Vital Signs BP 127/68 (BP Location: Right Arm)   Pulse (!) 107   Temp 97.6 F (36.4 C) (Oral)   Resp (!) 25   Ht _0  (1.575 m)   Wt 49 kg (108 lb)   SpO2 94%   BMI 19.75 kg/m   Physical Exam  Constitutional: She is oriented to person, place, and time. She appears well-developed and well-nourished. No distress.  HENT:  Head: Normocephalic and atraumatic.  Eyes: Conjunctivae are normal.  Neck: Neck supple. No JVD present. No tracheal deviation present. No thyromegaly present.  Cardiovascular: Regular rhythm and normal heart sounds.  Tachycardia present.   No murmur heard. Pulmonary/Chest: Accessory muscle usage present. Tachypnea noted. She has wheezes.  Abdominal: Soft. She exhibits no distension. There is no tenderness. There is no guarding.  Musculoskeletal: She exhibits no edema or deformity.  Neurological: She is alert and oriented to person, place, and time.  Skin: Skin is warm and dry. She is not diaphoretic.  Psychiatric: She has a normal mood and affect.  Nursing note and vitals reviewed.    ED Treatments / Results  Labs (all labs ordered are listed, but only abnormal results are displayed) Labs Reviewed  I-STAT VENOUS BLOOD GAS, ED - Abnormal; Notable for the following:       Result Value   pO2, Ven 51.0 (*)     All other components within normal limits  CBC WITH DIFFERENTIAL/PLATELET  BASIC METABOLIC PANEL  I-STAT TROPOININ, ED    EKG  EKG Interpretation  Date/Time:  Wednesday May 04 2017 07:43:32 EDT Ventricular Rate:  101 PR Interval:    QRS Duration: 76 QT Interval:  346 QTC Calculation: 449 R Axis:   92 Text Interpretation:  Age not entered, assumed to be  71 years old for purpose of ECG interpretation Sinus tachycardia Borderline right axis deviation Anteroseptal infarct, old Minimal ST depression, inferior leads Normal sinus rhythm Confirmed by Thomasene Lot, Courteney [phone]) on 05/04/2017 7:48:34 AM       Radiology Dg Chest 2 View  Result Date: 05/04/2017 CLINICAL DATA:  Cough, shortness of breath, hypertension, COPD, smoker EXAM: CHEST  2 VIEW COMPARISON:  03/21/2017 FINDINGS: Normal heart size. Prominent central pulmonary arteries and hila question  pulmonary arterial hypertension. Mediastinal contours otherwise normal. Atherosclerotic calcifications aorta. Emphysematous and bronchitic changes consistent with COPD. Bullous disease RIGHT upper lobe. Increased basilar markings versus previous study particularly on RIGHT cannot exclude superimposed infiltrate or atelectasis. Upper lungs clear. No pleural effusion or pneumothorax. Bones demineralized with chronic compression deformity of a lower thoracic vertebra unchanged. IMPRESSION: COPD changes with mild superimposed atelectasis versus infiltrate at lung bases especially on RIGHT. 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 Aortic Atherosclerosis (ICD10-I70.0) and Emphysema (ICD10-J43.9). Electronically Signed   By: Lavonia Dana M.D.   On: 05/04/2017 08:26    Procedures Procedures (including critical care time)  Medications Ordered in ED Medications  ipratropium-albuterol (DUONEB) 0.5-2.5 (3) MG/3ML nebulizer solution 3 mL (3 mLs Nebulization Given 05/04/17 0825)     Initial Impression / Assessment and Plan / ED Course  I have reviewed the  triage vital signs and the nursing notes.  Pertinent labs & imaging results that were available during my care of the patient were reviewed by me and considered in my medical decision making (see chart for details).     COPD Exacerbation 2/2 Respiratory infection -CBC, BMP -ACS rule out -Duo Neb continuous, VBG -Chest X-Ray shows lower lobe infiltrate greater on R, combined with history and symptoms will treat for CAP with levo   Vickki Muff MD PGY-1 Internal Medicine Pager # [phone]  Final Clinical Impressions(s) / ED Diagnoses   Final diagnoses:  COPD exacerbation [facility])    New Prescriptions New Prescriptions   No medications on file     Katherine Roan, MD 05/04/17 0992    Macarthur Critchley, MD 05/05/17 [phone]

## 2017-05-04 NOTE — ED Triage Notes (Signed)
Pt from home with shortness of breath. Pt has been around someone who is sick and feels she has now got what they had. Pt was given 15m Albuterol with EMS and [facility]

## 2017-05-04 NOTE — Progress Notes (Signed)
ANTICOAGULATION CONSULT NOTE - Initial Consult  Pharmacy Consult for heparin  Indication: chest pain/ACS  Allergies  Allergen Reactions  . Aleve [Naproxen Sodium] Hives  . Aspirin Swelling    Facial swelling  . Penicillins Hives and Swelling    Has patient had a PCN reaction causing immediate rash, facial/tongue/throat swelling, SOB or lightheadedness with hypotension: Yes Has patient had a PCN reaction causing severe rash involving mucus membranes or skin necrosis: Yes Has patient had a PCN reaction that required hospitalization: Unk Has patient had a PCN reaction occurring within the last 10 years: No If all of the above answers are "NO", then may proceed with Cephalosporin use.     Patient Measurements: Height: _0  (157.5 cm) Weight: 108 lb (49 kg) IBW/kg (Calculated) : 50.1 Heparin Dosing Weight: 49  Vital Signs: Temp: 97.6 F (36.4 C) (07/18 0745) Temp Source: Oral (07/18 0745) BP: 104/60 (07/18 1530) Pulse Rate: 83 (07/18 1530)  Labs:  Recent Labs  05/04/17 0744 05/04/17 1044 05/04/17 1050  HGB 14.6  --  13.8  HCT 45.0  --  41.9  PLT 270  --  239  CREATININE 0.68 0.83  --   TROPONINI  --   --  0.08*    Estimated Creatinine Clearance: 47.4 mL/min (by C-G formula based on SCr of 0.83 mg/dL).   Medical History: Past Medical History:  Diagnosis Date  . Anxiety   . COPD (chronic obstructive pulmonary disease) ([place])   . Depression   . Hyperlipidemia   . Hypertension    Assessment: 71 Y/O F with PMX of COPD, Anxiety, HTN, HLD presented with hx of SOB worsening over the last three days. ECG changes noted, heparin ordered for possible ACS. Lovenox ordered earlier but never given.  Goal of Therapy:  Heparin level 0.3-0.7 units/ml Monitor platelets by anticoagulation protocol: Yes   Plan:  Give 3000 units bolus x 1 Start heparin infusion at 600 units/hr Check anti-Xa level in 8 hours and daily while on heparin Continue to monitor H&H and  platelets  Erin Hearing PharmD., BCPS Clinical Pharmacist Pager [phone] 05/04/2017 4:21 PM

## 2017-05-05 ENCOUNTER — Inpatient Hospital Stay (HOSPITAL_COMMUNITY): Payer: Medicare Other

## 2017-05-05 DIAGNOSIS — J441 Chronic obstructive pulmonary disease with (acute) exacerbation: Principal | ICD-10-CM

## 2017-05-05 DIAGNOSIS — J9602 Acute respiratory failure with hypercapnia: Secondary | ICD-10-CM

## 2017-05-05 DIAGNOSIS — J439 Emphysema, unspecified: Secondary | ICD-10-CM

## 2017-05-05 DIAGNOSIS — I340 Nonrheumatic mitral (valve) insufficiency: Secondary | ICD-10-CM

## 2017-05-05 DIAGNOSIS — J189 Pneumonia, unspecified organism: Secondary | ICD-10-CM

## 2017-05-05 DIAGNOSIS — E875 Hyperkalemia: Secondary | ICD-10-CM

## 2017-05-05 DIAGNOSIS — I248 Other forms of acute ischemic heart disease: Secondary | ICD-10-CM

## 2017-05-05 DIAGNOSIS — R0602 Shortness of breath: Secondary | ICD-10-CM

## 2017-05-05 DIAGNOSIS — R778 Other specified abnormalities of plasma proteins: Secondary | ICD-10-CM

## 2017-05-05 DIAGNOSIS — J181 Lobar pneumonia, unspecified organism: Secondary | ICD-10-CM

## 2017-05-05 DIAGNOSIS — R7989 Other specified abnormal findings of blood chemistry: Secondary | ICD-10-CM

## 2017-05-05 LAB — CBC
HCT: 39.8 % (ref 36.0–46.0)
Hemoglobin: 12.8 g/dL (ref 12.0–15.0)
MCH: 31.1 pg (ref 26.0–34.0)
MCHC: 32.2 g/dL (ref 30.0–36.0)
MCV: 96.8 fL (ref 78.0–100.0)
Platelets: 236 10*3/uL (ref 150–400)
RBC: 4.11 MIL/uL (ref 3.87–5.11)
RDW: 14.3 % (ref 11.5–15.5)
WBC: 11.7 10*3/uL — ABNORMAL HIGH (ref 4.0–10.5)

## 2017-05-05 LAB — POTASSIUM: Potassium: 6 mmol/L — ABNORMAL HIGH (ref 3.5–5.1)

## 2017-05-05 LAB — HEPARIN LEVEL (UNFRACTIONATED)
Heparin Unfractionated: 0.24 IU/mL — ABNORMAL LOW (ref 0.30–0.70)
Heparin Unfractionated: 0.15 IU/mL — ABNORMAL LOW (ref 0.30–0.70)
Heparin Unfractionated: 0.42 IU/mL (ref 0.30–0.70)

## 2017-05-05 LAB — EXPECTORATED SPUTUM ASSESSMENT W GRAM STAIN, RFLX TO RESP C

## 2017-05-05 LAB — BASIC METABOLIC PANEL
Anion gap: 7 (ref 5–15)
Anion gap: 7 (ref 5–15)
BUN: 10 mg/dL (ref 6–20)
BUN: 15 mg/dL (ref 6–20)
CO2: 21 mmol/L — ABNORMAL LOW (ref 22–32)
CO2: 22 mmol/L (ref 22–32)
Calcium: 8.2 mg/dL — ABNORMAL LOW (ref 8.9–10.3)
Calcium: 8.4 mg/dL — ABNORMAL LOW (ref 8.9–10.3)
Chloride: 105 mmol/L (ref 101–111)
Chloride: 105 mmol/L (ref 101–111)
Creatinine, Ser: 0.71 mg/dL (ref 0.44–1.00)
Creatinine, Ser: 0.77 mg/dL (ref 0.44–1.00)
GFR calc Af Amer: >60 mL/min (ref >60)
GFR calc Af Amer: >60 mL/min (ref >60)
GFR calc non Af Amer: >60 mL/min (ref >60)
GFR calc non Af Amer: >60 mL/min (ref >60)
Glucose, Bld: 121 mg/dL — ABNORMAL HIGH (ref 65–99)
Glucose, Bld: 118 mg/dL — ABNORMAL HIGH (ref 65–99)
Potassium: 5.8 mmol/L — ABNORMAL HIGH (ref 3.5–5.1)
Potassium: 5.4 mmol/L — ABNORMAL HIGH (ref 3.5–5.1)
Sodium: 133 mmol/L — ABNORMAL LOW (ref 135–145)
Sodium: 134 mmol/L — ABNORMAL LOW (ref 135–145)

## 2017-05-05 LAB — BLOOD GAS, ARTERIAL
Acid-base deficit: 3.4 mmol/L — ABNORMAL HIGH (ref 0.0–2.0)
Bicarbonate: 22.8 mmol/L (ref 20.0–28.0)
Delivery systems: POSITIVE
Drawn by: 41977
Expiratory PAP: 5
FIO2: 50
Inspiratory PAP: 12
O2 Saturation: 98.8 %
Patient temperature: 98.6
RATE: 10 resp/min
pCO2 arterial: 53.9 mmHg — ABNORMAL HIGH (ref 32.0–48.0)
pH, Arterial: 7.249 — ABNORMAL LOW (ref 7.350–7.450)
pO2, Arterial: 167 mmHg — ABNORMAL HIGH (ref 83.0–108.0)

## 2017-05-05 LAB — ECHOCARDIOGRAM COMPLETE
Height: 62 in
Weight: 1770.73 oz

## 2017-05-05 LAB — TROPONIN I: Troponin I: 0.14 ng/mL (ref <0.03)

## 2017-05-05 LAB — HEMOGLOBIN A1C
Hgb A1c MFr Bld: 5.8 % — ABNORMAL HIGH (ref 4.8–5.6)
Mean Plasma Glucose: 120 mg/dL

## 2017-05-05 LAB — EXPECTORATED SPUTUM ASSESSMENT W REFEX TO RESP CULTURE

## 2017-05-05 LAB — D-DIMER, QUANTITATIVE (NOT AT ARMC): D-Dimer, Quant: 0.39 ug/mL-FEU (ref 0.00–0.50)

## 2017-05-05 MED ORDER — HEPARIN BOLUS VIA INFUSION
1500.0000 [IU] | Freq: Once | INTRAVENOUS | Status: AC
  Administered 2017-05-05: 1500 [IU] via INTRAVENOUS
  Filled 2017-05-05: qty 1500

## 2017-05-05 MED ORDER — LEVALBUTEROL HCL 0.63 MG/3ML IN NEBU
0.6300 mg | INHALATION_SOLUTION | RESPIRATORY_TRACT | Status: DC | PRN
  Administered 2017-05-05: 0.63 mg via RESPIRATORY_TRACT

## 2017-05-05 MED ORDER — INSULIN ASPART 100 UNIT/ML ~~LOC~~ SOLN
10.0000 [IU] | Freq: Once | SUBCUTANEOUS | Status: AC
  Administered 2017-05-05: 10 [IU] via INTRAVENOUS

## 2017-05-05 MED ORDER — METHYLPREDNISOLONE SODIUM SUCC 125 MG IJ SOLR
60.0000 mg | Freq: Four times a day (QID) | INTRAMUSCULAR | Status: DC
  Administered 2017-05-05 – 2017-05-08 (×12): 60 mg via INTRAVENOUS
  Filled 2017-05-05: qty 2
  Filled 2017-05-05: qty 0.96
  Filled 2017-05-05: qty 2
  Filled 2017-05-05 (×2): qty 0.96
  Filled 2017-05-05: qty 2
  Filled 2017-05-05 (×2): qty 0.96
  Filled 2017-05-05 (×2): qty 2
  Filled 2017-05-05 (×4): qty 0.96

## 2017-05-05 MED ORDER — LEVALBUTEROL HCL 0.63 MG/3ML IN NEBU
0.6300 mg | INHALATION_SOLUTION | RESPIRATORY_TRACT | Status: DC | PRN
  Administered 2017-05-05 – 2017-05-06 (×3): 0.63 mg via RESPIRATORY_TRACT
  Filled 2017-05-05 (×3): qty 3

## 2017-05-05 MED ORDER — MORPHINE SULFATE (PF) 2 MG/ML IV SOLN
2.0000 mg | Freq: Once | INTRAVENOUS | Status: AC
  Administered 2017-05-05: 2 mg via INTRAVENOUS

## 2017-05-05 MED ORDER — IPRATROPIUM BROMIDE 0.02 % IN SOLN
0.5000 mg | Freq: Four times a day (QID) | RESPIRATORY_TRACT | Status: DC
  Administered 2017-05-05 – 2017-05-08 (×13): 0.5 mg via RESPIRATORY_TRACT
  Filled 2017-05-05 (×13): qty 2.5

## 2017-05-05 MED ORDER — BISOPROLOL FUMARATE 5 MG PO TABS
5.0000 mg | ORAL_TABLET | Freq: Every day | ORAL | Status: DC
  Administered 2017-05-05 – 2017-05-13 (×9): 5 mg via ORAL
  Filled 2017-05-05 (×9): qty 1

## 2017-05-05 MED ORDER — MORPHINE SULFATE (PF) 2 MG/ML IV SOLN
INTRAVENOUS | Status: AC
  Administered 2017-05-05: 2 mg via INTRAVENOUS
  Filled 2017-05-05: qty 1

## 2017-05-05 MED ORDER — MORPHINE SULFATE (PF) 2 MG/ML IV SOLN: 2.0000 mg | Freq: Once | INTRAVENOUS | Status: DC

## 2017-05-05 MED ORDER — IPRATROPIUM-ALBUTEROL 0.5-2.5 (3) MG/3ML IN SOLN: 3.0000 mL | RESPIRATORY_TRACT | Status: DC

## 2017-05-05 MED ORDER — BUDESONIDE 0.5 MG/2ML IN SUSP
0.5000 mg | Freq: Two times a day (BID) | RESPIRATORY_TRACT | Status: DC
  Administered 2017-05-05 – 2017-05-09 (×9): 0.5 mg via RESPIRATORY_TRACT
  Filled 2017-05-05 (×10): qty 2

## 2017-05-05 MED ORDER — DEXTROSE 50 % IV SOLN
50.0000 mL | Freq: Once | INTRAVENOUS | Status: AC
  Administered 2017-05-05: 50 mL via INTRAVENOUS
  Filled 2017-05-05: qty 50

## 2017-05-05 MED ORDER — METHYLPREDNISOLONE SODIUM SUCC 125 MG IJ SOLR
125.0000 mg | Freq: Once | INTRAMUSCULAR | Status: AC
  Administered 2017-05-05: 125 mg via INTRAVENOUS

## 2017-05-05 MED ORDER — ENSURE ENLIVE PO LIQD
237.0000 mL | Freq: Two times a day (BID) | ORAL | Status: DC
  Administered 2017-05-05 – 2017-05-13 (×11): 237 mL via ORAL

## 2017-05-05 MED ORDER — METHYLPREDNISOLONE SODIUM SUCC 125 MG IJ SOLR: 125.0000 mg | Freq: Once | INTRAMUSCULAR | Status: DC

## 2017-05-05 MED ORDER — ARFORMOTEROL TARTRATE 15 MCG/2ML IN NEBU
15.0000 ug | INHALATION_SOLUTION | Freq: Two times a day (BID) | RESPIRATORY_TRACT | Status: DC
  Administered 2017-05-05 – 2017-05-09 (×9): 15 ug via RESPIRATORY_TRACT
  Filled 2017-05-05 (×10): qty 2

## 2017-05-05 MED ORDER — METHYLPREDNISOLONE SODIUM SUCC 125 MG IJ SOLR
INTRAMUSCULAR | Status: AC
  Administered 2017-05-05: 125 mg via INTRAVENOUS
  Filled 2017-05-05: qty 2

## 2017-05-05 NOTE — Consult Note (Signed)
Name: [name] MRN: [id number] DOB: 02-11-1946    ADMISSION DATE:  05/04/2017 CONSULTATION DATE:  05/04/17  REFERRING MD :  Gwendlyn Deutscher  CHIEF COMPLAINT:  SOB   HISTORY OF PRESENT ILLNESS:  Kayla Mack is a 71 y.o. female with a PMH as outlined below including but not limited to COPD.  She was admitted 7/18 with SOB x 3 days along with cough, presumed due to AECOPD.  She has a long standing smoking history, roughly 75 pack years. She was admitted and was started on levaquin and BD's.  During early AM hours 7/19, PCCM was called by nursing staff for assistance due to increased WOB.  Pt has apparently had increased WOB throughout the night and now has worsening wheezing.  She was on bedside commode earlier and desaturated into the high 80's.  She was then placed on VM and had improvement in sats to high 90's.  Currently sats 98% but has increased WOB along with significant wheeze. Has not received any steroids since admission.  Denies chest pain, lightheadedness.  PAST MEDICAL HISTORY :   has a past medical history of Anxiety; COPD (chronic obstructive pulmonary disease) ([place]); Depression; Hyperlipidemia; and Hypertension.  has a past surgical history that includes Appendectomy; Tonsillectomy; and Shoulder surgery (Right, Pin). Prior to Admission medications   Medication Sig Start Date End Date Taking? Authorizing Provider  acetaminophen (TYLENOL) 500 MG tablet Take 1,000 mg by mouth 2 (two) times daily as needed for pain.    Yes [provider]  ANORO ELLIPTA 62.5-25 MCG/INH AEPB Inhale 1 puff into the lungs daily. 04/18/17  Yes [provider]  atenolol (TENORMIN) 50 MG tablet Take 50 mg by mouth every morning.   Yes [provider]  buPROPion (WELLBUTRIN SR) 150 MG 12 hr tablet Take 150 mg by mouth daily.   Yes [provider]  clonazePAM (KLONOPIN) 0.5 MG tablet Take 1 tablet (0.5 mg total) by mouth 2 (two) times daily as needed for  anxiety. Patient taking differently: Take 0.5 mg by mouth daily as needed for anxiety.  06/06/13  Yes Riebock, Emina, NP  lovastatin (MEVACOR) 40 MG tablet Take 40 mg by mouth every morning.   Yes [provider]  nicotine (NICODERM CQ - DOSED IN MG/24 HOURS) 21 mg/24hr patch Place 21 mg onto the skin daily.   Yes [provider]  PARoxetine (PAXIL) 20 MG tablet Take 20 mg by mouth daily.   Yes [provider]  Vitamin D, Ergocalciferol, (DRISDOL) 50000 units CAPS capsule Take 50,000 Units by mouth every 7 (seven) days.   Yes [provider]  bisacodyl (DULCOLAX) 10 MG suppository Place 1 suppository (10 mg total) rectally daily as needed. Patient not taking: Reported on 05/04/2017 06/06/13   Erby Pian, NP  calcium-vitamin D (OSCAL WITH D) 500-200 MG-UNIT per tablet Take 1 tablet by mouth 2 (two) times daily. 06/06/13   Riebock, Estill Bakes, NP  docusate sodium 100 MG CAPS Take 100 mg by mouth 2 (two) times daily. Patient not taking: Reported on 05/04/2017 06/06/13   Erby Pian, NP  feeding supplement (ENSURE) PUDG Take 1 Container by mouth 2 (two) times daily between meals. Patient not taking: Reported on 05/04/2017 06/06/13   Erby Pian, NP   Allergies  Allergen Reactions  . Aleve [Naproxen Sodium] Hives  . Aspirin Swelling    Facial swelling  . Penicillins Hives and Swelling    Has patient had a PCN reaction causing immediate rash, facial/tongue/throat swelling, SOB or  lightheadedness with hypotension: Yes Has patient had a PCN reaction causing severe rash involving mucus membranes or skin necrosis: Yes Has patient had a PCN reaction that required hospitalization: Unk Has patient had a PCN reaction occurring within the last 10 years: No If all of the above answers are "NO", then may proceed with Cephalosporin use.     FAMILY HISTORY:  family history includes Diabetes in her sister; Heart disease in her father, mother, and sister; Heart failure in her  mother. SOCIAL HISTORY:  reports that she has been smoking Cigarettes.  She has a 60.00 pack-year smoking history. She has never used smokeless tobacco. She reports that she drinks alcohol. She reports that she does not use drugs.  REVIEW OF SYSTEMS:   All negative; except for those that are bolded, which indicate positives.  Constitutional: weight loss, weight gain, night sweats, fevers, chills, fatigue, weakness.  HEENT: headaches, sore throat, sneezing, nasal congestion, post nasal drip, difficulty swallowing, tooth/dental problems, visual complaints, visual changes, ear aches. Neuro: difficulty with speech, weakness, numbness, ataxia. CV:  chest pain, orthopnea, PND, swelling in lower extremities, dizziness, palpitations, syncope.  Resp: cough, hemoptysis, dyspnea, wheezing. GI: heartburn, indigestion, abdominal pain, nausea, vomiting, diarrhea, constipation, change in bowel habits, loss of appetite, hematemesis, melena, hematochezia.  GU: dysuria, change in color of urine, urgency or frequency, flank pain, hematuria. MSK: joint pain or swelling, decreased range of motion. Psych: change in mood or affect, depression, anxiety, suicidal ideations, homicidal ideations. Skin: rash, itching, bruising.    SUBJECTIVE:  Has significant wheezing with increased WOB.  VITAL SIGNS: Temp:  [97.6 F (36.4 C)-99.2 F (37.3 C)] 97.6 F (36.4 C) (07/19 0237) Pulse Rate:  [78-138] 92 (07/19 0237) Resp:  [11-30] 24 (07/19 0237) BP: (88-137)/(47-91) 137/75 (07/19 0237) SpO2:  [88 %-100 %] 98 % (07/19 0606) Weight:  [49 kg (108 lb)-50.2 kg (110 lb 10.7 oz)] 50.2 kg (110 lb 10.7 oz) (07/19 0237)  PHYSICAL EXAMINATION: General: Adult female, in mild respiratory distress. Neuro: A&O x 3, non-focal.  HEENT: [place]/AT. PERRL, sclerae anicteric. Cardiovascular: RRR, no M/R/G.  Lungs: Respirations mildly labored.  Diffuse wheezes throughout. Abdomen: BS x 4, soft, NT/ND.  Musculoskeletal: No gross  deformities, no edema.  Skin: Intact, warm, no rashes.     Recent Labs Lab 05/04/17 0744 05/04/17 1044 05/05/17 0053 05/05/17 0230  NA 134* 136 133*  --   K 6.1* 3.3* 5.8* 6.0*  CL 104 105 105  --   CO2 22 19* 21*  --   BUN _0 --   CREATININE 0.68 0.83 0.71  --   GLUCOSE 109* 213* 121*  --     Recent Labs Lab 05/04/17 0744 05/04/17 1050 05/05/17 0053  HGB 14.6 13.8 12.8  HCT 45.0 41.9 39.8  WBC 12.4* 10.0 11.7*  PLT 270 239 236   Dg Chest 2 View  Result Date: 05/04/2017 CLINICAL DATA:  Cough, shortness of breath, hypertension, COPD, smoker EXAM: CHEST  2 VIEW COMPARISON:  03/21/2017 FINDINGS: Normal heart size. Prominent central pulmonary arteries and hila question pulmonary arterial hypertension. Mediastinal contours otherwise normal. Atherosclerotic calcifications aorta. Emphysematous and bronchitic changes consistent with COPD. Bullous disease RIGHT upper lobe. Increased basilar markings versus previous study particularly on RIGHT cannot exclude superimposed infiltrate or atelectasis. Upper lungs clear. No pleural effusion or pneumothorax. Bones demineralized with chronic compression deformity of a lower thoracic vertebra unchanged. IMPRESSION: COPD changes with mild superimposed atelectasis versus infiltrate at lung bases especially on RIGHT. Suspect pulmonary  artery hypertension. Aortic Atherosclerosis (ICD10-I70.0) and Emphysema (ICD10-J43.9). Electronically Signed   By: Lavonia Dana M.D.   On: 05/04/2017 08:26    STUDIES:  CXR 7/18 > COPD changes.  SIGNIFICANT EVENTS  7/18 > admit. 7/19 > PCCM consult.  ASSESSMENT / PLAN:  Acute hypoxic respiratory failure - due to AECOPD. AECOPD - apparently told she has COPD just 2 months ago; however, no PFT's available in system. Tobacco dependence. Plan: Start BiPAP now. Assess ABG. Low threshold to move to ICU in next few hours if continues to have increased WOB despite measures below - will ask day team to check  back on pt in next hour after starting BiPAP and administering steroids, BD's, morphine. Start IV steroids. Continue BD's, doses adjusted. 65m morphine now. Tobacco cessation counseling. CXR in AM. Needs PFT's once over acute issues and to establish with pulmonary as outpatient.  Elevated troponin - suspect demand ischemia.  Has been started on heparin for ACS. Plan: Repeat troponin, EKG. Cardiology following, planning for elective cath once over acute issues. F/u on echo results.  Hx HTN, HLD. Plan: Continue atenolol, pravastatin.  Hyperkalemia. Plan: 10u insulin, 1 amp D50 now. Repeat BMP at 1000.  Anxiety. Plan: Continue bupropion, klonopin.   RMontey Hora PCasper MountainPulmonary & Critical Care Medicine Pager: [phone] or ([phone]/19/2018, 6:24 AM

## 2017-05-05 NOTE — Progress Notes (Signed)
Placed pt on BiPAP at this time, pt tolerating well. RT to monitor as needed

## 2017-05-05 NOTE — Progress Notes (Signed)
Pt on BSC with tech, RN heard pt yelling " help me I can't breath". When RN arrived to room pt had increased WOB, sats dropped to 80% on 4L, pt tachypenic with accessory muscles use. Pt has wheezing and rhonchi upon asculation RN applied NRB sats came up to 100% and pt helped back to bed.  MD paged, RRT and Rapid Response RN called to room. Charge RN called elink and PCCM NP arrived. PCCM NP gave verbal order for Solumedrol 125 mg and morphine 32m which was given by Rapid Response RN. Pt placed on Bipap, CXR, ABG and EKG obtained.   Pt now resting on bipap with improvement in work of breathing. Will continue to monitor closely and give information to day shift RN.

## 2017-05-05 NOTE — Progress Notes (Signed)
Progress Note  Patient Name: Kayla Mack Date of Encounter: 05/05/2017  Primary Cardiologist:   New (Dr. Percival Spanish)  Subjective   Acute respiratory distress again overnight.   She did report a pressure on her chest when she was having the acute dyspnea.   Inpatient Medications    Scheduled Meds: . arformoterol  15 mcg Nebulization BID  . atenolol  50 mg Oral q morning - 10a  . budesonide (PULMICORT) nebulizer solution  0.5 mg Nebulization BID  . buPROPion  150 mg Oral Daily  . ipratropium  0.5 mg Nebulization Q6H  . methylPREDNISolone (SOLU-MEDROL) injection  60 mg Intravenous Q6H  . nicotine  21 mg Transdermal Daily  . PARoxetine  20 mg Oral Daily  . pravastatin  20 mg Oral q1800   Continuous Infusions: . heparin 700 Units/hr (05/05/17 0122)  . [START ON 05/06/2017] levofloxacin (LEVAQUIN) IV     PRN Meds: acetaminophen **OR** acetaminophen, clonazePAM, levalbuterol   Vital Signs    Vitals:   05/05/17 0606 05/05/17 0611 05/05/17 0641 05/05/17 0700  BP:  (!) 146/127 (!) 131/113   Pulse:  100 76   Resp:  (!) 23 (!) 33   Temp:    98 F (36.7 C)  TempSrc:    Axillary  SpO2: 98% 98% 97%   Weight:      Height:        Intake/Output Summary (Last 24 hours) at 05/05/17 0831 Last data filed at 05/05/17 0606  Gross per 24 hour  Intake           714.03 ml  Output              250 ml  Net           464.03 ml   Filed Weights   05/04/17 0735 05/05/17 0237  Weight: 108 lb (49 kg) 110 lb 10.7 oz (50.2 kg)    Telemetry    NSR, with PVCs - Personally Reviewed  ECG    NSR, rate 90, axis WNL, intervals WNL, no acute ST T wave changes.  05/05/2017 - Personally Reviewed  Physical Exam   GEN:  Cough and mild dyspnea but not in distress  Neck: No  JVD Cardiac: RRR, no murmurs, rubs, or gallops.  Respiratory: Decreased breath sounds with rhonchi.  GI: Soft, nontender, non-distended  MS: No  edema; No deformity. Neuro:  Nonfocal  Psych: Normal affect   Labs      Chemistry Recent Labs Lab 05/04/17 0744 05/04/17 1044 05/05/17 0053 05/05/17 0230  NA 134* 136 133*  --   K 6.1* 3.3* 5.8* 6.0*  CL 104 105 105  --   CO2 22 19* 21*  --   GLUCOSE 109* 213* 121*  --   BUN _0 --   CREATININE 0.68 0.83 0.71  --   CALCIUM 8.3* 8.1* 8.2*  --   GFRNONAA >60 >60 >60  --   GFRAA >60 >60 >60  --   ANIONGAP _1 --      Hematology Recent Labs Lab 05/04/17 0744 05/04/17 1050 05/05/17 0053  WBC 12.4* 10.0 11.7*  RBC 4.62 4.28 4.11  HGB 14.6 13.8 12.8  HCT 45.0 41.9 39.8  MCV 97.4 97.9 96.8  MCH 31.6 32.2 31.1  MCHC 32.4 32.9 32.2  RDW 14.4 14.5 14.3  PLT 270 239 236    Cardiac Enzymes Recent Labs Lab 05/04/17 1050 05/04/17 1641 05/04/17 2250  TROPONINI 0.08* 0.07* 0.05*  Recent Labs Lab 05/04/17 0806  TROPIPOC 0.04     BNPNo results for input(s): BNP, PROBNP in the last 168 hours.   DDimer No results for input(s): DDIMER in the last 168 hours.   Radiology    Dg Chest 2 View  Result Date: 05/04/2017 CLINICAL DATA:  Cough, shortness of breath, hypertension, COPD, smoker EXAM: CHEST  2 VIEW COMPARISON:  03/21/2017 FINDINGS: Normal heart size. Prominent central pulmonary arteries and hila question pulmonary arterial hypertension. Mediastinal contours otherwise normal. Atherosclerotic calcifications aorta. Emphysematous and bronchitic changes consistent with COPD. Bullous disease RIGHT upper lobe. Increased basilar markings versus previous study particularly on RIGHT cannot exclude superimposed infiltrate or atelectasis. Upper lungs clear. No pleural effusion or pneumothorax. Bones demineralized with chronic compression deformity of a lower thoracic vertebra unchanged. IMPRESSION: COPD changes with mild superimposed atelectasis versus infiltrate at lung bases especially on RIGHT. 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 Aortic Atherosclerosis (ICD10-I70.0) and Emphysema (ICD10-J43.9). Electronically Signed   By: Lavonia Dana M.D.    On: 05/04/2017 08:26   Dg Chest Port 1 View  Result Date: 05/05/2017 CLINICAL DATA:  Shortness of breath EXAM: PORTABLE CHEST 1 VIEW COMPARISON:  Yesterday FINDINGS: Normal heart size. Stable fullness of the hila, with branching pattern suggesting pulmonary artery enlargement, stable. Main pulmonary artery shadow is also enlarged, findings of pulmonary hypertension. Advanced emphysema. Interstitial coarsening is at baseline. There is no edema, consolidation, effusion, or pneumothorax. IMPRESSION: 1. Advanced emphysema without acute superimposed finding. 2. Suspect pulmonary hypertension. Electronically Signed   By: Monte Fantasia M.D.   On: 05/05/2017 07:45    Cardiac Studies   NA  Patient Profile     71 y.o. female with a hx of HTN, HLD, COPD, + tobacco use and anxiety who is being seen for the evaluation of abnormal EKG and abnormal cardiac enzymes in the face of acute exacerbation of chronic lung disease.   Assessment & Plan    ELEVATED CARDIAC ENZYMES:  Demand ischemia related to acute pulmonary process. Enzyme trend is flat.  I would continue heparin for today.  I have changed to bisoprolol.  We do plan an echo .    I would likely plan a cath after her lungs are much improved.    HYPERKALEMIA:  Treated and repeat labs pending.  Potassium discontinued.    Signed, Minus Breeding, MD  05/05/2017, 8:31 AM

## 2017-05-05 NOTE — Progress Notes (Signed)
OT Cancellation Note  Patient Details Name: Kayla Mack MRN: [id number] DOB: Jun 17, 1946   Cancelled Treatment:    Reason Eval/Treat Not Completed: Patient not medically ready. Pt with rapid response call this morning, now on bipap.  Malka So 05/05/2017, 8:04 AM  [phone]

## 2017-05-05 NOTE — Progress Notes (Signed)
CRITICAL VALUE ALERT  Critical Value:  Troponin 0.14  Date & Time Notied:  05/05/17/ 0857  Provider Notified: Dr Percival Spanish  Orders Received/Actions taken: no new orders at this time

## 2017-05-05 NOTE — Progress Notes (Signed)
Family Medicine Teaching Service Daily Progress Note Intern Pager: [phone]  Patient name: Kayla Mack Medical record number: [id number] Date of birth: 06-14-1946 Age: 71 y.o. Gender: female  Primary Care Provider: System, Provider Not In Consultants: Cardiology, Critical care Code Status: Full   Pt Overview and Major Events to Date:  Kayla Mack is a 71 y.o. Female presenting with SOB. Patient was admitted to [facility] on 05/04/2017.   Assessment and Plan: HARLYNN KIMBELL is a 71 y.o. female presenting with SOB. PMH is significant for anxiety, COPD, depression, hyperlipidemia, hypertension, tobacco abuse disorder.  SOB:  Patient was recently diagnosed with COPD about 2 months ago, has a history of 75 pack years. Developed a cold on Saturday, with progressively worsening cough, congestion, sputum production, and dyspnea. Desatted to 78% on room air with EMS evaluation. Noted to have a slightly elevated WBC 12.4 and possibly infiltrate of right lung base on CXR. Noted to have elevated JVD with CXR positive for pulmonary HTN likely contributing. Hx of HTN, tobacco abuse, and HLD, places patient at increase risk for cardiac. EKG on admission with Q-waves in lateral leads, and ST depression in v4-v6.Troponins trended at 0.08 > 0.07 > 0.05 > 0.14. Is currently on heparin. Mostly likely COPD with underlying Pneumonia diagnosis, EKG findings possibly demand ischemia as patient sinus tachycardic following breath treatment x 2 and CAT. Will however follow for ACS. Patient desaturated this morning to low 80s and was put on non-rebreathing due to increased work of breathing and decreased oxygen. Patient was given another dose of solumedrol by critical care and placed on bipap. Current O2 saturation of 98%. ABG showing pH 7.371m pCO2 46.0, pO2 51.0, and bicarbonate of 26.4. Patient states she is much improved after bipap. States She has shortness of breath but decreased since bipap. Patient reports  chest pressure but states this is due to increased work of breathing. Patient reports cough and white sputum production.  - Continuous pulse oximetry  - Blood cultures and sputum culture pending  - AM EKG  - Continue levaquin 750 mg for HCAP  - ECHO for pulmonary HTN  - low threshold to move to ICU - appreciate critical care recommendations  - d-dimer to r/o PE, consider CTA if elevated   Hyperkalemia K of 6.0 this morning. Was given insulin and D50 per critical care. K of 5.4 following insulin administration. Mg of 1.7. Have consulted cardiology. They recommend no kayexcelate at this time and no calcium gluconate due to no acute EKG changes at this time.  -appreciate cardiology recommendations   COPD  Appears more emphysema on physical exam due to patient body habitus, but no PFTs to confirm.  - Continue home Ellipta   Sinus Tachycardia:  Repeat EKG on admission with Q-waves in lateral leads, and ST depression in v4-v6, troponin 0.04. No hx of chest pain, diaphoresis, nausea/vomitting per patient - unlikely ACS. Mostly like sinus tachycardia is acting a stress test and patient likely has some blockages resulting in demand ischemia. Troponins trended at 0.08 > 0.07 > 0.05 > 0.14.  Cardiology has been consulted. K of 5.4, Mg of 1.7. Lipid panel wnl with slightly low HDL of 31. A1C of 5.8. TSH of 0.687. Cardiology has seen patient and states elevated cardiac enzymes due to demand ischemia related to acute pulmonary process. Recommended to continue heparin and change to bisoprolol. Have planned for echo and plan for cath after her lungs are improved.  - AM EKG - Echo - appreciate cardiology recommendations  Tobacco Abuse Disorder  75 pack year history  - start patch  - tobacco cessation teaching   HLD:  Home lovastatin. Lipid panel wnl with slightly low HDL of 31. -Continue pravastatin  HTN:  BP 98/56 - bisoprolol    Anxiety:  -Continue bupropion -Continue home klonopin 0.5  mg prn   FEN/GI: NPO PPx: Heparin   Disposition: continue to monitor, may need intensive care   Subjective:  Kayla Mack is a 71 y.o. female presenting with SOB. PMH is significant for anxiety, COPD, depression, hyperlipidemia, hypertension, tobacco abuse disorder. Patient today states she is improved after bipap. States shortness of breath, but improved after bipap. Patient notes chest pressure. Patient reports cough and white sputum production. Patient denies fever, but endorses chills. Patient denies dizziness or muscle pain/cramping. Patient states she had a dose of clonazepam which improved her anxiety.   Objective: Temp:  [97.6 F (36.4 C)-99.2 F (37.3 C)] 98 F (36.7 C) (07/19 0700) Pulse Rate:  [72-135] 72 (07/19 0851) Resp:  [17-33] 21 (07/19 0851) BP: (88-146)/(47-127) 98/56 (07/19 0851) SpO2:  [79 %-99 %] 99 % (07/19 0852) FiO2 (%):  [40 %] 40 % (07/19 0852) Weight:  [110 lb 10.7 oz (50.2 kg)] 110 lb 10.7 oz (50.2 kg) (07/19 0237) Physical Exam: General: awake and alert sitting up in bed  Cardiovascular: RRR, no MRG  Respiratory: course breath sounds, expiratory wheezes throughout, increased work of breathing, currently sating 98%  Abdomen: soft, non tender, non distended, bowel sounds x 4 quadrants  Extremities: no edema, full range of motion  Laboratory:  Recent Labs Lab 05/04/17 0744 05/04/17 1050 05/05/17 0053  WBC 12.4* 10.0 11.7*  HGB 14.6 13.8 12.8  HCT 45.0 41.9 39.8  PLT 270 239 236    Recent Labs Lab 05/04/17 1044 05/05/17 0053 05/05/17 0230 05/05/17 0958  NA 136 133*  --  134*  K 3.3* 5.8* 6.0* 5.4*  CL 105 105  --  105  CO2 19* 21*  --  22  BUN 7 10  --  15  CREATININE 0.83 0.71  --  0.77  CALCIUM 8.1* 8.2*  --  8.4*  GLUCOSE 213* 121*  --  118*   Arterial Blood Gas result:  pO2 51.0 ; pCO2 46.0 ; pH 7.367 ;  HCO3 26.4 , %O2 Sat 84.0.   Imaging/Diagnostic Tests: Dg Chest 2 View  Result Date: 05/04/2017 CLINICAL DATA:   Cough, shortness of breath, hypertension, COPD, smoker EXAM: CHEST  2 VIEW COMPARISON:  03/21/2017 FINDINGS: Normal heart size. Prominent central pulmonary arteries and hila question pulmonary arterial hypertension. Mediastinal contours otherwise normal. Atherosclerotic calcifications aorta. Emphysematous and bronchitic changes consistent with COPD. Bullous disease RIGHT upper lobe. Increased basilar markings versus previous study particularly on RIGHT cannot exclude superimposed infiltrate or atelectasis. Upper lungs clear. No pleural effusion or pneumothorax. Bones demineralized with chronic compression deformity of a lower thoracic vertebra unchanged. IMPRESSION: COPD changes with mild superimposed atelectasis versus infiltrate at lung bases especially on RIGHT. 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 Aortic Atherosclerosis (ICD10-I70.0) and Emphysema (ICD10-J43.9). Electronically Signed   By: Lavonia Dana M.D.   On: 05/04/2017 08:26   Dg Chest Port 1 View  Result Date: 05/05/2017 CLINICAL DATA:  Shortness of breath EXAM: PORTABLE CHEST 1 VIEW COMPARISON:  Yesterday FINDINGS: Normal heart size. Stable fullness of the hila, with branching pattern suggesting pulmonary artery enlargement, stable. Main pulmonary artery shadow is also enlarged, findings of pulmonary hypertension. Advanced emphysema. Interstitial coarsening is at baseline. There is  no edema, consolidation, effusion, or pneumothorax. IMPRESSION: 1. Advanced emphysema without acute superimposed finding. 2. Suspect pulmonary hypertension. Electronically Signed   By: Monte Fantasia M.D.   On: 05/05/2017 07:45    Caroline More, DO 05/05/2017, 11:35 AM PGY-1, [place] Intern pager: [phone], text pages welcome

## 2017-05-05 NOTE — Progress Notes (Signed)
[place] for heparin  Indication: chest pain/ACS  Allergies  Allergen Reactions  . Aleve [Naproxen Sodium] Hives  . Aspirin Swelling    Facial swelling  . Penicillins Hives and Swelling    Has patient had a PCN reaction causing immediate rash, facial/tongue/throat swelling, SOB or lightheadedness with hypotension: Yes Has patient had a PCN reaction causing severe rash involving mucus membranes or skin necrosis: Yes Has patient had a PCN reaction that required hospitalization: Unk Has patient had a PCN reaction occurring within the last 10 years: No If all of the above answers are "NO", then may proceed with Cephalosporin use.     Patient Measurements: Height: _0  (157.5 cm) Weight: 110 lb 10.7 oz (50.2 kg) IBW/kg (Calculated) : 50.1 Heparin Dosing Weight: 49  Vital Signs: Temp: 98 F (36.7 C) (07/19 1920) Temp Source: Oral (07/19 1920) BP: 113/66 (07/19 1920) Pulse Rate: 88 (07/19 1920)  Labs:  Recent Labs  05/04/17 0744 05/04/17 1044  05/04/17 1050 05/04/17 1641 05/04/17 2250 05/05/17 0053 05/05/17 0710 05/05/17 0958 05/05/17 1822  HGB 14.6  --   --  13.8  --   --  12.8  --   --   --   HCT 45.0  --   --  41.9  --   --  39.8  --   --   --   PLT 270  --   --  239  --   --  236  --   --   --   HEPARINUNFRC  --   --   --   --   --   --  0.24*  --  0.15* 0.42  CREATININE 0.68 0.83  --   --   --   --  0.71  --  0.77  --   TROPONINI  --   --   < > 0.08* 0.07* 0.05*  --  0.14*  --   --   < > = values in this interval not displayed.  Estimated Creatinine Clearance: 50.3 mL/min (by C-G formula based on SCr of 0.77 mg/dL).   Medical History: Past Medical History:  Diagnosis Date  . Anxiety   . COPD (chronic obstructive pulmonary disease) ([place])   . Depression   . Hyperlipidemia   . Hypertension    Assessment: 71 Y/O F on IV heparin for ACS. Heparin levels (0.42) therapeutic after rate increase this morning.  Goal of  Therapy:  Heparin level 0.3-0.7 units/ml Monitor platelets by anticoagulation protocol: Yes   Plan:  Continue heparin 850 units/hr F/u AM labs  Maryanna Shape, PharmD, BCPS  Clinical Pharmacist  Pager: [phone]   05/05/2017 7:29 PM

## 2017-05-05 NOTE — Progress Notes (Signed)
Initial Nutrition Assessment  DOCUMENTATION CODES:   Non-severe (moderate) malnutrition in context of chronic illness  INTERVENTION:    Ensure Enlive po BID, each supplement provides 350 kcal and 20 grams of protein  NUTRITION DIAGNOSIS:   Malnutrition (moderate) related to chronic illness (COPD) as evidenced by mild depletion of body fat, moderate depletion of body fat, mild depletion of muscle mass, moderate depletions of muscle mass, severe depletion of muscle mass.  GOAL:   Patient will meet greater than or equal to 90% of their needs  MONITOR:   PO intake, Supplement acceptance  REASON FOR ASSESSMENT:   Malnutrition Screening Tool    ASSESSMENT:   71 yo female with PMH of COPD, HTN, depression, HLD, anxiety who was admitted on 7/18 with worsening SOB related to acute COPD exacerbation.  Patient reports eating poorly for the past few weeks. She has progressively lost weight over the past several years. She was hungry today, so MD allowed a heart healthy diet while she is not on BiPAP.  Nutrition-Focused physical exam completed. Findings are mild-moderate fat depletion, mild-moderate-severe muscle depletion, and no edema.   Labs and medications reviewed.  Diet Order:  Diet Heart Room service appropriate? Yes; Fluid consistency: Thin  Skin:  Reviewed, no issues  Last BM:  7/18  Height:   Ht Readings from Last 1 Encounters:  05/04/17 _0  (1.575 m)    Weight:   Wt Readings from Last 1 Encounters:  05/05/17 110 lb 10.7 oz (50.2 kg)    Ideal Body Weight:  50 kg  BMI:  Body mass index is 20.24 kg/m.  Estimated Nutritional Needs:   Kcal:  1400-1600  Protein:  70-80 gm  Fluid:  1.5 L  EDUCATION NEEDS:   No education needs identified at this time  Molli Barrows, [place], [place], [place] Pager [phone] After Hours Pager [phone]

## 2017-05-05 NOTE — Progress Notes (Signed)
Saw Kayla Mack this afternoon.  She was working to breathe as she was sitting up talking to me, and she desatted to the mid-80s on 4L O2 via [place].  However, when she leaned back, her SpO2 increased to 94%.  She said that Bipap was very helpful for her, and I encouraged her to use it again if she needs it.  She also said that she has not eaten since she had a small bowl of soup on Tuesday and that she was very hungry.  I told her that there was a risk for aspiration if she eats, and she expressed understanding, but wished to try some food anyway.   On exam, she had suprasternal and intercostal retractions as well as significant inspiratory and expiratory wheezes.  We will give her some food but switch her to NPO when she needs Bipap again.

## 2017-05-05 NOTE — Progress Notes (Signed)
  Echocardiogram 2D Echocardiogram has been performed.  Wash Nienhaus 05/05/2017, 10:45 AM

## 2017-05-05 NOTE — Care Management Note (Signed)
Case Management Note  Patient Details  Name: Kayla Mack MRN: [id number] Date of Birth: 29-Mar-1946  Subjective/Objective:    Pt admitted with SOB                Action/Plan:   PTA independent from home.  Pt required rapid response overnight now on SD   Expected Discharge Date:                  Expected Discharge Plan:     In-House Referral:     Discharge planning Services  CM Consult  Post Acute Care Choice:    Choice offered to:     DME Arranged:    DME Agency:     HH Arranged:    HH Agency:     Status of Service:     If discussed at [employer], dates discussed:    Additional Comments:  Maryclare Labrador, RN 05/05/2017, 3:09 PM

## 2017-05-05 NOTE — Progress Notes (Signed)
Family Medicine Progress Note  Saw and evaluated patient. Satting well overnight until approximately 545 when she went to use bedside commode. Desatted down to 80s. Placed on non-rebreather with sats returning to high 90s. Persistently increased work of breathing. CCM following patient arrived at that time. Gave 131m solumedrol, 226mmorphine, and placed on BiPAP. EKG obtained which showed nsr. Chest xray which showed hazy consolidation Left middle lung and RLL. Patient with hyperkalemia overnight up to 6.0. No peaked twaves on ekg. CCM gave 10 u insulin and 1 amp d50. Appreciate their help, will continue to follow closely this am.  JaDeirdre PeerD PGY-1 [facility]

## 2017-05-05 NOTE — Progress Notes (Signed)
Called to bedside for increased WOB.  Pt sitting upright in bed, tachypneic, accessory muscle use, shallow respirations, complaining of SOB.  Expiratory wheezing and rhonchi present on auscultation, breathing treatment in progress on my arrival.  HR 91, O2 sats 98% on 12L face mask during breathing treatment.  BP 131/113.  NSR on monitor.    Rahul, PA at bedside.  PCXR, ABG, EKG ordered. Solu medrol 125 mg and morphine 2 mg given per verbal order.  Respiratory therapist placed pt on Bipap with improvement in work of breathing.    Pt to remain on 4E on bipap, will monitor closely, pt placed on our radar.

## 2017-05-05 NOTE — Progress Notes (Signed)
PT Cancellation Note  Patient Details Name: Kayla Mack MRN: [id number] DOB: Jun 12, 1946   Cancelled Treatment:    Reason Eval/Treat Not Completed: (P) Patient not medically ready. Pt with rapid response and placed on BiPAP. Will check back tomorrow.   Lemario Chaikin B. Migdalia Dk PT, DPT Acute Rehabilitation  [phone] Pager [phone]  [place] 05/05/2017, 8:06 AM

## 2017-05-05 NOTE — Progress Notes (Addendum)
[place] for heparin  Indication: chest pain/ACS  Allergies  Allergen Reactions  . Aleve [Naproxen Sodium] Hives  . Aspirin Swelling    Facial swelling  . Penicillins Hives and Swelling    Has patient had a PCN reaction causing immediate rash, facial/tongue/throat swelling, SOB or lightheadedness with hypotension: Yes Has patient had a PCN reaction causing severe rash involving mucus membranes or skin necrosis: Yes Has patient had a PCN reaction that required hospitalization: Unk Has patient had a PCN reaction occurring within the last 10 years: No If all of the above answers are "NO", then may proceed with Cephalosporin use.     Patient Measurements: Height: 5' 2" (157.5 cm) Weight: 110 lb 10.7 oz (50.2 kg) IBW/kg (Calculated) : 50.1 Heparin Dosing Weight: 49  Vital Signs: Temp: 98 F (36.7 C) (07/19 0700) Temp Source: Axillary (07/19 0700) BP: 98/56 (07/19 0851) Pulse Rate: 72 (07/19 0851)  Labs:  Recent Labs  05/04/17 0744 05/04/17 1044  05/04/17 1050 05/04/17 1641 05/04/17 2250 05/05/17 0053 05/05/17 0710 05/05/17 0958  HGB 14.6  --   --  13.8  --   --  12.8  --   --   HCT 45.0  --   --  41.9  --   --  39.8  --   --   PLT 270  --   --  239  --   --  236  --   --   HEPARINUNFRC  --   --   --   --   --   --  0.24*  --  0.15*  CREATININE 0.68 0.83  --   --   --   --  0.71  --  0.77  TROPONINI  --   --   < > 0.08* 0.07* 0.05*  --  0.14*  --   < > = values in this interval not displayed.  Estimated Creatinine Clearance: 50.3 mL/min (by C-G formula based on SCr of 0.77 mg/dL).   Medical History: Past Medical History:  Diagnosis Date  . Anxiety   . COPD (chronic obstructive pulmonary disease) ([place])   . Depression   . Hyperlipidemia   . Hypertension    Assessment: 71 Y/O F with PMX of COPD, Anxiety, HTN, HLD presented with hx of SOB worsening over the last three days. ECG changes noted, heparin ordered for possible  ACS. Heparin levels have been subtherapeutic despite rate increases. There have been no issues with the infusion or bleeding per nurse. CBC is stable.   Goal of Therapy:  Heparin level 0.3-0.7 units/ml Monitor platelets by anticoagulation protocol: Yes   Plan:  Give Heparin 1500 unit bolus Increase heparin rate to 850 units/hr 6 hour heparin level Daily Heparin Level and CBC Monitor s/sx of bleeding  Dierdre Harness, PharmD Clinical Pharmacist [phone] (Pager) 05/05/2017 11:50 AM

## 2017-05-05 NOTE — Progress Notes (Signed)
eLink Physician-Brief Progress Note Patient Name: Kayla Mack DOB: 04/26/1946 MRN: [id number]   Date of Service  05/05/2017  HPI/Events of Note  Nurse asked for help, patient with acute resp distress h/o COPD, RT at bedside to place biPAP  eICU Interventions  PCCM consult for COPD exacerbation Place biPAP IV steroids BD therapy Morphine as needed     Intervention Category Evaluation Type: New Patient Evaluation  Kayla Mack 05/05/2017, 6:01 AM

## 2017-05-05 NOTE — Progress Notes (Signed)
Family Medicine Progress Note  Saw and examined patient with Dr. Yisroel Ramming. Patient doing much better than when I last saw her this morning. Not requiring accessory muscle use on 4L [place]. Satting in [place]. Patient tolerated po. Is aware that she cannot go back on BiPap if she needs unless she has been npo for a long as possible. Some scattered wheezes bilateral lungs. Will get next breathing treatment soon.  Guadalupe Dawn MD PGY-1 Family Medicine Resident

## 2017-05-06 ENCOUNTER — Inpatient Hospital Stay (HOSPITAL_COMMUNITY): Payer: Medicare Other

## 2017-05-06 DIAGNOSIS — I272 Pulmonary hypertension, unspecified: Secondary | ICD-10-CM

## 2017-05-06 DIAGNOSIS — J439 Emphysema, unspecified: Secondary | ICD-10-CM

## 2017-05-06 DIAGNOSIS — R0602 Shortness of breath: Secondary | ICD-10-CM

## 2017-05-06 LAB — BASIC METABOLIC PANEL
Anion gap: 7 (ref 5–15)
BUN: 13 mg/dL (ref 6–20)
CO2: 26 mmol/L (ref 22–32)
Calcium: 8.7 mg/dL — ABNORMAL LOW (ref 8.9–10.3)
Chloride: 102 mmol/L (ref 101–111)
Creatinine, Ser: 0.63 mg/dL (ref 0.44–1.00)
GFR calc Af Amer: >60 mL/min (ref >60)
GFR calc non Af Amer: >60 mL/min (ref >60)
Glucose, Bld: 141 mg/dL — ABNORMAL HIGH (ref 65–99)
Potassium: 4.9 mmol/L (ref 3.5–5.1)
Sodium: 135 mmol/L (ref 135–145)

## 2017-05-06 LAB — CBC
HCT: 42.9 % (ref 36.0–46.0)
Hemoglobin: 13.5 g/dL (ref 12.0–15.0)
MCH: 31.4 pg (ref 26.0–34.0)
MCHC: 31.5 g/dL (ref 30.0–36.0)
MCV: 99.8 fL (ref 78.0–100.0)
Platelets: 256 10*3/uL (ref 150–400)
RBC: 4.3 MIL/uL (ref 3.87–5.11)
RDW: 14.3 % (ref 11.5–15.5)
WBC: 15.8 10*3/uL — ABNORMAL HIGH (ref 4.0–10.5)

## 2017-05-06 LAB — GLUCOSE, CAPILLARY: Glucose-Capillary: 148 mg/dL — ABNORMAL HIGH (ref 65–99)

## 2017-05-06 LAB — HEPARIN LEVEL (UNFRACTIONATED): Heparin Unfractionated: 0.33 IU/mL (ref 0.30–0.70)

## 2017-05-06 MED ORDER — SODIUM CHLORIDE 0.9 % WEIGHT BASED INFUSION: 1.0000 mL/kg/h | INTRAVENOUS | Status: DC

## 2017-05-06 MED ORDER — SODIUM CHLORIDE 0.9% FLUSH: 3.0000 mL | Freq: Two times a day (BID) | INTRAVENOUS | Status: DC

## 2017-05-06 MED ORDER — SODIUM CHLORIDE 0.9% FLUSH: 3.0000 mL | INTRAVENOUS | Status: DC | PRN

## 2017-05-06 MED ORDER — LEVALBUTEROL HCL 0.63 MG/3ML IN NEBU
0.6300 mg | INHALATION_SOLUTION | Freq: Four times a day (QID) | RESPIRATORY_TRACT | Status: DC
  Administered 2017-05-06 – 2017-05-08 (×8): 0.63 mg via RESPIRATORY_TRACT
  Filled 2017-05-06 (×17): qty 3

## 2017-05-06 MED ORDER — SODIUM CHLORIDE 0.9 % WEIGHT BASED INFUSION: 3.0000 mL/kg/h | INTRAVENOUS | Status: DC

## 2017-05-06 MED ORDER — SODIUM CHLORIDE 0.9 % IV SOLN
250.0000 mL | INTRAVENOUS | Status: DC | PRN
  Administered 2017-05-06: 250 mL via INTRAVENOUS

## 2017-05-06 MED ORDER — ASPIRIN 81 MG PO CHEW: 81.0000 mg | CHEWABLE_TABLET | ORAL | Status: DC

## 2017-05-06 NOTE — Progress Notes (Signed)
Pt tx tp 23M per MD order, pt tol well, pt statble at [facility], report called to receiving RN, all questions answered,

## 2017-05-06 NOTE — Evaluation (Signed)
Occupational Therapy Evaluation Patient Details Name: Kayla Mack MRN: [id number] DOB: December 01, 1945 Today's Date: 05/06/2017    History of Present Illness 71 y.o. female with a hx of HTN, HLD, COPD, + tobacco use and anxiety who is being seen for the evaluation of abnormal EKG and abnormal cardiac enzymes in the face of acute exacerbation of chronic lung disease.    Clinical Impression   Pt was independent prior to admission and the caregiver of her mother. She presents with generalized weakness, decreased activity tolerance and poor standing balance. She requires min assist for mobility with a RW and ADL. Will follow acutely.    Follow Up Recommendations  SNF (may progress to [facility])    Equipment Recommendations  None recommended by OT    Recommendations for Other Services       Precautions / Restrictions Precautions Precautions: Fall Restrictions Weight Bearing Restrictions: No      Mobility Bed Mobility Overal bed mobility: Independent             General bed mobility comments: Pt dyspnea at rest 3/4 just with talking.  Incr WOB at rest.  Transfers Overall transfer level: Needs assistance Equipment used: Rolling walker (2 wheeled) Transfers: Sit to/from Stand Sit to Stand: Supervision         General transfer comment: supervision for safety, lines    Balance Overall balance assessment: Needs assistance Sitting-balance support: No upper extremity supported;Feet supported Sitting balance-Leahy Scale: Fair     Standing balance support: Bilateral upper extremity supported;During functional activity Standing balance-Leahy Scale: Poor Standing balance comment: relies on RW for UE support.  Cannot stand statically wihtout support as she is unsteady on feet.                            ADL either performed or assessed with clinical judgement   ADL Overall ADL's : Needs assistance/impaired Eating/Feeding: Independent;Sitting   Grooming:  Wash/dry hands;Wash/dry face;Sitting;Set up   Upper Body Bathing: Minimal assistance;Sitting   Lower Body Bathing: Minimal assistance;Sit to/from stand   Upper Body Dressing : Set up;Sitting   Lower Body Dressing: Minimal assistance;Sit to/from stand   Toilet Transfer: Minimal assistance;Ambulation;RW;BSC   Toileting- Water quality scientist and Hygiene: Min guard;Sit to/from stand       Functional mobility during ADLs: Minimal assistance;Rolling walker General ADL Comments: Pt limited by poor activity tolerance and impaired standing balance.     Vision Baseline Vision/History: Wears glasses Wears Glasses: Reading only Patient Visual Report: No change from baseline       Perception     Praxis      Pertinent Vitals/Pain Pain Assessment: No/denies pain     Hand Dominance Right   Extremity/Trunk Assessment Upper Extremity Assessment Upper Extremity Assessment: Overall WFL for tasks assessed   Lower Extremity Assessment Lower Extremity Assessment: Defer to PT evaluation   Cervical / Trunk Assessment Cervical / Trunk Assessment: Kyphotic   Communication Communication Communication: No difficulties   Cognition Arousal/Alertness: Awake/alert Behavior During Therapy: WFL for tasks assessed/performed Overall Cognitive Status: Within Functional Limits for tasks assessed                                     General Comments       Exercises Exercises: General Lower Extremity General Exercises - Lower Extremity Ankle Circles/Pumps: AROM;Both;10 reps;Supine Quad Sets: AROM;Both;5 reps;Supine Long Arc Quad: AROM;Both;5 reps;Seated  Shoulder Instructions      Home Living Family/patient expects to be discharged to:: Private residence Living Arrangements: Alone Available Help at Discharge: Friend(s);Available PRN/intermittently (takes care of mom) Type of Home: House Home Access: Ramped entrance     Home Layout: One level     Bathroom  Shower/Tub: Teacher, early years/pre: Standard     Home Equipment: Environmental consultant - 4 wheels;Tub bench   Additional Comments: caregiver for mother - mom currently in hospital and has 3 steps to get into her home.       Prior Functioning/Environment Level of Independence: Independent        Comments: Drove PTA        OT Problem List: Decreased activity tolerance;Impaired balance (sitting and/or standing);Cardiopulmonary status limiting activity;Decreased knowledge of use of DME or AE      OT Treatment/Interventions: Self-care/ADL training;DME and/or AE instruction;Therapeutic activities;Patient/family education;Energy conservation    OT Goals(Current goals can be found in the care plan section) Acute Rehab OT Goals Patient Stated Goal: to go home OT Goal Formulation: With patient Time For Goal Achievement: 05/13/17 Potential to Achieve Goals: Good ADL Goals Pt Will Perform Grooming: with modified independence;standing Pt Will Perform Lower Body Bathing: with modified independence;sit to/from stand Pt Will Perform Lower Body Dressing: with modified independence;sit to/from stand Pt Will Transfer to Toilet: with modified independence;ambulating;regular height toilet Additional ADL Goal #1: Pt will gather items necessary for ADL modified independently. Additional ADL Goal #2: Pt will state at least 3 energy conservation strategies as instructed.  OT Frequency: Min 2X/week   Barriers to D/C: Decreased caregiver support          Co-evaluation   Reason for Co-Treatment: For patient/therapist safety PT goals addressed during session: Mobility/safety with mobility        AM-PAC PT "6 Clicks" Daily Activity     Outcome Measure Help from another person eating meals?: None Help from another person taking care of personal grooming?: A Little Help from another person toileting, which includes using toliet, bedpan, or urinal?: A Little Help from another person bathing  (including washing, rinsing, drying)?: A Little Help from another person to put on and taking off regular upper body clothing?: None Help from another person to put on and taking off regular lower body clothing?: A Little 6 Click Score: 20   End of Session Equipment Utilized During Treatment: Gait belt;Rolling walker;Oxygen (6L for ambulation) Nurse Communication: Other (comment) (aware of IV leaking)  Activity Tolerance: Patient limited by fatigue Patient left: in chair;with call bell/phone within reach  OT Visit Diagnosis: Unsteadiness on feet (R26.81);Muscle weakness (generalized) (M62.81);Other abnormalities of gait and mobility (R26.89);Other (comment) (poor activity tolerance)                Time: 8413-2440 OT Time Calculation (min): 30 min Charges:  OT General Charges $OT Visit: 1 Procedure OT Evaluation $OT Eval Moderate Complexity: 1 Procedure G-Codes:     Malka So 05/06/2017, 10:25 AM  [phone]

## 2017-05-06 NOTE — Progress Notes (Signed)
Per CCM note from today, patient has been transferred to [facility] service. However, FMTS attending shows as primary in epic. Paged and talked to CCM on call, Dr. Glean Hess, to clarify this. Dr. deterring confirms that CCM is primary and she will have her staff member change the attendings name.  Bretta Bang

## 2017-05-06 NOTE — Progress Notes (Signed)
SATURATION QUALIFICATIONS: (This note is used to comply with regulatory documentation for home oxygen)  Patient Saturations on Room Air at Rest = Unable to assess as pt desats to 87-89% with talking on 4LO2  Patient Saturations on Room Air while Ambulating = Unable to assess due to above  Patient Saturations on 6 Liters of oxygen while Ambulating = 89-94% with pursed lip breathing and cues for pt not to talk.  Tried pt on 4L with ambulation and desat to 85-89%.    Please briefly explain why patient needs home oxygen:May need home O2 as pt desats at rest and with activity.  Dyspnea at rest 3/4.   Thanks.  [place] [phone] (pager)

## 2017-05-06 NOTE — Evaluation (Signed)
Physical Therapy Evaluation Patient Details Name: Kayla Mack MRN: [id number] DOB: [birth year] Today's Date: 05/06/2017   History of Present Illness  71 y.o. female with a hx of HTN, HLD, COPD, + tobacco use and anxiety who is being seen for the evaluation of abnormal EKG and abnormal cardiac enzymes in the face of acute exacerbation of chronic lung disease.   Clinical Impression  Pt admitted with above diagnosis. Pt currently with functional limitations due to the deficits listed below (see PT Problem List). Pt was able to ambulate with RW but with DOE 4/4 after only 15 feet of ambulation with sats dropping even with pursed lip breathing.  Pt with very poor endurance and reliant on O2 currently.  Takes care of mother as well who is also in hospital currently.  Discussed pt moving her mother in with her as she has a ramp at her house.  Pt can use mom's rollator as mom uses electric wheelchair.  Feel that a SNF stay would benefit pt to improve endurance prior to d/c home.  Will follow acutely.   Pt will benefit from skilled PT to increase their independence and safety with mobility to allow discharge to the venue listed below.    SATURATION QUALIFICATIONS: (This note is used to comply with regulatory documentation for home oxygen)  Patient Saturations on Room Air at Rest = Unable to assess as pt desats to 87-89% with talking on 4LO2  Patient Saturations on Room Air while Ambulating = Unable to assess due to above  Patient Saturations on 6 Liters of oxygen while Ambulating = 89-94% with pursed lip breathing and cues for pt not to talk.  Tried pt on 4L with ambulation and desat to 85-89%.    Please briefly explain why patient needs home oxygen:May need home O2 as pt desats at rest and with activity.  Dyspnea at rest 3/4.  Other VSS.    Follow Up Recommendations SNF;Supervision/Assistance - 24 hour    Equipment Recommendations  Other (comment) (TBA)    Recommendations for Other  Services       Precautions / Restrictions Precautions Precautions: Fall Restrictions Weight Bearing Restrictions: No      Mobility  Bed Mobility Overal bed mobility: Independent             General bed mobility comments: Pt dyspnea at rest 3/4 just with talking.  Incr WOB at rest.  Transfers Overall transfer level: Independent                  Ambulation/Gait Ambulation/Gait assistance: Min guard;Min assist;+2 safety/equipment Ambulation Distance (Feet): 25 Feet Assistive device: Rolling walker (2 wheeled) Gait Pattern/deviations: Step-through pattern;Decreased stride length;Trunk flexed;Wide base of support;Drifts right/left;Shuffle   Gait velocity interpretation: Below normal speed for age/gender General Gait Details: Pt needed alot of cues to use RW correctly and stay inside RW. Pt needed constant cues for breathing techniques as DOE 4/4 with a short walk.  Pt was able to ambulate but limited distance as desats with only 15 feetof ambulation down to 88% on 6LO2.  Once back to room and sititng still dyspnea at rest 3/4 and sats on 4L 93-94%.    Stairs            Wheelchair Mobility    Modified Rankin (Stroke Patients Only)       Balance Overall balance assessment: Needs assistance Sitting-balance support: No upper extremity supported;Feet supported Sitting balance-Leahy Scale: Fair     Standing balance support: Bilateral upper extremity supported;During  functional activity Standing balance-Leahy Scale: Poor Standing balance comment: relies on RW for UE support.  Cannot stand statically wihtout support as she is unsteady on feet.                              Pertinent Vitals/Pain Pain Assessment: No/denies pain    Home Living Family/patient expects to be discharged to:: Private residence Living Arrangements: Alone Available Help at Discharge: Friend(s);Available PRN/intermittently (Takes care of mom - shopping ) Type of Home:  House Home Access: Ramped entrance     Home Layout: One level Home Equipment: [place] - 4 wheels;Tub bench Additional Comments: caregiver for mother - mom currently in hospital and has 3 steps to get into her home.     Prior Function Level of Independence: Independent         Comments: Drove PTA     Hand Dominance   Dominant Hand: Right    Extremity/Trunk Assessment   Upper Extremity Assessment Upper Extremity Assessment: Defer to OT evaluation    Lower Extremity Assessment Lower Extremity Assessment: Generalized weakness    Cervical / Trunk Assessment Cervical / Trunk Assessment: Kyphotic  Communication   Communication: No difficulties  Cognition Arousal/Alertness: Awake/alert Behavior During Therapy: WFL for tasks assessed/performed Overall Cognitive Status: Within Functional Limits for tasks assessed                                        General Comments      Exercises General Exercises - Lower Extremity Ankle Circles/Pumps: AROM;Both;10 reps;Supine Quad Sets: AROM;Both;5 reps;Supine Long Arc Quad: AROM;Both;5 reps;Seated   Assessment/Plan    PT Assessment Patient needs continued PT services  PT Problem List Decreased activity tolerance;Decreased balance;Decreased mobility;Decreased knowledge of use of DME;Decreased safety awareness;Cardiopulmonary status limiting activity;Decreased knowledge of precautions       PT Treatment Interventions DME instruction;Gait training;Functional mobility training;Therapeutic activities;Therapeutic exercise;Balance training;Patient/family education    PT Goals (Current goals can be found in the Care Plan section)  Acute Rehab PT Goals Patient Stated Goal: to go home PT Goal Formulation: With patient Time For Goal Achievement: 05/20/17 Potential to Achieve Goals: Good    Frequency Min 3X/week   Barriers to discharge Decreased caregiver support pt cares for her mother    Co-evaluation PT/OT/SLP  Co-Evaluation/Treatment: Yes Reason for Co-Treatment: For patient/therapist safety PT goals addressed during session: Mobility/safety with mobility         AM-PAC PT "6 Clicks" Daily Activity  Outcome Measure Difficulty turning over in bed (including adjusting bedclothes, sheets and blankets)?: None Difficulty moving from lying on back to sitting on the side of the bed? : None Difficulty sitting down on and standing up from a chair with arms (e.g., wheelchair, bedside commode, etc,.)?: A Little Help needed moving to and from a bed to chair (including a wheelchair)?: A Little Help needed walking in hospital room?: A Lot Help needed climbing 3-5 steps with a railing? : Total 6 Click Score: 17    End of Session Equipment Utilized During Treatment: Gait belt;Oxygen Activity Tolerance: Patient limited by fatigue Patient left: in chair;with call bell/phone within reach Nurse Communication: Mobility status PT Visit Diagnosis: Muscle weakness (generalized) (M62.81);Other (comment);Difficulty in walking, not elsewhere classified (R26.2) (Decr endurance)    Time: 2023-3435 PT Time Calculation (min) (ACUTE ONLY): 25 min   Charges:   PT Evaluation $  PT Eval Moderate Complexity: 1 Procedure     PT G Codes:        Arnaldo Heffron,PT Acute Rehabilitation [phone] (pager)   Denice Paradise 05/06/2017, 9:57 AM

## 2017-05-06 NOTE — Consult Note (Signed)
Name: [name] MRN: [id number] DOB: Mar 21, 1946    ADMISSION DATE:  05/04/2017 CONSULTATION DATE:  05/04/17  REFERRING MD :  Gwendlyn Deutscher  CHIEF COMPLAINT:  SOB   HISTORY OF PRESENT ILLNESS:  Kayla Mack is a 71 y.o. female with a PMH as outlined below including but not limited to COPD.  She was admitted 7/18 with SOB x 3 days along with cough, presumed due to AECOPD.  She has a long standing smoking history, roughly 75 pack years. She was admitted and was started on levaquin and BD's.  During early AM hours 7/19, PCCM was called by nursing staff for assistance due to increased WOB.  Pt has apparently had increased WOB throughout the night and now has worsening wheezing.  She was on bedside commode earlier and desaturated into the high 80's.  She was then placed on VM and had improvement in sats to high 90's.  Currently sats 98% but has increased WOB along with significant wheeze. Has not received any steroids since admission.  Denies chest pain, lightheadedness.  SUBJECTIVE:  Worsening WOB, accessory , desat  VITAL SIGNS: Temp:  [97.6 F (36.4 C)-99 F (37.2 C)] 97.8 F (36.6 C) (07/20 0729) Pulse Rate:  [73-98] 81 (07/20 0800) Resp:  [13-28] 16 (07/20 0800) BP: (100-122)/(51-80) 122/77 (07/20 0800) SpO2:  [93 %-100 %] 97 % (07/20 0800) FiO2 (%):  [40 %] 40 % (07/20 0129)  PHYSICAL EXAMINATION: General:moderate distress Neuro: awake, talking sentence fragments HEENT: jvd  evdient PULM: poor air entry, wheezing bilateral, moderate CV: s1 s2 RRT distant BL:TJQZ, bs wnl, no r Extremities: no edema     Recent Labs Lab 05/05/17 0053 05/05/17 0230 05/05/17 0958 05/06/17 0653  NA 133*  --  134* 135  K 5.8* 6.0* 5.4* 4.9  CL 105  --  105 102  CO2 21*  --  22 26  BUN 10  --  15 13  CREATININE 0.71  --  0.77 0.63  GLUCOSE 121*  --  118* 141*    Recent Labs Lab 05/04/17 1050 05/05/17 0053 05/06/17 0418  HGB 13.8 12.8 13.5  HCT 41.9 39.8 42.9  WBC 10.0  11.7* 15.8*  PLT 239 236 256   Dg Chest Port 1 View  Result Date: 05/05/2017 CLINICAL DATA:  Shortness of breath EXAM: PORTABLE CHEST 1 VIEW COMPARISON:  Yesterday FINDINGS: Normal heart size. Stable fullness of the hila, with branching pattern suggesting pulmonary artery enlargement, stable. Main pulmonary artery shadow is also enlarged, findings of pulmonary hypertension. Advanced emphysema. Interstitial coarsening is at baseline. There is no edema, consolidation, effusion, or pneumothorax. IMPRESSION: 1. Advanced emphysema without acute superimposed finding. 2. Suspect pulmonary hypertension. Electronically Signed   By: Monte Fantasia M.D.   On: 05/05/2017 07:45    STUDIES:  CXR 7/18 > COPD changes.  SIGNIFICANT EVENTS  7/18 > admit. 7/19 > PCCM consult.  ASSESSMENT / PLAN:  Acute hypoxic respiratory failure - due to AECOPD. PNA likely left greater rt AECOPD - apparently told she has COPD just 2 months ago; however, no PFT's available in system. Tobacco dependence. Plan: She is worsening now, add scheduled NIMV 4 hours on 2 hours off and move to icu as full code and she is reasonably functiona at home May need CT chest to define infiltrates NPO Bders amx Steroids likley with her body wt have maxed dose pcxr in am  Keep levofloxacin, if declines add ceftaz, vanc pcxr now and in am  If neuro declines assess abg  Elevated troponin - suspect demand ischemia.  Has been started on heparin for ACS. Plan: Echo Trop noted Per cards Cath pre dc likely  Hx HTN, HLD. Plan: Continue atenolol, pravastatin.  Anxiety. Plan: Continue bupropion, klonopin. May have max dose steroids for her body wt May need versed add on nimv Ccm time 35 min  I updated pt in full To icu, pccm service  Lavon Paganini. Titus Mould, MD, [place] Pgr: [facility] '

## 2017-05-06 NOTE — Progress Notes (Signed)
Pt taken off bipap per MD order (4 on 2 off) and placed on 3L [place]. Pt in no distress, VS within normal limits.  RN aware. RT will continue to monitor.

## 2017-05-06 NOTE — Progress Notes (Signed)
Progress Note  Patient Name: Kayla Mack Date of Encounter: 05/06/2017  Primary Cardiologist:   New (Dr. Percival Spanish)  Subjective   Breathing better.  Short runs of atrial tach.    Inpatient Medications    Scheduled Meds: . arformoterol  15 mcg Nebulization BID  . bisoprolol  5 mg Oral Daily  . budesonide (PULMICORT) nebulizer solution  0.5 mg Nebulization BID  . buPROPion  150 mg Oral Daily  . feeding supplement (ENSURE ENLIVE)  237 mL Oral BID BM  . ipratropium  0.5 mg Nebulization Q6H  . methylPREDNISolone (SOLU-MEDROL) injection  60 mg Intravenous Q6H  . nicotine  21 mg Transdermal Daily  . PARoxetine  20 mg Oral Daily  . pravastatin  20 mg Oral q1800   Continuous Infusions: . heparin 850 Units/hr (05/06/17 0300)  . levofloxacin (LEVAQUIN) IV     PRN Meds: acetaminophen **OR** acetaminophen, clonazePAM, levalbuterol   Vital Signs    Vitals:   05/05/17 2337 05/06/17 0129 05/06/17 0300 05/06/17 0346  BP:    104/62  Pulse: 98  85 73  Resp: _0 Temp:    97.6 F (36.4 C)  TempSrc:    Axillary  SpO2: 94% 99% 97% 100%  Weight:      Height:        Intake/Output Summary (Last 24 hours) at 05/06/17 0728 Last data filed at 05/06/17 0445  Gross per 24 hour  Intake            378.7 ml  Output             1050 ml  Net           -671.3 ml   Filed Weights   05/04/17 0735 05/05/17 0237  Weight: 108 lb (49 kg) 110 lb 10.7 oz (50.2 kg)    Telemetry    NSR - Personally Reviewed  ECG    NA   05/06/2017 - Personally Reviewed  Physical Exam   GEN: No  acute distress.   Neck: No  JVD Cardiac: RRR, no murmurs, rubs, or gallops.  Respiratory:   Decreased breath sounds with diffuse wheezing GI: Soft, nontender, non-distended, normal bowel sounds  MS:  No edema; No deformity. Neuro:   Nonfocal  Psych: Oriented and appropriate     Labs    Chemistry  Recent Labs Lab 05/04/17 1044 05/05/17 0053 05/05/17 0230 05/05/17 0958  NA 136 133*  --   134*  K 3.3* 5.8* 6.0* 5.4*  CL 105 105  --  105  CO2 19* 21*  --  22  GLUCOSE 213* 121*  --  118*  BUN 7 10  --  15  CREATININE 0.83 0.71  --  0.77  CALCIUM 8.1* 8.2*  --  8.4*  GFRNONAA >60 >60  --  >60  GFRAA >60 >60  --  >60  ANIONGAP 12 7  --  7     Hematology  Recent Labs Lab 05/04/17 1050 05/05/17 0053 05/06/17 0418  WBC 10.0 11.7* 15.8*  RBC 4.28 4.11 4.30  HGB 13.8 12.8 13.5  HCT 41.9 39.8 42.9  MCV 97.9 96.8 99.8  MCH 32.2 31.1 31.4  MCHC 32.9 32.2 31.5  RDW 14.5 14.3 14.3  PLT 239 236 256    Cardiac Enzymes  Recent Labs Lab 05/04/17 1050 05/04/17 1641 05/04/17 2250 05/05/17 0710  TROPONINI 0.08* 0.07* 0.05* 0.14*     Recent Labs Lab 05/04/17 0806  TROPIPOC 0.04  BNPNo results for input(s): BNP, PROBNP in the last 168 hours.   DDimer   Recent Labs Lab 05/05/17 1054  DDIMER 0.39     Radiology    Dg Chest 2 View  Result Date: 05/04/2017 CLINICAL DATA:  Cough, shortness of breath, hypertension, COPD, smoker EXAM: CHEST  2 VIEW COMPARISON:  03/21/2017 FINDINGS: Normal heart size. Prominent central pulmonary arteries and hila question pulmonary arterial hypertension. Mediastinal contours otherwise normal. Atherosclerotic calcifications aorta. Emphysematous and bronchitic changes consistent with COPD. Bullous disease RIGHT upper lobe. Increased basilar markings versus previous study particularly on RIGHT cannot exclude superimposed infiltrate or atelectasis. Upper lungs clear. No pleural effusion or pneumothorax. Bones demineralized with chronic compression deformity of a lower thoracic vertebra unchanged. IMPRESSION: COPD changes with mild superimposed atelectasis versus infiltrate at lung bases especially on RIGHT. 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 Aortic Atherosclerosis (ICD10-I70.0) and Emphysema (ICD10-J43.9). Electronically Signed   By: Lavonia Dana M.D.   On: 05/04/2017 08:26   Dg Chest Port 1 View  Result Date: 05/05/2017 CLINICAL  DATA:  Shortness of breath EXAM: PORTABLE CHEST 1 VIEW COMPARISON:  Yesterday FINDINGS: Normal heart size. Stable fullness of the hila, with branching pattern suggesting pulmonary artery enlargement, stable. Main pulmonary artery shadow is also enlarged, findings of pulmonary hypertension. Advanced emphysema. Interstitial coarsening is at baseline. There is no edema, consolidation, effusion, or pneumothorax. IMPRESSION: 1. Advanced emphysema without acute superimposed finding. 2. Suspect pulmonary hypertension. Electronically Signed   By: Monte Fantasia M.D.   On: 05/05/2017 07:45    Cardiac Studies   Echo:  - Left ventricle: The cavity size was normal. There was mild   concentric hypertrophy. Systolic function was normal. The   estimated ejection fraction was in the range of 60% to 65%. Wall   motion was normal; there were no regional wall motion   abnormalities. - Aortic valve: Transvalvular velocity was within the normal range.   There was no stenosis. There was no regurgitation. - Mitral valve: Transvalvular velocity was within the normal range.   There was no evidence for stenosis. There was mild regurgitation. - Right ventricle: The cavity size was normal. Wall thickness was   normal. Systolic function was normal. - Tricuspid valve: There was trivial regurgitation. - Pulmonary arteries: Systolic pressure was severely increased. PA   peak pressure: 63 mm Hg (S). - Pericardium, extracardiac: Minimal ascites was noted.  Patient Profile     71 y.o. female with a hx of HTN, HLD, COPD, + tobacco use and anxiety who is being seen for the evaluation of abnormal EKG and abnormal cardiac enzymes in the face of acute exacerbation of chronic lung disease.   Assessment & Plan    ELEVATED CARDIAC ENZYMES:  Demand ischemia related to acute pulmonary process.  Changed to bisoprolol yesterday.  Echo as above.  EF is normal.  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  Would plan invasive evaluation when  her lungs are improved.   I put her on for Monday with order written Dr. Burt Knack.   HYPERKALEMIA:  Repeat potassium pending.     Signed, Minus Breeding, MD  05/06/2017, 7:28 AM

## 2017-05-06 NOTE — Progress Notes (Signed)
Pt placed back on Bipap by RN due to desat 83%.

## 2017-05-06 NOTE — Progress Notes (Signed)
Family Medicine Teaching Service Daily Progress Note Intern Pager: [phone]  Patient name: Kayla Mack Medical record number: [id number] Date of birth: 05-04-1946 Age: 71 y.o. Gender: female  Primary Care Provider: System, Provider Not In Consultants: Cardiology, Critical care  Code Status: Full   Pt Overview and Major Events to Date:  Kayla Mack is a 71 y.o. Female presenting with SOB. Patient was admitted to [facility] on 05/04/2017.   Assessment and Plan: Kayla Ballo Blumenheinis a 71 y.o.femalepresenting with SOB. PMH is significant for anxiety, COPD, depression, hyperlipidemia, hypertension, tobacco abuse disorder.  SOB: Patient was recently diagnosed with COPD about 2 months ago,has a history of 75 pack years. Developed acold on [place] progressively worsening cough,congestion,sputum production,and dyspnea. Possible infiltrate of right lung base on CXR. Noted to have elevated JVD with CXR positive for pulmonary HTN likely contributing. Hx of HTN, tobacco abuse, and HLD, places patient at increase risk for cardiac. EKG on admission with Q-waves in lateral leads, and ST depression in v4-v6.Troponins trended at 0.08 > 0.07 > 0.05 > 0.14. Is currently on heparin. Mostly likely COPD with underlying Pneumonia diagnosis, EKG findings possibly demand ischemia as patient sinus tachycardic following breath treatment x 2 and CAT. Will however follow for ACS. ABG showing pH 7.356m pCO2 46.0, pO2 51.0, and bicarbonate of 26.4. Patient currently on on 4 L O2 with SpO2 93%. Was weaned down to 4 L O2 via nasal canula yesterday afternoon but was put back on bipap at 1am, transitioned back to 4L at 7am. Echo showing LV EF of 647-42% systolic pressure was severely increased. Likely pulmonary hypertension 2/2 obstructive lung disease. D-dimer of 0.39, showing PE unlikely.  - Continuous pulse oximetry  - Blood cultures and sputum culture pending  - Continue levaquin 750 mg for HCAP  - low  threshold to move to ICU - appreciate critical care recommendations  - continue supplemental O2 - PT/OT have recommended SNF placement, will discuss with patient   Hyperkalemia K of 4.9 this morning. Was given insulin and D50 per critical care. Have consulted cardiology. They recommend no kayexcelate at this time and no calcium gluconate due to no acute EKG changes at this time.  -appreciate cardiology recommendations   COPD  Appears more emphysema on physical exam due to patient body habitus, but no PFTs to confirm.  - Continue home Ellipta   Sinus Tachycardia:  RepeatEKG on admission with Q-waves in lateral leads, and ST depression in v4-v6. No hx of chest pain, diaphoresis, nausea/vomitting per patient - unlikely ACS. Mostly like sinus tachycardia is acting a stress test and patient likely has some blockages resulting in demand ischemia. Troponins trended at 0.08 > 0.07 > 0.05 > 0.14.  Cardiology has been consulted. K of 4.9, Mg of 1.7. Lipid panel wnl with slightly low HDL of 31. A1C of 5.8. TSH of 0.687. Cardiology has seen patient and states elevated cardiac enzymes due to demand ischemia related to acute pulmonary process. Recommended to continue heparin and change to bisoprolol. Have planned for echo and plan for cath after her lungs are improved. Per cardiac recommendations will plan for invasive evaluation on Monday. Echo showing LV EF of 659-56% systolic pressure was severely increased - appreciate cardiology recommendations   Tobacco Abuse Disorder  75 pack year history  - start patch  - tobacco cessation teaching   HLD:  Home lovastatin. Lipid panel wnl with slightly low HDL of 31. -Continue pravastatin  HTN: BP 136/73 - bisoprolol   Anxiety:  -Continue bupropion -  Continue home klonopin 0.5 mg prn   FEN/GI: Cardiac diet  PPx: heparin  Disposition: continue to monitor   Subjective:  Patient today states she is improved. States she took 2 acetaminophen and  clonazepam last night and was able to sleep well. Patient stated cardiology has spoken to her and plan for procedure on Monday. Patient is currently on 4 L O2. Patient states she is short of breath and has slight cough. States sputum is loosening up.   Objective: Temp:  [97.6 F (36.4 C)-99 F (37.2 C)] 97.8 F (36.6 C) (07/20 0729) Pulse Rate:  [73-98] 81 (07/20 0800) Resp:  [13-28] 16 (07/20 0800) BP: (100-122)/(51-80) 122/77 (07/20 0800) SpO2:  [93 %-100 %] 97 % (07/20 0800) FiO2 (%):  [40 %] 40 % (07/20 0129) Physical Exam: General: awake and alert sitting up in bed. Has [place] in place  Cardiovascular: RRR, no MRG Respiratory: expiratory wheezes auscultated bilaterally, improved from yesterday, increased work of breathing  Abdomen: soft, non tender, non distended, bowel sounds x 4 quadrants  Extremities: no edema, full range of motion   Laboratory:  Recent Labs Lab 05/04/17 1050 05/05/17 0053 05/06/17 0418  WBC 10.0 11.7* 15.8*  HGB 13.8 12.8 13.5  HCT 41.9 39.8 42.9  PLT 239 236 256    Recent Labs Lab 05/05/17 0053 05/05/17 0230 05/05/17 0958 05/06/17 0653  NA 133*  --  134* 135  K 5.8* 6.0* 5.4* 4.9  CL 105  --  105 102  CO2 21*  --  22 26  BUN 10  --  15 13  CREATININE 0.71  --  0.77 0.63  CALCIUM 8.2*  --  8.4* 8.7*  GLUCOSE 121*  --  118* 141*     Imaging/Diagnostic Tests: Dg Chest 2 View  Result Date: 05/04/2017 CLINICAL DATA:  Cough, shortness of breath, hypertension, COPD, smoker EXAM: CHEST  2 VIEW COMPARISON:  03/21/2017 FINDINGS: Normal heart size. Prominent central pulmonary arteries and hila question pulmonary arterial hypertension. Mediastinal contours otherwise normal. Atherosclerotic calcifications aorta. Emphysematous and bronchitic changes consistent with COPD. Bullous disease RIGHT upper lobe. Increased basilar markings versus previous study particularly on RIGHT cannot exclude superimposed infiltrate or atelectasis. Upper lungs clear. No  pleural effusion or pneumothorax. Bones demineralized with chronic compression deformity of a lower thoracic vertebra unchanged. IMPRESSION: COPD changes with mild superimposed atelectasis versus infiltrate at lung bases especially on RIGHT. 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 Aortic Atherosclerosis (ICD10-I70.0) and Emphysema (ICD10-J43.9). Electronically Signed   By: Lavonia Dana M.D.   On: 05/04/2017 08:26   Dg Chest Port 1 View  Result Date: 05/05/2017 CLINICAL DATA:  Shortness of breath EXAM: PORTABLE CHEST 1 VIEW COMPARISON:  Yesterday FINDINGS: Normal heart size. Stable fullness of the hila, with branching pattern suggesting pulmonary artery enlargement, stable. Main pulmonary artery shadow is also enlarged, findings of pulmonary hypertension. Advanced emphysema. Interstitial coarsening is at baseline. There is no edema, consolidation, effusion, or pneumothorax. IMPRESSION: 1. Advanced emphysema without acute superimposed finding. 2. Suspect pulmonary hypertension. Electronically Signed   By: Monte Fantasia M.D.   On: 05/05/2017 07:45     Caroline More, DO 05/06/2017, 11:22 AM PGY-1, [place] Intern pager: [phone], text pages welcome

## 2017-05-06 NOTE — Progress Notes (Signed)
ANTICOAGULATION CONSULT NOTE - Follow Up Consult  Pharmacy Consult for Heparin Indication: chest pain/ACS  Allergies  Allergen Reactions  . Aleve [Naproxen Sodium] Hives  . Aspirin Swelling    Facial swelling  . Penicillins Hives and Swelling    Has patient had a PCN reaction causing immediate rash, facial/tongue/throat swelling, SOB or lightheadedness with hypotension: Yes Has patient had a PCN reaction causing severe rash involving mucus membranes or skin necrosis: Yes Has patient had a PCN reaction that required hospitalization: Unk Has patient had a PCN reaction occurring within the last 10 years: No If all of the above answers are "NO", then may proceed with Cephalosporin use.     Patient Measurements: Height: _0  (157.5 cm) Weight: 110 lb 10.7 oz (50.2 kg) IBW/kg (Calculated) : 50.1  Vital Signs: Temp: 97.8 F (36.6 C) (07/20 0729) Temp Source: Oral (07/20 0729) BP: 122/77 (07/20 0800) Pulse Rate: 81 (07/20 0800)  Labs:  Recent Labs  05/04/17 1050 05/04/17 1641 05/04/17 2250  05/05/17 0053 05/05/17 0710 05/05/17 0958 05/05/17 1822 05/06/17 0418 05/06/17 0653  HGB 13.8  --   --   --  12.8  --   --   --  13.5  --   HCT 41.9  --   --   --  39.8  --   --   --  42.9  --   PLT 239  --   --   --  236  --   --   --  256  --   HEPARINUNFRC  --   --   --   < > 0.24*  --  0.15* 0.42 0.33  --   CREATININE  --   --   --   --  0.71  --  0.77  --   --  0.63  TROPONINI 0.08* 0.07* 0.05*  --   --  0.14*  --   --   --   --   < > = values in this interval not displayed.  Estimated Creatinine Clearance: 50.3 mL/min (by C-G formula based on SCr of 0.63 mg/dL).   Medications:  Heparin @ 850 units/hr  Assessment: 71yof continues on heparin for possible ACS. Heparin level is therapeutic at 0.33. CBC stable. No bleeding. Noted plan for cath Monday.  Goal of Therapy:  Heparin level 0.3-0.7 units/ml Monitor platelets by anticoagulation protocol: Yes   Plan:  1) Continue  heparin at 850 units/hr 2) Daily heparin level and CBC  Deboraha Sprang 05/06/2017,9:04 AM

## 2017-05-07 ENCOUNTER — Inpatient Hospital Stay (HOSPITAL_COMMUNITY): Payer: Medicare Other

## 2017-05-07 DIAGNOSIS — J9602 Acute respiratory failure with hypercapnia: Secondary | ICD-10-CM

## 2017-05-07 DIAGNOSIS — R748 Abnormal levels of other serum enzymes: Secondary | ICD-10-CM

## 2017-05-07 LAB — BLOOD GAS, ARTERIAL
Acid-Base Excess: 3.3 mmol/L — ABNORMAL HIGH (ref 0.0–2.0)
Bicarbonate: 29 mmol/L — ABNORMAL HIGH (ref 20.0–28.0)
Delivery systems: POSITIVE
Drawn by: 41977
Expiratory PAP: 5
FIO2: 40
Inspiratory PAP: 12
O2 Saturation: 98.2 %
Patient temperature: 98.6
pCO2 arterial: 59.3 mmHg — ABNORMAL HIGH (ref 32.0–48.0)
pH, Arterial: 7.311 — ABNORMAL LOW (ref 7.350–7.450)
pO2, Arterial: 116 mmHg — ABNORMAL HIGH (ref 83.0–108.0)

## 2017-05-07 LAB — COMPREHENSIVE METABOLIC PANEL
ALT: 36 U/L (ref 14–54)
AST: 29 U/L (ref 15–41)
Albumin: 3.2 g/dL — ABNORMAL LOW (ref 3.5–5.0)
Alkaline Phosphatase: 62 U/L (ref 38–126)
Anion gap: 8 (ref 5–15)
BUN: 13 mg/dL (ref 6–20)
CO2: 28 mmol/L (ref 22–32)
Calcium: 8.6 mg/dL — ABNORMAL LOW (ref 8.9–10.3)
Chloride: 100 mmol/L — ABNORMAL LOW (ref 101–111)
Creatinine, Ser: 0.64 mg/dL (ref 0.44–1.00)
GFR calc Af Amer: >60 mL/min (ref >60)
GFR calc non Af Amer: >60 mL/min (ref >60)
Glucose, Bld: 146 mg/dL — ABNORMAL HIGH (ref 65–99)
Potassium: 4.7 mmol/L (ref 3.5–5.1)
Sodium: 136 mmol/L (ref 135–145)
Total Bilirubin: 0.6 mg/dL (ref 0.3–1.2)
Total Protein: 5.7 g/dL — ABNORMAL LOW (ref 6.5–8.1)

## 2017-05-07 LAB — CBC WITH DIFFERENTIAL/PLATELET
Basophils Absolute: 0 10*3/uL (ref 0.0–0.1)
Basophils Relative: 0 %
Eosinophils Absolute: 0 10*3/uL (ref 0.0–0.7)
Eosinophils Relative: 0 %
HCT: 41.8 % (ref 36.0–46.0)
Hemoglobin: 12.8 g/dL (ref 12.0–15.0)
Lymphocytes Relative: 6 %
Lymphs Abs: 0.8 10*3/uL (ref 0.7–4.0)
MCH: 30.5 pg (ref 26.0–34.0)
MCHC: 30.6 g/dL (ref 30.0–36.0)
MCV: 99.5 fL (ref 78.0–100.0)
Monocytes Absolute: 0.4 10*3/uL (ref 0.1–1.0)
Monocytes Relative: 3 %
Neutro Abs: 13.1 10*3/uL — ABNORMAL HIGH (ref 1.7–7.7)
Neutrophils Relative %: 91 %
Platelets: 229 10*3/uL (ref 150–400)
RBC: 4.2 MIL/uL (ref 3.87–5.11)
RDW: 14.3 % (ref 11.5–15.5)
WBC: 14.4 10*3/uL — ABNORMAL HIGH (ref 4.0–10.5)

## 2017-05-07 LAB — CULTURE, RESPIRATORY W GRAM STAIN

## 2017-05-07 LAB — CULTURE, RESPIRATORY: Culture: NORMAL

## 2017-05-07 LAB — HEPARIN LEVEL (UNFRACTIONATED): Heparin Unfractionated: 0.3 IU/mL (ref 0.30–0.70)

## 2017-05-07 MED ORDER — FENTANYL CITRATE (PF) 100 MCG/2ML IJ SOLN: 50.0000 ug | Freq: Once | INTRAMUSCULAR | Status: DC

## 2017-05-07 NOTE — Clinical Social Work Note (Signed)
Clinical Social Work Assessment  Patient Details  Name: Kayla Mack MRN: [id number] Date of Birth: 1946/06/25  Date of referral:  05/07/17               Reason for consult:  Facility Placement, Discharge Planning                Permission sought to share information with:  Case Manager, Customer service manager, Family Supports Permission granted to share information::  Yes, Verbal Permission Granted  Name::        Agency::     Relationship::  patient mother  Contact Information:     Housing/Transportation Living arrangements for the past 2 months:  [place] of Information:  Patient, Medical Team, Case Manager Patient Interpreter Needed:  None Criminal Activity/Legal Involvement Pertinent to Current Situation/Hospitalization:  No - Comment as needed Significant Relationships:  Other Family Members, Parents Lives with:  Parents Do you feel safe going back to the place where you live?  Yes Need for family participation in patient care:  No (Coment)  Care giving concerns:  Patient coming from home where she takes care of her mother and self. Both mother and patient dx with COPD, and recommendations for patient remain for ST SNF, but mother going home. Patient expresses concern that no one will be there to take care of her mother.  Patient still pending medical course and acute with using BIPAP and possible need to intubate due to SOB while on BIPAP.    SNF is pending at this time due to breathing issues and patient not medically stable. Patient verbalizes wanting her mother safe and ability to care for mother.    LCSW made contact with patient's best friend Kayla Mack who reports patient lives alone and takes care of her mother and the two are co-dependent on one another.  Reports patient is independent and will go care for mother who lives 1 mile away.  Patient reports Kayla Mack is her best friend and she wants her to be involved.  Patient updated that Kayla Mack has all  the animals for patient and mother and they are well taken care of.   Social Worker assessment / plan:  Consult placed for SNF, rehab at discharge.  This plan is undecided at this time as patient concerned with mother's health and her own with regards to plans with breathing/SOB.  LCSW will continue to follow acutely and assist with discharge planning needs.  SNF has been recommended and per clinical work up will be re-addressed.    Patient will most likely go home if her mother is discharged home as she will want to take care of her. LCSW will follow up with discharge plans, but may need MD to assist with importance of taking care of herself and focusing on her recovery.  Employment status:  Retired Forensic scientist:  [employer] PT Recommendations:  [place], [place] / Referral to community resources:  [place]  Patient/Family's Response to care:  Anxious about her mother and animals. All are taken care of and LCSW verified with friend  Patient/Family's Understanding of and Emotional Response to Diagnosis, Current Treatment, and Prognosis:  Patient reports " Kayla Mack needs to know what is going on because I have a bad lung and COPD and this is getting real".  Patient very anxious in assessment, but is able to re-directed, given emotional support and assistance to make contact with friend and update.  Emotional Assessment Appearance:  Appears  stated age Attitude/Demeanor/Rapport:    Affect (typically observed):  Accepting, Adaptable, Pleasant Orientation:  Oriented to Self, Oriented to Place, Oriented to  Time, Oriented to Situation Alcohol / Substance use:  Not Applicable Psych involvement (Current and /or in the community):  No (Comment)  Discharge Needs  Concerns to be addressed:  No discharge needs identified Readmission within the last 30 days:  No Current discharge risk:  Other (Patient mother is currently admitted to [facility],  patient is mother's caregiver) Barriers to Discharge:  Continued Medical Work up   [place], LCSW 05/07/2017, 1:47 PM

## 2017-05-07 NOTE — Progress Notes (Addendum)
CRITICAL VALUE ALERT  Critical Value:  PCO2 59.3  (arterial)  Date & Time Notied:  05/07/2017 0445  Provider Notified: Deter[place] MD  Orders Received/Actions taken: none

## 2017-05-07 NOTE — Progress Notes (Signed)
Progress Note  Patient Name: Kayla Mack Date of Encounter: 05/07/2017  Primary Cardiologist: Dr. Percival Spanish  Subjective   Worsening SOB and now on BiPAP  Inpatient Medications    Scheduled Meds: . arformoterol  15 mcg Nebulization BID  . bisoprolol  5 mg Oral Daily  . budesonide (PULMICORT) nebulizer solution  0.5 mg Nebulization BID  . buPROPion  150 mg Oral Daily  . feeding supplement (ENSURE ENLIVE)  237 mL Oral BID BM  . ipratropium  0.5 mg Nebulization Q6H  . levalbuterol  0.63 mg Nebulization Q6H  . methylPREDNISolone (SOLU-MEDROL) injection  60 mg Intravenous Q6H  . PARoxetine  20 mg Oral Daily  . pravastatin  20 mg Oral q1800   Continuous Infusions: . heparin 850 Units/hr (05/07/17 0700)  . levofloxacin (LEVAQUIN) IV Stopped (05/06/17 1201)   PRN Meds: acetaminophen **OR** acetaminophen, clonazePAM   Vital Signs    Vitals:   05/07/17 0739 05/07/17 0742 05/07/17 0818 05/07/17 0851  BP:      Pulse:   82   Resp:   19   Temp:    97.9 F (36.6 C)  TempSrc:    Oral  SpO2: 96% 96% 94%   Weight:      Height:        Intake/Output Summary (Last 24 hours) at 05/07/17 0919 Last data filed at 05/07/17 0800  Gross per 24 hour  Intake              334 ml  Output              575 ml  Net             -241 ml   Filed Weights   05/05/17 0237 05/06/17 1323 05/07/17 0437  Weight: 110 lb 10.7 oz (50.2 kg) 111 lb 6.4 oz (50.5 kg) 111 lb 12.4 oz (50.7 kg)    Telemetry    NSR - Personally Reviewed  ECG    No new EKG to review - Personally Reviewed  Physical Exam   GEN: moderate respiratory distress on BIPAP  Neck: No JVD Cardiac: RRR, no murmurs, rubs, or gallops.  Respiratory:decreased air movement with expiratory wheezes GI: Soft, nontender, non-distended  MS: No edema; No deformity. Neuro:  Nonfocal  Psych: Normal affect   Labs    Chemistry Recent Labs Lab 05/05/17 0958 05/06/17 0653 05/07/17 0307  NA 134* 135 136  K 5.4* 4.9 4.7  CL  105 102 100*  CO2 _0 GLUCOSE 118* 141* 146*  BUN _1 CREATININE 0.77 0.63 0.64  CALCIUM 8.4* 8.7* 8.6*  PROT  --   --  5.7*  ALBUMIN  --   --  3.2*  AST  --   --  29  ALT  --   --  36  ALKPHOS  --   --  62  BILITOT  --   --  0.6  GFRNONAA >60 >60 >60  GFRAA >60 >60 >60  ANIONGAP _2 Hematology Recent Labs Lab 05/05/17 0053 05/06/17 0418 05/07/17 0307  WBC 11.7* 15.8* 14.4*  RBC 4.11 4.30 4.20  HGB 12.8 13.5 12.8  HCT 39.8 42.9 41.8  MCV 96.8 99.8 99.5  MCH 31.1 31.4 30.5  MCHC 32.2 31.5 30.6  RDW 14.3 14.3 14.3  PLT 236 256 229    Cardiac Enzymes Recent Labs Lab 05/04/17 1050 05/04/17 1641 05/04/17 2250 05/05/17 0710  TROPONINI 0.08* 0.07* 0.05* 0.14*  Recent Labs Lab 05/04/17 0806  TROPIPOC 0.04     BNPNo results for input(s): BNP, PROBNP in the last 168 hours.   DDimer  Recent Labs Lab 05/05/17 1054  DDIMER 0.39     Radiology    Dg Chest Port 1 View  Result Date: 05/07/2017 CLINICAL DATA:  COPD.  Follow-up. EXAM: PORTABLE CHEST 1 VIEW COMPARISON:  05/06/2017 FINDINGS: Lungs are hyperexpanded with increased lucency/emphysematous change over the mid to upper lungs. No focal airspace consolidation or effusion. Stable borderline cardiomegaly. Mild prominence of the central pulmonary arteries which could be due to pulmonary arterial hypertension. Calcified plaque over the thoracic aorta. Remainder of the exam is unchanged. IMPRESSION: No acute cardiopulmonary disease. Emphysema (ICD10-J43.9). Aortic Atherosclerosis (ICD10-I70.0). Electronically Signed   By: Marin Olp M.D.   On: 05/07/2017 07:33   Dg Chest Port 1 View  Result Date: 05/06/2017 CLINICAL DATA:  Chest congestion and productive cough. History of COPD. EXAM: PORTABLE CHEST 1 VIEW COMPARISON:  05/05/2017 ; 03/21/2017 ; chest CT - 06/02/2013 FINDINGS: Grossly unchanged cardiac silhouette and mediastinal contours with atherosclerotic plaque within the thoracic aorta. The  lungs remain hyperexpanded with flattening of the bilateral diaphragms and thinning of the biapical pulmonary parenchyma, right greater than left. No discrete focal airspace opacities. No pleural effusion or pneumothorax. No evidence of edema. No acute osseus abnormalities. IMPRESSION: 1. Hyperexpanded lungs without acute cardiopulmonary disease. 2. Aortic Atherosclerosis (ICD10-I70.0) and Emphysema (ICD10-J43.9). Electronically Signed   By: Sandi Mariscal M.D.   On: 05/06/2017 12:13    Cardiac Studies   2D echo 04/2017 Study Conclusions  - Left ventricle: The cavity size was normal. There was mild   concentric hypertrophy. Systolic function was normal. The   estimated ejection fraction was in the range of 60% to 65%. Wall   motion was normal; there were no regional wall motion   abnormalities. - Aortic valve: Transvalvular velocity was within the normal range.   There was no stenosis. There was no regurgitation. - Mitral valve: Transvalvular velocity was within the normal range.   There was no evidence for stenosis. There was mild regurgitation. - Right ventricle: The cavity size was normal. Wall thickness was   normal. Systolic function was normal. - Tricuspid valve: There was trivial regurgitation. - Pulmonary arteries: Systolic pressure was severely increased. PA   peak pressure: 63 mm Hg (S). - Pericardium, extracardiac: Minimal ascites was noted.   Patient Profile     71 y.o. female  with a hx of HTN, HLD, COPD, + tobacco use and anxiety who is being seen for the evaluation of abnormal EKG and abnormal cardiac enzymes in the face of acute exacerbation of chronic lung disease.   Assessment & Plan    1.  Elevated troponin - ? Demand ischemia related to COPD and acute exacerbation.   - 2D echo with normal LVF and pulmonary HTN - flat trend in trop (0.08>>0.07>>0.05>>0.14) - plan for R and L heart cath once respiratory status improved  - continue bisoprolol/IV Heparin gtt/statin -  not on ASA due to allergy  2.  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  Hyperkalemia  - K+ normalized  4.  Acute exacerbation of COPD - currently on BiPAP but working hard to breath - cxray with no edema - per CCM  Signed, TFransico Him MD  05/07/2017, 9:19 AM

## 2017-05-07 NOTE — Progress Notes (Signed)
Name: Kayla Mack MRN: [id number] DOB: 09-01-1946    ADMISSION DATE:  05/04/2017 CONSULTATION DATE:  05/04/17  REFERRING MD :  Gwendlyn Deutscher  CHIEF COMPLAINT:  SOB   HISTORY OF PRESENT ILLNESS:  Kayla Mack is a 71 y.o. female with a PMH as outlined below including but not limited to COPD.  She was admitted 7/18 with SOB x 3 days along with cough, presumed due to AECOPD.  She has a long standing smoking history, roughly 75 pack years. She was admitted and was started on levaquin and BD's.  During early AM hours 7/19, PCCM was called by nursing staff for assistance due to increased WOB.  Pt has apparently had increased WOB throughout the night and now has worsening wheezing.  She was on bedside commode earlier and desaturated into the high 80's.  She was then placed on VM and had improvement in sats to high 90's.  Currently sats 98% but has increased WOB along with significant wheeze. Has not received any steroids since admission.  Denies chest pain, lightheadedness.  SUBJECTIVE:  On BiPAP most of night and AM, longest break about 1.5 hours. Still SOB on BiPAP.   VITAL SIGNS: Temp:  [97.8 F (36.6 C)-98.7 F (37.1 C)] 97.9 F (36.6 C) (07/21 0851) Pulse Rate:  [70-89] 82 (07/21 0818) Resp:  [12-25] 19 (07/21 0818) BP: (105-132)/(63-83) 124/72 (07/21 0700) SpO2:  [83 %-100 %] 94 % (07/21 0818) FiO2 (%):  [40 %] 40 % (07/21 0400) Weight:  [50.5 kg (111 lb 6.4 oz)-50.7 kg (111 lb 12.4 oz)] 50.7 kg (111 lb 12.4 oz) (07/21 0437)  PHYSICAL EXAMINATION: General:cachectic female in mild distress on BiPAP Neuro: Awake, alert, oriented.  HEENT: [place]/AT, PERRL, mild JVD PULM: wheeze improved. Pool air movement bilaterally.  CV: RRR, no MRG OE:UMPN, non-tender, non-distended Extremities: no edema    Recent Labs Lab 05/05/17 0958 05/06/17 0653 05/07/17 0307  NA 134* 135 136  K 5.4* 4.9 4.7  CL 105 102 100*  CO2 _0 BUN _1 CREATININE 0.77 0.63 0.64  GLUCOSE  118* 141* 146*    Recent Labs Lab 05/05/17 0053 05/06/17 0418 05/07/17 0307  HGB 12.8 13.5 12.8  HCT 39.8 42.9 41.8  WBC 11.7* 15.8* 14.4*  PLT 236 256 229   Dg Chest Port 1 View  Result Date: 05/07/2017 CLINICAL DATA:  COPD.  Follow-up. EXAM: PORTABLE CHEST 1 VIEW COMPARISON:  05/06/2017 FINDINGS: Lungs are hyperexpanded with increased lucency/emphysematous change over the mid to upper lungs. No focal airspace consolidation or effusion. Stable borderline cardiomegaly. Mild prominence of the central pulmonary arteries which could be due to pulmonary arterial hypertension. Calcified plaque over the thoracic aorta. Remainder of the exam is unchanged. IMPRESSION: No acute cardiopulmonary disease. Emphysema (ICD10-J43.9). Aortic Atherosclerosis (ICD10-I70.0). Electronically Signed   By: Marin Olp M.D.   On: 05/07/2017 07:33   Dg Chest Port 1 View  Result Date: 05/06/2017 CLINICAL DATA:  Chest congestion and productive cough. History of COPD. EXAM: PORTABLE CHEST 1 VIEW COMPARISON:  05/05/2017 ; 03/21/2017 ; chest CT - 06/02/2013 FINDINGS: Grossly unchanged cardiac silhouette and mediastinal contours with atherosclerotic plaque within the thoracic aorta. The lungs remain hyperexpanded with flattening of the bilateral diaphragms and thinning of the biapical pulmonary parenchyma, right greater than left. No discrete focal airspace opacities. No pleural effusion or pneumothorax. No evidence of edema. No acute osseus abnormalities. IMPRESSION: 1. Hyperexpanded lungs without acute cardiopulmonary disease. 2. Aortic Atherosclerosis (ICD10-I70.0) and Emphysema (ICD10-J43.9). Electronically  Signed   By: Sandi Mariscal M.D.   On: 05/06/2017 12:13    STUDIES:  CXR 7/18 > COPD changes. Echo 7/19 > LVEF 60-65%, no wall motion abnormalities.   SIGNIFICANT EVENTS  7/18 > admit. 7/19 > PCCM consult.  ASSESSMENT / PLAN:  Acute hypoxic respiratory failure - due to AECOPD. PNA likely left greater  rt AECOPD - apparently told she has COPD just 2 months ago; however, no PFT's available in system. Tobacco dependence. Plan: Continue PRN BIPAP If no significant progression over the next few hours may need to consider intubation.  Maximize bronchodialtors (Budesonide, Brovana, scheduled atrovent/xopenex) Solumedrol 77m q6. Probably max does for her size. Empiric levaquin  Elevated troponin - suspect demand ischemia.  Has been started on heparin for possible ACS. Did have some ST depression during admission with tachycardia. No wall motion abnormalities on echo.  Plan: Cardiology following Heparin infusion R and L heart cath pre-discharge  Hx HTN, HLD. Plan: Continue atenolol, pravastatin.  Anxiety. Plan: Continue bupropion, klonopin.  Hyperglycemia Plan: Monitor glucose on daily BMP, add SSI if greater than 180.     PGeorgann Housekeeper AGACNP-BC LHouston Methodist Continuing Care HospitalPulmonology/Critical Care Pager [phone] [phone] 05/07/2017 10:42 AM

## 2017-05-07 NOTE — Progress Notes (Signed)
ANTICOAGULATION CONSULT NOTE - Follow Up Consult  Pharmacy Consult for Heparin Indication: chest pain/ACS  Allergies  Allergen Reactions  . Aleve [Naproxen Sodium] Hives  . Aspirin Swelling    Facial swelling  . Penicillins Hives and Swelling    Has patient had a PCN reaction causing immediate rash, facial/tongue/throat swelling, SOB or lightheadedness with hypotension: Yes Has patient had a PCN reaction causing severe rash involving mucus membranes or skin necrosis: Yes Has patient had a PCN reaction that required hospitalization: Unk Has patient had a PCN reaction occurring within the last 10 years: No If all of the above answers are "NO", then may proceed with Cephalosporin use.     Patient Measurements: Height: 5' 2" (157.5 cm) Weight: 111 lb 12.4 oz (50.7 kg) IBW/kg (Calculated) : 50.1  Vital Signs: Temp: 97.9 F (36.6 C) (07/21 0851) Temp Source: Oral (07/21 0851) BP: 124/72 (07/21 0700) Pulse Rate: 82 (07/21 0818)  Labs:  Recent Labs  05/04/17 1641 05/04/17 2250  05/05/17 0053 05/05/17 0710 05/05/17 2683 05/05/17 1822 05/06/17 0418 05/06/17 0653 05/07/17 0307  HGB  --   --   < > 12.8  --   --   --  13.5  --  12.8  HCT  --   --   --  39.8  --   --   --  42.9  --  41.8  PLT  --   --   --  236  --   --   --  256  --  229  HEPARINUNFRC  --   --   < > 0.24*  --  0.15* 0.42 0.33  --  0.30  CREATININE  --   --   < > 0.71  --  0.77  --   --  0.63 0.64  TROPONINI 0.07* 0.05*  --   --  0.14*  --   --   --   --   --   < > = values in this interval not displayed.  Estimated Creatinine Clearance: 50.3 mL/min (by C-G formula based on SCr of 0.64 mg/dL).   Assessment: 71yof continues on heparin for possible ACS.   Hep 850 units/hr with lvl 0.3  Cath scheduled for mon but resp status may delay  Goal of Therapy:  Heparin level 0.3-0.7 units/ml Monitor platelets by anticoagulation protocol: Yes   Plan:  - continue heparin at 850 units/hr - daily heparin level,  CBC  Levester Fresh, PharmD, BCPS, BCCCP Clinical Pharmacist Clinical phone for 05/07/2017 from 7a-3:30p: [phone] If after 3:30p, please call main pharmacy at: [phone] 05/07/2017 11:19 AM

## 2017-05-07 NOTE — Progress Notes (Signed)
Patient placed back on BiPAP due to decrease in SATs and increased WOB.

## 2017-05-08 DIAGNOSIS — I272 Pulmonary hypertension, unspecified: Secondary | ICD-10-CM

## 2017-05-08 LAB — BASIC METABOLIC PANEL
Anion gap: 5 (ref 5–15)
BUN: 20 mg/dL (ref 6–20)
CO2: 32 mmol/L (ref 22–32)
Calcium: 8.4 mg/dL — ABNORMAL LOW (ref 8.9–10.3)
Chloride: 100 mmol/L — ABNORMAL LOW (ref 101–111)
Creatinine, Ser: 0.56 mg/dL (ref 0.44–1.00)
GFR calc Af Amer: >60 mL/min (ref >60)
GFR calc non Af Amer: >60 mL/min (ref >60)
Glucose, Bld: 154 mg/dL — ABNORMAL HIGH (ref 65–99)
Potassium: 4.2 mmol/L (ref 3.5–5.1)
Sodium: 137 mmol/L (ref 135–145)

## 2017-05-08 LAB — CBC
HCT: 37.8 % (ref 36.0–46.0)
Hemoglobin: 12.1 g/dL (ref 12.0–15.0)
MCH: 31.2 pg (ref 26.0–34.0)
MCHC: 32 g/dL (ref 30.0–36.0)
MCV: 97.4 fL (ref 78.0–100.0)
Platelets: 217 10*3/uL (ref 150–400)
RBC: 3.88 MIL/uL (ref 3.87–5.11)
RDW: 14.1 % (ref 11.5–15.5)
WBC: 10.7 10*3/uL — ABNORMAL HIGH (ref 4.0–10.5)

## 2017-05-08 LAB — HEPARIN LEVEL (UNFRACTIONATED): Heparin Unfractionated: 0.31 IU/mL (ref 0.30–0.70)

## 2017-05-08 MED ORDER — SODIUM CHLORIDE 0.9 % WEIGHT BASED INFUSION
1.0000 mL/kg/h | INTRAVENOUS | Status: DC
  Administered 2017-05-09: 1 mL/kg/h via INTRAVENOUS

## 2017-05-08 MED ORDER — METHYLPREDNISOLONE SODIUM SUCC 125 MG IJ SOLR
60.0000 mg | Freq: Three times a day (TID) | INTRAMUSCULAR | Status: DC
  Administered 2017-05-08 – 2017-05-09 (×3): 60 mg via INTRAVENOUS
  Filled 2017-05-08: qty 0.96
  Filled 2017-05-08: qty 2
  Filled 2017-05-08 (×2): qty 0.96

## 2017-05-08 MED ORDER — SODIUM CHLORIDE 0.9 % IV SOLN: 250.0000 mL | INTRAVENOUS | Status: DC | PRN

## 2017-05-08 MED ORDER — SODIUM CHLORIDE 0.9% FLUSH: 3.0000 mL | INTRAVENOUS | Status: DC | PRN

## 2017-05-08 MED ORDER — LEVALBUTEROL HCL 0.63 MG/3ML IN NEBU
0.6300 mg | INHALATION_SOLUTION | RESPIRATORY_TRACT | Status: DC
  Administered 2017-05-08 – 2017-05-09 (×6): 0.63 mg via RESPIRATORY_TRACT
  Filled 2017-05-08 (×7): qty 3

## 2017-05-08 MED ORDER — SODIUM CHLORIDE 0.9% FLUSH
3.0000 mL | Freq: Two times a day (BID) | INTRAVENOUS | Status: DC
  Administered 2017-05-08 – 2017-05-09 (×2): 3 mL via INTRAVENOUS

## 2017-05-08 MED ORDER — IPRATROPIUM BROMIDE 0.02 % IN SOLN
0.5000 mg | Freq: Four times a day (QID) | RESPIRATORY_TRACT | Status: DC
  Administered 2017-05-08 – 2017-05-09 (×4): 0.5 mg via RESPIRATORY_TRACT
  Filled 2017-05-08 (×4): qty 2.5

## 2017-05-08 MED ORDER — SODIUM CHLORIDE 0.9 % WEIGHT BASED INFUSION: 3.0000 mL/kg/h | INTRAVENOUS | Status: DC

## 2017-05-08 NOTE — Progress Notes (Signed)
  FPTS Social Note  Visited Ms. Ramberg, she is feeling well. She has [place] prongs below her chin, nursing placed these back in her nose. Appreciate excellent care by CCM. We are happy to resume care once she is transferred out of ICU.  Lucila Maine, DO PGY-2, [facility] 05/08/2017 9:15 AM  Intern Pager: [phone]

## 2017-05-08 NOTE — Progress Notes (Addendum)
Progress Note  Patient Name: Kayla Mack Date of Encounter: 05/08/2017  Primary Cardiologist: Dr. Percival Spanish  Subjective   SOB much improved today and off BiPAP.  Able to lay flat with no SOB  Inpatient Medications    Scheduled Meds: . arformoterol  15 mcg Nebulization BID  . bisoprolol  5 mg Oral Daily  . budesonide (PULMICORT) nebulizer solution  0.5 mg Nebulization BID  . buPROPion  150 mg Oral Daily  . feeding supplement (ENSURE ENLIVE)  237 mL Oral BID BM  . ipratropium  0.5 mg Nebulization Q6H  . levalbuterol  0.63 mg Nebulization Q6H  . methylPREDNISolone (SOLU-MEDROL) injection  60 mg Intravenous Q8H  . PARoxetine  20 mg Oral Daily  . pravastatin  20 mg Oral q1800   Continuous Infusions: . heparin 850 Units/hr (05/08/17 0116)  . levofloxacin (LEVAQUIN) IV 750 mg (05/08/17 1122)   PRN Meds: acetaminophen **OR** acetaminophen, clonazePAM   Vital Signs    Vitals:   05/08/17 0800 05/08/17 0818 05/08/17 0830 05/08/17 0900  BP: 123/74  123/74 133/87  Pulse: 85 95 98 (!) 107  Resp: 18 (!) 23 (!) 26 20  Temp:  97.7 F (36.5 C)    TempSrc:  Oral    SpO2: 96% 91% 96% 95%  Weight:      Height:        Intake/Output Summary (Last 24 hours) at 05/08/17 1129 Last data filed at 05/08/17 1000  Gross per 24 hour  Intake            669.5 ml  Output              450 ml  Net            219.5 ml   Filed Weights   05/06/17 1323 05/07/17 0437 05/08/17 0600  Weight: 111 lb 6.4 oz (50.5 kg) 111 lb 12.4 oz (50.7 kg) 109 lb 5.6 oz (49.6 kg)    Telemetry    NSR - Personally Reviewed  ECG    No new EKG to review - Personally Reviewed  Physical Exam   GEN: WD, thin in NAD Neck: no bruit Cardiac: RRR with no M/R/G Respiratory:scattered expiratory wheezes GI: soft, NT, ND MS:no edema Neuro:  A&O x 3  Psych: normal mood  Labs    Chemistry  Recent Labs Lab 05/06/17 0653 05/07/17 0307 05/08/17 0212  NA 135 136 137  K 4.9 4.7 4.2  CL 102 100* 100*    CO2 26 28 32  GLUCOSE 141* 146* 154*  BUN _0 CREATININE 0.63 0.64 0.56  CALCIUM 8.7* 8.6* 8.4*  PROT  --  5.7*  --   ALBUMIN  --  3.2*  --   AST  --  29  --   ALT  --  36  --   ALKPHOS  --  62  --   BILITOT  --  0.6  --   GFRNONAA >60 >60 >60  GFRAA >60 >60 >60  ANIONGAP _1 Hematology  Recent Labs Lab 05/06/17 0418 05/07/17 0307 05/08/17 0212  WBC 15.8* 14.4* 10.7*  RBC 4.30 4.20 3.88  HGB 13.5 12.8 12.1  HCT 42.9 41.8 37.8  MCV 99.8 99.5 97.4  MCH 31.4 30.5 31.2  MCHC 31.5 30.6 32.0  RDW 14.3 14.3 14.1  PLT 256 229 217    Cardiac Enzymes  Recent Labs Lab 05/04/17 1050 05/04/17 1641 05/04/17 2250 05/05/17 0710  TROPONINI 0.08*  0.07* 0.05* 0.14*     Recent Labs Lab 05/04/17 0806  TROPIPOC 0.04     BNPNo results for input(s): BNP, PROBNP in the last 168 hours.   DDimer   Recent Labs Lab 05/05/17 1054  DDIMER 0.39     Radiology    Dg Chest Port 1 View  Result Date: 05/07/2017 CLINICAL DATA:  COPD.  Follow-up. EXAM: PORTABLE CHEST 1 VIEW COMPARISON:  05/06/2017 FINDINGS: Lungs are hyperexpanded with increased lucency/emphysematous change over the mid to upper lungs. No focal airspace consolidation or effusion. Stable borderline cardiomegaly. Mild prominence of the central pulmonary arteries which could be due to pulmonary arterial hypertension. Calcified plaque over the thoracic aorta. Remainder of the exam is unchanged. IMPRESSION: No acute cardiopulmonary disease. Emphysema (ICD10-J43.9). Aortic Atherosclerosis (ICD10-I70.0). Electronically Signed   By: Marin Olp M.D.   On: 05/07/2017 07:33   Dg Chest Port 1 View  Result Date: 05/06/2017 CLINICAL DATA:  Chest congestion and productive cough. History of COPD. EXAM: PORTABLE CHEST 1 VIEW COMPARISON:  05/05/2017 ; 03/21/2017 ; chest CT - 06/02/2013 FINDINGS: Grossly unchanged cardiac silhouette and mediastinal contours with atherosclerotic plaque within the thoracic aorta. The lungs  remain hyperexpanded with flattening of the bilateral diaphragms and thinning of the biapical pulmonary parenchyma, right greater than left. No discrete focal airspace opacities. No pleural effusion or pneumothorax. No evidence of edema. No acute osseus abnormalities. IMPRESSION: 1. Hyperexpanded lungs without acute cardiopulmonary disease. 2. Aortic Atherosclerosis (ICD10-I70.0) and Emphysema (ICD10-J43.9). Electronically Signed   By: Sandi Mariscal M.D.   On: 05/06/2017 12:13    Cardiac Studies   2D echo 04/2017 Study Conclusions  - Left ventricle: The cavity size was normal. There was mild   concentric hypertrophy. Systolic function was normal. The   estimated ejection fraction was in the range of 60% to 65%. Wall   motion was normal; there were no regional wall motion   abnormalities. - Aortic valve: Transvalvular velocity was within the normal range.   There was no stenosis. There was no regurgitation. - Mitral valve: Transvalvular velocity was within the normal range.   There was no evidence for stenosis. There was mild regurgitation. - Right ventricle: The cavity size was normal. Wall thickness was   normal. Systolic function was normal. - Tricuspid valve: There was trivial regurgitation. - Pulmonary arteries: Systolic pressure was severely increased. PA   peak pressure: 63 mm Hg (S). - Pericardium, extracardiac: Minimal ascites was noted.   Patient Profile     71 y.o. female  with a hx of HTN, HLD, COPD, + tobacco use and anxiety who is being seen for the evaluation of abnormal EKG and abnormal cardiac enzymes in the face of acute exacerbation of chronic lung disease.   Assessment & Plan    1.  Elevated troponin - ? Demand ischemia related to COPD and acute exacerbation.   - 2D echo with normal LVF and pulmonary HTN - flat trend in trop (0.08>>0.07>>0.05>>0.14) - Respiratory status significantly improved today so will plan L&R heart cath tomorrow  - continue bisoprolol/IV  Heparin gtt/statin - not on ASA due to allergy - Cardiac catheterization was discussed with the patient fully. The patient understands that risks include but are not limited to stroke (1 in 1000), death (1 in 69), kidney failure [usually temporary] (1 in 500), bleeding (1 in 200), allergic reaction [possibly serious] (1 in 200).  The patient understands and is willing to proceed.    2.  Moderate pulmonary HTN with PASP  21mHg - likely WHO Group III secondary to COPD - right heart cath tomorrow  3.  Hyperkalemia  - K+ normalized  4.  Acute exacerbation of COPD - breathing improved and now off BIPAP - cxray with no edema - per CCM  Signed, TFransico Him MD  05/08/2017, 11:29 AM

## 2017-05-08 NOTE — Progress Notes (Signed)
ANTICOAGULATION CONSULT NOTE - Follow Up Consult  Pharmacy Consult for Heparin Indication: chest pain/ACS  Allergies  Allergen Reactions  . Aleve [Naproxen Sodium] Hives  . Aspirin Swelling    Facial swelling  . Penicillins Hives and Swelling    Has patient had a PCN reaction causing immediate rash, facial/tongue/throat swelling, SOB or lightheadedness with hypotension: Yes Has patient had a PCN reaction causing severe rash involving mucus membranes or skin necrosis: Yes Has patient had a PCN reaction that required hospitalization: Unk Has patient had a PCN reaction occurring within the last 10 years: No If all of the above answers are "NO", then may proceed with Cephalosporin use.     Patient Measurements: Height: 5' 2" (157.5 cm) Weight: 109 lb 5.6 oz (49.6 kg) IBW/kg (Calculated) : 50.1  Vital Signs: Temp: 97.7 F (36.5 C) (07/22 0818) Temp Source: Oral (07/22 0818) BP: 133/87 (07/22 0900) Pulse Rate: 107 (07/22 0900)  Labs:  Recent Labs  05/06/17 0418 05/06/17 0653 05/07/17 0307 05/08/17 0212  HGB 13.5  --  12.8 12.1  HCT 42.9  --  41.8 37.8  PLT 256  --  229 217  HEPARINUNFRC 0.33  --  0.30 0.31  CREATININE  --  0.63 0.64 0.56    Estimated Creatinine Clearance: 49.8 mL/min (by C-G formula based on SCr of 0.56 mg/dL).   Assessment: 71yof continues on heparin for possible ACS.   Hep 850 units/hr with lvl 0.31  Cath scheduled for mon but resp status may delay  Goal of Therapy:  Heparin level 0.3-0.7 units/ml Monitor platelets by anticoagulation protocol: Yes   Plan:  - continue heparin at 850 units/hr - daily heparin level, CBC  Levester Fresh, PharmD, BCPS, BCCCP Clinical Pharmacist Clinical phone for 05/08/2017 from 7a-3:30p: [phone] If after 3:30p, please call main pharmacy at: [phone] 05/08/2017 10:31 AM

## 2017-05-08 NOTE — Progress Notes (Signed)
Name: Kayla Mack MRN: [id number] DOB: 1946/09/24    ADMISSION DATE:  05/04/2017 CONSULTATION DATE:  05/04/17  REFERRING MD :  Gwendlyn Deutscher  CHIEF COMPLAINT:  SOB   HISTORY OF PRESENT ILLNESS:  Heavy ex-smoker, quit 1 month ago, prior CT chest shows severe bullous emphysema Her mother is sick and admitted to [address] and she is the caregiver. Admitted with shortness of breath and cough productive green sputum for 5 days, being treated as COPD exacerbation, adm chest x-ray  right lower lobe infiltrate -there was acute respiratory failure with hypercarbia requiring continuous BiPAP and demand ischemia  SUBJECTIVE:  Only tolerates break on bipap x 55mAfebrile     VITAL SIGNS: Temp:  [97.4 F (36.3 C)-98 F (36.7 C)] 97.7 F (36.5 C) (07/22 0818) Pulse Rate:  [38-106] 95 (07/22 0818) Resp:  [11-26] 23 (07/22 0818) BP: (94-137)/(54-92) 127/79 (07/22 0739) SpO2:  [83 %-97 %] 91 % (07/22 0818) FiO2 (%):  [40 %] 40 % (07/22 0739) Weight:  [109 lb 5.6 oz (49.6 kg)] 109 lb 5.6 oz (49.6 kg) (07/22 0600)  PHYSICAL EXAMINATION: General:cachectic female in mild distress off bipap Neuro: Awake, alert, oriented.  HEENT: [place]/AT, PERRL, mild JVD PULM: wheeze improved.decreased BS bilaterally.  CV: RRR, no MRG GPV:VZSM non-tender, non-distended Extremities: no edema    Recent Labs Lab 05/06/17 0653 05/07/17 0307 05/08/17 0212  NA 135 136 137  K 4.9 4.7 4.2  CL 102 100* 100*  CO2 26 28 32  BUN _0 CREATININE 0.63 0.64 0.56  GLUCOSE 141* 146* 154*    Recent Labs Lab 05/06/17 0418 05/07/17 0307 05/08/17 0212  HGB 13.5 12.8 12.1  HCT 42.9 41.8 37.8  WBC 15.8* 14.4* 10.7*  PLT 256 229 217   Dg Chest Port 1 View  Result Date: 05/07/2017 CLINICAL DATA:  COPD.  Follow-up. EXAM: PORTABLE CHEST 1 VIEW COMPARISON:  05/06/2017 FINDINGS: Lungs are hyperexpanded with increased lucency/emphysematous change over the mid to upper lungs. No focal airspace consolidation or effusion.  Stable borderline cardiomegaly. Mild prominence of the central pulmonary arteries which could be due to pulmonary arterial hypertension. Calcified plaque over the thoracic aorta. Remainder of the exam is unchanged. IMPRESSION: No acute cardiopulmonary disease. Emphysema (ICD10-J43.9). Aortic Atherosclerosis (ICD10-I70.0). Electronically Signed   By: DMarin OlpM.D.   On: 05/07/2017 07:33   Dg Chest Port 1 View  Result Date: 05/06/2017 CLINICAL DATA:  Chest congestion and productive cough. History of COPD. EXAM: PORTABLE CHEST 1 VIEW COMPARISON:  05/05/2017 ; 03/21/2017 ; chest CT - 06/02/2013 FINDINGS: Grossly unchanged cardiac silhouette and mediastinal contours with atherosclerotic plaque within the thoracic aorta. The lungs remain hyperexpanded with flattening of the bilateral diaphragms and thinning of the biapical pulmonary parenchyma, right greater than left. No discrete focal airspace opacities. No pleural effusion or pneumothorax. No evidence of edema. No acute osseus abnormalities. IMPRESSION: 1. Hyperexpanded lungs without acute cardiopulmonary disease. 2. Aortic Atherosclerosis (ICD10-I70.0) and Emphysema (ICD10-J43.9). Electronically Signed   By: JSandi MariscalM.D.   On: 05/06/2017 12:13    STUDIES:  Echo 7/19 > LVEF 60-65%, no wall motion abnormalities.   SIGNIFICANT EVENTS  7/18 > admit. 7/19 > PCCM consult.  ASSESSMENT / PLAN:  Acute hypoxic respiratory failure - due to AECOPD. PNA RLL, resolving AECOPD - apparently told she has COPD just 2 months ago; however, no PFT's available in system. Tobacco dependence. Plan: Continue PRN BIPAP cycle daytime & mandatory q hs Maximize bronchodialtors (Budesonide, Brovana, scheduled atrovent/xopenex) Solumedrol  91m q8.  Empiric levaquin  Elevated troponin - suspect demand ischemia.  Has been started on heparin for possible ACS. Did have some ST depression during admission with tachycardia. No wall motion abnormalities on echo.  Severe  pulm htn Plan: Cardiology following Heparin infusion R and L heart cath pre-discharge  Hx HTN, HLD. Plan: Continue bisoprolol -cardioselective ct pravastatin.  Anxiety. Plan: Continue bupropion, klonopin.  Hyperglycemia Plan: add SSI if greater than 180.   My cc time [phone] MeadD. FCLafayette [facility] Pager [phone] no response call [phone]     05/08/2017 8:33 AM

## 2017-05-09 ENCOUNTER — Encounter (HOSPITAL_COMMUNITY)
Admission: EM | Disposition: A | Discharge status: Home Health | Payer: Self-pay | Source: Home / Self Care | Attending: Internal Medicine

## 2017-05-09 ENCOUNTER — Inpatient Hospital Stay (HOSPITAL_COMMUNITY): Payer: Medicare Other

## 2017-05-09 HISTORY — PX: RIGHT/LEFT HEART CATH AND CORONARY ANGIOGRAPHY: CATH118266

## 2017-05-09 LAB — POCT I-STAT 3, ART BLOOD GAS (G3+)
Acid-Base Excess: 10 mmol/L — ABNORMAL HIGH (ref 0.0–2.0)
Bicarbonate: 36.9 mmol/L — ABNORMAL HIGH (ref 20.0–28.0)
O2 Saturation: 90 %
TCO2: 39 mmol/L (ref 0–100)
pCO2 arterial: 56.6 mmHg — ABNORMAL HIGH (ref 32.0–48.0)
pH, Arterial: 7.423 (ref 7.350–7.450)
pO2, Arterial: 59 mmHg — ABNORMAL LOW (ref 83.0–108.0)

## 2017-05-09 LAB — CBC
HCT: 40.7 % (ref 36.0–46.0)
Hemoglobin: 12.6 g/dL (ref 12.0–15.0)
MCH: 30.1 pg (ref 26.0–34.0)
MCHC: 31 g/dL (ref 30.0–36.0)
MCV: 97.1 fL (ref 78.0–100.0)
Platelets: 221 10*3/uL (ref 150–400)
RBC: 4.19 MIL/uL (ref 3.87–5.11)
RDW: 14 % (ref 11.5–15.5)
WBC: 9.2 10*3/uL (ref 4.0–10.5)

## 2017-05-09 LAB — BASIC METABOLIC PANEL
Anion gap: 8 (ref 5–15)
BUN: 14 mg/dL (ref 6–20)
CO2: 34 mmol/L — ABNORMAL HIGH (ref 22–32)
Calcium: 8.6 mg/dL — ABNORMAL LOW (ref 8.9–10.3)
Chloride: 95 mmol/L — ABNORMAL LOW (ref 101–111)
Creatinine, Ser: 0.6 mg/dL (ref 0.44–1.00)
GFR calc Af Amer: >60 mL/min (ref >60)
GFR calc non Af Amer: >60 mL/min (ref >60)
Glucose, Bld: 154 mg/dL — ABNORMAL HIGH (ref 65–99)
Potassium: 4 mmol/L (ref 3.5–5.1)
Sodium: 137 mmol/L (ref 135–145)

## 2017-05-09 LAB — POCT I-STAT 3, VENOUS BLOOD GAS (G3P V)
Acid-Base Excess: 12 mmol/L — ABNORMAL HIGH (ref 0.0–2.0)
Bicarbonate: 39 mmol/L — ABNORMAL HIGH (ref 20.0–28.0)
O2 Saturation: 65 %
TCO2: 41 mmol/L (ref 0–100)
pCO2, Ven: 62.1 mmHg — ABNORMAL HIGH (ref 44.0–60.0)
pH, Ven: 7.406 (ref 7.250–7.430)
pO2, Ven: 35 mmHg (ref 32.0–45.0)

## 2017-05-09 LAB — HEPARIN LEVEL (UNFRACTIONATED): Heparin Unfractionated: 0.38 IU/mL (ref 0.30–0.70)

## 2017-05-09 LAB — PROTIME-INR
INR: 1.09
Prothrombin Time: 14.2 seconds (ref 11.4–15.2)

## 2017-05-09 LAB — POCT ACTIVATED CLOTTING TIME: Activated Clotting Time: 120 seconds

## 2017-05-09 SURGERY — RIGHT/LEFT HEART CATH AND CORONARY ANGIOGRAPHY
Anesthesia: LOCAL

## 2017-05-09 MED ORDER — IPRATROPIUM BROMIDE 0.02 % IN SOLN: 0.5000 mg | RESPIRATORY_TRACT | Status: DC | PRN

## 2017-05-09 MED ORDER — FENTANYL CITRATE (PF) 100 MCG/2ML IJ SOLN
INTRAMUSCULAR | Status: DC | PRN
  Administered 2017-05-09: 25 ug via INTRAVENOUS

## 2017-05-09 MED ORDER — HEPARIN (PORCINE) IN NACL 2-0.9 UNIT/ML-% IJ SOLN
INTRAMUSCULAR | Status: DC | PRN
Start: 2017-05-09 — End: 2017-05-09
  Administered 2017-05-09: 14:00:00

## 2017-05-09 MED ORDER — METHYLPREDNISOLONE SODIUM SUCC 40 MG IJ SOLR
40.0000 mg | Freq: Two times a day (BID) | INTRAMUSCULAR | Status: DC
  Administered 2017-05-09 – 2017-05-10 (×2): 40 mg via INTRAVENOUS
  Filled 2017-05-09 (×3): qty 1

## 2017-05-09 MED ORDER — IOPAMIDOL (ISOVUE-370) INJECTION 76%
INTRAVENOUS | Status: DC | PRN
  Administered 2017-05-09: 30 mL

## 2017-05-09 MED ORDER — IOPAMIDOL (ISOVUE-370) INJECTION 76%
INTRAVENOUS | Status: AC
  Filled 2017-05-09: qty 100

## 2017-05-09 MED ORDER — UMECLIDINIUM-VILANTEROL 62.5-25 MCG/INH IN AEPB
1.0000 | INHALATION_SPRAY | Freq: Every day | RESPIRATORY_TRACT | Status: DC
  Administered 2017-05-10 – 2017-05-13 (×4): 1 via RESPIRATORY_TRACT
  Filled 2017-05-09: qty 14

## 2017-05-09 MED ORDER — SODIUM CHLORIDE 0.9 % WEIGHT BASED INFUSION: 1.0000 mL/kg/h | INTRAVENOUS | Status: AC

## 2017-05-09 MED ORDER — SODIUM CHLORIDE 0.9 % IV SOLN: 250.0000 mL | INTRAVENOUS | Status: DC | PRN

## 2017-05-09 MED ORDER — MIDAZOLAM HCL 2 MG/2ML IJ SOLN
INTRAMUSCULAR | Status: DC | PRN
  Administered 2017-05-09: 1 mg via INTRAVENOUS

## 2017-05-09 MED ORDER — HEPARIN (PORCINE) IN NACL 2-0.9 UNIT/ML-% IJ SOLN
INTRAMUSCULAR | Status: AC
  Filled 2017-05-09: qty 1000

## 2017-05-09 MED ORDER — LEVALBUTEROL HCL 0.63 MG/3ML IN NEBU: 0.6300 mg | INHALATION_SOLUTION | RESPIRATORY_TRACT | Status: DC | PRN

## 2017-05-09 MED ORDER — SODIUM CHLORIDE 0.9% FLUSH: 3.0000 mL | INTRAVENOUS | Status: DC | PRN

## 2017-05-09 MED ORDER — MIDAZOLAM HCL 2 MG/2ML IJ SOLN
INTRAMUSCULAR | Status: AC
  Filled 2017-05-09: qty 2

## 2017-05-09 MED ORDER — SODIUM CHLORIDE 0.9% FLUSH
3.0000 mL | Freq: Two times a day (BID) | INTRAVENOUS | Status: DC
  Administered 2017-05-10 – 2017-05-12 (×6): 3 mL via INTRAVENOUS

## 2017-05-09 MED ORDER — LIDOCAINE HCL (PF) 1 % IJ SOLN
INTRAMUSCULAR | Status: AC
  Filled 2017-05-09: qty 30

## 2017-05-09 MED ORDER — LIDOCAINE HCL (PF) 1 % IJ SOLN
INTRAMUSCULAR | Status: DC | PRN
  Administered 2017-05-09: 15 mL

## 2017-05-09 MED ORDER — FENTANYL CITRATE (PF) 100 MCG/2ML IJ SOLN
INTRAMUSCULAR | Status: AC
  Filled 2017-05-09: qty 2

## 2017-05-09 SURGICAL SUPPLY — 11 items
CATH 5FR JL3.5 JR4 ANG PIG MP (CATHETERS) ×2 IMPLANT
CATH SWAN GANZ 7F STRAIGHT (CATHETERS) ×4 IMPLANT
COVER PRB 48X5XTLSCP FOLD TPE (BAG) IMPLANT
COVER PROBE 5X48 (BAG) ×2
KIT HEART LEFT (KITS) ×2 IMPLANT
PACK CARDIAC CATHETERIZATION (CUSTOM PROCEDURE TRAY) ×2 IMPLANT
SHEATH PINNACLE 5F 10CM (SHEATH) ×1 IMPLANT
SHEATH PINNACLE 7F 10CM (SHEATH) ×1 IMPLANT
TRANSDUCER W/STOPCOCK (MISCELLANEOUS) ×2 IMPLANT
TUBING CIL FLEX 10 FLL-RA (TUBING) ×2 IMPLANT
WIRE EMERALD 3MM-J .035X150CM (WIRE) ×2 IMPLANT

## 2017-05-09 NOTE — Progress Notes (Signed)
Saw and examined Kayla Mack this morning.  She appears comfortable on 5L O2 via [place].  She says that the new mask for BiPAP was much more comfortable for her overnight.  She denies current shortness of breath but has not been out of bed much to test her ability to breathe during activity.  She is a little nervous about her catheterization today and wants her friend and her mother to be informed of the results.  On exam, she has prolonged expiratory wheezing, but this is improved from previous lung exams.  We will continue to follow socially and appreciate the excellent care provided by [facility].  We will be happy to resume care if she is transferred to our service from [facility].

## 2017-05-09 NOTE — Progress Notes (Signed)
Occupational Therapy Treatment Patient Details Name: Kayla Mack MRN: [id number] DOB: August 15, 1946 Today's Date: 05/09/2017    History of present illness 71 y.o. female with a hx of HTN, HLD, COPD, + tobacco use and anxiety who is being seen for the evaluation of abnormal EKG and abnormal cardiac enzymes in the face of acute exacerbation of chronic lung disease.    OT comments  Pt on 6L 02, maintaining saturations in the 90s until coughing after rinsing her mouth during oral care, then dropping into mid 90s with HR to 120. Pt transferred to chair and performed 3 grooming activities. Pt concerned about her mother going home without help. Assisted pt in finding a plug for her cell phone so she could reach her niece. Pt for cardiac cath this pm. Will continue to follow.  Follow Up Recommendations  SNF    Equipment Recommendations  None recommended by OT    Recommendations for Other Services      Precautions / Restrictions Precautions Precautions: Fall Precaution Comments: watch 02 sats       Mobility Bed Mobility Overal bed mobility: Independent                Transfers Overall transfer level: Needs assistance Equipment used: 1 person hand held assist Transfers: Sit to/from Stand;Stand Pivot Transfers Sit to Stand: Supervision Stand pivot transfers: Min assist       General transfer comment: hand held assist     Balance Overall balance assessment: Needs assistance   Sitting balance-Leahy Scale: Fair       Standing balance-Leahy Scale: Poor Standing balance comment: seeks stabilization on surfaces                           ADL either performed or assessed with clinical judgement   ADL Overall ADL's : Needs assistance/impaired     Grooming: Wash/dry hands;Wash/dry face;Oral care;Brushing hair;Sitting;Minimal assistance Grooming Details (indicate cue type and reason): pt with episode of coughing after rinsing her mouth, RN notified          Upper Body Dressing : Set up;Sitting                           Vision       Perception     Praxis      Cognition Arousal/Alertness: Awake/alert Behavior During Therapy: WFL for tasks assessed/performed Overall Cognitive Status: Within Functional Limits for tasks assessed                                          Exercises     Shoulder Instructions       General Comments      Pertinent Vitals/ Pain       Pain Assessment: No/denies pain  Home Living                                          Prior Functioning/Environment              Frequency  Min 2X/week        Progress Toward Goals  OT Goals(current goals can now be found in the care plan section)  Progress towards OT goals: Progressing toward goals  Acute Rehab OT  Goals Patient Stated Goal: to go home and care for her mother OT Goal Formulation: With patient Time For Goal Achievement: 05/13/17 Potential to Achieve Goals: Good  Plan Discharge plan remains appropriate    Co-evaluation                 AM-PAC PT "6 Clicks" Daily Activity     Outcome Measure   Help from another person eating meals?: None Help from another person taking care of personal grooming?: A Little Help from another person toileting, which includes using toliet, bedpan, or urinal?: A Little Help from another person bathing (including washing, rinsing, drying)?: A Little Help from another person to put on and taking off regular upper body clothing?: None Help from another person to put on and taking off regular lower body clothing?: A Little 6 Click Score: 20    End of Session Equipment Utilized During Treatment: Gait belt;Oxygen (6L)  OT Visit Diagnosis: Unsteadiness on feet (R26.81);Muscle weakness (generalized) (M62.81);Other abnormalities of gait and mobility (R26.89);Other (comment) (decreased activity tolerance)   Activity Tolerance Patient tolerated treatment well  (coughing)   Patient Left in chair;with call bell/phone within reach;with nursing/sitter in room   Nurse Communication  (aware of coughing after rinsing mouth)        Time: 3149-7026 OT Time Calculation (min): 35 min  Charges: OT General Charges $OT Visit: 1 Procedure OT Treatments $Self Care/Home Management : 23-37 mins    Malka So 05/09/2017, 9:54 AM  [phone]

## 2017-05-09 NOTE — H&P (View-Only) (Signed)
 Progress Note  Patient Name: Kayla Mack Date of Encounter: 05/09/2017  Primary Cardiologist: Dr. Hochrein  Subjective   Feeling much better. Breathing has improved. She denies chest pain. She is concerned about her elderly mother who is home alone. She is a primary caregiver.  Inpatient Medications    Scheduled Meds: . arformoterol  15 mcg Nebulization BID  . bisoprolol  5 mg Oral Daily  . budesonide (PULMICORT) nebulizer solution  0.5 mg Nebulization BID  . buPROPion  150 mg Oral Daily  . feeding supplement (ENSURE ENLIVE)  237 mL Oral BID BM  . ipratropium  0.5 mg Nebulization QID  . levalbuterol  0.63 mg Nebulization Q4H WA  . methylPREDNISolone (SOLU-MEDROL) injection  60 mg Intravenous Q8H  . PARoxetine  20 mg Oral Daily  . pravastatin  20 mg Oral q1800  . sodium chloride flush  3 mL Intravenous Q12H   Continuous Infusions: . sodium chloride    . sodium chloride 1 mL/kg/hr (05/09/17 0755)  . heparin 850 Units/hr (05/09/17 0755)  . levofloxacin (LEVAQUIN) IV Stopped (05/08/17 1252)   PRN Meds: sodium chloride, acetaminophen **OR** acetaminophen, clonazePAM, sodium chloride flush   Vital Signs    Vitals:   05/09/17 0408 05/09/17 0500 05/09/17 0600 05/09/17 0752  BP:  (!) 142/75 137/69   Pulse:  99 87   Resp:  (!) 31 17   Temp: (!) 97.3 F (36.3 C)   98.1 F (36.7 C)  TempSrc: Oral   Oral  SpO2:  94% 91%   Weight:  50.3 kg (110 lb 14.3 oz)    Height:        Intake/Output Summary (Last 24 hours) at 05/09/17 0818 Last data filed at 05/09/17 0755  Gross per 24 hour  Intake           877.73 ml  Output             2400 ml  Net         -1522.27 ml   Filed Weights   05/07/17 0437 05/08/17 0600 05/09/17 0500  Weight: 50.7 kg (111 lb 12.4 oz) 49.6 kg (109 lb 5.6 oz) 50.3 kg (110 lb 14.3 oz)    Telemetry    Sinus rhythm and sinus tachycardia. 6 beat run of NSVT. Frequent PACs and occasional PVCs.  PAT. - Personally Reviewed  ECG    n/a -  Personally Reviewed  Physical Exam   GEN: Frail, elderly woman in no acute distress.  Neck: No JVD Cardiac: RRR.occasional ectopy.   No murmurs, rubs, or gallops.  Respiratory:  mild scattered expiratory wheezes. No crackles or rhonchi.  GI: Soft, nontender, non-distended  MS: No edema; No deformity. Neuro:  Nonfocal  Psych: Normal affect   Labs    Chemistry Recent Labs Lab 05/07/17 0307 05/08/17 0212 05/09/17 0144  NA 136 137 137  K 4.7 4.2 4.0  CL 100* 100* 95*  CO2 28 32 34*  GLUCOSE 146* 154* 154*  BUN 13 20 14  CREATININE 0.64 0.56 0.60  CALCIUM 8.6* 8.4* 8.6*  PROT 5.7*  --   --   ALBUMIN 3.2*  --   --   AST 29  --   --   ALT 36  --   --   ALKPHOS 62  --   --   BILITOT 0.6  --   --   GFRNONAA >60 >60 >60  GFRAA >60 >60 >60  ANIONGAP 8 5 8     Hematology Recent   Labs Lab 05/07/17 0307 05/08/17 0212 05/09/17 0144  WBC 14.4* 10.7* 9.2  RBC 4.20 3.88 4.19  HGB 12.8 12.1 12.6  HCT 41.8 37.8 40.7  MCV 99.5 97.4 97.1  MCH 30.5 31.2 30.1  MCHC 30.6 32.0 31.0  RDW 14.3 14.1 14.0  PLT 229 217 221    Cardiac Enzymes Recent Labs Lab 05/04/17 1050 05/04/17 1641 05/04/17 2250 05/05/17 0710  TROPONINI 0.08* 0.07* 0.05* 0.14*    Recent Labs Lab 05/04/17 0806  TROPIPOC 0.04     BNPNo results for input(s): BNP, PROBNP in the last 168 hours.   DDimer  Recent Labs Lab 05/05/17 1054  DDIMER 0.39     Radiology    Dg Chest Port 1 View  Result Date: 05/09/2017 CLINICAL DATA:  Acute respiratory failure. EXAM: PORTABLE CHEST 1 VIEW COMPARISON:  05/07/2017. FINDINGS: Mediastinum and hilar structures are stable. Heart size stable. Stable mild bilateral interstitial prominence, most likely chronic. Very mild active process including pneumonitis or interstitial edema cannot be excluded. COPD. No pleural effusion identified. Left costophrenic angle not imaged. No pneumothorax. Surgical screw right shoulder . IMPRESSION: 1. Stable mild bilateral interstitial  prominence, most likely chronic. Very mild active process including pneumonitis or interstitial edema cannot be excluded. 2. COPD . Electronically Signed   By: Thomas  Register   On: 05/09/2017 06:52    Cardiac Studies   Echo 05/05/17: Study Conclusions  - Left ventricle: The cavity size was normal. There was mild concentric hypertrophy. Systolic function was normal. The estimated ejection fraction was in the range of 60% to 65%. Wall motion was normal; there were no regional wall motion abnormalities. - Aortic valve: Transvalvular velocity was within the normal range. There was no stenosis. There was no regurgitation. - Mitral valve: Transvalvular velocity was within the normal range. There was no evidence for stenosis. There was mild regurgitation. - Right ventricle: The cavity size was normal. Wall thickness was normal. Systolic function was normal. - Tricuspid valve: There was trivial regurgitation. - Pulmonary arteries: Systolic pressure was severely increased. PA peak pressure: 63 mm Hg (S). - Pericardium, extracardiac: Minimal ascites was noted.   Patient Profile     71 y.o. female with COPD, severe pulmonary hypertension, hypertension, hyperlipidemia, tobacco abuse and anxiety here with COPD exacerbation and elevated cardiac enzymes.  Assessment & Plan    # Elevated troponin: Peak 0.14.  Echo showed normal systolic function and severely elevated pulmonary pressure.  Continue bisoprolol.  Will be able to stop heparin after cath.  She has an allergy to aspirin.  # Severe pulmonary hypertension: PASP 63 mmHg on echo.  Likely 2/2 COPD.  RHC today.  She is euvolemic.  # COPD exacerbation: Now off BiPAP.  She is getting levofloxacin and methylprednisolone.  She needs cardioselective beta blockers.  # Hypertension: BP controlled on bisolprolol.  She was on atenolol at home.  She has frequent ectopy, so continue beta blocker.    # Hyperlipidemia: Continue  lovastatin.   # PVCs: # PACs: # PAT: # NSVT: Frequent ectopy noted on telemetry.  She is asymptomatic.  K is >4 and TSH was normal 05/04/17. Plan for left heart cath today as above. This is likely attributable to her lung disease. Plan for LHC today as above.  Normal LVEF on echo.  Signed, Destyni Hoppel [place], MD  05/09/2017, 8:18 AM    

## 2017-05-09 NOTE — Progress Notes (Signed)
Progress Note  Patient Name: Kayla Mack Date of Encounter: 05/09/2017  Primary Cardiologist: Dr. Percival Spanish  Subjective   Feeling much better. Breathing has improved. She denies chest pain. She is concerned about her elderly mother who is home alone. She is a primary caregiver.  Inpatient Medications    Scheduled Meds: . arformoterol  15 mcg Nebulization BID  . bisoprolol  5 mg Oral Daily  . budesonide (PULMICORT) nebulizer solution  0.5 mg Nebulization BID  . buPROPion  150 mg Oral Daily  . feeding supplement (ENSURE ENLIVE)  237 mL Oral BID BM  . ipratropium  0.5 mg Nebulization QID  . levalbuterol  0.63 mg Nebulization Q4H WA  . methylPREDNISolone (SOLU-MEDROL) injection  60 mg Intravenous Q8H  . PARoxetine  20 mg Oral Daily  . pravastatin  20 mg Oral q1800  . sodium chloride flush  3 mL Intravenous Q12H   Continuous Infusions: . sodium chloride    . sodium chloride 1 mL/kg/hr (05/09/17 0755)  . heparin 850 Units/hr (05/09/17 0755)  . levofloxacin (LEVAQUIN) IV Stopped (05/08/17 1252)   PRN Meds: sodium chloride, acetaminophen **OR** acetaminophen, clonazePAM, sodium chloride flush   Vital Signs    Vitals:   05/09/17 0408 05/09/17 0500 05/09/17 0600 05/09/17 0752  BP:  (!) 142/75 137/69   Pulse:  99 87   Resp:  (!) 31 17   Temp: (!) 97.3 F (36.3 C)   98.1 F (36.7 C)  TempSrc: Oral   Oral  SpO2:  94% 91%   Weight:  50.3 kg (110 lb 14.3 oz)    Height:        Intake/Output Summary (Last 24 hours) at 05/09/17 0818 Last data filed at 05/09/17 0755  Gross per 24 hour  Intake           877.73 ml  Output             2400 ml  Net         -1522.27 ml   Filed Weights   05/07/17 0437 05/08/17 0600 05/09/17 0500  Weight: 50.7 kg (111 lb 12.4 oz) 49.6 kg (109 lb 5.6 oz) 50.3 kg (110 lb 14.3 oz)    Telemetry    Sinus rhythm and sinus tachycardia. 6 beat run of NSVT. Frequent PACs and occasional PVCs.  PAT. - Personally Reviewed  ECG    n/a -  Personally Reviewed  Physical Exam   GEN: Frail, elderly woman in no acute distress.  Neck: No JVD Cardiac: RRR.occasional ectopy.   No murmurs, rubs, or gallops.  Respiratory:  mild scattered expiratory wheezes. No crackles or rhonchi.  GI: Soft, nontender, non-distended  MS: No edema; No deformity. Neuro:  Nonfocal  Psych: Normal affect   Labs    Chemistry Recent Labs Lab 05/07/17 0307 05/08/17 0212 05/09/17 0144  NA 136 137 137  K 4.7 4.2 4.0  CL 100* 100* 95*  CO2 28 32 34*  GLUCOSE 146* 154* 154*  BUN _0 CREATININE 0.64 0.56 0.60  CALCIUM 8.6* 8.4* 8.6*  PROT 5.7*  --   --   ALBUMIN 3.2*  --   --   AST 29  --   --   ALT 36  --   --   ALKPHOS 62  --   --   BILITOT 0.6  --   --   GFRNONAA >60 >60 >60  GFRAA >60 >60 >60  ANIONGAP _1 Hematology Recent  Labs Lab 05/07/17 0307 05/08/17 0212 05/09/17 0144  WBC 14.4* 10.7* 9.2  RBC 4.20 3.88 4.19  HGB 12.8 12.1 12.6  HCT 41.8 37.8 40.7  MCV 99.5 97.4 97.1  MCH 30.5 31.2 30.1  MCHC 30.6 32.0 31.0  RDW 14.3 14.1 14.0  PLT 229 217 221    Cardiac Enzymes Recent Labs Lab 05/04/17 1050 05/04/17 1641 05/04/17 2250 05/05/17 0710  TROPONINI 0.08* 0.07* 0.05* 0.14*    Recent Labs Lab 05/04/17 0806  TROPIPOC 0.04     BNPNo results for input(s): BNP, PROBNP in the last 168 hours.   DDimer  Recent Labs Lab 05/05/17 1054  DDIMER 0.39     Radiology    Dg Chest Port 1 View  Result Date: 05/09/2017 CLINICAL DATA:  Acute respiratory failure. EXAM: PORTABLE CHEST 1 VIEW COMPARISON:  05/07/2017. FINDINGS: Mediastinum and hilar structures are stable. Heart size stable. Stable mild bilateral interstitial prominence, most likely chronic. Very mild active process including pneumonitis or interstitial edema cannot be excluded. COPD. No pleural effusion identified. Left costophrenic angle not imaged. No pneumothorax. Surgical screw right shoulder . IMPRESSION: 1. Stable mild bilateral interstitial  prominence, most likely chronic. Very mild active process including pneumonitis or interstitial edema cannot be excluded. 2. COPD . Electronically Signed   By: Marcello Moores  Register   On: 05/09/2017 06:52    Cardiac Studies   Echo 05/05/17: Study Conclusions  - Left ventricle: The cavity size was normal. There was mild concentric hypertrophy. Systolic function was normal. The estimated ejection fraction was in the range of 60% to 65%. Wall motion was normal; there were no regional wall motion abnormalities. - Aortic valve: Transvalvular velocity was within the normal range. There was no stenosis. There was no regurgitation. - Mitral valve: Transvalvular velocity was within the normal range. There was no evidence for stenosis. There was mild regurgitation. - Right ventricle: The cavity size was normal. Wall thickness was normal. Systolic function was normal. - Tricuspid valve: There was trivial regurgitation. - Pulmonary arteries: Systolic pressure was severely increased. PA peak pressure: 63 mm Hg (S). - Pericardium, extracardiac: Minimal ascites was noted.   Patient Profile     71 y.o. female with COPD, severe pulmonary hypertension, hypertension, hyperlipidemia, tobacco abuse and anxiety here with COPD exacerbation and elevated cardiac enzymes.  Assessment & Plan    # Elevated troponin: Peak 0.14.  Echo showed normal systolic function and severely elevated pulmonary pressure.  Continue bisoprolol.  Will be able to stop heparin after cath.  She has an allergy to aspirin.  # Severe pulmonary hypertension: PASP 63 mmHg on echo.  Likely 2/2 COPD.  RHC today.  She is euvolemic.  # COPD exacerbation: Now off BiPAP.  She is getting levofloxacin and methylprednisolone.  She needs cardioselective beta blockers.  # Hypertension: BP controlled on bisolprolol.  She was on atenolol at home.  She has frequent ectopy, so continue beta blocker.    # Hyperlipidemia: Continue  lovastatin.   # PVCs: # PACs: # PAT: # NSVT: Frequent ectopy noted on telemetry.  She is asymptomatic.  K is >4 and TSH was normal 05/04/17. Plan for left heart cath today as above. This is likely attributable to her lung disease. Plan for [facility] today as above.  Normal LVEF on echo.  Signed, Skeet Latch, MD  05/09/2017, 8:18 AM

## 2017-05-09 NOTE — Progress Notes (Signed)
CPT not completed at this time per pt request following her recent procedure in cath lab.

## 2017-05-09 NOTE — NC FL2 (Signed)
[place] LEVEL OF CARE SCREENING TOOL     IDENTIFICATION  Patient Name: Kayla Mack Birthdate: 06/30/1946 Sex: female Admission Date (Current Location): 05/04/2017  [facility] and [place] Number:  Herbalist and Address:  [facility]. [facility], [place] [address] [place], [place] [address]      Provider Number: [id number]  Attending Physician Name and Address:  Raylene Miyamoto, MD  Relative Name and Phone Number:       Current Level of Care: Hospital Recommended Level of Care: [place] Prior Approval Number:    Date Approved/Denied:   PASRR Number:   [id number]  Discharge Plan: SNF    Current Diagnoses: Patient Active Problem List   Diagnosis Date Noted  . Acute respiratory failure with hypercapnia ([place])   . Pulmonary hypertension ([place])   . Acute exacerbation of emphysema ([place])   . SOB (shortness of breath)   . HCAP (healthcare-associated pneumonia)   . Hyperkalemia   . Elevated troponin   . COPD exacerbation ([place]) 05/04/2017  . Acute respiratory distress   . Tachycardia   . Hypokalemia   . Hypotension   . Tobacco use disorder 06/04/2013  . T12 burst fracture ([place]) 06/03/2013  . Left radius fracture 06/03/2013  . Right calcaneal fracture 06/03/2013  . Fall from ladder 06/03/2013  . Osteopenia of the elderly 06/03/2013    Orientation RESPIRATION BLADDER Height & Weight     Self, Time, Situation, Place  O2 (5L ) Continent Weight: 110 lb 14.3 oz (50.3 kg) Height:  5' 2" (157.5 cm)  BEHAVIORAL SYMPTOMS/MOOD NEUROLOGICAL BOWEL NUTRITION STATUS      Continent Diet (Regualr, sips with meals.)  AMBULATORY STATUS COMMUNICATION OF NEEDS Skin   Independent Verbally Normal                       Personal Care Assistance Level of Assistance  Bathing, Feeding, Dressing Bathing Assistance: Limited assistance Feeding assistance: Independent Dressing Assistance: Limited assistance     Functional  Limitations Info  Sight, Hearing, Speech Sight Info: Adequate Hearing Info: Adequate Speech Info: Adequate    SPECIAL CARE FACTORS FREQUENCY  PT (By licensed PT), OT (By licensed OT)     PT Frequency:  3 times a week  OT Frequency:  2 times a week.             Contractures Contractures Info: Not present    Additional Factors Info  Code Status, Allergies Code Status Info: Full Allergies Info: Aleve Naproxen Sodium, Aspirin, Penicillins           Current Medications (05/09/2017):  This is the current hospital active medication list Current Facility-Administered Medications  Medication Dose Route Frequency Provider Last Rate Last Dose  . 0.9 %  sodium chloride infusion  250 mL Intravenous PRN Reino Bellis B, NP      . 0.9% sodium chloride infusion  1 mL/kg/hr Intravenous Continuous Reino Bellis B, NP 49.6 mL/hr at 05/09/17 0755 1 mL/kg/hr at 05/09/17 0755  . acetaminophen (TYLENOL) tablet 650 mg  650 mg Oral Q6H PRN Tonette Bihari, MD   650 mg at 05/06/17 6433   Or  . acetaminophen (TYLENOL) suppository 650 mg  650 mg Rectal Q6H PRN Tonette Bihari, MD      . arformoterol (BROVANA) nebulizer solution 15 mcg  15 mcg Nebulization BID Shearon Stalls, Rahul P, PA-C   15 mcg at 05/09/17 0817  . bisoprolol (ZEBETA) tablet 5 mg  5 mg Oral Daily Minus Breeding, MD   5 mg at 05/08/17 1108  . budesonide (PULMICORT) nebulizer solution 0.5 mg  0.5 mg Nebulization BID Flora Lipps, MD   0.5 mg at 05/09/17 0817  . buPROPion [facility] SR) 12 hr tablet 150 mg  150 mg Oral Daily Tonette Bihari, MD   150 mg at 05/08/17 1107  . clonazePAM (KLONOPIN) tablet 0.5 mg  0.5 mg Oral Daily PRN Tonette Bihari, MD   0.5 mg at 05/08/17 2350  . feeding supplement (ENSURE ENLIVE) (ENSURE ENLIVE) liquid 237 mL  237 mL Oral BID BM Eniola, Kehinde T, MD   237 mL at 05/08/17 1430  . heparin ADULT infusion 100 units/mL (25000 units/225m sodium chloride 0.45%)  850 Units/hr Intravenous  Continuous IIsaiah Serge NP 8.5 mL/hr at 05/09/17 0755 850 Units/hr at 05/09/17 0755  . ipratropium (ATROVENT) nebulizer solution 0.5 mg  0.5 mg Nebulization QID ARigoberto Noel MD   0.5 mg at 05/09/17 0817  . levalbuterol (XOPENEX) nebulizer solution 0.63 mg  0.63 mg Nebulization Q4H WA AKara MeadV, MD   0.63 mg at 05/09/17 0817  . levofloxacin (LEVAQUIN) IVPB 750 mg  750 mg Intravenous Q48H ARigoberto Noel MD   Stopped at 05/08/17 1252  . methylPREDNISolone sodium succinate (SOLU-MEDROL) 125 mg/2 mL injection 60 mg  60 mg Intravenous Q8H ARigoberto Noel MD   60 mg at 05/09/17 01901 . PARoxetine (PAXIL) tablet 20 mg  20 mg Oral Daily MTonette Bihari MD   20 mg at 05/08/17 1108  . pravastatin (PRAVACHOL) tablet 20 mg  20 mg Oral q1800 MTonette Bihari MD   20 mg at 05/08/17 1839  . sodium chloride flush (NS) 0.9 % injection 3 mL  3 mL Intravenous Q12H RReino BellisB, NP   3 mL at 05/08/17 2221  . sodium chloride flush (NS) 0.9 % injection 3 mL  3 mL Intravenous PRN RCheryln Manly NP         Discharge Medications: Please see discharge summary for a list of discharge medications.  Relevant Imaging Results:  Relevant Lab Results:   Additional Information SSN- [id number] [name] LCSWA

## 2017-05-09 NOTE — Progress Notes (Addendum)
Physical Therapy Treatment Patient Details Name: Kayla Mack MRN: [id number] DOB: Oct 07, 1946 Today's Date: 05/09/2017    History of Present Illness 71 y.o. female with a hx of HTN, HLD, COPD, + tobacco use and anxiety who is being seen for the evaluation of abnormal EKG and abnormal cardiac enzymes in the face of acute exacerbation of chronic lung disease. PLan for cardiac cath 7/23.    PT Comments    Patient progressing slowly towards PT goals. Improved ambulation distance to 110' but continues to demonstrate decreased endurance and drop in Sp02 to 80% during activity. Requires max cues for pursed lip breathing throughout activity with 2 forced standing rest breaks. Pt eager to mobilize. Instructed pt to perform IS- able to get to 1100. Encouraged ambulation to bathroom with RN. Will follow and progress as tolerated. Plan for cardiac cath today.     Follow Up Recommendations  SNF;Supervision/Assistance - 24 hour     Equipment Recommendations  Other (comment)    Recommendations for Other Services       Precautions / Restrictions Precautions Precautions: Fall Precaution Comments: watch 02 sats Restrictions Weight Bearing Restrictions: No    Mobility  Bed Mobility Overal bed mobility: Independent             General bed mobility comments: Pt up in chair upon PT arrival.   Transfers Overall transfer level: Needs assistance Equipment used: 1 person hand held assist Transfers: Sit to/from Stand Sit to Stand: Supervision Stand pivot transfers: Min assist       General transfer comment: Supervision for safety, RW in front of pt for support initially.   Ambulation/Gait Ambulation/Gait assistance: Min assist Ambulation Distance (Feet): 115 Feet Assistive device: None Gait Pattern/deviations: Step-through pattern;Decreased stride length;Trunk flexed;Wide base of support;Drifts right/left;Shuffle Gait velocity: decreased Gait velocity interpretation: Below normal  speed for age/gender General Gait Details: Slow, mildly unsteady gait with staggering at times; requires max cues for pursed lip breathing. 3/4 DOE. SP02 dropped to 80% on 6L/min 02. 2 standing rest breaks.    Stairs            Wheelchair Mobility    Modified Rankin (Stroke Patients Only)       Balance Overall balance assessment: Needs assistance Sitting-balance support: No upper extremity supported;Feet supported Sitting balance-Leahy Scale: Good     Standing balance support: During functional activity Standing balance-Leahy Scale: Fair Standing balance comment: Able to stand without UE support, mild instability noted with dynamic standing.                            Cognition Arousal/Alertness: Awake/alert Behavior During Therapy: WFL for tasks assessed/performed Overall Cognitive Status: Within Functional Limits for tasks assessed                                        Exercises      General Comments General comments (skin integrity, edema, etc.): Sp02 drops with talking to high 80s.      Pertinent Vitals/Pain Pain Assessment: No/denies pain    Home Living                      Prior Function            PT Goals (current goals can now be found in the care plan section) Acute Rehab PT Goals Patient Stated Goal:  to go home and care for her mother Progress towards PT goals: Progressing toward goals    Frequency    Min 3X/week      PT Plan Current plan remains appropriate    Co-evaluation              AM-PAC PT "6 Clicks" Daily Activity  Outcome Measure  Difficulty turning over in bed (including adjusting bedclothes, sheets and blankets)?: None Difficulty moving from lying on back to sitting on the side of the bed? : None Difficulty sitting down on and standing up from a chair with arms (e.g., wheelchair, bedside commode, etc,.)?: None Help needed moving to and from a bed to chair (including a  wheelchair)?: A Little Help needed walking in hospital room?: A Little Help needed climbing 3-5 steps with a railing? : A Lot 6 Click Score: 20    End of Session Equipment Utilized During Treatment: Gait belt;Oxygen Activity Tolerance: Patient limited by fatigue Patient left: in chair;with call bell/phone within reach Nurse Communication: Mobility status PT Visit Diagnosis: Muscle weakness (generalized) (M62.81);Other (comment);Difficulty in walking, not elsewhere classified (R26.2) (decreased endurance)     Time: 3291-9166 PT Time Calculation (min) (ACUTE ONLY): 19 min  Charges:  $Gait Training: 8-22 mins                    G Codes:       [name], PT, DPT [phone]     [name] A [place] 05/09/2017, 10:25 AM

## 2017-05-09 NOTE — Progress Notes (Signed)
Name: Kayla Mack MRN: [id number] DOB: 28-Dec-1945    ADMISSION DATE:  05/04/2017 CONSULTATION DATE:  05/04/17  REFERRING MD :  Gwendlyn Deutscher  CHIEF COMPLAINT:  SOB   HISTORY OF PRESENT ILLNESS:  Heavy ex-smoker, quit 1 month ago, prior CT chest shows severe bullous emphysema Her mother is sick and admitted to [address] and she is the primary caregiver. Admitted with shortness of breath and cough productive green sputum for 5 days, being treated as COPD exacerbation, adm chest x-ray  right lower lobe infiltrate -there was acute respiratory failure with hypercarbia requiring continuous BiPAP and demand ischemia  SUBJECTIVE:  Off BiPAP since 0800,  now on 4L [place] Reports minimal SOB with talking/ at rest Scheduled for RHC/LHC today Afebrile Reports sputum now thick white w/cough   VITAL SIGNS: Temp:  [97.3 F (36.3 C)-98.9 F (37.2 C)] 98.1 F (36.7 C) (07/23 0752) Pulse Rate:  [77-102] 84 (07/23 0818) Resp:  [9-32] 21 (07/23 0818) BP: (101-142)/(62-88) 101/75 (07/23 0818) SpO2:  [91 %-99 %] 95 % (07/23 0818) FiO2 (%):  [40 %] 40 % (07/23 0318) Weight:  [110 lb 14.3 oz (50.3 kg)] 110 lb 14.3 oz (50.3 kg) (07/23 0500)  PHYSICAL EXAMINATION: General:  Thin female sitting in bedside chair in NAD HEENT: [place]/AT, MM pink/moist Neuro: AOx3, non-focal CV: SR 83, occ ectopy, no murmur PULM: mild WOB, mild prolonged exp w/faint wheeze, decreased BS bilaterally, on 4L [place] at 94% GI: soft, non-tender, bs active  Extremities: warm/dry, no edema  Skin: no rashes or lesions   Recent Labs Lab 05/07/17 0307 05/08/17 0212 05/09/17 0144  NA 136 137 137  K 4.7 4.2 4.0  CL 100* 100* 95*  CO2 28 32 34*  BUN _0 CREATININE 0.64 0.56 0.60  GLUCOSE 146* 154* 154*    Recent Labs Lab 05/07/17 0307 05/08/17 0212 05/09/17 0144  HGB 12.8 12.1 12.6  HCT 41.8 37.8 40.7  WBC 14.4* 10.7* 9.2  PLT 229 217 221   Dg Chest Port 1 View  Result Date: 05/09/2017 CLINICAL DATA:  Acute respiratory  failure. EXAM: PORTABLE CHEST 1 VIEW COMPARISON:  05/07/2017. FINDINGS: Mediastinum and hilar structures are stable. Heart size stable. Stable mild bilateral interstitial prominence, most likely chronic. Very mild active process including pneumonitis or interstitial edema cannot be excluded. COPD. No pleural effusion identified. Left costophrenic angle not imaged. No pneumothorax. Surgical screw right shoulder . IMPRESSION: 1. Stable mild bilateral interstitial prominence, most likely chronic. Very mild active process including pneumonitis or interstitial edema cannot be excluded. 2. COPD . Electronically Signed   By: Marcello Moores  Register   On: 05/09/2017 06:52    STUDIES:  Echo 7/19 > LVEF 60-65%, no wall motion abnormalities.   SIGNIFICANT EVENTS  7/18 > admit. 7/19 > PCCM consult.  ABX:  7/18 Levaquin >   ASSESSMENT / PLAN:  Acute hypoxic respiratory failure - due to AECOPD. PNA RLL, resolving AECOPD - apparently told she has COPD just 2 months ago; however, no PFT's available in system. Tobacco dependence - leukocytosis resolved and afebrile - sputum cx neg 7/18 Plan: Continue to wean BIPAP PRN  Wean O2 for 88-92% Maximize bronchodialtors (Budesonide, Brovana, scheduled atrovent/xopenex) Solumedrol 78m q8 -> decrease to 40 mg q 12 Empiric levaquin, day 5-> 7/24, then stop Pulmonary hygiene with IS/ mobilize as able Ambulatory walk test prior to discharge to determine home O2 needs Will need pulmonary followup as outpatient   Elevated troponin - suspect demand ischemia.  Has been  started on heparin for possible ACS. Did have some ST depression during admission with tachycardia. No wall motion abnormalities on echo.   Severe pulm HTN- PAP 63 Plan: Cardiology following Scheduled for RHC/LHC today Heparin infusion per Cards, likely to d/c after cath  Hx HTN, HLD. Plan: Continue bisoprolol -cardioselective continue pravastatin.  Anxiety. Plan: Continue bupropion, klonopin,  paxil  Hyperglycemia - likely steroid induced Plan: add SSI if greater than 180.   CCT 30 mins  Will keep in ICU today for monitoring after heart cath, then likely to move out if still doing well from a respiratory standpoint.   Kennieth Rad, AGACNP-BC [facility] Pgr: [phone] or if no answer [phone] 05/09/2017, 10:00 AM

## 2017-05-09 NOTE — Progress Notes (Signed)
Site area: rt groin fa and fv sheath Site Prior to Removal:  Level 0 Pressure Applied For: 20 minutes Manual:   yes Patient Status During Pull:  stable Post Pull Site:  Level 0 Post Pull Instructions Given:  yes Post Pull Pulses Present: palpable  Dressing Applied:  Gauze and tegaderm Bedrest begins @ 1500 Comments:

## 2017-05-09 NOTE — Interval H&P Note (Signed)
History and Physical Interval Note:  05/09/2017 1:03 PM  Kayla Mack  has presented today for surgery, with the diagnosis of cp  The various methods of treatment have been discussed with the patient and family. After consideration of risks, benefits and other options for treatment, the patient has consented to  Procedure(s): Right/Left Heart Cath and Coronary Angiography (N/A) as a surgical intervention .  The patient's history has been reviewed, patient examined, no change in status, stable for surgery.  I have reviewed the patient's chart and labs.  Questions were answered to the patient's satisfaction.     [name]

## 2017-05-10 ENCOUNTER — Encounter (HOSPITAL_COMMUNITY): Payer: Self-pay | Admitting: Cardiovascular Disease

## 2017-05-10 LAB — BASIC METABOLIC PANEL
Anion gap: 7 (ref 5–15)
BUN: 15 mg/dL (ref 6–20)
CO2: 34 mmol/L — ABNORMAL HIGH (ref 22–32)
Calcium: 8.1 mg/dL — ABNORMAL LOW (ref 8.9–10.3)
Chloride: 97 mmol/L — ABNORMAL LOW (ref 101–111)
Creatinine, Ser: 0.59 mg/dL (ref 0.44–1.00)
GFR calc Af Amer: >60 mL/min (ref >60)
GFR calc non Af Amer: >60 mL/min (ref >60)
Glucose, Bld: 107 mg/dL — ABNORMAL HIGH (ref 65–99)
Potassium: 3.8 mmol/L (ref 3.5–5.1)
Sodium: 138 mmol/L (ref 135–145)

## 2017-05-10 LAB — CBC
HCT: 39.9 % (ref 36.0–46.0)
Hemoglobin: 12.6 g/dL (ref 12.0–15.0)
MCH: 30.4 pg (ref 26.0–34.0)
MCHC: 31.6 g/dL (ref 30.0–36.0)
MCV: 96.1 fL (ref 78.0–100.0)
Platelets: 218 10*3/uL (ref 150–400)
RBC: 4.15 MIL/uL (ref 3.87–5.11)
RDW: 13.7 % (ref 11.5–15.5)
WBC: 9.3 10*3/uL (ref 4.0–10.5)

## 2017-05-10 MED ORDER — METHYLPREDNISOLONE SODIUM SUCC 40 MG IJ SOLR: 40.0000 mg | Freq: Every day | INTRAMUSCULAR | Status: DC

## 2017-05-10 NOTE — Progress Notes (Signed)
Name: Kayla Mack MRN: [id number] DOB: 1946-02-26    ADMISSION DATE:  05/04/2017 CONSULTATION DATE:  05/04/17  REFERRING MD :  Gwendlyn Deutscher  CHIEF COMPLAINT:  SOB   HISTORY OF PRESENT ILLNESS:  Heavy ex-smoker, quit 1 month ago, prior CT chest shows severe bullous emphysema Her mother is sick and admitted to [address] and she is the primary caregiver. Admitted with shortness of breath and cough productive green sputum for 5 days, being treated as COPD exacerbation, adm chest x-ray  right lower lobe infiltrate -there was acute respiratory failure with hypercarbia requiring continuous BiPAP and demand ischemia  SUBJECTIVE:  No complaints, breathing much better.    VITAL SIGNS: Temp:  [97.8 F (36.6 C)-98.6 F (37 C)] 98.6 F (37 C) (07/24 0803) Pulse Rate:  [0-96] 69 (07/24 0630) Resp:  [0-29] 19 (07/24 0836) BP: (99-151)/(63-91) 138/72 (07/24 0800) SpO2:  [0 %-97 %] 96 % (07/24 0800) Weight:  [111 lb 8.8 oz (50.6 kg)] 111 lb 8.8 oz (50.6 kg) (07/24 0447)  PHYSICAL EXAMINATION: General:  Thin female lying in bed NAD HEENT: [place]/AT, MM pink/moist Neuro: AOx3, non-focal CV: RRR, no murmur PULM: even/normal WOB, speaking full sentences, clear, diminished throughout, no wheeze GI: soft, non-tender, bs active  Extremities: warm/dry, no edema  Skin: no rashes or lesions   Recent Labs Lab 05/08/17 0212 05/09/17 0144 05/10/17 0630  NA 137 137 138  K 4.2 4.0 3.8  CL 100* 95* 97*  CO2 32 34* 34*  BUN _0 CREATININE 0.56 0.60 0.59  GLUCOSE 154* 154* 107*    Recent Labs Lab 05/08/17 0212 05/09/17 0144 05/10/17 0630  HGB 12.1 12.6 12.6  HCT 37.8 40.7 39.9  WBC 10.7* 9.2 9.3  PLT 217 221 218   Dg Chest Port 1 View  Result Date: 05/09/2017 CLINICAL DATA:  Acute respiratory failure. EXAM: PORTABLE CHEST 1 VIEW COMPARISON:  05/07/2017. FINDINGS: Mediastinum and hilar structures are stable. Heart size stable. Stable mild bilateral interstitial prominence, most likely  chronic. Very mild active process including pneumonitis or interstitial edema cannot be excluded. COPD. No pleural effusion identified. Left costophrenic angle not imaged. No pneumothorax. Surgical screw right shoulder . IMPRESSION: 1. Stable mild bilateral interstitial prominence, most likely chronic. Very mild active process including pneumonitis or interstitial edema cannot be excluded. 2. COPD . Electronically Signed   By: Marcello Moores  Register   On: 05/09/2017 06:52    STUDIES:  Echo 7/19 > LVEF 60-65%, no wall motion abnormalities.  LHC/RHC 7/23 >> patent coronary arteries w/mild nonobstructive CAD; mod pulmonary HTN with preserved CO and normal RA pressure; normal LVEDP  SIGNIFICANT EVENTS  7/18 > admit. 7/19 > PCCM consult.  ABX:  7/18 Levaquin > 7/24  ASSESSMENT / PLAN:  Acute hypoxic respiratory failure - due to AECOPD. PNA RLL, resolving AECOPD - apparently told she has COPD just 2 months ago; however, no PFT's available in system. Tobacco dependence - sputum cx neg 7/18 Plan: Continue [place] O2 for goal sats 88-92% Continue bronchodialtors (Budesonide, Brovana, scheduled atrovent/xopenex) while inpatient Solumedrol decrease to 40 mg daily then taper Levaquin, stop date 7/24 after 5 days Pulmonary hygiene with IS/ flutter PT/OT treating -> recommend SNF placement Social work consulted for SNF placement On d/c recommend Trelegy Ellipta and albuterol HFA prn  Follow-up in pulmonary office on August 20 at 3pm with Rexene Edison, NP  Elevated troponin - suspect demand ischemia.  No wall motion abnormalities on echo.  LHC/RHC-> patent coronaries, moderate pulmonary HTN, normal  LVEDP Moderate pulm HTN- likely to pulm disease Plan: Cardiology following Tele monitoring  Hx HTN, HLD. Plan: Continue bisoprolol -cardioselective continue pravastatin.  Anxiety. Plan: Continue bupropion, klonopin, paxil  Hyperglycemia - likely steroid induced, resolved Plan: add SSI if greater  than 180.   Global: Patient awaiting transfer to Tele.  PCCM will sign off and be available as needed.  Family Practice to resume care while awaiting SNF placement.  Kennieth Rad, AGACNP-BC [facility] Pgr: [phone] or if no answer [phone] 05/10/2017, 8:51 AM

## 2017-05-10 NOTE — Progress Notes (Signed)
Physical Therapy Treatment Patient Details Name: Kayla Mack MRN: [id number] DOB: 12-11-1945 Today's Date: 05/10/2017    History of Present Illness 71 y.o. female with a hx of HTN, HLD, COPD, + tobacco use and anxiety who is being seen for the evaluation of abnormal EKG and abnormal cardiac enzymes in the face of acute exacerbation of chronic lung disease. s/p cardiac cath.    PT Comments    Pt performed well during tx today and continues to progress toward goals. Pt has increased ambulation distance but also increased rest breaks due to fatigue. Pt requires min guard during gait due to pt drifting right with slight instability. Pt continues to show a significant drop in SP02 between 85-88% with minimal exertion and requires mod cues for pursed lip breathing to assist in increasing 02 stat and decrease fatigue symptoms. SNF remains appropriate due to pt remaining a fall risk. Plan for next tx to focus on increasing ambulation distance and decrease amount of rest breaks while maintaining Sp02 above 90%..   Follow Up Recommendations  SNF;Supervision/Assistance - 24 hour     Equipment Recommendations   (Defer to next venue. )    Recommendations for Other Services       Precautions / Restrictions Precautions Precautions: Fall Precaution Comments: watch 02 sats Restrictions Weight Bearing Restrictions: No    Mobility  Bed Mobility Overal bed mobility: Independent;Needs Assistance Bed Mobility: Sit to Supine       Sit to supine: Independent   General bed mobility comments: Pt up in chair upon PT arrival. Pt able to achieve sit to supine independently no rails needed.   Transfers Overall transfer level: Modified independent Equipment used: 1 person hand held assist Transfers: Sit to/from Stand Sit to Stand: Supervision         General transfer comment: Supervision for safety.  Ambulation/Gait Ambulation/Gait assistance: Min guard Ambulation Distance (Feet): 175  Feet (x100 first trial, x75 second trial.) Assistive device: None Gait Pattern/deviations: Step-through pattern;Decreased stride length;Drifts right/left;Trunk flexed;Staggering right Gait velocity: decreased   General Gait Details: Pt required x1 forced seated rest break and x2 standing rest break secondary to drop in SP02 to 84-88%, with mod vc's for pursed lip breathing. Pt tends to drift/stagger to the right during gait, pt able to correct once addressed by SPTA.   Stairs            Wheelchair Mobility    Modified Rankin (Stroke Patients Only)       Balance Overall balance assessment: Needs assistance Sitting-balance support: Bilateral upper extremity supported;Feet supported Sitting balance-Leahy Scale: Good     Standing balance support: During functional activity Standing balance-Leahy Scale: Fair Standing balance comment: Able to stand without UE support, mild instability noted with dynamic standing.                            Cognition Arousal/Alertness: Awake/alert Behavior During Therapy: WFL for tasks assessed/performed Overall Cognitive Status: Within Functional Limits for tasks assessed                                 General Comments: AxOx3      Exercises General Exercises - Lower Extremity Long Arc Quad: AROM;Both;10 reps;Seated Hip Flexion/Marching: AROM;Both;10 reps;Seated Other Exercises Other Exercises: Pt performed x3 inspirometer, achieved 1200, with short duration to maintain ball between parameters.  Other Exercises: Pt performed x5 flutter valve with  min cues to take larger inhale and slow exhale for proper use.     General Comments General comments (skin integrity, edema, etc.): Sp02 drops with talking to high 80's.      Pertinent Vitals/Pain Pain Assessment: No/denies pain    Home Living                      Prior Function            PT Goals (current goals can now be found in the care plan  section) Acute Rehab PT Goals Patient Stated Goal: to go home and care for her mother PT Goal Formulation: With patient Potential to Achieve Goals: Good Progress towards PT goals: Progressing toward goals    Frequency    Min 3X/week      PT Plan Current plan remains appropriate    Co-evaluation              AM-PAC PT "6 Clicks" Daily Activity  Outcome Measure  Difficulty turning over in bed (including adjusting bedclothes, sheets and blankets)?: None Difficulty moving from lying on back to sitting on the side of the bed? : None Difficulty sitting down on and standing up from a chair with arms (e.g., wheelchair, bedside commode, etc,.)?: None Help needed moving to and from a bed to chair (including a wheelchair)?: None Help needed walking in hospital room?: A Little Help needed climbing 3-5 steps with a railing? : A Lot 6 Click Score: 21    End of Session Equipment Utilized During Treatment: Gait belt;Oxygen Activity Tolerance: Patient tolerated treatment well Patient left: in bed;with call bell/phone within reach;with bed alarm set Nurse Communication: Mobility status PT Visit Diagnosis: Muscle weakness (generalized) (M62.81);Other (comment);Difficulty in walking, not elsewhere classified (R26.2)     Time: 0600-4599 PT Time Calculation (min) (ACUTE ONLY): 35 min  Charges:  $Gait Training: 8-22 mins $Therapeutic Exercise: 8-22 mins                    G CodesRandalyn Rhea, Carey 05/10/2017, 1:33 PM

## 2017-05-10 NOTE — Progress Notes (Signed)
Family Medicine Teaching Service Daily Progress Note Intern Pager: [phone]  Patient name: Kayla Mack Medical record number: [id number] Date of birth: [birth year] Age: 71 y.o. Gender: female  Primary Care Provider: Jani Gravel, MD Consultants: CCM, Cardiology  Code Status: Full   Pt Overview and Major Events to Date:  Kayla Mack is a 71 y.o. Female presenting with Mack. Patient was admitted to [facility] on 05/04/2017. Transferred to ICU on 05/06/2017 on Bipap, not intubated. Cath on 05/09/2017. Transferred back to [place] on 05/10/17. Levaquin (7/20 > 7/24)  Assessment and Plan: Kayla Mack. PMH is significant for anxiety, COPD, depression, hyperlipidemia, hypertension, tobacco abuse disorder.  Acute COPD exacerbation Patient has history of COPD diagnosis 2 months ago, with no PFTs available in system for confirmation. Patient with 75 pack year smoking history. Possible infiltrate of right lung base on CXR. Sputum cultures negative on 7/18. On admission, ABG showing pH 7.362m pCO2 46.0, pO2 51.0, and bicarbonate of 26.4. ABG on 7/23 showing pH 7.406, pCO2 62.1, pO2 35.0, and bicarb of 39.0. Patient currently on 3 L O2 via nasal canula, with O2 saturation of 87%.  PT/OT recommendations for SNF placement, awaiting bed placement by social work. Patient today denies Mack, states some cough and clear sputum production. Patient states incentive spirometer has been helping.  -Continue [place] O2 for goal sats 88-92% -Continue Budesonide, Brovana, scheduled atrovent/xopenex while inpatient -oral prednisone 50 mg x 2 days to have a total of 10 day steroid dose.  -discontinue Levaquin (day #5) -CCM recommendations of Trelegy in place of Anoro and albuterol prn on discharge  -Pulmonology follow up with [name] NP on August 20 at 3pm   Hyperkalemia-resolved Current K of 3.9. Patient was hyperkalemic on admission with K of 6.1. Has remained wnl since  7/20.  -continue to monitor  -appreciate cardiology recommendations   Elevated troponin Likely due to demand ischemia. Troponins trended at 0.08 >0.07 >0.05 > 0.14. Echo showing no wall motion abnormalities. EF 60-65%. Left heart cath showing mild nonobstructive disease. Cardiology has seen patient and states elevated cardiac enzymes due to demand ischemia related to acute pulmonary process. Recommended to discontinue heparin and change to bisoprolol. Patient denies chest pain.  -appreciate cardiology recommendations  -begin Plavix 75 mg due to known history of CAD, patient has ASA allergy -discontinue cardiac monitoring   Pulmonary HTN Likely 2/2 COPD. Echo showing severely increased pulmonary artery pressure. Cardiac cath showing moderate pulmonary HTN with preserved cardiac output and normal RA pressure.  -appreciate cardiology recommendations   HTN Current BP of 117/70 -continue bisoprolol   HLD Home lovastatin. Lipid panel wnl with slightly low HDL of 31. -continue pravastatin   Anxiety  -continue buproprion, klonopin, and paxil   Hyperglycemia-resolved Likely due to steroid use. Current glucose of 91 on BMP  Tobacco Abuse Disorder  75 pack year history. Patient today states she has desire to quit smoking. States she will f/u with PCP for smoking cessation recommendations. Patient has failed chantix but desires to try nicotine patches and gum.  - start patch  - tobacco cessation teaching   FEN/GI: heart healthy diet  PPx: SCDs, lovenox   Disposition: improving   Subjective:  Patient today states she feels "pretty good". Patient denies Mack, but states has slight cough and clear sputum production. Patient states incentive spirometer is helping her breathing. Patient was on 3L O2 and satting at 90% when I was in room. Patient is very interested in smoking cessation.  States she desires to follow up with PCP for cessation techniques. Patient has tried chantix but did not  tolerate well. Patient desires to try nicotine patches and gum. States she has increased gas pain today. Patient desires SNF placement.   Objective: Temp:  [97.9 F (36.6 C)-98.4 F (36.9 C)] 98.4 F (36.9 C) (07/25 0815) Pulse Rate:  [68-90] 73 (07/25 0815) Resp:  [10-23] 19 (07/25 0815) BP: (105-149)/(62-95) 105/62 (07/25 0815) SpO2:  [88 %-97 %] 88 % (07/25 0815) Weight:  [112 lb 9.6 oz (51.1 kg)] 112 lb 9.6 oz (51.1 kg) (07/25 1610) Physical Exam: General: awake and alert, NAD Cardiovascular: RRR, no MRG Respiratory: CTAB, no wheezes, rales, or rhonchi  Abdomen: soft, non tender, non distended. Bowel sounds x 4 quadrants  Extremities: no edema, non tender   Laboratory:  Recent Labs Lab 05/09/17 0144 05/10/17 0630 05/11/17 0411  WBC 9.2 9.3 9.8  HGB 12.6 12.6 13.4  HCT 40.7 39.9 41.3  PLT 221 218 224    Recent Labs Lab 05/07/17 0307  05/09/17 0144 05/10/17 0630 05/11/17 0411  NA 136  < > 137 138 138  K 4.7  < > 4.0 3.8 3.9  CL 100*  < > 95* 97* 99*  CO2 28  < > 34* 34* 33*  BUN 13  < > _0 CREATININE 0.64  < > 0.60 0.59 0.66  CALCIUM 8.6*  < > 8.6* 8.1* 8.3*  PROT 5.7*  --   --   --   --   BILITOT 0.6  --   --   --   --   ALKPHOS 62  --   --   --   --   ALT 36  --   --   --   --   AST 29  --   --   --   --   GLUCOSE 146*  < > 154* 107* 91  < > = values in this interval not displayed.   Imaging/Diagnostic Tests: Dg Chest 2 View  Result Date: 05/04/2017 CLINICAL DATA:  Cough, shortness of breath, hypertension, COPD, smoker EXAM: CHEST  2 VIEW COMPARISON:  03/21/2017 FINDINGS: Normal heart size. Prominent central pulmonary arteries and hila question pulmonary arterial hypertension. Mediastinal contours otherwise normal. Atherosclerotic calcifications aorta. Emphysematous and bronchitic changes consistent with COPD. Bullous disease RIGHT upper lobe. Increased basilar markings versus previous study particularly on RIGHT cannot exclude superimposed  infiltrate or atelectasis. Upper lungs clear. No pleural effusion or pneumothorax. Bones demineralized with chronic compression deformity of a lower thoracic vertebra unchanged. IMPRESSION: COPD changes with mild superimposed atelectasis versus infiltrate at lung bases especially on RIGHT. 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 Aortic Atherosclerosis (ICD10-I70.0) and Emphysema (ICD10-J43.9). Electronically Signed   By: Lavonia Dana M.D.   On: 05/04/2017 08:26   Dg Chest Port 1 View  Result Date: 05/09/2017 CLINICAL DATA:  Acute respiratory failure. EXAM: PORTABLE CHEST 1 VIEW COMPARISON:  05/07/2017. FINDINGS: Mediastinum and hilar structures are stable. Heart size stable. Stable mild bilateral interstitial prominence, most likely chronic. Very mild active process including pneumonitis or interstitial edema cannot be excluded. COPD. No pleural effusion identified. Left costophrenic angle not imaged. No pneumothorax. Surgical screw right shoulder . IMPRESSION: 1. Stable mild bilateral interstitial prominence, most likely chronic. Very mild active process including pneumonitis or interstitial edema cannot be excluded. 2. COPD . Electronically Signed   By: [place]   On: 05/09/2017 06:52   Dg Chest Port 1 View  Result Date:  05/07/2017 CLINICAL DATA:  COPD.  Follow-up. EXAM: PORTABLE CHEST 1 VIEW COMPARISON:  05/06/2017 FINDINGS: Lungs are hyperexpanded with increased lucency/emphysematous change over the mid to upper lungs. No focal airspace consolidation or effusion. Stable borderline cardiomegaly. Mild prominence of the central pulmonary arteries which could be due to pulmonary arterial hypertension. Calcified plaque over the thoracic aorta. Remainder of the exam is unchanged. IMPRESSION: No acute cardiopulmonary disease. Emphysema (ICD10-J43.9). Aortic Atherosclerosis (ICD10-I70.0). Electronically Signed   By: Marin Olp M.D.   On: 05/07/2017 07:33   Dg Chest Port 1 View  Result Date:  05/06/2017 CLINICAL DATA:  Chest congestion and productive cough. History of COPD. EXAM: PORTABLE CHEST 1 VIEW COMPARISON:  05/05/2017 ; 03/21/2017 ; chest CT - 06/02/2013 FINDINGS: Grossly unchanged cardiac silhouette and mediastinal contours with atherosclerotic plaque within the thoracic aorta. The lungs remain hyperexpanded with flattening of the bilateral diaphragms and thinning of the biapical pulmonary parenchyma, right greater than left. No discrete focal airspace opacities. No pleural effusion or pneumothorax. No evidence of edema. No acute osseus abnormalities. IMPRESSION: 1. Hyperexpanded lungs without acute cardiopulmonary disease. 2. Aortic Atherosclerosis (ICD10-I70.0) and Emphysema (ICD10-J43.9). Electronically Signed   By: Sandi Mariscal M.D.   On: 05/06/2017 12:13   Dg Chest Port 1 View  Result Date: 05/05/2017 CLINICAL DATA:  Shortness of breath EXAM: PORTABLE CHEST 1 VIEW COMPARISON:  Yesterday FINDINGS: Normal heart size. Stable fullness of the hila, with branching pattern suggesting pulmonary artery enlargement, stable. Main pulmonary artery shadow is also enlarged, findings of pulmonary hypertension. Advanced emphysema. Interstitial coarsening is at baseline. There is no edema, consolidation, effusion, or pneumothorax. IMPRESSION: 1. Advanced emphysema without acute superimposed finding. 2. Suspect pulmonary hypertension. Electronically Signed   By: Monte Fantasia M.D.   On: 05/05/2017 07:45     Caroline More, DO 05/11/2017, 11:40 AM PGY-1, [place] Intern pager: [phone], text pages welcome

## 2017-05-10 NOTE — Progress Notes (Signed)
CSW gave pt bed offers. Pt requested that CSW speak with pt's friend Vaughan Basta and let her know of the bed offers. CSW contacted pt's friend [phone] or [phone], and left voicemail for friend to return call. CSW will continue to follow pt needs at this time.    Virgie Dad Mccauley Diehl, MSW, [facility] Clinical Social Worker [phone]

## 2017-05-10 NOTE — Progress Notes (Signed)
Came by to see Ms. Rasool this morning. Patient appears comfortable on 4L O2 via nasal canula. Patient states she is glad to not be on bipap machine during day anymore. Patient denies difficulty breathing. Patient is tolerating diet well. CCM physician has stated that patient is significantly improved and lung sounds are improved today. CCM physician is considering possible discharge. Nurse informed FPTS that she will be transferred to our service later today. Patient is worried about mothers condition, as she is mother's primary care taker. Patient states mother is currently admitted to 5th floor of [facility]. Patient is anxious about discharge so she can take care of mother. Per chart review patient is recommended for SNF placement. We will continue to follow socially and appreciate the excellent care provided by CCM. We will be happy to resume care if she is transferred to our service from CCM.   Dalphine Handing, PGY-1 [facility]  05/10/2017 9:39 AM

## 2017-05-10 NOTE — Progress Notes (Signed)
Report called to [facility] . Patient transferred via wheelchair with O2 and tolerated transfer well

## 2017-05-10 NOTE — Progress Notes (Signed)
Progress Note  Patient Name: Kayla Mack Date of Encounter: 05/10/2017  Primary Cardiologist: Dr. Percival Spanish  Subjective   Pt states her breathing is much better today. She denies chest pain and palpitations.  Inpatient Medications    Scheduled Meds: . bisoprolol  5 mg Oral Daily  . buPROPion  150 mg Oral Daily  . feeding supplement (ENSURE ENLIVE)  237 mL Oral BID BM  . methylPREDNISolone (SOLU-MEDROL) injection  40 mg Intravenous Q12H  . PARoxetine  20 mg Oral Daily  . pravastatin  20 mg Oral q1800  . sodium chloride flush  3 mL Intravenous Q12H  . umeclidinium-vilanterol  1 puff Inhalation Daily   Continuous Infusions: . levofloxacin (LEVAQUIN) IV Stopped (05/08/17 1252)   PRN Meds: clonazePAM, ipratropium, levalbuterol, sodium chloride flush   Vital Signs    Vitals:   05/10/17 0803 05/10/17 0836 05/10/17 0900 05/10/17 1000  BP:    137/78  Pulse:    76  Resp:  19 (!) 23 (!) 25  Temp: 98.6 F (37 C)     TempSrc: Oral     SpO2:    92%  Weight:      Height:        Intake/Output Summary (Last 24 hours) at 05/10/17 1024 Last data filed at 05/10/17 1000  Gross per 24 hour  Intake          1098.39 ml  Output             1750 ml  Net          -651.61 ml   Filed Weights   05/08/17 0600 05/09/17 0500 05/10/17 0447  Weight: 109 lb 5.6 oz (49.6 kg) 110 lb 14.3 oz (50.3 kg) 111 lb 8.8 oz (50.6 kg)     Physical Exam   General: Well developed, well nourished, female appearing in no acute distress. Head: Normocephalic, atraumatic.  Neck: Supple without bruits, - JVD Lungs:  Resp regular and unlabored, CTA. Heart: RRR, S1, S2, no murmur; no rub. Abdomen: Soft, non-tender, non-distended with normoactive bowel sounds. No hepatomegaly. No rebound/guarding. No obvious abdominal masses. Extremities: No clubbing, cyanosis, no edema. Distal pedal pulses are 1+ bilaterally. Neuro: Alert and oriented X 3. Moves all extremities spontaneously. Psych: Normal  affect.  Labs    Chemistry Recent Labs Lab 05/07/17 0307 05/08/17 0212 05/09/17 0144 05/10/17 0630  NA 136 137 137 138  K 4.7 4.2 4.0 3.8  CL 100* 100* 95* 97*  CO2 28 32 34* 34*  GLUCOSE 146* 154* 154* 107*  BUN _0 CREATININE 0.64 0.56 0.60 0.59  CALCIUM 8.6* 8.4* 8.6* 8.1*  PROT 5.7*  --   --   --   ALBUMIN 3.2*  --   --   --   AST 29  --   --   --   ALT 36  --   --   --   ALKPHOS 62  --   --   --   BILITOT 0.6  --   --   --   GFRNONAA >60 >60 >60 >60  GFRAA >60 >60 >60 >60  ANIONGAP _1 Hematology Recent Labs Lab 05/08/17 0212 05/09/17 0144 05/10/17 0630  WBC 10.7* 9.2 9.3  RBC 3.88 4.19 4.15  HGB 12.1 12.6 12.6  HCT 37.8 40.7 39.9  MCV 97.4 97.1 96.1  MCH 31.2 30.1 30.4  MCHC 32.0 31.0 31.6  RDW 14.1 14.0 13.7  PLT  217 221 218    Cardiac Enzymes Recent Labs Lab 05/04/17 1050 05/04/17 1641 05/04/17 2250 05/05/17 0710  TROPONINI 0.08* 0.07* 0.05* 0.14*    Recent Labs Lab 05/04/17 0806  TROPIPOC 0.04     BNPNo results for input(s): BNP, PROBNP in the last 168 hours.   DDimer  Recent Labs Lab 05/05/17 1054  DDIMER 0.39     Radiology    Dg Chest Port 1 View  Result Date: 05/09/2017 CLINICAL DATA:  Acute respiratory failure. EXAM: PORTABLE CHEST 1 VIEW COMPARISON:  05/07/2017. FINDINGS: Mediastinum and hilar structures are stable. Heart size stable. Stable mild bilateral interstitial prominence, most likely chronic. Very mild active process including pneumonitis or interstitial edema cannot be excluded. COPD. No pleural effusion identified. Left costophrenic angle not imaged. No pneumothorax. Surgical screw right shoulder . IMPRESSION: 1. Stable mild bilateral interstitial prominence, most likely chronic. Very mild active process including pneumonitis or interstitial edema cannot be excluded. 2. COPD . Electronically Signed   By: Marcello Moores  Register   On: 05/09/2017 06:52     Telemetry    NSR with PVCs - Personally  Reviewed  ECG    No new tracings - Personally Reviewed  Cardiac Studies   Left/right heart catheterization 05/09/17: 1. Patent coronary arteries with mild nonobstructive CAD 2. Moderate pulmonary HTN with preserved cardiac output and normal RA pressure  Transpulmonic gradient 26 mmHg, PVR = 5.6 [place] Units 3. Normal LVEDP Suspect troponin elevation related to demand ischemia. Pulmonary HTN likely related to chronic lung disease.   2D echo 04/2017 Study Conclusions - Left ventricle: The cavity size was normal. There was mild concentric hypertrophy. Systolic function was normal. The estimated ejection fraction was in the range of 60% to 65%. Wall motion was normal; there were no regional wall motion abnormalities. - Aortic valve: Transvalvular velocity was within the normal range. There was no stenosis. There was no regurgitation. - Mitral valve: Transvalvular velocity was within the normal range. There was no evidence for stenosis. There was mild regurgitation. - Right ventricle: The cavity size was normal. Wall thickness was normal. Systolic function was normal. - Tricuspid valve: There was trivial regurgitation. - Pulmonary arteries: Systolic pressure was severely increased. PA peak pressure: 63 mm Hg (S). - Pericardium, extracardiac: Minimal ascites was noted.  Patient Profile     71 y.o. female with a hx of HTN, HLD, COPD, + tobacco use and anxiety who is being seen for the evaluation of abnormal EKG and abnormal cardiac enzymes in the face of acute exacerbation of chronic lung disease.   Assessment & Plan    1. Elevated troponin - left heart cath with mild nonobstructive disease - elevated troponin likely demand ischemia - IV heparin has been D/C'ed - she denies chest pain and is breathing better today   2. Moderate Pulmonary HTN - cardiac output preserved - likely secondary to COPD - she does not appear volume up on exam   3. Hyperkalemia - K  normal at 3.8   4. Acute COPD exacerbation - off BIPAP, likely transfer out of CCM to telemetry bed today - continue breathing treatments    Signed, Ledora Bottcher , PA-C 10:24 AM 05/10/2017 Pager: [phone]

## 2017-05-11 LAB — BASIC METABOLIC PANEL
Anion gap: 6 (ref 5–15)
BUN: 14 mg/dL (ref 6–20)
CO2: 33 mmol/L — ABNORMAL HIGH (ref 22–32)
Calcium: 8.3 mg/dL — ABNORMAL LOW (ref 8.9–10.3)
Chloride: 99 mmol/L — ABNORMAL LOW (ref 101–111)
Creatinine, Ser: 0.66 mg/dL (ref 0.44–1.00)
GFR calc Af Amer: >60 mL/min (ref >60)
GFR calc non Af Amer: >60 mL/min (ref >60)
Glucose, Bld: 91 mg/dL (ref 65–99)
Potassium: 3.9 mmol/L (ref 3.5–5.1)
Sodium: 138 mmol/L (ref 135–145)

## 2017-05-11 LAB — CBC
HCT: 41.3 % (ref 36.0–46.0)
Hemoglobin: 13.4 g/dL (ref 12.0–15.0)
MCH: 31.2 pg (ref 26.0–34.0)
MCHC: 32.4 g/dL (ref 30.0–36.0)
MCV: 96 fL (ref 78.0–100.0)
Platelets: 224 10*3/uL (ref 150–400)
RBC: 4.3 MIL/uL (ref 3.87–5.11)
RDW: 14 % (ref 11.5–15.5)
WBC: 9.8 10*3/uL (ref 4.0–10.5)

## 2017-05-11 MED ORDER — ENOXAPARIN SODIUM 40 MG/0.4ML ~~LOC~~ SOLN: 40.0000 mg | Freq: Every day | SUBCUTANEOUS | Status: DC

## 2017-05-11 MED ORDER — ENOXAPARIN SODIUM 40 MG/0.4ML ~~LOC~~ SOLN
40.0000 mg | Freq: Every day | SUBCUTANEOUS | Status: DC
  Administered 2017-05-11 – 2017-05-12 (×2): 40 mg via SUBCUTANEOUS
  Filled 2017-05-11 (×2): qty 0.4

## 2017-05-11 MED ORDER — CLOPIDOGREL BISULFATE 75 MG PO TABS
75.0000 mg | ORAL_TABLET | Freq: Every day | ORAL | Status: DC
  Administered 2017-05-11 – 2017-05-13 (×3): 75 mg via ORAL
  Filled 2017-05-11 (×3): qty 1

## 2017-05-11 MED ORDER — PREDNISONE 20 MG PO TABS
50.0000 mg | ORAL_TABLET | Freq: Every day | ORAL | Status: AC
  Administered 2017-05-12 – 2017-05-13 (×2): 50 mg via ORAL
  Filled 2017-05-11 (×2): qty 2

## 2017-05-11 NOTE — Progress Notes (Signed)
Occupational Therapy Treatment Patient Details Name: Kayla Mack MRN: [id number] DOB: Jul 30, 1946 Today's Date: 05/11/2017    History of present illness 71 y.o. female with a hx of HTN, HLD, COPD, + tobacco use and anxiety who is being seen for the evaluation of abnormal EKG and abnormal cardiac enzymes in the face of acute exacerbation of chronic lung disease. s/p cardiac cath.   OT comments  Pt able to perform toilet transfer and grooming task in standing with min guard assist. Pt performed functional mobility in hallway with SpO2 >83% on 3L; educated pt on standing rest break, pursed lip breathing, and not talking during mobility-SpO2 improved to low 90s. Encouraged use of IS, flutter, and expelling secretions when possible. D/c plan remains appropriate. Will continue to follow acutely.   Follow Up Recommendations  SNF    Equipment Recommendations  None recommended by OT    Recommendations for Other Services      Precautions / Restrictions Precautions Precautions: Fall Precaution Comments: watch 02 sats Restrictions Weight Bearing Restrictions: No       Mobility Bed Mobility Overal bed mobility: Modified Independent             General bed mobility comments: HOB elevated with increased time; no physical assist required  Transfers Overall transfer level: Needs assistance Equipment used: None Transfers: Sit to/from Stand Sit to Stand: Supervision         General transfer comment: for safety and lines    Balance Overall balance assessment: Needs assistance Sitting-balance support: No upper extremity supported;Feet supported Sitting balance-Leahy Scale: Good     Standing balance support: No upper extremity supported;During functional activity Standing balance-Leahy Scale: Fair                             ADL either performed or assessed with clinical judgement   ADL Overall ADL's : Needs assistance/impaired     Grooming: Min  guard;Standing;Wash/dry Teacher, music: Min guard;Ambulation;Regular Toilet   Toileting- Water quality scientist and Hygiene: Supervision/safety;Sit to/from stand       Functional mobility during ADLs: Min guard General ADL Comments: Pt able to perform functional mobility in hallway, required standing rest break x1 with SpO2 >83% throughout on 3L supplemental O2. Cues for pursed lip breathing and not talking during mobility. Encouraged use of IS and flutter; and expelling secretions if possible.      Vision       Perception     Praxis      Cognition Arousal/Alertness: Awake/alert Behavior During Therapy: WFL for tasks assessed/performed Overall Cognitive Status: Within Functional Limits for tasks assessed                                          Exercises     Shoulder Instructions       General Comments      Pertinent Vitals/ Pain       Pain Assessment: No/denies pain  Home Living                                          Prior Functioning/Environment  Frequency  Min 2X/week        Progress Toward Goals  OT Goals(current goals can now be found in the care plan section)  Progress towards OT goals: Progressing toward goals  Acute Rehab OT Goals Patient Stated Goal: to go home and care for her mother OT Goal Formulation: With patient  Plan Discharge plan remains appropriate    Co-evaluation                 AM-PAC PT "6 Clicks" Daily Activity     Outcome Measure   Help from another person eating meals?: None Help from another person taking care of personal grooming?: A Little Help from another person toileting, which includes using toliet, bedpan, or urinal?: A Little Help from another person bathing (including washing, rinsing, drying)?: A Little Help from another person to put on and taking off regular upper body clothing?: None Help from another person to put  on and taking off regular lower body clothing?: A Little 6 Click Score: 20    End of Session Equipment Utilized During Treatment: Gait belt;Oxygen  OT Visit Diagnosis: Unsteadiness on feet (R26.81);Muscle weakness (generalized) (M62.81);Other abnormalities of gait and mobility (R26.89);Other (comment)   Activity Tolerance Patient tolerated treatment well   Patient Left in chair;with call bell/phone within reach;Other (comment) (MD present)   Nurse Communication          Time: 3570-1779 OT Time Calculation (min): 19 min  Charges: OT General Charges $OT Visit: 1 Procedure OT Treatments $Self Care/Home Management : 8-22 mins  Kobi Mario A. Ulice Brilliant, M.S., OTR/L Pager: [place] 05/11/2017, 2:06 PM

## 2017-05-11 NOTE — Progress Notes (Signed)
Clinical Social Worker met patient at bedside to discuss disposition options. CSW went over both SNF and Home First placement. After carefull consideration by patient, patient stated she would prefer Home First program. Patient stated Home First would allow her to return home with maximum support and be near her mother. Kayla Mack from [facility] is following patient and has been made aware of patients decision.   Kayla Mack, MSW,  [place]

## 2017-05-11 NOTE — Care Management Important Message (Signed)
Important Message  Patient Details  Name: Kayla Mack MRN: [id number] Date of Birth: 08-Jan-1946   Medicare Important Message Given:  Yes    Karrah Mangini 05/11/2017, 12:05 PM

## 2017-05-12 LAB — BASIC METABOLIC PANEL
Anion gap: 6 (ref 5–15)
BUN: 18 mg/dL (ref 6–20)
CO2: 34 mmol/L — ABNORMAL HIGH (ref 22–32)
Calcium: 8.4 mg/dL — ABNORMAL LOW (ref 8.9–10.3)
Chloride: 101 mmol/L (ref 101–111)
Creatinine, Ser: 0.82 mg/dL (ref 0.44–1.00)
GFR calc Af Amer: >60 mL/min (ref >60)
GFR calc non Af Amer: >60 mL/min (ref >60)
Glucose, Bld: 85 mg/dL (ref 65–99)
Potassium: 5.1 mmol/L (ref 3.5–5.1)
Sodium: 141 mmol/L (ref 135–145)

## 2017-05-12 LAB — CBC
HCT: 41.6 % (ref 36.0–46.0)
Hemoglobin: 13.8 g/dL (ref 12.0–15.0)
MCH: 32 pg (ref 26.0–34.0)
MCHC: 33.2 g/dL (ref 30.0–36.0)
MCV: 96.5 fL (ref 78.0–100.0)
Platelets: 221 10*3/uL (ref 150–400)
RBC: 4.31 MIL/uL (ref 3.87–5.11)
RDW: 13.9 % (ref 11.5–15.5)
WBC: 10.4 10*3/uL (ref 4.0–10.5)

## 2017-05-12 MED ORDER — IPRATROPIUM BROMIDE 0.02 % IN SOLN: 0.5000 mg | RESPIRATORY_TRACT | Status: DC | PRN

## 2017-05-12 MED ORDER — LEVALBUTEROL HCL 0.63 MG/3ML IN NEBU: 0.6300 mg | INHALATION_SOLUTION | RESPIRATORY_TRACT | Status: DC | PRN

## 2017-05-12 NOTE — Progress Notes (Signed)
Physical Therapy Treatment Patient Details Name: Kayla Mack MRN: [id number] DOB: Aug 29, 1946 Today's Date: 05/12/2017    History of Present Illness 71 y.o. female with a hx of HTN, HLD, COPD, + tobacco use and anxiety who is being seen for the evaluation of abnormal EKG and abnormal cardiac enzymes in the face of acute exacerbation of chronic lung disease. s/p cardiac cath.    PT Comments    Pt presents with improved mobility this session with continued need for monitoring of O2 sats. Pt is able to complete 250' with 2 standing rest breaks due to desating to 85% but she quickly recovers to 91% with rest on 3L. Pt plants to return home with HHPT arranged by case manager. Pt will benefit from continued follow-up in order to maximize her function to return home.     Follow Up Recommendations  Home health PT (has already been arranged by case manager)     Equipment Recommendations  None recommended by PT (has all equipment at home )    Recommendations for Other Services       Precautions / Restrictions Precautions Precautions: Fall Precaution Comments: watch 02 sats Restrictions Weight Bearing Restrictions: No    Mobility  Bed Mobility               General bed mobility comments: pt sitting up in recliner when PT arrives  Transfers Overall transfer level: Needs assistance Equipment used: None Transfers: Sit to/from Stand Sit to Stand: Supervision         General transfer comment: supervision for safety with lines  Ambulation/Gait Ambulation/Gait assistance: Supervision Ambulation Distance (Feet): 200 Feet Assistive device: None Gait Pattern/deviations: Step-through pattern;Decreased stride length Gait velocity: approaching normal Gait velocity interpretation: Below normal speed for age/gender General Gait Details: Pt requires 2 standing rest breaks due to O2 sats dropping to 85 with 3L in place. With rest, pt quickly returns to 92%. Pt requires cues to  slow down cadence and maintain good breathing patterns to reduce risk of O2 sats dropping.    Stairs            Wheelchair Mobility    Modified Rankin (Stroke Patients Only)       Balance Overall balance assessment: Needs assistance Sitting-balance support: No upper extremity supported;Feet supported Sitting balance-Leahy Scale: Good     Standing balance support: No upper extremity supported;During functional activity Standing balance-Leahy Scale: Fair Standing balance comment: Able to stand without UE support, mild instability noted with dynamic standing.                            Cognition Arousal/Alertness: Awake/alert Behavior During Therapy: WFL for tasks assessed/performed Overall Cognitive Status: Within Functional Limits for tasks assessed                                        Exercises      General Comments        Pertinent Vitals/Pain Pain Assessment: No/denies pain    Home Living                      Prior Function            PT Goals (current goals can now be found in the care plan section) Acute Rehab PT Goals Patient Stated Goal: to go home and care for  her mother PT Goal Formulation: With patient Time For Goal Achievement: 05/20/17 Potential to Achieve Goals: Good Progress towards PT goals: Progressing toward goals    Frequency    Min 3X/week      PT Plan Discharge plan needs to be updated    Co-evaluation              AM-PAC PT "6 Clicks" Daily Activity  Outcome Measure  Difficulty turning over in bed (including adjusting bedclothes, sheets and blankets)?: None Difficulty moving from lying on back to sitting on the side of the bed? : None Difficulty sitting down on and standing up from a chair with arms (e.g., wheelchair, bedside commode, etc,.)?: A Little Help needed moving to and from a bed to chair (including a wheelchair)?: A Little Help needed walking in hospital room?: A  Little Help needed climbing 3-5 steps with a railing? : A Little 6 Click Score: 20    End of Session Equipment Utilized During Treatment: Gait belt;Oxygen Activity Tolerance: Patient tolerated treatment well Patient left: in chair;with call bell/phone within reach Nurse Communication: Mobility status PT Visit Diagnosis: Muscle weakness (generalized) (M62.81);Other (comment);Difficulty in walking, not elsewhere classified (R26.2)     Time: 6294-7654 PT Time Calculation (min) (ACUTE ONLY): 23 min  Charges:  $Gait Training: 23-37 mins                    G Codes:       Scheryl Marten PT, DPT  [phone]    Jacqulyn Liner Sloan Leiter 05/12/2017, 4:22 PM

## 2017-05-12 NOTE — Progress Notes (Signed)
Family Medicine Teaching Service Daily Progress Note Intern Pager: [phone]  Patient name: CECILEE ROSNER Medical record number: [id number] Date of birth: Apr 14, 1946 Age: 71 y.o. Gender: female  Primary Care Provider: Jani Gravel, MD Consultants: CCM, Cardiology  Code Status: Full   Pt Overview and Major Events to Date:  Hanne Kegg is a 71 y.o. Female presenting with SOB. Patient was admitted to [facility] on 05/04/2017. Transferred to ICU on 05/06/2017 on Bipap, not intubated. Cath on 05/09/2017. Transferred back to [place] on 05/10/17. Levaquin (7/20 > 7/24)  Assessment and Plan: Klynn Linnemann Blumenheinis a 71 y.o.femalepresenting with SOB. PMH is significant for anxiety, COPD, depression, hyperlipidemia, hypertension, tobacco abuse disorder.  Acute COPD exacerbation Patient has history of COPD diagnosis 2 months ago, with no PFTs available in system for confirmation. Patient with 75 pack year smoking history. Possibleinfiltrate of right lung base on CXR. Sputum cultures negative on 7/18. On admission, ABG showing pH 7.39m pCO2 46.0, pO2 51.0, and bicarbonate of 26.4. ABG on 7/23 showing pH 7.406, pCO2 62.1, pO2 35.0, and bicarb of 39.0. Patient currently on 3L O2 via nasal canula, with O2 saturation of 92%.  PT/OT recommendations for SNF placement. Patient today states she is still deciding between SNF vs. Home first care. Levaquin discontinued after total of 5 days. Patient notes no SOB, but has some cough with thin clear sputum. Patient was able to ambulate with PT but states she feels weak since being hospitalized.  -will transfer patient patient to floor from step-down -Continue NCO2 for goal sats88-92%, wean O2 as tolerated -ContinueBudesonide, Brovana, scheduled atrovent/xopenex while inpatient -oral prednisone 50 mg x 2 days to have a total of 10 day steroid dose.  -CCM recommendations of Trelegy in place of Anoro and albuterol prn on discharge  -Pulmonology follow up with [name] NP on August 20 at 3pm   Hyperkalemia-resolved Current K of 5.1. Patient was hyperkalemic on admission with K of 6.1. Has remained wnl since 7/20.  -continue to monitor  -appreciate cardiology recommendations   Elevated troponin Likely due to demand ischemia. Troponins trended at 0.08 >0.07 >0.05 >0.14. Echo showing no wall motion abnormalities. EF 60-65%. Left heart cath showing mild nonobstructive disease. Cardiology has seen patient and states elevated cardiac enzymes due to demand ischemia related to acute pulmonary process. Recommended to discontinue heparin, continue bisoprolol, and continue lovastatin. Cardiology has signed off. Patient denies chest pain.  -appreciate cardiology recommendations  -begin Plavix 75 mg due to known history of CAD, patient has ASA allergy  Pulmonary HTN Likely 2/2 COPD. Echo showing severely increased pulmonary artery pressure. Cardiac cath showing moderate pulmonary HTN with preserved cardiac output and normal RA pressure.  -appreciate cardiology recommendations   HTN Current BP of 123/73 -continue bisoprolol   HLD Home lovastatin. Lipid panel wnl with slightly low HDL of 31. -continue pravastatin   Anxiety  -continue buproprion, klonopin, and paxil   Hyperglycemia-resolved Likely due to steroid use. Current glucose of 85 on BMP  Tobacco Abuse Disorder  75 pack year history. Patient stated she has desire to quit smoking. States she will f/u with PCP for smoking cessation recommendations. Patient has failed chantix but desires to try nicotine patches and gum.  - start patch  - tobacco cessation teaching   FEN/GI: heart healthy diet  PPx: SCDs, lovenox   Disposition: home health vs. SNF   Subjective:  Patient today states she feels "great". States no SOB, but attests to cough with thin clear sputum production. Patient states  she was able to ambulate yesterday with PT and states she is slightly weak from being  hospitalized. Patient today is still unsure of SNF vs. Home health.   Objective: Temp:  [97.8 F (36.6 C)-98.8 F (37.1 C)] 98.5 F (36.9 C) (07/26 0800) Pulse Rate:  [70-77] 74 (07/26 0800) Resp:  [15-19] 18 (07/26 0800) BP: (108-140)/(58-104) 108/58 (07/26 0800) SpO2:  [91 %-94 %] 92 % (07/26 0819) Physical Exam: General: awake and alert, sitting up in bed reading newspaper, nasal canula in place Cardiovascular: RRR, no MRG Respiratory: CTAB, no wheezes, rales, or rhonchi Abdomen: soft, non tender, non distended, bowel sounds x 4 quadrants  Extremities: no edema, non tender   Laboratory:  Recent Labs Lab 05/10/17 0630 05/11/17 0411 05/12/17 0335  WBC 9.3 9.8 10.4  HGB 12.6 13.4 13.8  HCT 39.9 41.3 41.6  PLT 218 224 221    Recent Labs Lab 05/07/17 0307  05/10/17 0630 05/11/17 0411 05/12/17 0335  NA 136  < > 138 138 141  K 4.7  < > 3.8 3.9 5.1  CL 100*  < > 97* 99* 101  CO2 28  < > 34* 33* 34*  BUN 13  < > _0 CREATININE 0.64  < > 0.59 0.66 0.82  CALCIUM 8.6*  < > 8.1* 8.3* 8.4*  PROT 5.7*  --   --   --   --   BILITOT 0.6  --   --   --   --   ALKPHOS 62  --   --   --   --   ALT 36  --   --   --   --   AST 29  --   --   --   --   GLUCOSE 146*  < > 107* 91 85  < > = values in this interval not displayed.  ABG    Component Value Date/Time   PHART 7.423 05/09/2017 1411   PCO2ART 56.6 (H) 05/09/2017 1411   PO2ART 59.0 (L) 05/09/2017 1411   HCO3 36.9 (H) 05/09/2017 1411   HCO3 39.0 (H) 05/09/2017 1411   TCO2 39 05/09/2017 1411   TCO2 41 05/09/2017 1411   ACIDBASEDEF 3.4 (H) 05/05/2017 0650   O2SAT 90.0 05/09/2017 1411   O2SAT 65.0 05/09/2017 1411     Imaging/Diagnostic Tests: Dg Chest 2 View  Result Date: 05/04/2017 CLINICAL DATA:  Cough, shortness of breath, hypertension, COPD, smoker EXAM: CHEST  2 VIEW COMPARISON:  03/21/2017 FINDINGS: Normal heart size. Prominent central pulmonary arteries and hila question pulmonary arterial hypertension.  Mediastinal contours otherwise normal. Atherosclerotic calcifications aorta. Emphysematous and bronchitic changes consistent with COPD. Bullous disease RIGHT upper lobe. Increased basilar markings versus previous study particularly on RIGHT cannot exclude superimposed infiltrate or atelectasis. Upper lungs clear. No pleural effusion or pneumothorax. Bones demineralized with chronic compression deformity of a lower thoracic vertebra unchanged. IMPRESSION: COPD changes with mild superimposed atelectasis versus infiltrate at lung bases especially on RIGHT. 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 Aortic Atherosclerosis (ICD10-I70.0) and Emphysema (ICD10-J43.9). Electronically Signed   By: Lavonia Dana M.D.   On: 05/04/2017 08:26   Dg Chest Port 1 View  Result Date: 05/09/2017 CLINICAL DATA:  Acute respiratory failure. EXAM: PORTABLE CHEST 1 VIEW COMPARISON:  05/07/2017. FINDINGS: Mediastinum and hilar structures are stable. Heart size stable. Stable mild bilateral interstitial prominence, most likely chronic. Very mild active process including pneumonitis or interstitial edema cannot be excluded. COPD. No pleural effusion identified. Left costophrenic angle not imaged.  No pneumothorax. Surgical screw right shoulder . IMPRESSION: 1. Stable mild bilateral interstitial prominence, most likely chronic. Very mild active process including pneumonitis or interstitial edema cannot be excluded. 2. COPD . Electronically Signed   By: Marcello Moores  Register   On: 05/09/2017 06:52   Dg Chest Port 1 View  Result Date: 05/07/2017 CLINICAL DATA:  COPD.  Follow-up. EXAM: PORTABLE CHEST 1 VIEW COMPARISON:  05/06/2017 FINDINGS: Lungs are hyperexpanded with increased lucency/emphysematous change over the mid to upper lungs. No focal airspace consolidation or effusion. Stable borderline cardiomegaly. Mild prominence of the central pulmonary arteries which could be due to pulmonary arterial hypertension. Calcified plaque over the thoracic  aorta. Remainder of the exam is unchanged. IMPRESSION: No acute cardiopulmonary disease. Emphysema (ICD10-J43.9). Aortic Atherosclerosis (ICD10-I70.0). Electronically Signed   By: Marin Olp M.D.   On: 05/07/2017 07:33   Dg Chest Port 1 View  Result Date: 05/06/2017 CLINICAL DATA:  Chest congestion and productive cough. History of COPD. EXAM: PORTABLE CHEST 1 VIEW COMPARISON:  05/05/2017 ; 03/21/2017 ; chest CT - 06/02/2013 FINDINGS: Grossly unchanged cardiac silhouette and mediastinal contours with atherosclerotic plaque within the thoracic aorta. The lungs remain hyperexpanded with flattening of the bilateral diaphragms and thinning of the biapical pulmonary parenchyma, right greater than left. No discrete focal airspace opacities. No pleural effusion or pneumothorax. No evidence of edema. No acute osseus abnormalities. IMPRESSION: 1. Hyperexpanded lungs without acute cardiopulmonary disease. 2. Aortic Atherosclerosis (ICD10-I70.0) and Emphysema (ICD10-J43.9). Electronically Signed   By: Sandi Mariscal M.D.   On: 05/06/2017 12:13   Dg Chest Port 1 View  Result Date: 05/05/2017 CLINICAL DATA:  Shortness of breath EXAM: PORTABLE CHEST 1 VIEW COMPARISON:  Yesterday FINDINGS: Normal heart size. Stable fullness of the hila, with branching pattern suggesting pulmonary artery enlargement, stable. Main pulmonary artery shadow is also enlarged, findings of pulmonary hypertension. Advanced emphysema. Interstitial coarsening is at baseline. There is no edema, consolidation, effusion, or pneumothorax. IMPRESSION: 1. Advanced emphysema without acute superimposed finding. 2. Suspect pulmonary hypertension. Electronically Signed   By: Monte Fantasia M.D.   On: 05/05/2017 07:45     Caroline More, DO 05/12/2017, 11:05 AM PGY-1, [place] Intern pager: [phone], text pages welcome

## 2017-05-12 NOTE — Consult Note (Signed)
[facility] CM Primary Care Navigator  05/12/2017  Kayla Mack 1946/07/16 [id number]   Went to see patient at the bedside to identify possible discharge needs.  Patient stateshaving difficulty breathing, weakness and fatiguethat had led to this admission. Patient endorses Dr[facility] Jani Gravel with [facility] as theprimary care provider.   [facility]. She plans to use [facility] pharmacy when she is discharged which will prevent her from getting exhausted when picking up medications as stated.  Patient states managing her ownmedications at home straight out of the containers but plans to use "pill box" system after discharge.   Patient states that she drivesprior to admission and hopes to do the same when discharged. She mentioned that her friend Vaughan Basta) may be able to help with transportation to her doctors' appointments. [facility] list of transportation resources providedto use as back-up in case needed.  Patient states she lives alone and is usually independent with self care. She hopes to do the same after discharge. She takes care of her pets and looks after her mother who lives a mile from her, as stated.  Anticipateddischarge plan ishome when respiratory status improves per patient. Patient mentioned considering "Home First" program at discharge which would allow her to return home with maximum support and be near her mother.  Patient expressedunderstanding to call hisprimary care provider's officewhen she gets home,for a post discharge follow-upwithin a week or sooner if needed.Patient letter (with PCP's contact number) was provided as a reminder.  Discussed withpatient regarding[facility]N CM services available for further health management. She reports that she had quit smoking recently. Patient verbally agreedand optedEMMI COPDcalls instead  of visits to help her manage at home and follow-up as she recovers.   Referral was made for [facility] COPDcalls after discharge.   For questions, please contact:  Dannielle Huh, BSN, RN- [facility] Primary Care Navigator  Telephone: [phone] [place]

## 2017-05-12 NOTE — Progress Notes (Signed)
SATURATION QUALIFICATIONS: (This note is used to comply with regulatory documentation for home oxygen)  Patient Saturations on Room Air at Rest = 88%  Patient Saturations on Room Air while Ambulating  Patient Saturations on 4 Liters of oxygen while Ambulating = 94%  Please briefly explain why patient needs home oxygen: patient needs  Home oxygen to maintain adequate perfusion.

## 2017-05-12 NOTE — Care Management Note (Signed)
Case Management Note Marvetta Gibbons RN, BSN Unit 4E-Case Manager [phone]  Patient Details  Name: Kayla Mack MRN: [id number] Date of Birth: [birth year]  Subjective/Objective:  Pt admitted with COPD, not on home 02                  Action/Plan: PTA pt lived at home- recommendations for SNF per PT/OT- pt is also followed by [facility]. - Pt would qualify for new program Home First- with [facility] and [facility]- have spoken with [place] with [facility]- who has spoken with the pt and plan is for home with the Home First program - pt will need [place] orders with [facility] for discharge- RN/PT/OT/aide-  Pt will also need home 02 and will need DME order for home 02- DME will be arranged with [facility] once orders received. CM will continue to follow  Expected Discharge Date:                  Expected Discharge Plan:  [place]  In-House Referral:  Clinical Social Work  Discharge planning Services  CM Consult  Post Acute Care Choice:  Home Health Choice offered to:  Patient  DME Arranged:    DME Agency:     HH Arranged:    [place]:  [place]  Status of Service:  In process, will continue to follow  If discussed at Long Length of Stay Meetings, dates discussed:    Discharge Disposition:   Additional Comments:  Dawayne Patricia, RN 05/12/2017, 4:03 PM

## 2017-05-12 NOTE — Progress Notes (Signed)
Nutrition Follow-up  DOCUMENTATION CODES:   Non-severe (moderate) malnutrition in context of chronic illness  INTERVENTION:    Continue to offer Ensure Enlive po BID, each supplement provides 350 kcal and 20 grams of protein  NUTRITION DIAGNOSIS:   Malnutrition (moderate) related to chronic illness (COPD) as evidenced by mild depletion of body fat, moderate depletion of body fat, mild depletion of muscle mass, moderate depletions of muscle mass, severe depletion of muscle mass.  Ongoing  GOAL:   Patient will meet greater than or equal to 90% of their needs  Met with intake of diet and Ensure supplements  MONITOR:   PO intake, Supplement acceptance  REASON FOR ASSESSMENT:   Malnutrition Screening Tool    ASSESSMENT:   71 yo female with PMH of COPD, HTN, depression, HLD, anxiety who was admitted on 7/18 with worsening SOB related to acute COPD exacerbation.  Patient with improved appetite. She is consuming 75-100% of meals; also receiving Ensure Enlive once or twice daily. Intake of meals and supplements is adequate to meet nutrition needs. Labs and medications reviewed. Plans for d/c home with home health or SNF soon.  Diet Order:  Diet Heart Room service appropriate? Yes; Fluid consistency: Thin  Skin:  Reviewed, no issues  Last BM:  7/24  Height:   Ht Readings from Last 1 Encounters:  05/06/17 5' 2" (1.575 m)    Weight:   Wt Readings from Last 1 Encounters:  05/11/17 112 lb 9.6 oz (51.1 kg)    Ideal Body Weight:  50 kg  BMI:  Body mass index is 20.59 kg/m.  Estimated Nutritional Needs:   Kcal:  1400-1600  Protein:  70-80 gm  Fluid:  1.5 L  EDUCATION NEEDS:   No education needs identified at this time  Molli Barrows, [place], [place], [place] Pager [phone] After Hours Pager [phone]

## 2017-05-13 DIAGNOSIS — I2729 Other secondary pulmonary hypertension: Secondary | ICD-10-CM | POA: Diagnosis not present

## 2017-05-13 DIAGNOSIS — J439 Emphysema, unspecified: Secondary | ICD-10-CM | POA: Diagnosis not present

## 2017-05-13 LAB — BASIC METABOLIC PANEL
Anion gap: 5 (ref 5–15)
BUN: 16 mg/dL (ref 6–20)
CO2: 32 mmol/L (ref 22–32)
Calcium: 8 mg/dL — ABNORMAL LOW (ref 8.9–10.3)
Chloride: 100 mmol/L — ABNORMAL LOW (ref 101–111)
Creatinine, Ser: 0.75 mg/dL (ref 0.44–1.00)
GFR calc Af Amer: >60 mL/min (ref >60)
GFR calc non Af Amer: >60 mL/min (ref >60)
Glucose, Bld: 106 mg/dL — ABNORMAL HIGH (ref 65–99)
Potassium: 3.7 mmol/L (ref 3.5–5.1)
Sodium: 137 mmol/L (ref 135–145)

## 2017-05-13 LAB — CBC
HCT: 38.2 % (ref 36.0–46.0)
Hemoglobin: 12.8 g/dL (ref 12.0–15.0)
MCH: 31.4 pg (ref 26.0–34.0)
MCHC: 33.5 g/dL (ref 30.0–36.0)
MCV: 93.6 fL (ref 78.0–100.0)
Platelets: 222 10*3/uL (ref 150–400)
RBC: 4.08 MIL/uL (ref 3.87–5.11)
RDW: 13.2 % (ref 11.5–15.5)
WBC: 11.4 10*3/uL — ABNORMAL HIGH (ref 4.0–10.5)

## 2017-05-13 MED ORDER — FLUTICASONE-UMECLIDIN-VILANT 100-62.5-25 MCG/INH IN AEPB
1.0000 | INHALATION_SPRAY | Freq: Every day | RESPIRATORY_TRACT | 0 refills | Status: DC
Start: 2017-05-13 — End: 2017-06-06

## 2017-05-13 MED ORDER — BISOPROLOL FUMARATE 5 MG PO TABS: 5.0000 mg | ORAL_TABLET | Freq: Every day | ORAL | 0 refills | Status: DC

## 2017-05-13 MED ORDER — FLUTICASONE PROPIONATE HFA 110 MCG/ACT IN AERO: 2.0000 | INHALATION_SPRAY | Freq: Two times a day (BID) | RESPIRATORY_TRACT | 0 refills | Status: DC

## 2017-05-13 MED ORDER — CLOPIDOGREL BISULFATE 75 MG PO TABS: 75.0000 mg | ORAL_TABLET | Freq: Every day | ORAL | 0 refills | Status: AC

## 2017-05-13 MED ORDER — ENSURE ENLIVE PO LIQD: 237.0000 mL | Freq: Two times a day (BID) | ORAL | 12 refills | Status: DC

## 2017-05-13 NOTE — Care Management Note (Addendum)
Case Management Note Marvetta Gibbons RN, BSN Unit 4E-Case Manager [phone]  Patient Details  Name: Kayla Mack MRN: [id number] Date of Birth: October 09, 1946  Subjective/Objective:  Pt admitted with COPD, not on home 02                  Action/Plan: PTA pt lived at home- recommendations for SNF per PT/OT- pt is also followed by [facility]. - Pt would qualify for new program [facility]- with [facility] and [facility]- have spoken with [place] with [facility]- who has spoken with the pt and plan is for home with the Home First program - pt will need [place] orders with [facility] for discharge- RN/PT/OT/aide-  Pt will also need home 02 and will need DME order for home 02- DME will be arranged with [facility] once orders received. CM will continue to follow  Expected Discharge Date:  05/13/17               Expected Discharge Plan:  [place]  In-House Referral:  Clinical Social Work, [facility]  Discharge planning Services  CM Consult  Post Acute Care Choice:  Home Health, Durable Medical Equipment Choice offered to:  Patient  DME Arranged:  Oxygen DME Agency:  [place] Arranged:  RN, PT, OT, Nurse's Aide [place] Agency:  [place]  Status of Service:  Completed, signed off  If discussed at [place] of Stay Meetings, dates discussed:    Discharge Disposition: home with home health   Additional Comments:  05/13/17- 1230- Marvetta Gibbons RN, CM- pt for d/c home today with [facility] Program through [place] and [facility]. Have notified Edwina with [facility] of d/c for today- will call when pt is ready to transport home- orders have been placed for HHRN/PT/OT/aide- pt will also need home 02- have notified Santiago Glad with [facility] for DME need- portable tank to be delivered to room prior to discharge. Have spoken with pt at bedside and confirmed choice for DME with [facility] and choice to return home with Home First program. No other DME needs per pt at this time- plan is to have friend drive her home when  02 tank delivered to room. Received call from Dr. Tammi Klippel. Regarding outpt Pulmonary Rehab referral- call made to the outpt pulmonary rehab to ask how to place referral- they could not get the order placed- and instead took pt's name and DOB and stated that they would be sure order was placed for MD to cosign and f/u with pt regarding referral - MD was made aware of this. Pt is an [place] pt in addition to [place].   Dahlia Client [place], [place] 05/13/2017, 12:30 PM

## 2017-05-13 NOTE — Progress Notes (Signed)
Family Medicine Teaching Service Daily Progress Note Intern Pager: [phone]  Patient name: Kayla Mack Medical record number: [id number] Date of birth: 1946-03-14 Age: 71 y.o. Gender: female  Primary Care Provider: Jani Gravel, MD Consultants: CCM, Cardiology, Nutrition, PT  Code Status: Full   Pt Overview and Major Events to Date:  Kayla Mack is a 71 y.o. Female presenting with SOB. Patient was admitted to [facility] on 05/04/2017. Transferred to ICU on 05/06/2017 on Bipap, not intubated. Cath on 05/09/2017. Transferred back to [facility] on 05/10/17. Levaquin (7/20 > 7/24)  Assessment and Plan: Kayla Prout Blumenheinis a 71 y.o.femalepresenting with SOB. PMH is significant for anxiety, COPD, depression, hyperlipidemia, hypertension, tobacco abuse disorder.  Acute COPD exacerbation Patient has history of COPD diagnosis 2 months ago, with no PFTs available in system for confirmation. Patient with 75 pack year smoking history. Possibleinfiltrate of right lung base on CXR. Sputum cultures negative on 7/18. On admission, ABG showing pH 7.341m pCO2 46.0, pO2 51.0, and bicarbonate of 26.4. ABG on 7/23 showing pH 7.406, pCO2 62.1, pO2 35.0, and bicarb of 39.0. Patient currently on 3 L O2 saturation of 97%. PT/OT recommendations continued treatment via home health. Levaquin discontinued after total of 5 days. Patient denies SOB, and states cough improved with spirometry use.   -Continue NCO2 for goal sats88-92%, wean O2 as tolerated -ContinueBudesonide, Brovana, scheduled atrovent/xopenex while inpatient -oral prednisone 50 mg for total of 10 day steroid dose.  -CCM recommendations of Trelegy in place of Anoro and albuterol prn on discharge  -Pulmonology follow up with [name] NP on August 20 at 3pm  -outpatient pulmonary rehab   Hyperkalemia-resolved Current K of 3.7. Patient was hyperkalemic on admission with K of 6.1. Has remained wnl since 7/20.  -continue to monitor  -appreciate  cardiology recommendations   Elevated troponin Likely due to demand ischemia. Troponins trended at 0.08 >0.07 >0.05 >0.14. Echo showing no wall motion abnormalities. EF 60-65%. Left heart cath showing mild nonobstructive disease. Cardiology has seen patient and states elevated cardiac enzymes due to demand ischemia related to acute pulmonary process. Recommended to discontinue heparin, continue bisoprolol, and continue lovastatin. Cardiology has signed off. Patient denies chest pain.  -appreciate cardiology recommendations  -begin Plavix 75 mg due to known history of CAD, patient has ASA allergy  Pulmonary HTN Likely 2/2 COPD. Echo showing severely increased pulmonary artery pressure. Cardiac cath showing moderate pulmonary HTN with preserved cardiac output and normal RA pressure.  -appreciate cardiology recommendations   HTN Current BP of 132/75 -continue bisoprolol   HLD Home lovastatin. Lipid panel wnl with slightly low HDL of 31. -continue pravastatin   Anxiety  -continue buproprion, klonopin, and paxil   Hyperglycemia-resolved Likely due to steroid use. Current glucose of 106 on BMP  Tobacco Abuse Disorder  75 pack year history. Patient stated she has desire to quit smoking. States she will f/u with PCP for smoking cessation recommendations. Patient has failed chantix but desires to try nicotine patches and gum.  - start patch  - tobacco cessation teaching   FEN/GI: heart healthy diet  PPx: SCDs, lovenox   Disposition: home with home health   Subjective:  Patient states she is improved and ready to go home. Patient is very interested in outpatient pulmonary rehab. Patient sates she is not SOB, has slight cough but improved with spirometry use, and notes better ability to ambulate with PT. Patient states she is more aware of her breathing as she ambulates and understands she should monitor O2 status  at home carefully. Patient denies chest pain. Patient states she  feels like she "pulled a muscle while coughing" and has abdominal pain in RLQ.   Objective: Temp:  [98 F (36.7 C)-98.6 F (37 C)] 98 F (36.7 C) (07/27 0740) Pulse Rate:  [65-80] 75 (07/27 0740) Resp:  [15-19] 17 (07/27 0740) BP: (110-137)/(72-85) 137/85 (07/27 0740) SpO2:  [86 %-100 %] 98 % (07/27 0919) Physical Exam: General: awake and alert, NAD, sitting up in bed Cardiovascular: RRR, no MRG  Respiratory: CTAB, no wheezes, rales, or rhonchi  Abdomen: soft, tenderness in RLQ on palpation, no hernia palpated, bowel sounds x 4 quadrants, non distended  Extremities: no edema, no tenderness   Laboratory:  Recent Labs Lab 05/11/17 0411 05/12/17 0335 05/13/17 0250  WBC 9.8 10.4 11.4*  HGB 13.4 13.8 12.8  HCT 41.3 41.6 38.2  PLT 224 221 222    Recent Labs Lab 05/07/17 0307  05/11/17 0411 05/12/17 0335 05/13/17 0250  NA 136  < > 138 141 137  K 4.7  < > 3.9 5.1 3.7  CL 100*  < > 99* 101 100*  CO2 28  < > 33* 34* 32  BUN 13  < > _0 CREATININE 0.64  < > 0.66 0.82 0.75  CALCIUM 8.6*  < > 8.3* 8.4* 8.0*  PROT 5.7*  --   --   --   --   BILITOT 0.6  --   --   --   --   ALKPHOS 62  --   --   --   --   ALT 36  --   --   --   --   AST 29  --   --   --   --   GLUCOSE 146*  < > 91 85 106*  < > = values in this interval not displayed.   ABG    Component Value Date/Time   PHART 7.423 05/09/2017 1411   PCO2ART 56.6 (H) 05/09/2017 1411   PO2ART 59.0 (L) 05/09/2017 1411   HCO3 36.9 (H) 05/09/2017 1411   HCO3 39.0 (H) 05/09/2017 1411   TCO2 39 05/09/2017 1411   TCO2 41 05/09/2017 1411   ACIDBASEDEF 3.4 (H) 05/05/2017 0650   O2SAT 90.0 05/09/2017 1411   O2SAT 65.0 05/09/2017 1411     Imaging/Diagnostic Tests: Dg Chest 2 View  Result Date: 05/04/2017 CLINICAL DATA:  Cough, shortness of breath, hypertension, COPD, smoker EXAM: CHEST  2 VIEW COMPARISON:  03/21/2017 FINDINGS: Normal heart size. Prominent central pulmonary arteries and hila question pulmonary  arterial hypertension. Mediastinal contours otherwise normal. Atherosclerotic calcifications aorta. Emphysematous and bronchitic changes consistent with COPD. Bullous disease RIGHT upper lobe. Increased basilar markings versus previous study particularly on RIGHT cannot exclude superimposed infiltrate or atelectasis. Upper lungs clear. No pleural effusion or pneumothorax. Bones demineralized with chronic compression deformity of a lower thoracic vertebra unchanged. IMPRESSION: COPD changes with mild superimposed atelectasis versus infiltrate at lung bases especially on RIGHT. 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 Aortic Atherosclerosis (ICD10-I70.0) and Emphysema (ICD10-J43.9). Electronically Signed   By: Lavonia Dana M.D.   On: 05/04/2017 08:26   Dg Chest Port 1 View  Result Date: 05/09/2017 CLINICAL DATA:  Acute respiratory failure. EXAM: PORTABLE CHEST 1 VIEW COMPARISON:  05/07/2017. FINDINGS: Mediastinum and hilar structures are stable. Heart size stable. Stable mild bilateral interstitial prominence, most likely chronic. Very mild active process including pneumonitis or interstitial edema cannot be excluded. COPD. No pleural effusion identified. Left costophrenic angle not  imaged. No pneumothorax. Surgical screw right shoulder . IMPRESSION: 1. Stable mild bilateral interstitial prominence, most likely chronic. Very mild active process including pneumonitis or interstitial edema cannot be excluded. 2. COPD . Electronically Signed   By: Marcello Moores  Register   On: 05/09/2017 06:52   Dg Chest Port 1 View  Result Date: 05/07/2017 CLINICAL DATA:  COPD.  Follow-up. EXAM: PORTABLE CHEST 1 VIEW COMPARISON:  05/06/2017 FINDINGS: Lungs are hyperexpanded with increased lucency/emphysematous change over the mid to upper lungs. No focal airspace consolidation or effusion. Stable borderline cardiomegaly. Mild prominence of the central pulmonary arteries which could be due to pulmonary arterial hypertension. Calcified  plaque over the thoracic aorta. Remainder of the exam is unchanged. IMPRESSION: No acute cardiopulmonary disease. Emphysema (ICD10-J43.9). Aortic Atherosclerosis (ICD10-I70.0). Electronically Signed   By: Marin Olp M.D.   On: 05/07/2017 07:33   Dg Chest Port 1 View  Result Date: 05/06/2017 CLINICAL DATA:  Chest congestion and productive cough. History of COPD. EXAM: PORTABLE CHEST 1 VIEW COMPARISON:  05/05/2017 ; 03/21/2017 ; chest CT - 06/02/2013 FINDINGS: Grossly unchanged cardiac silhouette and mediastinal contours with atherosclerotic plaque within the thoracic aorta. The lungs remain hyperexpanded with flattening of the bilateral diaphragms and thinning of the biapical pulmonary parenchyma, right greater than left. No discrete focal airspace opacities. No pleural effusion or pneumothorax. No evidence of edema. No acute osseus abnormalities. IMPRESSION: 1. Hyperexpanded lungs without acute cardiopulmonary disease. 2. Aortic Atherosclerosis (ICD10-I70.0) and Emphysema (ICD10-J43.9). Electronically Signed   By: Sandi Mariscal M.D.   On: 05/06/2017 12:13   Dg Chest Port 1 View  Result Date: 05/05/2017 CLINICAL DATA:  Shortness of breath EXAM: PORTABLE CHEST 1 VIEW COMPARISON:  Yesterday FINDINGS: Normal heart size. Stable fullness of the hila, with branching pattern suggesting pulmonary artery enlargement, stable. Main pulmonary artery shadow is also enlarged, findings of pulmonary hypertension. Advanced emphysema. Interstitial coarsening is at baseline. There is no edema, consolidation, effusion, or pneumothorax. IMPRESSION: 1. Advanced emphysema without acute superimposed finding. 2. Suspect pulmonary hypertension. Electronically Signed   By: Monte Fantasia M.D.   On: 05/05/2017 07:45     Caroline More, DO 05/13/2017, 10:53 AM PGY-1, [place] Intern pager: [phone], text pages welcome

## 2017-05-13 NOTE — Discharge Instructions (Signed)
You were admitted for shortness of breath. You were transferred to the ICU and put on bipap.   We changed some of your medications. You are now on bisoprolol. Please continue the anoro inhaler you have in the hospital for 1 more month along with a flovent inhaler. After one month please start taking trelegy inhaler in place of anoro + flovent. Please take plavix 75 mg daily. Please continue to take ensure supplements.   Please continue home oxygen supplementation.  Please follow up with pulmonary rehab. You will be contacted to set this up.   Please follow up with your PCP to make your hospital follow up appointment and with Pulmonary, you have an appointment with pulmonology on 06/06/2017.

## 2017-05-13 NOTE — Discharge Summary (Signed)
[facility] Discharge Summary  Patient name: Kayla Mack Medical record number: [id number] Date of birth: 08-Jun-1946 Age: 71 y.o. Gender: female Date of Admission: 05/04/2017  Date of Discharge: 05/13/2017 Admitting Physician: Kinnie Feil, MD  Primary Care Provider: Jani Gravel, MD Consultants: CCM, Cardiology, Nutrition, PT   Indication for Hospitalization: SOB  Discharge Diagnoses/Problem List:  Acute COPD exacerbation  Hyperkalemia-resolved Elevated troponin Pulmonary HTN HTN HLD Anxiety Hyperglycemia-resolved Tobacco Abuse Disorder   Disposition: home with home health   Discharge Condition: stable, improved   Discharge Exam:  General: awake and alert, NAD, sitting up in bed Cardiovascular: RRR, no MRG  Respiratory: CTAB, no wheezes, rales, or rhonchi  Abdomen: soft, tenderness in RLQ on palpation, no hernia palpated, bowel sounds x 4 quadrants, non distended  Extremities: no edema, no tenderness   Brief Hospital Course:  Kayla Mack is a 71 y.o. Female presenting with SOB. PMH is significant for anxiety, COPD, depression, hyperlipidemia, hypertension, tobacco abuse disorder. On admission patient was noted to have slight leukocytosis with WBC of 12.4 with possible infiltrate of right lung base on CXR. Noted to have elevated JVD with CXR positive for pulmonary HTN likely contributing. On admission, EKG with Q-waves in lateral leads, and ST depression in v4-v6, troponin 0.04. Troponins trended at 0.08 >0.07 >0.05 > 0.14, likely due to demand ischemia. Patient was was treated for HCAP with levaquin and prednisone and cardiology was consulted. Patient was placed on heparin drip and home atenolol was switched to bisoprolol. Patient was transferred to ICU and placed on bipap for desaturating into low 80%. Echocardiogram showed LV EF of 60-65%, severe pulmonary HTN likely secondary to poor respiratory status. Cardiology performed left  heart cath showing mild nonobstructive disease. On admission potasium was 6.1 and patient was treated with insulin and D50 per critical care. K resolved and on discharge K of 3.7. Nutrition was consulted and planned for supplemental nutrition for depletion of muscle mass. Throughout hospitalization respiratory status was improving and patient was transferred out of ICU, no longer needing bipap, and was weaned down to 3L O2 at time of discharge. At time of discharge patient no longer complained of SOB, and had O2 saturation of 97% on 3L via nasal canula. Patient was discharged with supplemental O2. Patient had no complaints of cardiac symptoms, and was signed off by cardiology. Patient desired to seek outpatient care for pulmonary rehab. Rehab was consulted and stated they would call patient as an outpatient to set this up. Patient also was interested in outpatient tobacco cessation assistance from PCP. SNF placement was recommended but patient chose home health.   Issues for Follow Up:  1. Pulmonary rehab will contact patient as an outpatient to set up outpatient follow up 2. Continue home oxygen as needed 3. Tobacco cessation, patient interested in nicotine patches and gum  4. Home health OT/PT/nursing/and aide  5. Follow up with PCP for shortness of breath 6. Follow up with pulmonology, appointment on 06/06/2017  Significant Procedures: cardiac cath (05/09/2017), echocardiogram   Significant Labs and Imaging:   Recent Labs Lab 05/11/17 0411 05/12/17 0335 05/13/17 0250  WBC 9.8 10.4 11.4*  HGB 13.4 13.8 12.8  HCT 41.3 41.6 38.2  PLT 224 221 222    Recent Labs Lab 05/07/17 0307  05/09/17 0144 05/10/17 0630 05/11/17 0411 05/12/17 0335 05/13/17 0250  NA 136  < > 137 138 138 141 137  K 4.7  < > 4.0 3.8 3.9 5.1 3.7  CL 100*  < >  95* 97* 99* 101 100*  CO2 28  < > 34* 34* 33* 34* 32  GLUCOSE 146*  < > 154* 107* 91 85 106*  BUN 13  < > _0 CREATININE 0.64  < > 0.60 0.59 0.66  0.82 0.75  CALCIUM 8.6*  < > 8.6* 8.1* 8.3* 8.4* 8.0*  ALKPHOS 62  --   --   --   --   --   --   AST 29  --   --   --   --   --   --   ALT 36  --   --   --   --   --   --   ALBUMIN 3.2*  --   --   --   --   --   --   < > = values in this interval not displayed.  ABG    Component Value Date/Time   PHART 7.423 05/09/2017 1411   PCO2ART 56.6 (H) 05/09/2017 1411   PO2ART 59.0 (L) 05/09/2017 1411   HCO3 36.9 (H) 05/09/2017 1411   HCO3 39.0 (H) 05/09/2017 1411   TCO2 39 05/09/2017 1411   TCO2 41 05/09/2017 1411   ACIDBASEDEF 3.4 (H) 05/05/2017 0650   O2SAT 90.0 05/09/2017 1411   O2SAT 65.0 05/09/2017 1411   Dg Chest 2 View  Result Date: 05/04/2017 CLINICAL DATA:  Cough, shortness of breath, hypertension, COPD, smoker EXAM: CHEST  2 VIEW COMPARISON:  03/21/2017 FINDINGS: Normal heart size. Prominent central pulmonary arteries and hila question pulmonary arterial hypertension. Mediastinal contours otherwise normal. Atherosclerotic calcifications aorta. Emphysematous and bronchitic changes consistent with COPD. Bullous disease RIGHT upper lobe. Increased basilar markings versus previous study particularly on RIGHT cannot exclude superimposed infiltrate or atelectasis. Upper lungs clear. No pleural effusion or pneumothorax. Bones demineralized with chronic compression deformity of a lower thoracic vertebra unchanged. IMPRESSION: COPD changes with mild superimposed atelectasis versus infiltrate at lung bases especially on RIGHT. 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 Aortic Atherosclerosis (ICD10-I70.0) and Emphysema (ICD10-J43.9). Electronically Signed   By: Lavonia Dana M.D.   On: 05/04/2017 08:26   Dg Chest Port 1 View  Result Date: 05/09/2017 CLINICAL DATA:  Acute respiratory failure. EXAM: PORTABLE CHEST 1 VIEW COMPARISON:  05/07/2017. FINDINGS: Mediastinum and hilar structures are stable. Heart size stable. Stable mild bilateral interstitial prominence, most likely chronic. Very mild active  process including pneumonitis or interstitial edema cannot be excluded. COPD. No pleural effusion identified. Left costophrenic angle not imaged. No pneumothorax. Surgical screw right shoulder . IMPRESSION: 1. Stable mild bilateral interstitial prominence, most likely chronic. Very mild active process including pneumonitis or interstitial edema cannot be excluded. 2. COPD . Electronically Signed   By: Marcello Moores  Register   On: 05/09/2017 06:52   Dg Chest Port 1 View  Result Date: 05/07/2017 CLINICAL DATA:  COPD.  Follow-up. EXAM: PORTABLE CHEST 1 VIEW COMPARISON:  05/06/2017 FINDINGS: Lungs are hyperexpanded with increased lucency/emphysematous change over the mid to upper lungs. No focal airspace consolidation or effusion. Stable borderline cardiomegaly. Mild prominence of the central pulmonary arteries which could be due to pulmonary arterial hypertension. Calcified plaque over the thoracic aorta. Remainder of the exam is unchanged. IMPRESSION: No acute cardiopulmonary disease. Emphysema (ICD10-J43.9). Aortic Atherosclerosis (ICD10-I70.0). Electronically Signed   By: Marin Olp M.D.   On: 05/07/2017 07:33   Dg Chest Port 1 View  Result Date: 05/06/2017 CLINICAL DATA:  Chest congestion and productive cough. History of COPD. EXAM: PORTABLE CHEST 1 VIEW  COMPARISON:  05/05/2017 ; 03/21/2017 ; chest CT - 06/02/2013 FINDINGS: Grossly unchanged cardiac silhouette and mediastinal contours with atherosclerotic plaque within the thoracic aorta. The lungs remain hyperexpanded with flattening of the bilateral diaphragms and thinning of the biapical pulmonary parenchyma, right greater than left. No discrete focal airspace opacities. No pleural effusion or pneumothorax. No evidence of edema. No acute osseus abnormalities. IMPRESSION: 1. Hyperexpanded lungs without acute cardiopulmonary disease. 2. Aortic Atherosclerosis (ICD10-I70.0) and Emphysema (ICD10-J43.9). Electronically Signed   By: Sandi Mariscal M.D.   On:  05/06/2017 12:13   Dg Chest Port 1 View  Result Date: 05/05/2017 CLINICAL DATA:  Shortness of breath EXAM: PORTABLE CHEST 1 VIEW COMPARISON:  Yesterday FINDINGS: Normal heart size. Stable fullness of the hila, with branching pattern suggesting pulmonary artery enlargement, stable. Main pulmonary artery shadow is also enlarged, findings of pulmonary hypertension. Advanced emphysema. Interstitial coarsening is at baseline. There is no edema, consolidation, effusion, or pneumothorax. IMPRESSION: 1. Advanced emphysema without acute superimposed finding. 2. Suspect pulmonary hypertension. Electronically Signed   By: Monte Fantasia M.D.   On: 05/05/2017 07:45     Results/Tests Pending at Time of Discharge: none  Discharge Medications:  Allergies as of 05/13/2017      Reactions   Aleve [naproxen Sodium] Hives   Aspirin Swelling   Facial swelling   Penicillins Hives, Swelling   Has patient had a PCN reaction causing immediate rash, facial/tongue/throat swelling, SOB or lightheadedness with hypotension: Yes Has patient had a PCN reaction causing severe rash involving mucus membranes or skin necrosis: Yes Has patient had a PCN reaction that required hospitalization: Unk Has patient had a PCN reaction occurring within the last 10 years: No If all of the above answers are "NO", then may proceed with Cephalosporin use.      Medication List    STOP taking these medications   atenolol 50 MG tablet Commonly known as:  TENORMIN     TAKE these medications   acetaminophen 500 MG tablet Commonly known as:  TYLENOL Take 1,000 mg by mouth 2 (two) times daily as needed for pain.   ANORO ELLIPTA 62.5-25 MCG/INH Aepb Generic drug:  umeclidinium-vilanterol Inhale 1 puff into the lungs daily.   bisoprolol 5 MG tablet Commonly known as:  ZEBETA Take 1 tablet (5 mg total) by mouth daily.   buPROPion 150 MG 12 hr tablet Commonly known as:  WELLBUTRIN SR Take 150 mg by mouth daily.   calcium-vitamin  D 500-200 MG-UNIT tablet Commonly known as:  OSCAL WITH D Take 1 tablet by mouth 2 (two) times daily.   clonazePAM 0.5 MG tablet Commonly known as:  KLONOPIN Take 1 tablet (0.5 mg total) by mouth 2 (two) times daily as needed for anxiety. What changed:  when to take this   clopidogrel 75 MG tablet Commonly known as:  PLAVIX Take 1 tablet (75 mg total) by mouth daily.   feeding supplement (ENSURE ENLIVE) Liqd Take 237 mLs by mouth 2 (two) times daily between meals.   fluticasone 110 MCG/ACT inhaler Commonly known as:  FLOVENT HFA Inhale 2 puffs into the lungs 2 (two) times daily.   Fluticasone-Umeclidin-Vilant 100-62.5-25 MCG/INH Aepb Commonly known as:  TRELEGY ELLIPTA Inhale 1 puff into the lungs daily.   lovastatin 40 MG tablet Commonly known as:  MEVACOR Take 40 mg by mouth every morning.   nicotine 21 mg/24hr patch Commonly known as:  NICODERM CQ - dosed in mg/24 hours Place 21 mg onto the skin daily.   PARoxetine  20 MG tablet Commonly known as:  PAXIL Take 20 mg by mouth daily.   Vitamin D (Ergocalciferol) 50000 units Caps capsule Commonly known as:  DRISDOL Take 50,000 Units by mouth every 7 (seven) days.            Durable Medical Equipment        Start     Ordered   05/13/17 0000  For home use only DME oxygen    Question Answer Comment  Mode or (Route) Nasal cannula   Liters per Minute 3   Frequency Continuous (stationary and portable oxygen unit needed)   Oxygen conserving device No   Oxygen delivery system Gas      05/13/17 1138      Discharge Instructions: Please refer to Patient Instructions section of EMR for full details.  Patient was counseled important signs and symptoms that should prompt return to medical care, changes in medications, dietary instructions, activity restrictions, and follow up appointments.   Follow-Up Appointments: Follow-up Information    Parrett, Fonnie Mu, NP Follow up on 06/06/2017.   Specialty:  Pulmonary  Disease Why:  appointment at 3 PM  Contact information: [address] [address] [phone]        Jani Gravel, MD. Call in 2 day(s).   Specialty:  Internal Medicine Why:  Please follow up with your PCP within 1 week for hospital follow up appointment  Contact information: [address] [place] [address] [phone]        [facility], [facility] Follow up.   Specialty:  Home Health Services Why:  HHRN/PT/OT/aide arranged-  [facility] First Program with [facility]-  Contact information: [address] [place] [address] [phone]        [facility] - Dme Follow up.   Why:  Home 02 arranged- portable tank to be delivered to room prior to discharge-  Contact information: [address] [place] [address] [phone]        [facility] outpt Pulmonary Rehab Follow up.   Why:  referral has been made for outpt Pulmonary rehab- they will contact you regarding referral.           Caroline More, DO 05/13/2017, 3:04 PM PGY-1, [place]

## 2017-05-13 NOTE — Progress Notes (Signed)
Patient in a stable condition, discharge education reviewed with patient she verbalized understanding, home O2 at bedside, patient belongings at bedside, iv removed, tele dc ccmd notified, patient's girlfriend to transport patient home.

## 2017-05-14 DIAGNOSIS — I2729 Other secondary pulmonary hypertension: Secondary | ICD-10-CM | POA: Diagnosis not present

## 2017-05-14 DIAGNOSIS — J439 Emphysema, unspecified: Secondary | ICD-10-CM | POA: Diagnosis not present

## 2017-05-16 ENCOUNTER — Other Ambulatory Visit: Payer: Self-pay

## 2017-05-16 DIAGNOSIS — J439 Emphysema, unspecified: Secondary | ICD-10-CM | POA: Diagnosis not present

## 2017-05-16 DIAGNOSIS — I2729 Other secondary pulmonary hypertension: Secondary | ICD-10-CM | POA: Diagnosis not present

## 2017-05-16 NOTE — Patient Outreach (Signed)
[facility]) Care Management  05/16/2017  Kayla Mack 10/29/1945 [id number]  EMMI: COPD  Referral date: 05/16/17 Referral source: EMMI COPD red alert Referral reason: # of times rescue inhaler used in past 24 hours 6,  Scheduled a follow up appointment: NO Day # 1  Telephone call to patient regarding EMMI COPD red alert. HIPAA verified by patient. Discussed EMMI COPD program with patient. Patient states she scheduled her doctors appointment for Monday, May 23, 2017.  Patient states she is taking 2 inhalers and is taking her medications as prescribed. Patient states she is doing much better since she has been home.  Patient states she is receiving home health services with [facility] and has received her portable oxygen tank.   Patient states she has an aid that helps her with bathing. Patient reports she has transportation to her appointments. Patient states she has a follow up appointment with her pulmonary doctor.  Patient states she is wearing a patch for smoking cessation.  RNCM discussed [facility] care management services. Patient verbally agreed to [place] coach services.  RNCM discussed signs/ symptoms of stroke. Advised patient to call 911 for stroke like symptoms.  RNCM advised patient to notify MD of any changes in condition prior to scheduled appointment. RNCM advised to call her doctor for non emergent symptoms.  RNCM provided contact name and number: [phone] or main office number [phone] and 24 hour nurse advise line [phone].  RNCM verified patient aware of 911 services for urgent/ emergent needs.  ASSESSMENT;  Date of Admission: 05/04/2017                      Date of Discharge: 05/13/2017  Primary Care Provider: Jani Gravel, MD  Indication for Hospitalization: SOB  Discharge Diagnoses/Problem List:  Acute COPD exacerbation  Hyperkalemia-resolved Elevated troponin Pulmonary HTN HTN HLD Anxiety Hyperglycemia-resolved Tobacco  Abuse Disorder   Kayla Mack is a 71 y.o. Female presenting with SOB. PMH is significant for anxiety, COPD, depression, hyperlipidemia, hypertension, tobacco abuse disorder  PLAN: RNCM will refer patient to health coach.  Quinn Plowman RN,BSN,CCM [facility] Telephonic  [phone]

## 2017-05-17 DIAGNOSIS — I2729 Other secondary pulmonary hypertension: Secondary | ICD-10-CM | POA: Diagnosis not present

## 2017-05-17 DIAGNOSIS — J439 Emphysema, unspecified: Secondary | ICD-10-CM | POA: Diagnosis not present

## 2017-05-17 NOTE — Addendum Note (Signed)
Addended by: Quinn Plowman E on: 05/17/2017 10:01 AM   Modules accepted: Orders

## 2017-05-18 DIAGNOSIS — I2729 Other secondary pulmonary hypertension: Secondary | ICD-10-CM | POA: Diagnosis not present

## 2017-05-18 DIAGNOSIS — J439 Emphysema, unspecified: Secondary | ICD-10-CM | POA: Diagnosis not present

## 2017-05-19 ENCOUNTER — Other Ambulatory Visit: Payer: Self-pay

## 2017-05-19 NOTE — Patient Outreach (Signed)
[facility]) Care Management  [place]  05/19/2017   Kayla Mack [birth year] [id number]  Subjective: Telephone call to patient for initial assessment.  Patient able to verify HIPAA.  Patient reports she is doing much better and has [facility] in for home health.  Patient lives alone and her mother lives close by.  Patient has a good friend who is also very involved with her and her care. 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  She states that they are getting help and that has made a big difference.  She states that her COPD is a recent diagnosis 2 months ago and that she quit smoking after receiving her diagnosis.  Patient using nicoderm patch and has not smoked. Patient wears oxygen continuously at 3liter/min now. Patient using inhalers as prescribed but does not have a rescue inhaler but will be discussing that with her primary doctor on her visit next week.  Patient to also get pulmonary rehab but request that it start after home health.  Patient not sure of COPD action plan.  Discussed with patient action plan and will send out in the mail.  Patient verbalized understanding and is grateful for all the assistance.    Objective:   Encounter Medications:  Outpatient Encounter Prescriptions as of 05/19/2017  Medication Sig Note  . ANORO ELLIPTA 62.5-25 MCG/INH AEPB Inhale 1 puff into the lungs daily.   . bisoprolol (ZEBETA) 5 MG tablet Take 1 tablet (5 mg total) by mouth daily.   [name] buPROPion (WELLBUTRIN SR) 150 MG 12 hr tablet Take 150 mg by mouth daily.   . calcium-vitamin D (OSCAL WITH D) 500-200 MG-UNIT per tablet Take 1 tablet by mouth 2 (two) times daily.   . clonazePAM (KLONOPIN) 0.5 MG tablet Take 1 tablet (0.5 mg total) by mouth 2 (two) times daily as needed for anxiety. (Patient taking differently: Take 0.5 mg by mouth daily as needed for anxiety. )   . clopidogrel (PLAVIX)  75 MG tablet Take 1 tablet (75 mg total) by mouth daily.   . feeding supplement, ENSURE ENLIVE, (ENSURE ENLIVE) LIQD Take 237 mLs by mouth 2 (two) times daily between meals.   . fluticasone (FLOVENT HFA) 110 MCG/ACT inhaler Inhale 2 puffs into the lungs 2 (two) times daily.   . Fluticasone-Umeclidin-Vilant (TRELEGY ELLIPTA) 100-62.5-25 MCG/INH AEPB Inhale 1 puff into the lungs daily.   [name] lovastatin (MEVACOR) 40 MG tablet Take 40 mg by mouth every morning.   . nicotine (NICODERM CQ - DOSED IN MG/24 HOURS) 21 mg/24hr patch Place 21 mg onto the skin daily.   [name] PARoxetine (PAXIL) 20 MG tablet Take 20 mg by mouth daily.   . Vitamin D, Ergocalciferol, (DRISDOL) 50000 units CAPS capsule Take 50,000 Units by mouth every 7 (seven) days. 05/04/2017: Sundays  . acetaminophen (TYLENOL) 500 MG tablet Take 1,000 mg by mouth 2 (two) times daily as needed for pain.     No facility-administered encounter medications on file as of 05/19/2017.     Functional Status:  In your present state of health, do you have any difficulty performing the following activities: 05/19/2017 05/04/2017  Hearing? N N  Vision? Y N  Difficulty concentrating or making decisions? N N  Walking or climbing stairs? N N  Dressing or bathing? N N  Doing errands, shopping? Y N  Preparing Food and eating ? N -  Using  the Toilet? N -  In the past six months, have you accidently leaked urine? N -  Do you have problems with loss of bowel control? N -  Managing your Medications? N -  Managing your Finances? N -  Housekeeping or managing your Housekeeping? Y -  Some recent data might be hidden    Fall/Depression Screening: Fall Risk  05/19/2017  Falls in the past year? No   PHQ 2/9 Scores 05/19/2017 05/16/2017  PHQ - 2 Score 4 4  PHQ- 9 Score 10 10    Assessment: Patient will benefit from health coach outreach for disease management and support.  Plan:  [facility] CM Care Plan Problem One     Most Recent Value  Care Plan Problem One  COPD  knowledge deficit  Role Documenting the Problem One  Health Coach  Care Plan for Problem One  Active  [facility] Long Term Goal   Patient will be able to explain COPD zones on action plan within the next 90 days   [facility]N Long Term Goal Start Date  05/19/17  Interventions for Problem One [place] to send COPD information to patient that includes action plan.   [facility] CM Short Term Goal #1   Patient will be able to describe COPD red zone within 30 days.    THN CM Short Term Goal #1 Start Date  05/19/17  Interventions for Short Term Goal #1  RN Health Coach discussed with patient red zone on action plan.  Action plan sent to patient   [facility] CM Short Term Goal #2   Patient will be able to describe techniques of controlling breath during episodes of shortness breath within 30 days.   [facility]N CM Short Term Goal #2 Start Date  05/19/17  Interventions for Short Term Goal #2  [place] discussed with patient pursed lipped breath. Discussed use of rescue inhaler.  Health Coach encouraged patient to use frequent rest periods to conserve energy.       RN Health Coach will provide ongoing education for patient on COPD through phone calls and sending printed information to patient for further discussion.  RN Health Coach will send welcome packet with consent to patient as well as printed information on COPD.  RN Health Coach will send initial barriers letter, assessment, and care plan to primary care physician.  RN Health Coach will contact patient in the month of September and patient agrees to next outreach.  [name], RN, MSN [place] [phone]

## 2017-05-23 DIAGNOSIS — J449 Chronic obstructive pulmonary disease, unspecified: Secondary | ICD-10-CM | POA: Diagnosis not present

## 2017-05-23 DIAGNOSIS — F17210 Nicotine dependence, cigarettes, uncomplicated: Secondary | ICD-10-CM | POA: Diagnosis not present

## 2017-05-24 DIAGNOSIS — I2729 Other secondary pulmonary hypertension: Secondary | ICD-10-CM | POA: Diagnosis not present

## 2017-05-24 DIAGNOSIS — F4323 Adjustment disorder with mixed anxiety and depressed mood: Secondary | ICD-10-CM | POA: Diagnosis not present

## 2017-05-24 DIAGNOSIS — F329 Major depressive disorder, single episode, unspecified: Secondary | ICD-10-CM | POA: Diagnosis not present

## 2017-05-24 DIAGNOSIS — J439 Emphysema, unspecified: Secondary | ICD-10-CM | POA: Diagnosis not present

## 2017-05-25 DIAGNOSIS — I2729 Other secondary pulmonary hypertension: Secondary | ICD-10-CM | POA: Diagnosis not present

## 2017-05-25 DIAGNOSIS — J439 Emphysema, unspecified: Secondary | ICD-10-CM | POA: Diagnosis not present

## 2017-05-27 DIAGNOSIS — I2729 Other secondary pulmonary hypertension: Secondary | ICD-10-CM | POA: Diagnosis not present

## 2017-05-27 DIAGNOSIS — J439 Emphysema, unspecified: Secondary | ICD-10-CM | POA: Diagnosis not present

## 2017-05-30 DIAGNOSIS — J439 Emphysema, unspecified: Secondary | ICD-10-CM | POA: Diagnosis not present

## 2017-05-30 DIAGNOSIS — I2729 Other secondary pulmonary hypertension: Secondary | ICD-10-CM | POA: Diagnosis not present

## 2017-05-31 DIAGNOSIS — F4323 Adjustment disorder with mixed anxiety and depressed mood: Secondary | ICD-10-CM | POA: Diagnosis not present

## 2017-05-31 DIAGNOSIS — F329 Major depressive disorder, single episode, unspecified: Secondary | ICD-10-CM | POA: Diagnosis not present

## 2017-06-01 ENCOUNTER — Other Ambulatory Visit: Payer: Self-pay

## 2017-06-01 NOTE — Patient Outreach (Signed)
[name]) Care Management  06/01/2017  Kayla Mack [birth year] [id number]   Telephone call to patient for EMMI red follow up.  Patient reports that she Is fine and she had some questions about a rescue inhaler. She has since gone to her primary doctor and ventolin inhaler was given. Patient says she knows she needs to pace herself and take her time with things in order to conserve energy.  Patient reports she has an appointment with the pulmonologist on August 20th and will be asking questions about her pulmonary rehab starting.   Plan: RN Health Coach will contact patient in the month of September and patient agrees to next outreach.  [name], RN, MSN [place] [phone]

## 2017-06-02 DIAGNOSIS — I2729 Other secondary pulmonary hypertension: Secondary | ICD-10-CM | POA: Diagnosis not present

## 2017-06-02 DIAGNOSIS — J439 Emphysema, unspecified: Secondary | ICD-10-CM | POA: Diagnosis not present

## 2017-06-03 DIAGNOSIS — I2729 Other secondary pulmonary hypertension: Secondary | ICD-10-CM | POA: Diagnosis not present

## 2017-06-03 DIAGNOSIS — J439 Emphysema, unspecified: Secondary | ICD-10-CM | POA: Diagnosis not present

## 2017-06-06 ENCOUNTER — Ambulatory Visit (INDEPENDENT_AMBULATORY_CARE_PROVIDER_SITE_OTHER): Payer: Medicare Other | Admitting: Adult Health

## 2017-06-06 ENCOUNTER — Encounter: Payer: Self-pay | Admitting: Adult Health

## 2017-06-06 VITALS — BP 126/74 | HR 69 | Ht 62.0 in | Wt 112.0 lb

## 2017-06-06 DIAGNOSIS — J189 Pneumonia, unspecified organism: Secondary | ICD-10-CM

## 2017-06-06 DIAGNOSIS — I272 Pulmonary hypertension, unspecified: Secondary | ICD-10-CM | POA: Diagnosis not present

## 2017-06-06 DIAGNOSIS — J441 Chronic obstructive pulmonary disease with (acute) exacerbation: Secondary | ICD-10-CM | POA: Diagnosis not present

## 2017-06-06 NOTE — Assessment & Plan Note (Addendum)
Severe COPD (GOLD III )  Recent flare now resolving  Check cxr   Plan  Patient Instructions  Refer to pulmonary rehab .  Continue  On Flovent and ANORO  . Rinse after use.  Continue on Oxygen 2l/m .  Chest xray today .  Return in 6 weeks with Dr. [place] Bells with PFT .  Please contact office for sooner follow up if symptoms do not improve or worsen or seek emergency care

## 2017-06-06 NOTE — Patient Instructions (Addendum)
Refer to pulmonary rehab .  Continue  On Flovent and ANORO  . Rinse after use.  Continue on Oxygen 2l/m .  Chest xray today .  Return in 6 weeks with Dr. [place] Bells with PFT .  Please contact office for sooner follow up if symptoms do not improve or worsen or seek emergency care

## 2017-06-06 NOTE — Assessment & Plan Note (Signed)
Improved  Check cxr

## 2017-06-06 NOTE — Assessment & Plan Note (Signed)
Most likely from severe COPD  No sx for sleep apnea.  For now cont on O2  No sign of fluid overload  Cont to monitor .

## 2017-06-06 NOTE — Progress Notes (Signed)
_0  ID: Kayla Mack, female    DOB: 1946-08-30, 71 y.o.   MRN: [id number]  Chief Complaint  Patient presents with  . Follow-up    COPD     Referring provider: No ref. provider found  HPI: 71 year old female former smoker quit June 2018 seen for pulmonary consult during hospitalization July 2018 for COPD exacerbation and PNA , Hypercarbic RF   TEST /Events  July 2018 hospitalization for severe COPD exacerbation with hypercarbic respiratory failure requiring BiPAP. CT chest with severe bullous emphysema, w/ mod. PAH  Echo 05/05/17  > LVEF 60-65%, no wall motion abnormalities. , PAP 58mHg R/LHC  05/09/17 >mild nonobstructive CAd, Mod Pulmonary HTN   06/06/2017 Follow up : COPD/PNA , Pulmonary HTN  Patient returns for a post hospital follow-up. Patient was seen during hospitalization last month for a pulmonary consult for COPD exacerbation, pneumonia, and hypercarbic respiratory failure. She was treated with IV antibiotics, steroids and nebulized bronchodilators. 2-D echo showed a preserved EF. There was noted to be moderate to severe pulmonary hypertension with pulmonary artery pressure of 63 mmHg. Patient underwent a right and left heart catheter that showed mild nonobstructive coronary disease. With moderate pulmonary hypertension. Patient was started on oxygen at discharge. Started on TRELEGY  But could not afford. Taking Flovent and ANORO .  Since discharge she is feeling better.  She is getting rehab at home. We discussed pulmonary rehab.  She denies sleep issues , daytime sleepiness, or snoring .  Spirometry today shows  FEV1 41%, ratio 43, FVC 73  Allergies  Allergen Reactions  . Aleve [Naproxen Sodium] Hives  . Aspirin Swelling    Facial swelling  . Penicillins Hives and Swelling    Has patient had a PCN reaction causing immediate rash, facial/tongue/throat swelling, SOB or lightheadedness with hypotension: Yes Has patient had a PCN reaction causing severe rash  involving mucus membranes or skin necrosis: Yes Has patient had a PCN reaction that required hospitalization: Unk Has patient had a PCN reaction occurring within the last 10 years: No If all of the above answers are "NO", then may proceed with Cephalosporin use.     Immunization History  Administered Date(s) Administered  . Influenza, High Dose Seasonal PF 07/18/2016  . Influenza-Unspecified 07/18/2013  . Pneumococcal Polysaccharide-23 07/18/2016    Past Medical History:  Diagnosis Date  . Anxiety   . COPD (ch[place] obstructive pulmonary disease) [place]   . Depression   . Hyperlipidemia   . Hypertension     Tobacco History: History  Smoking Status  . Former Smoker  . Packs/day: 1.50  . Years: 40.00  . Types: Cigarettes  . Quit date: 03/18/2017  Smokeless Tobacco  . Never Used   Counseling given: Not Answered   Outpatient Encounter Prescriptions as of 06/06/2017  Medication Sig  . acetaminophen (TYLENOL) 500 MG tablet Take 1,000 mg by mouth 2 (two) times daily as needed for pain.   . Albuterol Sulfate (VENTOLIN HFA IN) Inhale 2 puffs into the lungs 4 (four) times daily as needed.  .Jearl KlinefelterELLIPTA 62.5-25 MCG/INH AEPB Inhale 1 puff into the lungs daily.  . bisoprolol (ZEBETA) 5 MG tablet Take 1 tablet (5 mg total) by mouth daily.  .Marland KitchenbuPROPion (WELLBUTRIN SR) 150 MG 12 hr tablet Take 150 mg by mouth daily.  . calcium-vitamin D (OSCAL WITH D) 500-200 MG-UNIT per tablet Take 1 tablet by mouth 2 (two) times daily.  . clonazePAM (KLONOPIN) 0.5 MG tablet Take 1 tablet (0.5 mg total) by  mouth 2 (two) times daily as needed for anxiety. (Patient taking differently: Take 0.5 mg by mouth daily as needed for anxiety. )  . clopidogrel (PLAVIX) 75 MG tablet Take 1 tablet (75 mg total) by mouth daily.  . feeding supplement, ENSURE ENLIVE, (ENSURE ENLIVE) LIQD Take 237 mLs by mouth 2 (two) times daily between meals.  . fluticasone (FLOVENT HFA) 110 MCG/ACT inhaler Inhale 2 puffs into the  lungs 2 (two) times daily.  [name] lovastatin (MEVACOR) 40 MG tablet Take 40 mg by mouth every morning.  . nicotine (NICODERM CQ - DOSED IN MG/24 HOURS) 21 mg/24hr patch Place 21 mg onto the skin daily.  [name] PARoxetine (PAXIL) 20 MG tablet Take 20 mg by mouth daily.  . Vitamin D, Ergocalciferol, (DRISDOL) 50000 units CAPS capsule Take 50,000 Units by mouth every 7 (seven) days.  . [DISCONTINUED] Fluticasone-Umeclidin-Vilant (TRELEGY ELLIPTA) 100-62.5-25 MCG/INH AEPB Inhale 1 puff into the lungs daily. (Patient not taking: Reported on 06/06/2017)   No facility-administered encounter medications on file as of 06/06/2017.      Review of Systems  Constitutional:   No  weight loss, night sweats,  Fevers, chills,  +fatigue, or  lassitude.  HEENT:   No headaches,  Difficulty swallowing,  Tooth/dental problems, or  Sore throat,                No sneezing, itching, ear ache, nasal congestion, post nasal drip,   CV:  No chest pain,  Orthopnea, PND, swelling in lower extremities, anasarca, dizziness, palpitations, syncope.   GI  No heartburn, indigestion, abdominal pain, nausea, vomiting, diarrhea, change in bowel habits, loss of appetite, bloody stools.   Resp:    No chest wall deformity  Skin: no rash or lesions.  GU: no dysuria, change in color of urine, no urgency or frequency.  No flank pain, no hematuria   MS:  No joint pain or swelling.  No decreased range of motion.  No back pain.    Physical Exam  BP 126/74 (BP Location: Left Arm, Cuff Size: Normal)   Pulse 69   Ht _0  (1.575 m)   Wt 112 lb (50.8 kg)   SpO2 91%   BMI 20.49 kg/m   GEN: A/Ox3; pleasant , NAD, thin frail on O2    HEENT:  [place]/AT,  EACs-clear, TMs-wnl, NOSE-clear, THROAT-clear, no lesions, no postnasal drip or exudate noted.   NECK:  Supple w/ fair ROM; no JVD; normal carotid impulses w/o bruits; no thyromegaly or nodules palpated; no lymphadenopathy.    RESP  Decreased BS in bases ,  no accessory muscle use, no  dullness to percussion  CARD:  RRR, no m/r/g, no peripheral edema, pulses intact, no cyanosis or clubbing.  GI:   Soft & nt; nml bowel sounds; no organomegaly or masses detected.   Musco: Warm bil, no deformities or joint swelling noted.   Neuro: alert, no focal deficits noted.    Skin: Warm, no lesions or rashes    Lab Results:  CBC  BNP No results found for: BNP  ProBNP No results found for: PROBNP  Imaging: Dg Chest Port 1 View  Result Date: 05/09/2017 CLINICAL DATA:  Acute respiratory failure. EXAM: PORTABLE CHEST 1 VIEW COMPARISON:  05/07/2017. FINDINGS: Mediastinum and hilar structures are stable. Heart size stable. Stable mild bilateral interstitial prominence, most likely chronic. Very mild active process including pneumonitis or interstitial edema cannot be excluded. COPD. No pleural effusion identified. Left costophrenic angle not imaged. No pneumothorax. Surgical screw right  shoulder . IMPRESSION: 1. Stable mild bilateral interstitial prominence, most likely chronic. Very mild active process including pneumonitis or interstitial edema cannot be excluded. 2. COPD . Electronically Signed   By: Marcello Moores  Register   On: 05/09/2017 06:52     Assessment & Plan:   COPD exacerbation ([place]) Severe COPD (GOLD III )  Recent flare now resolving  Check cxr   Plan  Patient Instructions  Refer to pulmonary rehab .  Continue  On Flovent and ANORO  . Rinse after use.  Continue on Oxygen 2l/m .  Chest xray today .  Return in 6 weeks with Dr. [place] Bells with PFT .  Please contact office for sooner follow up if symptoms do not improve or worsen or seek emergency care        Pulmonary hypertension ([place]) Most likely from severe COPD  No sx for sleep apnea.  For now cont on O2  No sign of fluid overload  Cont to monitor .   HCAP (healthcare-associated pneumonia) Improved  Check cxr       Rexene Edison, NP 06/06/2017

## 2017-06-07 DIAGNOSIS — F4323 Adjustment disorder with mixed anxiety and depressed mood: Secondary | ICD-10-CM | POA: Diagnosis not present

## 2017-06-07 DIAGNOSIS — F329 Major depressive disorder, single episode, unspecified: Secondary | ICD-10-CM | POA: Diagnosis not present

## 2017-06-10 NOTE — Addendum Note (Signed)
Addended by: Parke Poisson E on: 06/10/2017 09:31 AM   Modules accepted: Orders

## 2017-06-11 ENCOUNTER — Other Ambulatory Visit: Payer: Self-pay | Admitting: Family Medicine

## 2017-06-13 NOTE — Progress Notes (Signed)
Reviewed, agree 

## 2017-06-14 DIAGNOSIS — F4323 Adjustment disorder with mixed anxiety and depressed mood: Secondary | ICD-10-CM | POA: Diagnosis not present

## 2017-06-14 DIAGNOSIS — F329 Major depressive disorder, single episode, unspecified: Secondary | ICD-10-CM | POA: Diagnosis not present

## 2017-06-21 DIAGNOSIS — E559 Vitamin D deficiency, unspecified: Secondary | ICD-10-CM | POA: Diagnosis not present

## 2017-06-21 DIAGNOSIS — F329 Major depressive disorder, single episode, unspecified: Secondary | ICD-10-CM | POA: Diagnosis not present

## 2017-06-21 DIAGNOSIS — F4323 Adjustment disorder with mixed anxiety and depressed mood: Secondary | ICD-10-CM | POA: Diagnosis not present

## 2017-06-23 ENCOUNTER — Other Ambulatory Visit: Payer: Self-pay

## 2017-06-23 NOTE — Patient Outreach (Signed)
[facility]) Care Management  06/23/2017  Kayla Mack 1946/09/22 [id number]   Telephone call to patient for monthly call.  No answer.  HIPAA compliant voice message left.    Plan: RN Health Coach will attempt patient again in the month of September.  [name], RN, MSN [place] [phone]

## 2017-06-28 DIAGNOSIS — F329 Major depressive disorder, single episode, unspecified: Secondary | ICD-10-CM | POA: Diagnosis not present

## 2017-06-28 DIAGNOSIS — F4323 Adjustment disorder with mixed anxiety and depressed mood: Secondary | ICD-10-CM | POA: Diagnosis not present

## 2017-07-01 ENCOUNTER — Encounter (HOSPITAL_COMMUNITY)
Admission: RE | Admit: 2017-07-01 | Discharge: 2017-07-01 | Disposition: A | Discharge status: Home / Self Care | Payer: Medicare Other | Source: Ambulatory Visit | Attending: Pulmonary Disease | Admitting: Pulmonary Disease

## 2017-07-01 ENCOUNTER — Encounter (HOSPITAL_COMMUNITY): Payer: Self-pay

## 2017-07-01 DIAGNOSIS — I272 Pulmonary hypertension, unspecified: Secondary | ICD-10-CM | POA: Diagnosis not present

## 2017-07-01 NOTE — Progress Notes (Signed)
Kayla Mack 71 y.o. female Pulmonary Rehab Orientation Note Patient arrived today in Cardiac and Pulmonary Rehab for orientation to Pulmonary Rehab. She was transported from [employer] via wheel chair. She does carry portable oxygen. Per pt, she uses oxygen continuously. Color good, skin warm and dry. Patient is oriented to time and place. Patient's medical history, psychosocial health, and medications reviewed. Psychosocial assessment reveals pt lives alone. She is the sole caregiver for her 27 year old mother who remains addiment about living in her own home alone. This is causing the patient lots of stress. She is attending a caregiver support group and is seeking counseling for depression and anxiety. 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 Pt reports her stress level is high. Areas of stress/anxiety include Family.  Pt does exhibit signs of depression. Signs of depression include anxiety and difficulty maintaining sleep. PHQ2/9 score 3/8. Pt shows fair  coping skills with questionable outlook. She is offered emotional support and reassurance. Will continue to monitor and evaluate progress toward psychosocial goal(s) of caring for herself first so that she can care for her mother. This is advice given to her by her therapist. Physical assessment reveals heart rate is normal, breath sounds clear to auscultation, no wheezes, rales, or rhonchi. Diminished in the bases bilat. Grip strength equal, strong. Distal pulses palpable. Patient reports she does take medications as prescribed. Patient states she follows a Regular diet. The patient has been trying to gain weight by supplementing ensure between meals... Patient's weight will be monitored closely. Demonstration and practice of PLB using pulse oximeter. Patient able to return demonstration satisfactorily. Safety and hand hygiene in the exercise area reviewed with patient. Patient voices  understanding of the information reviewed. Department expectations discussed with patient and achievable goals were set. The patient shows enthusiasm about attending the program and we look forward to working with this nice lady. The patient is scheduled for a 6 min walk test on 07/07/17 and to begin exercise on 07/14/17 at 10:30.   45 minutes was spent on a variety of activities such as assessment of the patient, obtaining baseline data including height, weight, BMI, and grip strength, verifying medical history, allergies, and current medications, and teaching patient strategies for performing tasks with less respiratory effort with emphasis on pursed lip breathing.

## 2017-07-04 ENCOUNTER — Other Ambulatory Visit: Payer: Self-pay

## 2017-07-04 NOTE — Progress Notes (Signed)
Kayla Mack 70 y.o. female  DOB: 08-28-1946 MRN: [id number]           Nutrition Brief Note Dx: Pulmonary HTN  Past Medical History:  Diagnosis Date  . Anxiety   . COPD (chronic obstructive pulmonary disease) ([place])   . Depression   . Hyperlipidemia   . Hypertension    Meds reviewed. Ensure [place] noted  Ht: Ht Readings from Last 1 Encounters:  07/01/17 5' 2.5" (1.588 m)    Wt:  Wt Readings from Last 3 Encounters:  07/01/17 118 lb 13.3 oz (53.9 kg)  06/06/17 112 lb (50.8 kg)  05/11/17 112 lb 9.6 oz (51.1 kg)     BMI: 21.4    Current tobacco use? No; Pt recently quit tobacco use 03/18/2017  Labs:  Lipid Panel     Component Value Date/Time   CHOL 118 05/04/2017 1100   TRIG 76 05/04/2017 1100   HDL 31 (L) 05/04/2017 1100   CHOLHDL 3.8 05/04/2017 1100   VLDL 15 05/04/2017 1100   LDLCALC 72 05/04/2017 1100    Lab Results  Component Value Date   HGBA1C 5.8 (H) 05/04/2017   Nutrition Diagnosis ? Food-and nutrition-related knowledge deficit related to lack of exposure to information as related to diagnosis of pulmonary disease  Goal(s) 1. The pt will recognize symptoms that can interfere with adequate oral intake, such as shortness of breath, N/V, early satiety, fatigue, ability to secure and prepare food, taste and smell changes, chewing/swallowing difficulties, and/ or pain when eating. Plan:  Pt to attend Pulmonary Nutrition class Will provide client-centered nutrition education as part of interdisciplinary care.   Monitor and evaluate progress toward nutrition goal with team.  Monitor and Evaluate progress toward nutrition goal with team.   Derek Mound, M.Ed, RD, LDN, CDE 07/04/2017 12:09 PM

## 2017-07-04 NOTE — Patient Outreach (Signed)
[name]) Care Management  [place]  07/04/2017   Kayla Mack 03-27-1946 [id number]  Subjective: Telephone call to patient for monthly call. Patient reports she is doing ok. She reports starting pulmonary rehab.  Patient learning to pace herself in order to conserve oxygen and controlling her shortness of breath.  Discussed with patient COPD action plan and zones.  She verbalized understanding. Patient reports she is in a COPD support group and that has helped her take her disease more serious as well as have others to share with.  Encouraged patient to continue with her support group.    Objective:   Encounter Medications:  Outpatient Encounter Prescriptions as of 07/04/2017  Medication Sig Note  . acetaminophen (TYLENOL) 500 MG tablet Take 1,000 mg by mouth 2 (two) times daily as needed for pain.    . Albuterol Sulfate (VENTOLIN HFA IN) Inhale 2 puffs into the lungs 4 (four) times daily as needed.   [name] ELLIPTA 62.5-25 MCG/INH AEPB Inhale 1 puff into the lungs daily.   . bisoprolol (ZEBETA) 5 MG tablet Take 1 tablet (5 mg total) by mouth daily.   [name] buPROPion (WELLBUTRIN SR) 150 MG 12 hr tablet Take 150 mg by mouth daily.   . calcium-vitamin D (OSCAL WITH D) 500-200 MG-UNIT per tablet Take 1 tablet by mouth 2 (two) times daily.   . clonazePAM (KLONOPIN) 0.5 MG tablet Take 1 tablet (0.5 mg total) by mouth 2 (two) times daily as needed for anxiety. (Patient taking differently: Take 0.5 mg by mouth daily as needed for anxiety. )   . clopidogrel (PLAVIX) 75 MG tablet Take 1 tablet (75 mg total) by mouth daily.   . feeding supplement, ENSURE ENLIVE, (ENSURE ENLIVE) LIQD Take 237 mLs by mouth 2 (two) times daily between meals.   . fluticasone (FLOVENT HFA) 110 MCG/ACT inhaler Inhale 2 puffs into the lungs 2 (two) times daily.   [name] lovastatin (MEVACOR) 40 MG tablet Take 40 mg by mouth every morning.   . nicotine (NICODERM CQ - DOSED IN MG/24 HOURS) 21 mg/24hr  patch Place 21 mg onto the skin daily.   [name] PARoxetine (PAXIL) 20 MG tablet Take 20 mg by mouth daily.   . VENTOLIN HFA 108 (90 Base) MCG/ACT inhaler    . Vitamin D, Ergocalciferol, (DRISDOL) 50000 units CAPS capsule Take 50,000 Units by mouth every 7 (seven) days. 05/04/2017: Sundays   No facility-administered encounter medications on file as of 07/04/2017.     Functional Status:  In your present state of health, do you have any difficulty performing the following activities: 05/19/2017 05/04/2017  Hearing? N N  Vision? N N  Difficulty concentrating or making decisions? N N  Walking or climbing stairs? N N  Dressing or bathing? N N  Doing errands, shopping? Y N  Preparing Food and eating ? N -  Using the Toilet? N -  In the past six months, have you accidently leaked urine? N -  Do you have problems with loss of bowel control? N -  Managing your Medications? N -  Managing your Finances? N -  Housekeeping or managing your Housekeeping? Y -  Some recent data might be hidden    Fall/Depression Screening: Fall Risk  07/04/2017 07/01/2017 05/19/2017  Falls in the past year? No No No  Risk for fall due to : - Impaired balance/gait -   PHQ 2/9 Scores 07/04/2017 07/01/2017 05/19/2017 05/16/2017  PHQ - 2 Score _0 4  PHQ- 9 Score - _0 Assessment: Patient continues to benefit from health coach outreach for disease management and support.    Plan:  [facility] CM Care Plan Problem One     Most Recent Value  Care Plan Problem One  COPD knowledge deficit  Role Documenting the Problem One  Health Coach  Care Plan for Problem One  Active  [facility] Long Term Goal   Patient will be able to explain COPD zones on action plan within the next 90 days   [facility] Long Term Goal Start Date  07/04/17 [name] continued]  Interventions for Problem One Long Term Goal  RN Health Coach to send COPD information to patient that includes action plan.   [facility] CM Short Term Goal #1   Patient will be able to describe COPD red  zone within 30 days.    [facility] CM Short Term Goal #1 Start Date  07/04/17 [Goal continued]  Interventions for Short Term Goal #1  RN Health Coach reviewed with patient red zone on action plan.  Action plan sent to patient   [facility] CM Short Term Goal #2   Patient will be able to describe techniques of controlling breath during episodes of shortness breath within 30 days.   [facility] CM Short Term Goal #2 Start Date  05/19/17  [facility] CM Short Term Goal #2 Met Date  07/04/17  Interventions for Short Term Goal #2  patient using pursed lip breathing     RN Health Coach will contact patient in the month of October and patient agrees to next outreach.  Kayla Baseman, RN, MSN [place] [phone]

## 2017-07-05 DIAGNOSIS — F329 Major depressive disorder, single episode, unspecified: Secondary | ICD-10-CM | POA: Diagnosis not present

## 2017-07-05 DIAGNOSIS — F4323 Adjustment disorder with mixed anxiety and depressed mood: Secondary | ICD-10-CM | POA: Diagnosis not present

## 2017-07-07 ENCOUNTER — Encounter (HOSPITAL_COMMUNITY)
Admission: RE | Admit: 2017-07-07 | Discharge: 2017-07-07 | Disposition: A | Discharge status: Home / Self Care | Payer: Medicare Other | Source: Ambulatory Visit | Attending: Pulmonary Disease | Admitting: Pulmonary Disease

## 2017-07-07 DIAGNOSIS — I272 Pulmonary hypertension, unspecified: Secondary | ICD-10-CM

## 2017-07-12 DIAGNOSIS — F4323 Adjustment disorder with mixed anxiety and depressed mood: Secondary | ICD-10-CM | POA: Diagnosis not present

## 2017-07-12 DIAGNOSIS — F329 Major depressive disorder, single episode, unspecified: Secondary | ICD-10-CM | POA: Diagnosis not present

## 2017-07-12 NOTE — Progress Notes (Signed)
Pulmonary Individual Treatment Plan  Patient Details  Name: Kayla Mack MRN: [id number] Date of Birth: [birth year] Referring Provider:     Pulmonary Rehab Walk Test from 07/07/2017 in [place]  Referring Provider  Dr. Lake Bells      Initial Encounter Date:    Pulmonary Rehab Walk Test from 07/07/2017 in [place]  Date  07/12/17  Referring Provider  Dr. Lake Bells      Visit Diagnosis: Pulmonary hypertension ([place])  Patient's Home Medications on Admission:   Current Outpatient Prescriptions:  .  acetaminophen (TYLENOL) 500 MG tablet, Take 1,000 mg by mouth 2 (two) times daily as needed for pain. , Disp: , Rfl:  .  Albuterol Sulfate (VENTOLIN HFA IN), Inhale 2 puffs into the lungs 4 (four) times daily as needed., Disp: , Rfl:  .  ANORO ELLIPTA 62.5-25 MCG/INH AEPB, Inhale 1 puff into the lungs daily., Disp: , Rfl:  .  bisoprolol (ZEBETA) 5 MG tablet, Take 1 tablet (5 mg total) by mouth daily., Disp: 30 tablet, Rfl: 0 .  buPROPion (WELLBUTRIN SR) 150 MG 12 hr tablet, Take 150 mg by mouth daily., Disp: , Rfl:  .  calcium-vitamin D (OSCAL WITH D) 500-200 MG-UNIT per tablet, Take 1 tablet by mouth 2 (two) times daily., Disp: , Rfl:  .  clonazePAM (KLONOPIN) 0.5 MG tablet, Take 1 tablet (0.5 mg total) by mouth 2 (two) times daily as needed for anxiety. (Patient taking differently: Take 0.5 mg by mouth daily as needed for anxiety. ), Disp: 10 tablet, Rfl: 0 .  clopidogrel (PLAVIX) 75 MG tablet, Take 1 tablet (75 mg total) by mouth daily., Disp: 30 tablet, Rfl: 0 .  feeding supplement, ENSURE ENLIVE, (ENSURE ENLIVE) LIQD, Take 237 mLs by mouth 2 (two) times daily between meals., Disp: 237 mL, Rfl: 12 .  fluticasone (FLOVENT HFA) 110 MCG/ACT inhaler, Inhale 2 puffs into the lungs 2 (two) times daily., Disp: 1 Inhaler, Rfl: 0 .  lovastatin (MEVACOR) 40 MG tablet, Take 40 mg by mouth every morning., Disp: , Rfl:  .  nicotine  (NICODERM CQ - DOSED IN MG/24 HOURS) 21 mg/24hr patch, Place 21 mg onto the skin daily., Disp: , Rfl:  .  PARoxetine (PAXIL) 20 MG tablet, Take 20 mg by mouth daily., Disp: , Rfl:  .  VENTOLIN HFA 108 (90 Base) MCG/ACT inhaler, , Disp: , Rfl:  .  Vitamin D, Ergocalciferol, (DRISDOL) 50000 units CAPS capsule, Take 50,000 Units by mouth every 7 (seven) days., Disp: , Rfl:   Past Medical History: Past Medical History:  Diagnosis Date  . Anxiety   . COPD (chronic obstructive pulmonary disease) ([place])   . Depression   . Hyperlipidemia   . Hypertension     Tobacco Use: History  Smoking Status  . Former Smoker  . Packs/day: 1.50  . Years: 40.00  . Types: Cigarettes  . Quit date: 03/18/2017  Smokeless Tobacco  . Never Used    Labs: Recent Review Flowsheet Data    Labs for ITP Cardiac and Pulmonary Rehab Latest Ref Rng & Units 05/04/2017 05/05/2017 05/07/2017 05/09/2017 05/09/2017   Cholestrol 0 - 200 mg/dL 118 - - - -   LDLCALC 0 - 99 mg/dL 72 - - - -   HDL >40 mg/dL 31(L) - - - -   Trlycerides <150 mg/dL 76 - - - -   Hemoglobin A1c 4.8 - 5.6 % 5.8(H) - - - -   PHART 7.350 -  7.450 - 7.249(L) 7.311(L) 7.423 -   PCO2ART 32.0 - 48.0 mmHg - 53.9(H) 59.3(H) 56.6(H) -   HCO3 20.0 - 28.0 mmol/L 26.4 22.8 29.0(H) 36.9(H) 39.0(H)   TCO2 0 - 100 mmol/L 28 - - 39 41   ACIDBASEDEF 0.0 - 2.0 mmol/L - 3.4(H) - - -   O2SAT % 84.0 98.8 98.2 90.0 65.0      Capillary Blood Glucose: Lab Results  Component Value Date   GLUCAP 148 (H) 05/06/2017     Pulmonary Assessment Scores:   Pulmonary Function Assessment:     Pulmonary Function Assessment - 07/01/17 1055      Breath   Bilateral Breath Sounds Clear;Decreased   Shortness of Breath Yes;Limiting activity      Exercise Target Goals: Date: 07/12/17  Exercise Program Goal: Individual exercise prescription set with THRR, safety & activity barriers. Participant demonstrates ability to understand and report RPE using BORG scale, to  self-measure pulse accurately, and to acknowledge the importance of the exercise prescription.  Exercise Prescription Goal: Starting with aerobic activity 30 plus minutes a day, 3 days per week for initial exercise prescription. Provide home exercise prescription and guidelines that participant acknowledges understanding prior to discharge.  Activity Barriers & Risk Stratification:     Activity Barriers & Cardiac Risk Stratification - 07/01/17 1034      Activity Barriers & Cardiac Risk Stratification   Activity Barriers Deconditioning;Muscular Weakness;Shortness of Breath      6 Minute Walk:     6 Minute Walk    Row Name 07/12/17 0735         6 Minute Walk   Phase Initial     Distance 1200 feet     Walk Time 6 minutes     # of Rest Breaks 0     MPH 2.27     METS 2.76     RPE 11     Perceived Dyspnea  1     Symptoms No     Resting HR 77 bpm     Resting BP 149/80     Resting Oxygen Saturation  86 %  2 liters/88% on 4 liters     Exercise Oxygen Saturation  during 6 min walk 85 %     Max Ex. HR 100 bpm     Max Ex. BP 163/75     2 Minute Post BP 174/82       Interval HR   1 Minute HR 77     2 Minute HR 78     3 Minute HR 95     4 Minute HR 100     5 Minute HR 95     6 Minute HR 93     2 Minute Post HR 74     Interval Heart Rate? Yes       Interval Oxygen   Interval Oxygen? Yes     Baseline Oxygen Saturation % 86 %     1 Minute Oxygen Saturation % 94 %     1 Minute Liters of Oxygen 4 L     2 Minute Oxygen Saturation % 91 %     2 Minute Liters of Oxygen 4 L     3 Minute Oxygen Saturation % 87 %     3 Minute Liters of Oxygen 4 L     4 Minute Oxygen Saturation % 85 %     4 Minute Liters of Oxygen 6 L     5 Minute Oxygen Saturation %  86 %     5 Minute Liters of Oxygen 6 L     6 Minute Oxygen Saturation % 88 %     6 Minute Liters of Oxygen 6 L     2 Minute Post Oxygen Saturation % 97 %     2 Minute Post Liters of Oxygen 6 L        Oxygen Initial  Assessment:     Oxygen Initial Assessment - 07/01/17 1023      Home Oxygen   Home Oxygen Device E-Tanks   Sleep Oxygen Prescription Continuous   Liters per minute 3   Home Exercise Oxygen Prescription Pulsed   Liters per minute 4   Home at Rest Exercise Oxygen Prescription Continuous   Liters per minute 3   Compliance with Home Oxygen Use Yes     Intervention   Short Term Goals To learn and exhibit compliance with exercise, home and travel O2 prescription;To learn and understand importance of monitoring SPO2 with pulse oximeter and demonstrate accurate use of the pulse oximeter.;To learn and understand importance of maintaining oxygen saturations>88%;To learn and demonstrate proper pursed lip breathing techniques or other breathing techniques.;To learn and demonstrate proper use of respiratory medications   Long  Term Goals Exhibits compliance with exercise, home and travel O2 prescription;Verbalizes importance of monitoring SPO2 with pulse oximeter and return demonstration;Maintenance of O2 saturations>88%;Exhibits proper breathing techniques, such as pursed lip breathing or other method taught during program session;Compliance with respiratory medication;Demonstrates proper use of MDI's      Oxygen Re-Evaluation:   Oxygen Discharge (Final Oxygen Re-Evaluation):   Initial Exercise Prescription:     Initial Exercise Prescription - 07/12/17 0700      Date of Initial Exercise RX and Referring Provider   Date 07/12/17   Referring Provider Dr. Lake Bells     Oxygen   Oxygen Continuous   Liters 6     Recumbant Bike   Level 2   Minutes 17     NuStep   Level 2   Minutes 17   METs 1.3     Track   Laps 5   Minutes 17     Prescription Details   Frequency (times per week) 2   Duration Progress to 45 minutes of aerobic exercise without signs/symptoms of physical distress     Intensity   THRR 40-80% of Max Heartrate 59-118   Ratings of Perceived Exertion 11-13    Perceived Dyspnea 0-4     Progression   Progression Continue progressive overload as per policy without signs/symptoms or physical distress.     Resistance Training   Training Prescription Yes   Weight orange bands   Reps 10-15      Perform Capillary Blood Glucose checks as needed.  Exercise Prescription Changes:   Exercise Comments:   Exercise Goals and Review:     Exercise Goals    Row Name 07/01/17 1035             Exercise Goals   Increase Physical Activity Yes       Intervention Provide advice, education, support and counseling about physical activity/exercise needs.;Develop an individualized exercise prescription for aerobic and resistive training based on initial evaluation findings, risk stratification, comorbidities and participant's personal goals.       Expected Outcomes Achievement of increased cardiorespiratory fitness and enhanced flexibility, muscular endurance and strength shown through measurements of functional capacity and personal statement of participant.       Increase Strength and Stamina Yes  Intervention Provide advice, education, support and counseling about physical activity/exercise needs.;Develop an individualized exercise prescription for aerobic and resistive training based on initial evaluation findings, risk stratification, comorbidities and participant's personal goals.       Expected Outcomes Achievement of increased cardiorespiratory fitness and enhanced flexibility, muscular endurance and strength shown through measurements of functional capacity and personal statement of participant.       Able to understand and use rate of perceived exertion (RPE) scale Yes       Intervention Provide education and explanation on how to use RPE scale       Expected Outcomes Short Term: Able to use RPE daily in rehab to express subjective intensity level;Long Term:  Able to use RPE to guide intensity level when exercising independently       Able to  understand and use Dyspnea scale Yes       Intervention Provide education and explanation on how to use Dyspnea scale       Expected Outcomes Short Term: Able to use Dyspnea scale daily in rehab to express subjective sense of shortness of breath during exertion;Long Term: Able to use Dyspnea scale to guide intensity level when exercising independently       Knowledge and understanding of Target Heart Rate Range (THRR) Yes       Intervention Provide education and explanation of THRR including how the numbers were predicted and where they are located for reference       Expected Outcomes Short Term: Able to state/look up THRR;Long Term: Able to use THRR to govern intensity when exercising independently;Short Term: Able to use daily as guideline for intensity in rehab       Understanding of Exercise Prescription Yes       Intervention Provide education, explanation, and written materials on patient's individual exercise prescription       Expected Outcomes Short Term: Able to explain program exercise prescription;Long Term: Able to explain home exercise prescription to exercise independently          Exercise Goals Re-Evaluation :   Discharge Exercise Prescription (Final Exercise Prescription Changes):   Nutrition:  Target Goals: Understanding of nutrition guidelines, daily intake of sodium <1524m, cholesterol <2021m calories 30% from fat and 7% or less from saturated fats, daily to have 5 or more servings of fruits and vegetables.  Biometrics:     Pre Biometrics - 07/01/17 1134      Pre Biometrics   Grip Strength 15 kg       Nutrition Therapy Plan and Nutrition Goals:     Nutrition Therapy & Goals - 07/04/17 1212      Nutrition Therapy   Diet Therapeutic Lifestyle Changes     Personal Nutrition Goals   Nutrition Goal The pt will recognize symptoms that can interfere with adequate oral intake, such as shortness of breath, N/V, early satiety, fatigue, ability to secure and  prepare food, taste and smell changes, chewing/swallowing difficulties, and/ or pain when eating.     Intervention Plan   Intervention Prescribe, educate and counsel regarding individualized specific dietary modifications aiming towards targeted core components such as weight, hypertension, lipid management, diabetes, heart failure and other comorbidities.   Expected Outcomes Short Term Goal: Understand basic principles of dietary content, such as calories, fat, sodium, cholesterol and nutrients.;Long Term Goal: Adherence to prescribed nutrition plan.      Nutrition Discharge: Rate Your Plate Scores:     Nutrition Assessments - 07/04/17 1212      Rate  Your Plate Scores   Pre Score --  returned form; not completed      Nutrition Goals Re-Evaluation:   Nutrition Goals Discharge (Final Nutrition Goals Re-Evaluation):   Psychosocial: Target Goals: Acknowledge presence or absence of significant depression and/or stress, maximize coping skills, provide positive support system. Participant is able to verbalize types and ability to use techniques and skills needed for reducing stress and depression.  Initial Review & Psychosocial Screening:     Initial Psych Review & Screening - 07/01/17 1059      Initial Review   Current issues with Current Depression;Current Anxiety/Panic     Family Dynamics   Good Support System? No   Strains Intra-family strains;Illness and family care strain     Barriers   Psychosocial barriers to participate in program Psychosocial barriers identified (see note)     Screening Interventions   Interventions Encouraged to exercise;Other (comment)   Comments currently sees counselor weekly and more often as needed related to caregiver strain. also member of caregiver support group at [facility]      Quality of Life Scores:   PHQ-9: Recent Review Flowsheet Data    Depression screen [facility] 2/9 07/04/2017 07/01/2017 05/19/2017 05/16/2017   Decreased Interest _0 Down, Depressed, Hopeless _1 PHQ - 2 Score _2 Altered sleeping - _3 Tired, decreased energy - _4 Change in appetite - _5 Feeling bad or failure about yourself  - _6 Trouble concentrating - _7 Moving slowly or fidgety/restless - 0 1 1   Suicidal thoughts - 0 0 0   PHQ-9 Score - _8 Difficult doing work/chores - Somewhat difficult Very difficult Very difficult     Interpretation of Total Score  Total Score Depression Severity:  1-4 = Minimal depression, 5-9 = Mild depression, 10-14 = Moderate depression, 15-19 = Moderately severe depression, 20-27 = Severe depression   Psychosocial Evaluation and Intervention:     Psychosocial Evaluation - 07/01/17 1102      Psychosocial Evaluation & Interventions   Interventions Stress management education;Encouraged to exercise with the program and follow exercise prescription   Expected Outcomes psychosocial issues will not become a barrier to participation to pulmonary rehab   Continue Psychosocial Services  Follow up required by staff      Psychosocial Re-Evaluation:   Psychosocial Discharge (Final Psychosocial Re-Evaluation):   Education: Education Goals: Education classes will be provided on a weekly basis, covering required topics. Participant will state understanding/return demonstration of topics presented.  Learning Barriers/Preferences:     Learning Barriers/Preferences - 07/01/17 1055      Learning Barriers/Preferences   Learning Barriers None   Learning Preferences Computer/Internet;Individual Instruction;Verbal Instruction;Written Material      Education Topics: Risk Factor Reduction:  -Group instruction that is supported by a PowerPoint presentation. Instructor discusses the definition of a risk factor, different risk factors for pulmonary disease, and how the heart and lungs work together.     Nutrition for Pulmonary Patient:  -Group instruction provided by  PowerPoint slides, verbal discussion, and written materials to support subject matter. The instructor gives an explanation and review of healthy diet recommendations, which includes a discussion on weight management, recommendations for fruit and vegetable consumption, as well as protein, fluid, caffeine, fiber, sodium, sugar, and alcohol. Tips for eating when patients are short  of breath are discussed.   Pursed Lip Breathing:  -Group instruction that is supported by demonstration and informational handouts. Instructor discusses the benefits of pursed lip and diaphragmatic breathing and detailed demonstration on how to preform both.     Oxygen Safety:  -Group instruction provided by PowerPoint, verbal discussion, and written material to support subject matter. There is an overview of "What is Oxygen" and "Why do we need it".  Instructor also reviews how to create a safe environment for oxygen use, the importance of using oxygen as prescribed, and the risks of noncompliance. There is a brief discussion on traveling with oxygen and resources the patient may utilize.   Oxygen Equipment:  -Group instruction provided by [facility] Staff utilizing handouts, written materials, and equipment demonstrations.   Signs and Symptoms:  -Group instruction provided by written material and verbal discussion to support subject matter. Warning signs and symptoms of infection, stroke, and heart attack are reviewed and when to call the physician/911 reinforced. Tips for preventing the spread of infection discussed.   Advanced Directives:  -Group instruction provided by verbal instruction and written material to support subject matter. Instructor reviews Advanced Directive laws and proper instruction for filling out document.   Pulmonary Video:  -Group video education that reviews the importance of medication and oxygen compliance, exercise, good nutrition, pulmonary hygiene, and pursed lip and diaphragmatic  breathing for the pulmonary patient.   Exercise for the Pulmonary Patient:  -Group instruction that is supported by a PowerPoint presentation. Instructor discusses benefits of exercise, core components of exercise, frequency, duration, and intensity of an exercise routine, importance of utilizing pulse oximetry during exercise, safety while exercising, and options of places to exercise outside of rehab.     Pulmonary Medications:  -Verbally interactive group education provided by instructor with focus on inhaled medications and proper administration.   Anatomy and Physiology of the Respiratory System and Intimacy:  -Group instruction provided by PowerPoint, verbal discussion, and written material to support subject matter. Instructor reviews respiratory cycle and anatomical components of the respiratory system and their functions. Instructor also reviews differences in obstructive and restrictive respiratory diseases with examples of each. Intimacy, Sex, and Sexuality differences are reviewed with a discussion on how relationships can change when diagnosed with pulmonary disease. Common sexual concerns are reviewed.   MD DAY -A group question and answer session with a medical doctor that allows participants to ask questions that relate to their pulmonary disease state.   OTHER EDUCATION -Group or individual verbal, written, or video instructions that support the educational goals of the pulmonary rehab program.   Knowledge Questionnaire Score:   Core Components/Risk Factors/Patient Goals at Admission:     Personal Goals and Risk Factors at Admission - 07/01/17 1056      Core Components/Risk Factors/Patient Goals on Admission   Improve shortness of breath with ADL's Yes   Intervention Provide education, individualized exercise plan and daily activity instruction to help decrease symptoms of SOB with activities of daily living.   Expected Outcomes Short Term: Achieves a reduction of  symptoms when performing activities of daily living.   Develop more efficient breathing techniques such as purse lipped breathing and diaphragmatic breathing; and practicing self-pacing with activity Yes   Intervention Provide education, demonstration and support about specific breathing techniuqes utilized for more efficient breathing. Include techniques such as pursed lipped breathing, diaphragmatic breathing and self-pacing activity.   Expected Outcomes Short Term: Participant will be able to demonstrate and use breathing techniques as needed  throughout daily activities.   Increase knowledge of respiratory medications and ability to use respiratory devices properly  Yes   Intervention Provide education and demonstration as needed of appropriate use of medications, inhalers, and oxygen therapy.   Expected Outcomes Short Term: Achieves understanding of medications use. Understands that oxygen is a medication prescribed by physician. Demonstrates appropriate use of inhaler and oxygen therapy.   Stress Yes   Intervention Offer individual and/or small group education and counseling on adjustment to heart disease, stress management and health-related lifestyle change. Teach and support self-help strategies.;Refer participants experiencing significant psychosocial distress to appropriate mental health specialists for further evaluation and treatment. When possible, include family members and significant others in education/counseling sessions.   Expected Outcomes Short Term: Participant demonstrates changes in health-related behavior, relaxation and other stress management skills, ability to obtain effective social support, and compliance with psychotropic medications if prescribed.;Long Term: Emotional wellbeing is indicated by absence of clinically significant psychosocial distress or social isolation.      Core Components/Risk Factors/Patient Goals Review:    Core Components/Risk Factors/Patient Goals  at Discharge (Final Review):    ITP Comments:   Comments:

## 2017-07-14 ENCOUNTER — Encounter (HOSPITAL_COMMUNITY)
Admission: RE | Admit: 2017-07-14 | Discharge: 2017-07-14 | Disposition: A | Discharge status: Home / Self Care | Payer: Medicare Other | Source: Ambulatory Visit | Attending: Internal Medicine | Admitting: Internal Medicine

## 2017-07-14 VITALS — Wt 117.3 lb

## 2017-07-14 DIAGNOSIS — I272 Pulmonary hypertension, unspecified: Secondary | ICD-10-CM | POA: Diagnosis not present

## 2017-07-14 NOTE — Progress Notes (Signed)
Daily Session Note  Patient Details  Name: MORISSA OBEIRNE MRN: [id number] Date of Birth: 24-Nov-1945 Referring Provider:     Pulmonary Rehab Walk Test from 07/07/2017 in [place]  Referring Provider  Dr. Lake Bells      Encounter Date: 07/14/2017  Check In:     Session Check In - 07/14/17 1057      Check-In   Location [facility]   Staff Present Trish Fountain, RN, BSN;Molly diVincenzo, MS, ACSM RCEP, Exercise Physiologist;Ammaar Encina Ysidro Evert, RN   Supervising physician immediately available to respond to emergencies [facility] immediately available   Physician(s) Dr. Wendee Beavers   Medication changes reported     No   Fall or balance concerns reported    No   Tobacco Cessation No Change   Warm-up and Cool-down Performed as group-led instruction   Resistance Training Performed Yes   VAD Patient? No     Pain Assessment   Currently in Pain? No/denies   Multiple Pain Sites No      Capillary Blood Glucose: No results found for this or any previous visit (from the past 24 hour(s)).      Exercise Prescription Changes - 07/14/17 1200      Response to Exercise   Blood Pressure (Admit) 102/60   Blood Pressure (Exercise) 152/64   Blood Pressure (Exit) 108/60   Heart Rate (Admit) 72 bpm   Heart Rate (Exercise) 107 bpm   Heart Rate (Exit) 74 bpm   Oxygen Saturation (Admit) 94 %   Oxygen Saturation (Exercise) 85 %  O2 increased to 6L sat improved 88%   Oxygen Saturation (Exit) 95 %   Rating of Perceived Exertion (Exercise) 15   Perceived Dyspnea (Exercise) 1   Duration Progress to 45 minutes of aerobic exercise without signs/symptoms of physical distress   Intensity Other (comment)  40-80% of HRR     Progression   Progression Continue to progress workloads to maintain intensity without signs/symptoms of physical distress.     Resistance Training   Training Prescription Yes   Weight orange bands   Reps 10-15   Time 10 Minutes      Oxygen   Oxygen Continuous   Liters 6     Recumbant Bike   Level 2   Minutes 17     NuStep   Level 2   Minutes 17   METs 2.2      History  Smoking Status  . Former Smoker  . Packs/day: 1.50  . Years: 40.00  . Types: Cigarettes  . Quit date: 03/18/2017  Smokeless Tobacco  . Never Used    Goals Met:  Exercise tolerated well No report of cardiac concerns or symptoms Strength training completed today  Goals Unmet:  Not Applicable  Comments: Service time is from 1030 to 1210    Dr. Rush Farmer is Medical Director for Pulmonary Rehab at [facility].

## 2017-07-15 DIAGNOSIS — Z23 Encounter for immunization: Secondary | ICD-10-CM | POA: Diagnosis not present

## 2017-07-19 ENCOUNTER — Encounter (HOSPITAL_COMMUNITY)
Admission: RE | Admit: 2017-07-19 | Discharge: 2017-07-19 | Disposition: A | Discharge status: Home / Self Care | Payer: Medicare Other | Source: Ambulatory Visit | Attending: Internal Medicine | Admitting: Internal Medicine

## 2017-07-19 VITALS — Wt 119.5 lb

## 2017-07-19 DIAGNOSIS — F329 Major depressive disorder, single episode, unspecified: Secondary | ICD-10-CM | POA: Diagnosis not present

## 2017-07-19 DIAGNOSIS — F4323 Adjustment disorder with mixed anxiety and depressed mood: Secondary | ICD-10-CM | POA: Diagnosis not present

## 2017-07-19 DIAGNOSIS — I272 Pulmonary hypertension, unspecified: Secondary | ICD-10-CM | POA: Diagnosis not present

## 2017-07-19 NOTE — Progress Notes (Signed)
Daily Session Note  Patient Details  Name: Kayla Mack MRN: [id number] Date of Birth: [birth year] Referring Provider:     Pulmonary Rehab Walk Test from 07/07/2017 in [place]  Referring Provider  Dr. Lake Bells      Encounter Date: 07/19/2017  Check In:     Session Check In - 07/19/17 1229      Check-In   Location [facility]   Staff Present Su Hilt, MS, ACSM RCEP, Exercise Physiologist;Lisa Ysidro Evert, RN;Other   Supervising physician immediately available to respond to emergencies [facility] immediately available   Physician(s) Dr. Wendee Beavers   Medication changes reported     No   Fall or balance concerns reported    No   Tobacco Cessation No Change   Warm-up and Cool-down Performed as group-led instruction   Resistance Training Performed Yes   VAD Patient? No     Pain Assessment   Currently in Pain? No/denies   Multiple Pain Sites No      Capillary Blood Glucose: No results found for this or any previous visit (from the past 24 hour(s)).      Exercise Prescription Changes - 07/19/17 1200      Response to Exercise   Blood Pressure (Admit) 118/62   Blood Pressure (Exercise) 140/60   Blood Pressure (Exit) 106/66   Heart Rate (Admit) 65 bpm   Heart Rate (Exercise) 84 bpm   Heart Rate (Exit) 70 bpm   Oxygen Saturation (Admit) 98 %   Oxygen Saturation (Exercise) 88 %   Oxygen Saturation (Exit) 97 %   Rating of Perceived Exertion (Exercise) 15   Perceived Dyspnea (Exercise) 3   Duration Progress to 45 minutes of aerobic exercise without signs/symptoms of physical distress   Intensity Other (comment)  40-80% of HRR     Progression   Progression Continue to progress workloads to maintain intensity without signs/symptoms of physical distress.     Resistance Training   Training Prescription Yes   Weight orange bands   Reps 10-15   Time 10 Minutes     Oxygen   Oxygen Continuous   Liters 6     Recumbant Bike   Level 2   Minutes 17     NuStep   Level 2   Minutes 17   METs 2.2     Track   Laps 11   Minutes 17      History  Smoking Status  . Former Smoker  . Packs/day: 1.50  . Years: 40.00  . Types: Cigarettes  . Quit date: 03/18/2017  Smokeless Tobacco  . Never Used    Goals Met:  Exercise tolerated well No report of cardiac concerns or symptoms Strength training completed today  Goals Unmet:  Not Applicable  Comments: Service time is from 10:30a to 12:10p    Dr. Rush Farmer is Medical Director for Pulmonary Rehab at [facility].

## 2017-07-21 ENCOUNTER — Ambulatory Visit (INDEPENDENT_AMBULATORY_CARE_PROVIDER_SITE_OTHER): Payer: Medicare Other | Admitting: Emergency Medicine

## 2017-07-21 ENCOUNTER — Encounter (HOSPITAL_COMMUNITY)
Admission: RE | Admit: 2017-07-21 | Discharge: 2017-07-21 | Disposition: A | Discharge status: Home / Self Care | Payer: Medicare Other | Source: Ambulatory Visit | Attending: Internal Medicine | Admitting: Internal Medicine

## 2017-07-21 ENCOUNTER — Encounter: Payer: Self-pay | Admitting: Emergency Medicine

## 2017-07-21 VITALS — Wt 119.0 lb

## 2017-07-21 DIAGNOSIS — J441 Chronic obstructive pulmonary disease with (acute) exacerbation: Secondary | ICD-10-CM | POA: Diagnosis not present

## 2017-07-21 DIAGNOSIS — I272 Pulmonary hypertension, unspecified: Secondary | ICD-10-CM

## 2017-07-21 DIAGNOSIS — J449 Chronic obstructive pulmonary disease, unspecified: Secondary | ICD-10-CM

## 2017-07-21 LAB — PULMONARY FUNCTION TEST
DL/VA % pred: 29 %
DL/VA: 1.31 ml/min/mmHg/L
DLCO cor % pred: 24 %
DLCO cor: 4.97 ml/min/mmHg
DLCO unc % pred: 24 %
DLCO unc: 5 ml/min/mmHg
FEF 25-75 Post: 0.32 L/sec
FEF 25-75 Pre: 0.35 L/sec
FEF2575-%Change-Post: -9 %
FEF2575-%Pred-Post: 19 %
FEF2575-%Pred-Pre: 22 %
FEV1-%Change-Post: -3 %
FEV1-%Pred-Post: 46 %
FEV1-%Pred-Pre: 48 %
FEV1-Post: 0.89 L
FEV1-Pre: 0.92 L
FEV1FVC-%Change-Post: 0 %
FEV1FVC-%Pred-Pre: 55 %
FEV6-%Change-Post: -2 %
FEV6-%Pred-Post: 82 %
FEV6-%Pred-Pre: 84 %
FEV6-Post: 1.99 L
FEV6-Pre: 2.03 L
FEV6FVC-%Change-Post: 1 %
FEV6FVC-%Pred-Post: 98 %
FEV6FVC-%Pred-Pre: 97 %
FVC-%Change-Post: -3 %
FVC-%Pred-Post: 83 %
FVC-%Pred-Pre: 86 %
FVC-Post: 2.11 L
FVC-Pre: 2.19 L
Post FEV1/FVC ratio: 42 %
Post FEV6/FVC ratio: 94 %
Pre FEV1/FVC ratio: 42 %
Pre FEV6/FVC Ratio: 93 %
RV % pred: 140 %
RV: 2.94 L
TLC % pred: 115 %
TLC: 5.32 L

## 2017-07-21 NOTE — Patient Instructions (Signed)
We will work on getting a portable oxygen concentrator through [employer] Please continue Anoro once a day Stop Flovent for now. Use albuterol 2 puffs as needed for shortness of breath Continue to work with Pulmonary Rehab Flu Shot and Prevnar 13 up to date Follow with Dr Lamonte Sakai in 6 months or sooner if you have any problems

## 2017-07-21 NOTE — Progress Notes (Signed)
Daily Session Note  Patient Details  Name: GLENISHA GUNDRY MRN: [id number] Date of Birth: May 24, 1946 Referring Provider:     Pulmonary Rehab Walk Test from 07/07/2017 in [place]  Referring Provider  Dr. Lake Bells      Encounter Date: 07/21/2017  Check In:     Session Check In - 07/21/17 1050      Check-In   [facility]   Staff Present Rodney Langton, RN;Molly diVincenzo, MS, ACSM RCEP, Exercise Physiologist   Physician(s) Dr. Zigmund Daniel   Medication changes reported     No   Fall or balance concerns reported    No   Tobacco Cessation No Change   Warm-up and Cool-down Performed as group-led instruction   Resistance Training Performed Yes   VAD Patient? No     Pain Assessment   Currently in Pain? No/denies   Multiple Pain Sites No      Capillary Blood Glucose: No results found for this or any previous visit (from the past 24 hour(s)).      Exercise Prescription Changes - 07/21/17 1200      Response to Exercise   Blood Pressure (Admit) 124/70   Blood Pressure (Exercise) 132/72   Blood Pressure (Exit) 112/60   Heart Rate (Admit) 75 bpm   Heart Rate (Exercise) 122 bpm   Heart Rate (Exit) 74 bpm   Oxygen Saturation (Admit) 94 %   Oxygen Saturation (Exercise) 87 %   Oxygen Saturation (Exit) 95 %   Rating of Perceived Exertion (Exercise) 15   Perceived Dyspnea (Exercise) 3   Duration Progress to 45 minutes of aerobic exercise without signs/symptoms of physical distress   Intensity THRR unchanged     Progression   Progression Continue to progress workloads to maintain intensity without signs/symptoms of physical distress.     Resistance Training   Training Prescription Yes   Weight orange bands   Reps 10-15   Time 10 Minutes     Oxygen   Oxygen Continuous   Liters 6     Recumbant Bike   Level 2   Minutes 17     NuStep   Level 2   Minutes 17   METs 2.4      History  Smoking Status  . Former  Smoker  . Packs/day: 1.50  . Years: 40.00  . Types: Cigarettes  . Quit date: 03/18/2017  Smokeless Tobacco  . Never Used    Goals Met:  Exercise tolerated well No report of cardiac concerns or symptoms Strength training completed today  Goals Unmet:  Not Applicable  Comments: Service time is from 1030 to 1225    Dr. Rush Farmer is Medical Director for Pulmonary Rehab at [facility].

## 2017-07-21 NOTE — Progress Notes (Signed)
Subjective:    Patient ID: Kayla Mack, female    DOB: [birth year], 71 y.o.   MRN: [id number]  HPI 71 year old former smoker (5 pack years) with a history of hypertension, hyperlipidemia, depression. She has COPD, hospitalized in July 2018 for an acute exacerbation in the setting of community-acquired pneumonia. This was, complicated by hypercapnic respiratory failure for which she was treated with BiPAP. An echocardiogram showed evidence for her hypertension. This prompted a left and right heart catheterization on 05/09/17 that showed nonobstructive coronary artery disease and moderate PAH. She was discharged on Anoro plus Flovent, oxygen at 3-6 L/m. She has been doing pulmonary rehabilitation. She believes that she is regaining some of her functional capacity. She does not use albuterol currently. She is interested in a POC, has [facility]  Spirometry 06/06/17 > severe obstruction Pulmonary function testing performed today 10/4 were reviewed by me. These showSevere obstruction with an FEV1 of 0.92 L (48% predicted), no bronchodilator response, hyperinflated volumes, severely decreased diffusion capacity. R heart cath 05/09/17: PAP 57/22 (38), PAOP 12   Review of Systems Progressive exertional SOB over several months Nocturnal awakenings with SOB Minimal cough, no wheeze.    Past Medical History:  Diagnosis Date  . Anxiety   . COPD (chronic obstructive pulmonary disease) ([place])   . Depression   . Hyperlipidemia   . Hypertension      Family History  Problem Relation Age of Onset  . Heart disease Mother   . Heart failure Mother   . Heart disease Father        cabg  . Heart disease Sister   . Diabetes Sister      Social History   Social History  . Marital status: Single    Spouse name: N/A  . Number of children: N/A  . Years of education: N/A   Occupational History  . Not on file.   Social History Main Topics  . Smoking status: Former Smoker    Packs/day: 1.50    Years:  40.00    Types: Cigarettes    Quit date: 03/18/2017  . Smokeless tobacco: Never Used  . Alcohol use Yes     Comment: liquor   . Drug use: No  . Sexual activity: Not Currently   Other Topics Concern  . Not on file   Social History Narrative  . No narrative on file  worked as a Catering manager  Allergies  Allergen Reactions  . Aleve [Naproxen Sodium] Hives  . Aspirin Swelling    Facial swelling  . Penicillins Hives and Swelling    Has patient had a PCN reaction causing immediate rash, facial/tongue/throat swelling, SOB or lightheadedness with hypotension: Yes Has patient had a PCN reaction causing severe rash involving mucus membranes or skin necrosis: Yes Has patient had a PCN reaction that required hospitalization: Unk Has patient had a PCN reaction occurring within the last 10 years: No If all of the above answers are "NO", then may proceed with Cephalosporin use.      Outpatient Medications Prior to Visit  Medication Sig Dispense Refill  . acetaminophen (TYLENOL) 500 MG tablet Take 1,000 mg by mouth 2 (two) times daily as needed for pain.     . Albuterol Sulfate (VENTOLIN HFA IN) Inhale 2 puffs into the lungs 4 (four) times daily as needed.    [name] ELLIPTA 62.5-25 MCG/INH AEPB Inhale 1 puff into the lungs daily.    . bisoprolol (ZEBETA) 5 MG tablet Take 1 tablet (5 mg  total) by mouth daily. 30 tablet 0  . buPROPion (WELLBUTRIN SR) 150 MG 12 hr tablet Take 150 mg by mouth daily.    . calcium-vitamin D (OSCAL WITH D) 500-200 MG-UNIT per tablet Take 1 tablet by mouth 2 (two) times daily.    . clonazePAM (KLONOPIN) 0.5 MG tablet Take 1 tablet (0.5 mg total) by mouth 2 (two) times daily as needed for anxiety. (Patient taking differently: Take 0.5 mg by mouth daily as needed for anxiety. ) 10 tablet 0  . clopidogrel (PLAVIX) 75 MG tablet Take 1 tablet (75 mg total) by mouth daily. 30 tablet 0  . feeding supplement, ENSURE ENLIVE, (ENSURE ENLIVE) LIQD Take 237 mLs by mouth 2  (two) times daily between meals. 237 mL 12  . fluticasone (FLOVENT HFA) 110 MCG/ACT inhaler Inhale 2 puffs into the lungs 2 (two) times daily. 1 Inhaler 0  . lovastatin (MEVACOR) 40 MG tablet Take 40 mg by mouth every morning.    . nicotine (NICODERM CQ - DOSED IN MG/24 HOURS) 21 mg/24hr patch Place 21 mg onto the skin daily.    [name] PARoxetine (PAXIL) 20 MG tablet Take 20 mg by mouth daily.    . VENTOLIN HFA 108 (90 Base) MCG/ACT inhaler     . Vitamin D, Ergocalciferol, (DRISDOL) 50000 units CAPS capsule Take 50,000 Units by mouth every 7 (seven) days.     No facility-administered medications prior to visit.          Objective:   Physical Exam Vitals:   07/21/17 1619  BP: 136/68  Pulse: 78  SpO2: 90%  Weight: 118 lb (53.5 kg)  Height: _0  (1.549 m)   Gen: Pleasant, thin ill appearing, in no distress,  normal affect  ENT: No lesions,  mouth clear,  oropharynx clear, no postnasal drip  Neck: No JVD, no stridor  Lungs: No use of accessory muscles, very distant with some end exp wheezes  Cardiovascular: RRR, heart sounds normal, no murmur or gallops, no peripheral edema  Musculoskeletal: No deformities, no cyanosis or clubbing  Neuro: alert, non focal  Skin: Warm, no lesions or rashes       Assessment & Plan:  COPD (chronic obstructive pulmonary disease) (HCC) Continue current regimen with Anoro, albuterol as needed. Try stopping Flovent Flu shot and prevnar up to date Maintain adequate oxygenation at rest and with exertion. She wants a POC, will work on this.   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 Continue to treat her COPD aggressively. Continue to maintain adequate oxygenation at all times. No evidence at this time to support obstructive sleep apnea or decompensated left heart failure.   Baltazar Apo, MD, PhD 07/21/2017, 5:06 PM [facility] [phone] or if no answer  [phone]

## 2017-07-21 NOTE — Progress Notes (Signed)
PFT completed today 07/21/17.

## 2017-07-21 NOTE — Assessment & Plan Note (Signed)
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 Continue to treat her COPD aggressively. Continue to maintain adequate oxygenation at all times. No evidence at this time to support obstructive sleep apnea or decompensated left heart failure.

## 2017-07-21 NOTE — Assessment & Plan Note (Addendum)
Continue current regimen with Anoro, albuterol as needed. Try stopping Flovent Flu shot and prevnar up to date Maintain adequate oxygenation at rest and with exertion. She wants a POC, will work on this.

## 2017-07-25 DIAGNOSIS — Z1231 Encounter for screening mammogram for malignant neoplasm of breast: Secondary | ICD-10-CM | POA: Diagnosis not present

## 2017-07-25 DIAGNOSIS — Z803 Family history of malignant neoplasm of breast: Secondary | ICD-10-CM | POA: Diagnosis not present

## 2017-07-26 ENCOUNTER — Encounter (HOSPITAL_COMMUNITY)
Admission: RE | Admit: 2017-07-26 | Discharge: 2017-07-26 | Disposition: A | Discharge status: Home / Self Care | Payer: Medicare Other | Source: Ambulatory Visit | Attending: Internal Medicine | Admitting: Internal Medicine

## 2017-07-26 VITALS — Wt 118.4 lb

## 2017-07-26 DIAGNOSIS — I272 Pulmonary hypertension, unspecified: Secondary | ICD-10-CM | POA: Diagnosis not present

## 2017-07-26 DIAGNOSIS — F4323 Adjustment disorder with mixed anxiety and depressed mood: Secondary | ICD-10-CM | POA: Diagnosis not present

## 2017-07-26 DIAGNOSIS — F329 Major depressive disorder, single episode, unspecified: Secondary | ICD-10-CM | POA: Diagnosis not present

## 2017-07-26 NOTE — Progress Notes (Signed)
Daily Session Note  Patient Details  Name: Kayla Mack MRN: [id number] Date of Birth: 12/24/1945 Referring Provider:     Pulmonary Rehab Walk Test from 07/07/2017 in [place]  Referring Provider  Dr. Lake Bells      Encounter Date: 07/26/2017  Check In:     Session Check In - 07/26/17 1025      Check-In   Location [facility]   Staff Present Trish Fountain, RN, BSN;Lisa Ysidro Evert, RN;Corie Vavra, MS, ACSM RCEP, Exercise Physiologist   Supervising physician immediately available to respond to emergencies [facility] immediately available   Physician(s) Dr. Zigmund Daniel   Medication changes reported     No   Fall or balance concerns reported    No   Tobacco Cessation No Change   Warm-up and Cool-down Performed as group-led instruction   Resistance Training Performed Yes   VAD Patient? No     Pain Assessment   Currently in Pain? No/denies   Multiple Pain Sites No      Capillary Blood Glucose: No results found for this or any previous visit (from the past 24 hour(s)).      Exercise Prescription Changes - 07/26/17 1300      Response to Exercise   Blood Pressure (Admit) 140/68   Blood Pressure (Exercise) 138/62   Blood Pressure (Exit) 118/60   Heart Rate (Admit) 68 bpm   Heart Rate (Exercise) 87 bpm   Heart Rate (Exit) 68 bpm   Oxygen Saturation (Admit) 96 %   Oxygen Saturation (Exercise) 88 %   Oxygen Saturation (Exit) 98 %   Rating of Perceived Exertion (Exercise) 13   Perceived Dyspnea (Exercise) 3   Duration Progress to 45 minutes of aerobic exercise without signs/symptoms of physical distress   Intensity THRR unchanged     Progression   Progression Continue to progress workloads to maintain intensity without signs/symptoms of physical distress.     Resistance Training   Training Prescription Yes   Weight orange bands   Reps 10-15   Time 10 Minutes     Oxygen   Oxygen Continuous   Liters 6     Recumbant Bike   Level 3   Minutes 17     NuStep   Level 2   Minutes 17   METs 2.9      History  Smoking Status  . Former Smoker  . Packs/day: 1.50  . Years: 40.00  . Types: Cigarettes  . Quit date: 03/18/2017  Smokeless Tobacco  . Never Used    Goals Met:  Exercise tolerated well No report of cardiac concerns or symptoms Strength training completed today  Goals Unmet:  Not Applicable  Comments: Service time is from 10:30a to 12:30p    Dr. Rush Farmer is Medical Director for Pulmonary Rehab at [facility].

## 2017-07-27 ENCOUNTER — Other Ambulatory Visit: Payer: Self-pay

## 2017-07-27 ENCOUNTER — Ambulatory Visit: Payer: Self-pay

## 2017-07-27 NOTE — Patient Outreach (Signed)
[facility]) Care Management  [place]  07/27/2017   Kayla Mack Jul 12, 1946 [id number]  Subjective: RN Health Coach called and spoke with the patient for monthly call.  HIPAA verified. The patient states that she has started with pulmonary rehab and she is very impressed by them. After seeing them she really understands how serious her condition is. Patient states that she uses 3 liters of oxy gem at home and 6 liters of oxygen at rehab.  The patient stated that she is interested in receiving an [employer] portable oxygen and wanted the number for [facility].  RN Health Coach looked up the number for the [facility] and gave it to her. RN Health Coach addressed her medications with her and she stated that she is no longer using her Nicoderm to quit smoking. The patient states she is trying to quit cold [place].  RN Health Coach told the patient if she starts to have any problems quitting to call her physician office for them to help her. The patient states that she received he flu and Pneumonia shot last week at [facility] on [address] she was unable to tell me the specific day. Patient stated that she received information that was mailed to her last month and has read it.  RN Health Coach reviewed the COPD action Plan with her.  She verbalized understanding.  Objective:   Encounter Medications:  Outpatient Encounter Prescriptions as of 07/27/2017  Medication Sig Note  . acetaminophen (TYLENOL) 500 MG tablet Take 1,000 mg by mouth 2 (two) times daily as needed for pain.    . Albuterol Sulfate (VENTOLIN HFA IN) Inhale 2 puffs into the lungs 4 (four) times daily as needed.   [name] ELLIPTA 62.5-25 MCG/INH AEPB Inhale 1 puff into the lungs daily.   . bisoprolol (ZEBETA) 5 MG tablet Take 1 tablet (5 mg total) by mouth daily.   [name] buPROPion (WELLBUTRIN SR) 150 MG 12 hr tablet Take 150 mg by mouth daily.   . calcium-vitamin D (OSCAL WITH D) 500-200 MG-UNIT per  tablet Take 1 tablet by mouth 2 (two) times daily.   . clonazePAM (KLONOPIN) 0.5 MG tablet Take 1 tablet (0.5 mg total) by mouth 2 (two) times daily as needed for anxiety. (Patient taking differently: Take 0.5 mg by mouth daily as needed for anxiety. )   . clopidogrel (PLAVIX) 75 MG tablet Take 1 tablet (75 mg total) by mouth daily.   . feeding supplement, ENSURE ENLIVE, (ENSURE ENLIVE) LIQD Take 237 mLs by mouth 2 (two) times daily between meals.   . lovastatin (MEVACOR) 40 MG tablet Take 40 mg by mouth every morning.   [name] PARoxetine (PAXIL) 20 MG tablet Take 20 mg by mouth daily.   . Vitamin D, Ergocalciferol, (DRISDOL) 50000 units CAPS capsule Take 50,000 Units by mouth every 7 (seven) days. 05/04/2017: Sundays  . fluticasone (FLOVENT HFA) 110 MCG/ACT inhaler Inhale 2 puffs into the lungs 2 (two) times daily. (Patient not taking: Reported on 07/27/2017)   . nicotine (NICODERM CQ - DOSED IN MG/24 HOURS) 21 mg/24hr patch Place 21 mg onto the skin daily.   Enid Cutter HFA 108 (90 Base) MCG/ACT inhaler     No facility-administered encounter medications on file as of 07/27/2017.     Functional Status:  In your present state of health, do you have any difficulty performing the following activities: 05/19/2017 05/04/2017  Hearing? N N  Vision? N N  Difficulty concentrating or making decisions?  N N  Walking or climbing stairs? N N  Dressing or bathing? N N  Doing errands, shopping? Y N  Preparing Food and eating ? N -  Using the Toilet? N -  In the past six months, have you accidently leaked urine? N -  Do you have problems with loss of bowel control? N -  Managing your Medications? N -  Managing your Finances? N -  Housekeeping or managing your Housekeeping? Y -  Some recent data might be hidden    Fall/Depression Screening: Fall Risk  07/27/2017 07/04/2017 07/01/2017  Falls in the past year? No No No  Risk for fall due to : - - Impaired balance/gait   PHQ 2/9 Scores 07/27/2017 07/04/2017  07/01/2017 05/19/2017 05/16/2017  PHQ - 2 Score _0 PHQ- 9 Score 5 - _1 Assessment: Patient continues to benefit from health coach outreach for disease management and support.   THN CM Care Plan Problem One     Most Recent Value  Care Plan Problem One  COPD knowledge deficit  Role Documenting the Problem One  Health Coach  Care Plan for Problem One  Active  THN Long Term Goal   Patient will be able to explain COPD zones on action plan within the next 90 days   THN Long Term Goal Start Date  07/04/17 [goal continued]  Interventions for Problem One Long Term Goal  RN Health Coach reviewed COPD zones with the patient.  THN CM Short Term Goal #1   Patient will be able to describe COPD red zone within 30 days.    THN CM Short Term Goal #1 Start Date  07/04/17 [Goal continued]  Interventions for Short Term Goal #1  RN Health Coach reviewed with patient red zone on action plan.  Action plan sent to patient   [facility] CM Short Term Goal #2   Patient will be able to describe techniques of controlling breath during episodes of shortness breath within 30 days.   THN CM Short Term Goal #2 Start Date  05/19/17  [facility] CM Short Term Goal #2 Met Date  07/04/17  Interventions for Short Term Goal #2  patient using pursed lip breathing       Plan: [facility] will provide ongoing education for patient on COPD through phone calls and sending printed information to patient for further discussion.  RN Health Coach will contact patient in the month of November and patient agrees to next outreach.

## 2017-07-28 ENCOUNTER — Encounter (HOSPITAL_COMMUNITY): Payer: Medicare Other

## 2017-08-02 ENCOUNTER — Encounter (HOSPITAL_COMMUNITY)
Admission: RE | Admit: 2017-08-02 | Discharge: 2017-08-02 | Disposition: A | Discharge status: Home / Self Care | Payer: Medicare Other | Source: Ambulatory Visit | Attending: Internal Medicine | Admitting: Internal Medicine

## 2017-08-02 VITALS — Wt 119.7 lb

## 2017-08-02 DIAGNOSIS — I272 Pulmonary hypertension, unspecified: Secondary | ICD-10-CM

## 2017-08-02 NOTE — Progress Notes (Signed)
Daily Session Note  Patient Details  Name: Kayla Mack MRN: [id number] Date of Birth: 1946/01/29 Referring Provider:     Pulmonary Rehab Walk Test from 07/07/2017 in [place]  Referring Provider  Dr. Lake Bells      Encounter Date: 08/02/2017  Check In:     Session Check In - 08/02/17 1224      Check-In   Location [facility]      Capillary Blood Glucose: No results found for this or any previous visit (from the past 24 hour(s)).      Exercise Prescription Changes - 08/02/17 1200      Response to Exercise   Blood Pressure (Admit) 124/64   Blood Pressure (Exercise) 130/70   Blood Pressure (Exit) 106/66   Heart Rate (Admit) 63 bpm   Heart Rate (Exercise) 84 bpm   Heart Rate (Exit) 67 bpm   Oxygen Saturation (Admit) 96 %   Oxygen Saturation (Exercise) 86 %   Oxygen Saturation (Exit) 91 %   Rating of Perceived Exertion (Exercise) 13   Perceived Dyspnea (Exercise) 1   Duration Progress to 45 minutes of aerobic exercise without signs/symptoms of physical distress   Intensity THRR unchanged     Progression   Progression Continue to progress workloads to maintain intensity without signs/symptoms of physical distress.     Resistance Training   Training Prescription Yes   Weight orange bands   Reps 10-15   Time 10 Minutes     Oxygen   Oxygen Continuous   Liters 6     Recumbant Bike   Level 3   Minutes 17     NuStep   Level 3   Minutes 17   METs 2.7     Track   Laps 15   Minutes 17      History  Smoking Status  . Former Smoker  . Packs/day: 1.50  . Years: 40.00  . Types: Cigarettes  . Quit date: 03/18/2017  Smokeless Tobacco  . Never Used    Goals Met:  Exercise tolerated well No report of cardiac concerns or symptoms Strength training completed today  Goals Unmet:  Not Applicable  Comments: Service time is from 10:30a to 12:10p    Dr. Rush Farmer is Medical Director for  Pulmonary Rehab at [facility].

## 2017-08-04 ENCOUNTER — Encounter (HOSPITAL_COMMUNITY)
Admission: RE | Admit: 2017-08-04 | Discharge: 2017-08-04 | Disposition: A | Discharge status: Home / Self Care | Payer: Medicare Other | Source: Ambulatory Visit | Attending: Internal Medicine | Admitting: Internal Medicine

## 2017-08-04 VITALS — Wt 120.6 lb

## 2017-08-04 DIAGNOSIS — I272 Pulmonary hypertension, unspecified: Secondary | ICD-10-CM | POA: Diagnosis not present

## 2017-08-04 NOTE — Progress Notes (Signed)
Daily Session Note  Patient Details  Name: Kayla Mack MRN: [id number] Date of Birth: Nov 20, 1945 Referring Provider:     Pulmonary Rehab Walk Test from 07/07/2017 in [place]  Referring Provider  Dr. Lake Bells      Encounter Date: 08/04/2017  Check In:     Session Check In - 08/04/17 1021      Check-In   [facility]   Staff Present Su Hilt, MS, ACSM RCEP, Exercise Physiologist;Lisa Ysidro Evert, RN;Alaiza Yau Rollene Rotunda, RN, BSN   Supervising physician immediately available to respond to emergencies [facility] immediately available   Physician(s) Dr. Horris Latino   Medication changes reported     No   Fall or balance concerns reported    No   Tobacco Cessation No Change   Warm-up and Cool-down Performed as group-led instruction   Resistance Training Performed Yes   VAD Patient? No     Pain Assessment   Currently in Pain? No/denies   Multiple Pain Sites No      Capillary Blood Glucose: No results found for this or any previous visit (from the past 24 hour(s)).      Exercise Prescription Changes - 08/04/17 1258      Response to Exercise   Blood Pressure (Admit) 124/70   Blood Pressure (Exercise) 114/66   Blood Pressure (Exit) 134/70   Heart Rate (Admit) 72 bpm   Heart Rate (Exercise) 85 bpm   Heart Rate (Exit) 78 bpm   Oxygen Saturation (Admit) 91 %   Oxygen Saturation (Exercise) 90 %   Oxygen Saturation (Exit) 92 %   Rating of Perceived Exertion (Exercise) 11   Perceived Dyspnea (Exercise) 1   Duration Progress to 45 minutes of aerobic exercise without signs/symptoms of physical distress   Intensity THRR unchanged     Progression   Progression Continue to progress workloads to maintain intensity without signs/symptoms of physical distress.     Resistance Training   Training Prescription Yes   Weight orange bands   Reps 10-15   Time 10 Minutes     Oxygen   Oxygen Continuous   Liters 6     NuStep   Level 3   Minutes 17   METs 3      History  Smoking Status  . Former Smoker  . Packs/day: 1.50  . Years: 40.00  . Types: Cigarettes  . Quit date: 03/18/2017  Smokeless Tobacco  . Never Used    Goals Met:  Exercise tolerated well No report of cardiac concerns or symptoms Strength training completed today  Goals Unmet:  Not Applicable  Comments: Service time is from 1030 to 1215   Dr. Rush Farmer is Medical Director for Pulmonary Rehab at [facility].

## 2017-08-08 NOTE — Progress Notes (Signed)
Pulmonary Individual Treatment Plan  Patient Details  Name: Kayla Mack MRN: [id number] Date of Birth: July 27, 1946 Referring Provider:     Pulmonary Rehab Walk Test from 07/07/2017 in [place]  Referring Provider  Dr. Lake Bells      Initial Encounter Date:    Pulmonary Rehab Walk Test from 07/07/2017 in [place]  Date  07/12/17  Referring Provider  Dr. Lake Bells      Visit Diagnosis: Pulmonary hypertension ([place])  Patient's Home Medications on Admission:   Current Outpatient Prescriptions:  .  acetaminophen (TYLENOL) 500 MG tablet, Take 1,000 mg by mouth 2 (two) times daily as needed for pain. , Disp: , Rfl:  .  Albuterol Sulfate (VENTOLIN HFA IN), Inhale 2 puffs into the lungs 4 (four) times daily as needed., Disp: , Rfl:  .  ANORO ELLIPTA 62.5-25 MCG/INH AEPB, Inhale 1 puff into the lungs daily., Disp: , Rfl:  .  bisoprolol (ZEBETA) 5 MG tablet, Take 1 tablet (5 mg total) by mouth daily., Disp: 30 tablet, Rfl: 0 .  buPROPion (WELLBUTRIN SR) 150 MG 12 hr tablet, Take 150 mg by mouth daily., Disp: , Rfl:  .  calcium-vitamin D (OSCAL WITH D) 500-200 MG-UNIT per tablet, Take 1 tablet by mouth 2 (two) times daily., Disp: , Rfl:  .  clonazePAM (KLONOPIN) 0.5 MG tablet, Take 1 tablet (0.5 mg total) by mouth 2 (two) times daily as needed for anxiety. (Patient taking differently: Take 0.5 mg by mouth daily as needed for anxiety. ), Disp: 10 tablet, Rfl: 0 .  clopidogrel (PLAVIX) 75 MG tablet, Take 1 tablet (75 mg total) by mouth daily., Disp: 30 tablet, Rfl: 0 .  feeding supplement, ENSURE ENLIVE, (ENSURE ENLIVE) LIQD, Take 237 mLs by mouth 2 (two) times daily between meals., Disp: 237 mL, Rfl: 12 .  fluticasone (FLOVENT HFA) 110 MCG/ACT inhaler, Inhale 2 puffs into the lungs 2 (two) times daily. (Patient not taking: Reported on 07/27/2017), Disp: 1 Inhaler, Rfl: 0 .  lovastatin (MEVACOR) 40 MG tablet, Take 40 mg by mouth  every morning., Disp: , Rfl:  .  nicotine (NICODERM CQ - DOSED IN MG/24 HOURS) 21 mg/24hr patch, Place 21 mg onto the skin daily., Disp: , Rfl:  .  PARoxetine (PAXIL) 20 MG tablet, Take 20 mg by mouth daily., Disp: , Rfl:  .  VENTOLIN HFA 108 (90 Base) MCG/ACT inhaler, , Disp: , Rfl:  .  Vitamin D, Ergocalciferol, (DRISDOL) 50000 units CAPS capsule, Take 50,000 Units by mouth every 7 (seven) days., Disp: , Rfl:   Past Medical History: Past Medical History:  Diagnosis Date  . Anxiety   . COPD (chronic obstructive pulmonary disease) ([place])   . Depression   . Hyperlipidemia   . Hypertension     Tobacco Use: History  Smoking Status  . Former Smoker  . Packs/day: 1.50  . Years: 40.00  . Types: Cigarettes  . Quit date: 03/18/2017  Smokeless Tobacco  . Never Used    Labs: Recent Review Flowsheet Data    Labs for ITP Cardiac and Pulmonary Rehab Latest Ref Rng & Units 05/04/2017 05/05/2017 05/07/2017 05/09/2017 05/09/2017   Cholestrol 0 - 200 mg/dL 118 - - - -   LDLCALC 0 - 99 mg/dL 72 - - - -   HDL >40 mg/dL 31(L) - - - -   Trlycerides <150 mg/dL 76 - - - -   Hemoglobin A1c 4.8 - 5.6 % 5.8(H) - - - -  PHART 7.350 - 7.450 - 7.249(L) 7.311(L) 7.423 -   PCO2ART 32.0 - 48.0 mmHg - 53.9(H) 59.3(H) 56.6(H) -   HCO3 20.0 - 28.0 mmol/L 26.4 22.8 29.0(H) 36.9(H) 39.0(H)   TCO2 0 - 100 mmol/L 28 - - 39 41   ACIDBASEDEF 0.0 - 2.0 mmol/L - 3.4(H) - - -   O2SAT % 84.0 98.8 98.2 90.0 65.0      Capillary Blood Glucose: Lab Results  Component Value Date   GLUCAP 148 (H) 05/06/2017     Pulmonary Assessment Scores:     Pulmonary Assessment Scores    Row Name 07/13/17 1405         ADL UCSD   ADL Phase Entry     SOB Score total 55       CAT Score   CAT Score 23  Entry        Pulmonary Function Assessment:     Pulmonary Function Assessment - 07/01/17 1055      Breath   Bilateral Breath Sounds Clear;Decreased   Shortness of Breath Yes;Limiting activity      Exercise Target  Goals:    Exercise Program Goal: Individual exercise prescription set with THRR, safety & activity barriers. Participant demonstrates ability to understand and report RPE using BORG scale, to self-measure pulse accurately, and to acknowledge the importance of the exercise prescription.  Exercise Prescription Goal: Starting with aerobic activity 30 plus minutes a day, 3 days per week for initial exercise prescription. Provide home exercise prescription and guidelines that participant acknowledges understanding prior to discharge.  Activity Barriers & Risk Stratification:     Activity Barriers & Cardiac Risk Stratification - 07/01/17 1034      Activity Barriers & Cardiac Risk Stratification   Activity Barriers Deconditioning;Muscular Weakness;Shortness of Breath      6 Minute Walk:     6 Minute Walk    Row Name 07/12/17 0735         6 Minute Walk   Phase Initial     Distance 1200 feet     Walk Time 6 minutes     # of Rest Breaks 0     MPH 2.27     METS 2.76     RPE 11     Perceived Dyspnea  1     Symptoms No     Resting HR 77 bpm     Resting BP 149/80     Resting Oxygen Saturation  86 %  2 liters/88% on 4 liters     Exercise Oxygen Saturation  during 6 min walk 85 %     Max Ex. HR 100 bpm     Max Ex. BP 163/75     2 Minute Post BP 174/82       Interval HR   1 Minute HR 77     2 Minute HR 78     3 Minute HR 95     4 Minute HR 100     5 Minute HR 95     6 Minute HR 93     2 Minute Post HR 74     Interval Heart Rate? Yes       Interval Oxygen   Interval Oxygen? Yes     Baseline Oxygen Saturation % 86 %     1 Minute Oxygen Saturation % 94 %     1 Minute Liters of Oxygen 4 L     2 Minute Oxygen Saturation % 91 %  2 Minute Liters of Oxygen 4 L     3 Minute Oxygen Saturation % 87 %     3 Minute Liters of Oxygen 4 L     4 Minute Oxygen Saturation % 85 %     4 Minute Liters of Oxygen 6 L     5 Minute Oxygen Saturation % 86 %     5 Minute Liters of Oxygen 6  L     6 Minute Oxygen Saturation % 88 %     6 Minute Liters of Oxygen 6 L     2 Minute Post Oxygen Saturation % 97 %     2 Minute Post Liters of Oxygen 6 L        Oxygen Initial Assessment:     Oxygen Initial Assessment - 07/12/17 0742      Initial 6 min Walk   Oxygen Used Continuous;E-Tanks  started on 2 liters and titrated up   Liters per minute 6     Program Oxygen Prescription   Program Oxygen Prescription Continuous;E-Tanks   Liters per minute 6      Oxygen Re-Evaluation:     Oxygen Re-Evaluation    Row Name 08/05/17 1213             Program Oxygen Prescription   Program Oxygen Prescription Continuous;E-Tanks       Liters per minute 6         Home Oxygen   Home Oxygen Device E-Tanks       Sleep Oxygen Prescription Continuous       Liters per minute 3         Goals/Expected Outcomes   Short Term Goals To learn and exhibit compliance with exercise, home and travel O2 prescription;To learn and understand importance of monitoring SPO2 with pulse oximeter and demonstrate accurate use of the pulse oximeter.;To learn and understand importance of maintaining oxygen saturations>88%;To learn and demonstrate proper pursed lip breathing techniques or other breathing techniques.;To learn and demonstrate proper use of respiratory medications       Long  Term Goals Exhibits compliance with exercise, home and travel O2 prescription;Verbalizes importance of monitoring SPO2 with pulse oximeter and return demonstration;Maintenance of O2 saturations>88%;Exhibits proper breathing techniques, such as pursed lip breathing or other method taught during program session;Compliance with respiratory medication;Demonstrates proper use of MDI's       Comments patient demonstrates correct use of home o2 by wearing her oxygen into pulmonary rehab on correct dosage with correct equiptment.        Goals/Expected Outcomes patient will verbalize compliance with home O2          Oxygen Discharge  (Final Oxygen Re-Evaluation):     Oxygen Re-Evaluation - 08/05/17 1213      Program Oxygen Prescription   Program Oxygen Prescription Continuous;E-Tanks   Liters per minute 6     Home Oxygen   Home Oxygen Device E-Tanks   Sleep Oxygen Prescription Continuous   Liters per minute 3     Goals/Expected Outcomes   Short Term Goals To learn and exhibit compliance with exercise, home and travel O2 prescription;To learn and understand importance of monitoring SPO2 with pulse oximeter and demonstrate accurate use of the pulse oximeter.;To learn and understand importance of maintaining oxygen saturations>88%;To learn and demonstrate proper pursed lip breathing techniques or other breathing techniques.;To learn and demonstrate proper use of respiratory medications   Long  Term Goals Exhibits compliance with exercise, home and travel O2 prescription;Verbalizes importance of monitoring SPO2  with pulse oximeter and return demonstration;Maintenance of O2 saturations>88%;Exhibits proper breathing techniques, such as pursed lip breathing or other method taught during program session;Compliance with respiratory medication;Demonstrates proper use of MDI's   Comments patient demonstrates correct use of home o2 by wearing her oxygen into pulmonary rehab on correct dosage with correct equiptment.    Goals/Expected Outcomes patient will verbalize compliance with home O2      Initial Exercise Prescription:     Initial Exercise Prescription - 07/12/17 0700      Date of Initial Exercise RX and Referring Provider   Date 07/12/17   Referring Provider Dr. Lake Bells     Oxygen   Oxygen Continuous   Liters 6     Recumbant Bike   Level 2   Minutes 17     NuStep   Level 2   Minutes 17   METs 1.3     Track   Laps 5   Minutes 17     Prescription Details   Frequency (times per week) 2   Duration Progress to 45 minutes of aerobic exercise without signs/symptoms of physical distress     Intensity    THRR 40-80% of Max Heartrate 59-118   Ratings of Perceived Exertion 11-13   Perceived Dyspnea 0-4     Progression   Progression Continue progressive overload as per policy without signs/symptoms or physical distress.     Resistance Training   Training Prescription Yes   Weight orange bands   Reps 10-15      Perform Capillary Blood Glucose checks as needed.  Exercise Prescription Changes:     Exercise Prescription Changes    Row Name 07/14/17 1200 07/19/17 1200 07/21/17 1200 07/26/17 1300 08/02/17 1200     Response to Exercise   Blood Pressure (Admit) 102/60 118/62 124/70 140/68 124/64   Blood Pressure (Exercise) 152/64 140/60 132/72 138/62 130/70   Blood Pressure (Exit) 108/60 106/66 112/60 118/60 106/66   Heart Rate (Admit) 72 bpm 65 bpm 75 bpm 68 bpm 63 bpm   Heart Rate (Exercise) 107 bpm 84 bpm 122 bpm 87 bpm 84 bpm   Heart Rate (Exit) 74 bpm 70 bpm 74 bpm 68 bpm 67 bpm   Oxygen Saturation (Admit) 94 % 98 % 94 % 96 % 96 %   Oxygen Saturation (Exercise) 85 %  O2 increased to 6L sat improved 88% 88 % 87 % 88 % 86 %   Oxygen Saturation (Exit) 95 % 97 % 95 % 98 % 91 %   Rating of Perceived Exertion (Exercise) _0 Perceived Dyspnea (Exercise) _1 Duration Progress to 45 minutes of aerobic exercise without signs/symptoms of physical distress Progress to 45 minutes of aerobic exercise without signs/symptoms of physical distress Progress to 45 minutes of aerobic exercise without signs/symptoms of physical distress Progress to 45 minutes of aerobic exercise without signs/symptoms of physical distress Progress to 45 minutes of aerobic exercise without signs/symptoms of physical distress   Intensity Other (comment)  40-80% of HRR Other (comment)  40-80% of HRR THRR unchanged THRR unchanged THRR unchanged     Progression   Progression Continue to progress workloads to maintain intensity without signs/symptoms of physical distress. Continue to progress workloads to  maintain intensity without signs/symptoms of physical distress. Continue to progress workloads to maintain intensity without signs/symptoms of physical distress. Continue to progress workloads to maintain intensity without signs/symptoms of physical distress. Continue to progress workloads to maintain intensity  without signs/symptoms of physical distress.     Resistance Training   Training Prescription _0    Weight _1    Reps 10-15 10-15 10-15 10-15 10-15   Time 10 Minutes 10 Minutes 10 Minutes 10 Minutes 10 Minutes     Oxygen   Oxygen _2    Liters _3 Recumbant Bike   Level _4 Minutes _5 NuStep   Level _6 Minutes _7 METs 2.2 2.2 2.4 2.9 2.7     Track   Laps  - 11  -  - 15   Minutes  - 17  -  - 17   Row Name 08/04/17 1258             Response to Exercise   Blood Pressure (Admit) 124/70       Blood Pressure (Exercise) 114/66       Blood Pressure (Exit) 134/70       Heart Rate (Admit) 72 bpm       Heart Rate (Exercise) 85 bpm       Heart Rate (Exit) 78 bpm       Oxygen Saturation (Admit) 91 %       Oxygen Saturation (Exercise) 90 %       Oxygen Saturation (Exit) 92 %       Rating of Perceived Exertion (Exercise) 11       Perceived Dyspnea (Exercise) 1       Duration Progress to 45 minutes of aerobic exercise without signs/symptoms of physical distress       Intensity THRR unchanged         Progression   Progression Continue to progress workloads to maintain intensity without signs/symptoms of physical distress.         Resistance Training   Training Prescription Yes       Weight orange bands       Reps 10-15       Time 10 Minutes         Oxygen   Oxygen Continuous       Liters 6         NuStep   Level 3       Minutes 17       METs 3          Exercise  Comments:   Exercise Goals and Review:     Exercise Goals    Row Name 07/01/17 1035             Exercise Goals   Increase Physical Activity Yes       Intervention Provide advice, education, support and counseling about physical activity/exercise needs.;Develop an individualized exercise prescription for aerobic and resistive training based on initial evaluation findings, risk stratification, comorbidities and participant's personal goals.       Expected Outcomes Achievement of increased cardiorespiratory fitness and enhanced flexibility, muscular endurance and strength shown through measurements of functional capacity and personal statement of participant.       Increase Strength and Stamina Yes       Intervention Provide advice, education, support and counseling about physical activity/exercise needs.;Develop an individualized exercise prescription for aerobic and resistive training based on initial evaluation findings, risk stratification, comorbidities and participant's personal goals.  Expected Outcomes Achievement of increased cardiorespiratory fitness and enhanced flexibility, muscular endurance and strength shown through measurements of functional capacity and personal statement of participant.       Able to understand and use rate of perceived exertion (RPE) scale Yes       Intervention Provide education and explanation on how to use RPE scale       Expected Outcomes Short Term: Able to use RPE daily in rehab to express subjective intensity level;Long Term:  Able to use RPE to guide intensity level when exercising independently       Able to understand and use Dyspnea scale Yes       Intervention Provide education and explanation on how to use Dyspnea scale       Expected Outcomes Short Term: Able to use Dyspnea scale daily in rehab to express subjective sense of shortness of breath during exertion;Long Term: Able to use Dyspnea scale to guide intensity level when exercising  independently       Knowledge and understanding of Target Heart Rate Range (THRR) Yes       Intervention Provide education and explanation of THRR including how the numbers were predicted and where they are located for reference       Expected Outcomes Short Term: Able to state/look up THRR;Long Term: Able to use THRR to govern intensity when exercising independently;Short Term: Able to use daily as guideline for intensity in rehab       Understanding of Exercise Prescription Yes       Intervention Provide education, explanation, and written materials on patient's individual exercise prescription       Expected Outcomes Short Term: Able to explain program exercise prescription;Long Term: Able to explain home exercise prescription to exercise independently          Exercise Goals Re-Evaluation :     Exercise Goals Re-Evaluation    Row Name 08/08/17 0720             Exercise Goal Re-Evaluation   Exercise Goals Review Increase Strength and Stamina;Increase Physical Activity;Able to understand and use rate of perceived exertion (RPE) scale;Able to understand and use Dyspnea scale;Knowledge and understanding of Target Heart Rate Range (THRR);Understanding of Exercise Prescription       Comments Patient is progressing well in program. She has a wonderful attitude and it motivated to work hard while she is here. She has walked up to 15 laps (200 ft each) in 15 minutes.        Expected Outcomes Through exercise at rehab and at home the patient will increase physical activity, strength, and decrease shortness of breath.           Discharge Exercise Prescription (Final Exercise Prescription Changes):     Exercise Prescription Changes - 08/04/17 1258      Response to Exercise   Blood Pressure (Admit) 124/70   Blood Pressure (Exercise) 114/66   Blood Pressure (Exit) 134/70   Heart Rate (Admit) 72 bpm   Heart Rate (Exercise) 85 bpm   Heart Rate (Exit) 78 bpm   Oxygen Saturation (Admit) 91 %    Oxygen Saturation (Exercise) 90 %   Oxygen Saturation (Exit) 92 %   Rating of Perceived Exertion (Exercise) 11   Perceived Dyspnea (Exercise) 1   Duration Progress to 45 minutes of aerobic exercise without signs/symptoms of physical distress   Intensity THRR unchanged     Progression   Progression Continue to progress workloads to maintain intensity without signs/symptoms of physical  distress.     Resistance Training   Training Prescription Yes   Weight orange bands   Reps 10-15   Time 10 Minutes     Oxygen   Oxygen Continuous   Liters 6     NuStep   Level 3   Minutes 17   METs 3      Nutrition:  Target Goals: Understanding of nutrition guidelines, daily intake of sodium <156m, cholesterol <2034m calories 30% from fat and 7% or less from saturated fats, daily to have 5 or more servings of fruits and vegetables.  Biometrics:     Pre Biometrics - 07/01/17 1134      Pre Biometrics   Grip Strength 15 kg       Nutrition Therapy Plan and Nutrition Goals:     Nutrition Therapy & Goals - 07/04/17 1212      Nutrition Therapy   Diet Therapeutic Lifestyle Changes     Personal Nutrition Goals   Nutrition Goal The pt will recognize symptoms that can interfere with adequate oral intake, such as shortness of breath, N/V, early satiety, fatigue, ability to secure and prepare food, taste and smell changes, chewing/swallowing difficulties, and/ or pain when eating.     Intervention Plan   Intervention Prescribe, educate and counsel regarding individualized specific dietary modifications aiming towards targeted core components such as weight, hypertension, lipid management, diabetes, heart failure and other comorbidities.   Expected Outcomes Short Term Goal: Understand basic principles of dietary content, such as calories, fat, sodium, cholesterol and nutrients.;Long Term Goal: Adherence to prescribed nutrition plan.      Nutrition Discharge: Rate Your Plate Scores:      Nutrition Assessments - 07/04/17 1212      Rate Your Plate Scores   Pre Score --  returned form; not completed      Nutrition Goals Re-Evaluation:   Nutrition Goals Discharge (Final Nutrition Goals Re-Evaluation):   Psychosocial: Target Goals: Acknowledge presence or absence of significant depression and/or stress, maximize coping skills, provide positive support system. Participant is able to verbalize types and ability to use techniques and skills needed for reducing stress and depression.  Initial Review & Psychosocial Screening:     Initial Psych Review & Screening - 07/01/17 1059      Initial Review   Current issues with Current Depression;Current Anxiety/Panic     Family Dynamics   Good Support System? No   Strains Intra-family strains;Illness and family care strain     Barriers   Psychosocial barriers to participate in program Psychosocial barriers identified (see note)     Screening Interventions   Interventions Encouraged to exercise;Other (comment)   Comments currently sees counselor weekly and more often as needed related to caregiver strain. also member of caregiver support group at [facility]      Quality of Life Scores:   PHQ-9: Recent Review Flowsheet Data    [facility] 07/[facility]2017 07/04/2017 07/01/2017 05/19/2017 05/16/2017   Decreased Interest _0 Down, Depressed, Hopeless _1 PHQ - 2 Score _2 Altered sleeping 0 - _3 Tired, decreased energy 1 - _4 Change in appetite 1 - _5 Feeling bad or failure about yourself  0 - _6 Trouble concentrating 1 - _7 Moving slowly or fidgety/restless 0 - 0 1 1   Suicidal  thoughts 0 - 0 0 0   PHQ-9 Score 5 - _0 Difficult doing work/chores - - Somewhat difficult Very difficult Very difficult     Interpretation of Total Score  Total Score Depression Severity:  1-4 = Minimal depression, 5-9 = Mild depression, 10-14 = Moderate depression, 15-19 =  Moderately severe depression, 20-27 = Severe depression   Psychosocial Evaluation and Intervention:     Psychosocial Evaluation - 07/01/17 1102      Psychosocial Evaluation & Interventions   Interventions Stress management education;Encouraged to exercise with the program and follow exercise prescription   Expected Outcomes psychosocial issues will not become a barrier to participation to pulmonary rehab   Continue Psychosocial Services  Follow up required by staff      Psychosocial Re-Evaluation:     Psychosocial Re-Evaluation    [place]ace[place] Name 08/05/17 1231             Psychosocial Re-Evaluation   Current issues with Current Stress Concerns       Comments her stress concerns are being managed and are not barriers to her participation in pulmonary rehab       Expected Outcomes patient will remain free from psychosocial barriers       Interventions Encouraged to attend Pulmonary Rehabilitation for the exercise       Continue Psychosocial Services  Follow up required by staff       Comments 27 year old mothers unwillingness to accept help from agencies in her home.         Initial Review   Source of Stress Concerns Family          Psychosocial Discharge (Final Psychosocial Re-Evaluation):     Psychosocial Re-Evaluation - 08/05/17 1231      Psychosocial Re-Evaluation   Current issues with Current Stress Concerns   Comments her stress concerns are being managed and are not barriers to her participation in pulmonary rehab   Expected Outcomes patient will remain free from psychosocial barriers   Interventions Encouraged to attend Pulmonary Rehabilitation for the exercise   Continue Psychosocial Services  Follow up required by staff   Comments 24 year old mothers unwillingness to accept help from agencies in her home.     Initial Review   Source of Stress Concerns Family      Education: Education Goals: Education classes will be provided on a weekly basis, covering  required topics. Participant will state understanding/return demonstration of topics presented.  Learning Barriers/Preferences:     Learning Barriers/Preferences - 07/01/17 1055      Learning Barriers/Preferences   Learning Barriers None   Learning Preferences Computer/Internet;Individual Instruction;Verbal Instruction;Written Material      Education Topics: Risk Factor Reduction:  -Group instruction that is supported by a PowerPoint presentation. Instructor discusses the definition of a risk factor, different risk factors for pulmonary disease, and how the heart and lungs work together.     Nutrition for Pulmonary Patient:  -Group instruction provided by PowerPoint slides, verbal discussion, and written materials to support subject matter. The instructor gives an explanation and review of healthy diet recommendations, which includes a discussion on weight management, recommendations for fruit and vegetable consumption, as well as protein, fluid, caffeine, fiber, sodium, sugar, and alcohol. Tips for eating when patients are short of breath are discussed.   PULMONARY REHAB OTHER RESPIRATORY from 08/04/2017 in [place]  Date  08/04/17  Educator  edna  Instruction Review Code  2- meets goals/outcomes  Pursed Lip Breathing:  -Group instruction that is supported by demonstration and informational handouts. Instructor discusses the benefits of pursed lip and diaphragmatic breathing and detailed demonstration on how to preform both.     Oxygen Safety:  -Group instruction provided by PowerPoint, verbal discussion, and written material to support subject matter. There is an overview of "What is Oxygen" and "Why do we need it".  Instructor also reviews how to create a safe environment for oxygen use, the importance of using oxygen as prescribed, and the risks of noncompliance. There is a brief discussion on traveling with oxygen and resources the patient  may utilize.   Oxygen Equipment:  -Group instruction provided by [facility] Staff utilizing handouts, written materials, and equipment demonstrations.   Signs and Symptoms:  -Group instruction provided by written material and verbal discussion to support subject matter. Warning signs and symptoms of infection, stroke, and heart attack are reviewed and when to call the physician/911 reinforced. Tips for preventing the spread of infection discussed.   Advanced Directives:  -Group instruction provided by verbal instruction and written material to support subject matter. Instructor reviews Advanced Directive laws and proper instruction for filling out document.   Pulmonary Video:  -Group video education that reviews the importance of medication and oxygen compliance, exercise, good nutrition, pulmonary hygiene, and pursed lip and diaphragmatic breathing for the pulmonary patient.   PULMONARY REHAB OTHER RESPIRATORY from 08/04/2017 in [place]  Date  07/14/17  Instruction Review Code  2- meets goals/outcomes      Exercise for the Pulmonary Patient:  -Group instruction that is supported by a PowerPoint presentation. Instructor discusses benefits of exercise, core components of exercise, frequency, duration, and intensity of an exercise routine, importance of utilizing pulse oximetry during exercise, safety while exercising, and options of places to exercise outside of rehab.     Pulmonary Medications:  -Verbally interactive group education provided by instructor with focus on inhaled medications and proper administration.   Anatomy and Physiology of the Respiratory System and Intimacy:  -Group instruction provided by PowerPoint, verbal discussion, and written material to support subject matter. Instructor reviews respiratory cycle and anatomical components of the respiratory system and their functions. Instructor also reviews differences in obstructive and  restrictive respiratory diseases with examples of each. Intimacy, Sex, and Sexuality differences are reviewed with a discussion on how relationships can change when diagnosed with pulmonary disease. Common sexual concerns are reviewed.   MD DAY -A group question and answer session with a medical doctor that allows participants to ask questions that relate to their pulmonary disease state.   PULMONARY REHAB OTHER RESPIRATORY from 08/04/2017 in [place]  Date  07/26/17  Educator  Dr. Nelda Marseille  Instruction Review Code  2- meets goals/outcomes      OTHER EDUCATION -Group or individual verbal, written, or video instructions that support the educational goals of the pulmonary rehab program.   Knowledge Questionnaire Score:     Knowledge Questionnaire Score - 07/13/17 1405      Knowledge Questionnaire Score   Pre Score 11/13      Core Components/Risk Factors/Patient Goals at Admission:     Personal Goals and Risk Factors at Admission - 07/01/17 1056      Core Components/Risk Factors/Patient Goals on Admission   Improve shortness of breath with ADL's Yes   Intervention Provide education, individualized exercise plan and daily activity instruction to help decrease symptoms of SOB with activities of daily living.  Expected Outcomes Short Term: Achieves a reduction of symptoms when performing activities of daily living.   Develop more efficient breathing techniques such as purse lipped breathing and diaphragmatic breathing; and practicing self-pacing with activity Yes   Intervention Provide education, demonstration and support about specific breathing techniuqes utilized for more efficient breathing. Include techniques such as pursed lipped breathing, diaphragmatic breathing and self-pacing activity.   Expected Outcomes Short Term: Participant will be able to demonstrate and use breathing techniques as needed throughout daily activities.   Increase  knowledge of respiratory medications and ability to use respiratory devices properly  Yes   Intervention Provide education and demonstration as needed of appropriate use of medications, inhalers, and oxygen therapy.   Expected Outcomes Short Term: Achieves understanding of medications use. Understands that oxygen is a medication prescribed by physician. Demonstrates appropriate use of inhaler and oxygen therapy.   Stress Yes   Intervention Offer individual and/or small group education and counseling on adjustment to heart disease, stress management and health-related lifestyle change. Teach and support self-help strategies.;Refer participants experiencing significant psychosocial distress to appropriate mental health specialists for further evaluation and treatment. When possible, include family members and significant others in education/counseling sessions.   Expected Outcomes Short Term: Participant demonstrates changes in health-related behavior, relaxation and other stress management skills, ability to obtain effective social support, and compliance with psychotropic medications if prescribed.;Long Term: Emotional wellbeing is indicated by absence of clinically significant psychosocial distress or social isolation.      Core Components/Risk Factors/Patient Goals Review:      Goals and Risk Factor Review    Row Name 08/05/17 1221             Core Components/Risk Factors/Patient Goals Review   Personal Goals Review Develop more efficient breathing techniques such as purse lipped breathing and diaphragmatic breathing and practicing self-pacing with activity.;Improve shortness of breath with ADL's;Increase knowledge of respiratory medications and ability to use respiratory devices properly.;Stress       Review patient is doing well in pulmonary rehab. She verbalizes an improvement in her shortness of breath and finds using pursed lip breathing extremely helpful and gives her the additional  breath she needs to do her ADLs. She learned alot about her medications on Dr. Elana Alm when she was able to ask and have explained by our medical director how her respiratory medications worked. her stress in ongoing but she states she is doing a better job managing it.       Expected Outcomes see admission outcomes          Core Components/Risk Factors/Patient Goals at Discharge (Final Review):      Goals and Risk Factor Review - 08/05/17 1221      Core Components/Risk Factors/Patient Goals Review   Personal Goals Review Develop more efficient breathing techniques such as purse lipped breathing and diaphragmatic breathing and practicing self-pacing with activity.;Improve shortness of breath with ADL's;Increase knowledge of respiratory medications and ability to use respiratory devices properly.;Stress   Review patient is doing well in pulmonary rehab. She verbalizes an improvement in her shortness of breath and finds using pursed lip breathing extremely helpful and gives her the additional breath she needs to do her ADLs. She learned alot about her medications on Dr. Elana Alm when she was able to ask and have explained by our medical director how her respiratory medications worked. her stress in ongoing but she states she is doing a better job managing it.   Expected Outcomes see admission outcomes  ITP Comments:   Comments: ITP REVIEW Pt is making expected progress toward pulmonary rehab goals after completing 6 sessions. Recommend continued exercise, life style modification, education, and utilization of breathing techniques to increase stamina and strength and decrease shortness of breath with exertion.

## 2017-08-09 ENCOUNTER — Encounter (HOSPITAL_COMMUNITY)
Admission: RE | Admit: 2017-08-09 | Discharge: 2017-08-09 | Disposition: A | Discharge status: Home / Self Care | Payer: Medicare Other | Source: Ambulatory Visit | Attending: Internal Medicine | Admitting: Internal Medicine

## 2017-08-09 VITALS — Wt 119.7 lb

## 2017-08-09 DIAGNOSIS — F4323 Adjustment disorder with mixed anxiety and depressed mood: Secondary | ICD-10-CM | POA: Diagnosis not present

## 2017-08-09 DIAGNOSIS — I272 Pulmonary hypertension, unspecified: Secondary | ICD-10-CM

## 2017-08-09 DIAGNOSIS — F329 Major depressive disorder, single episode, unspecified: Secondary | ICD-10-CM | POA: Diagnosis not present

## 2017-08-09 NOTE — Progress Notes (Signed)
Daily Session Note  Patient Details  Name: Kayla Mack MRN: [id number] Date of Birth: Jul 27, 1946 Referring Provider:     Pulmonary Rehab Walk Test from 07/07/2017 in [place]  Referring Provider  Dr. Lake Bells      Encounter Date: 08/09/2017  Check In:     Session Check In - 08/09/17 1030      Check-In   Location [facility]   Staff Present Rosebud Poles, RN, BSN;Kolbee Stallman Ysidro Evert, RN;Portia Rollene Rotunda, RN, BSN;Molly diVincenzo, MS, ACSM RCEP, Exercise Physiologist   Supervising physician immediately available to respond to emergencies [facility] immediately available   Physician(s) Dr. Horris Latino   Medication changes reported     No   Fall or balance concerns reported    No   Tobacco Cessation No Change   Warm-up and Cool-down Performed as group-led instruction   Resistance Training Performed Yes   VAD Patient? No     Pain Assessment   Currently in Pain? No/denies   Multiple Pain Sites No      Capillary Blood Glucose: No results found for this or any previous visit (from the past 24 hour(s)).      Exercise Prescription Changes - 08/09/17 1200      Response to Exercise   Blood Pressure (Admit) 130/62   Blood Pressure (Exercise) 124/70   Blood Pressure (Exit) 120/70   Heart Rate (Admit) 72 bpm   Heart Rate (Exercise) 83 bpm   Heart Rate (Exit) 70 bpm   Oxygen Saturation (Admit) 93 %   Oxygen Saturation (Exercise) 86 %   Oxygen Saturation (Exit) 98 %   Rating of Perceived Exertion (Exercise) 13   Perceived Dyspnea (Exercise) 1   Duration Progress to 45 minutes of aerobic exercise without signs/symptoms of physical distress   Intensity THRR unchanged     Progression   Progression Continue to progress workloads to maintain intensity without signs/symptoms of physical distress.     Resistance Training   Training Prescription Yes   Weight orange bands   Reps 10-15   Time 10 Minutes     Oxygen   Oxygen  Continuous   Liters 6     Recumbant Bike   Level 3   Minutes 17     NuStep   Level 3   Minutes 17   METs 2.7     Track   Minutes 17      History  Smoking Status  . Former Smoker  . Packs/day: 1.50  . Years: 40.00  . Types: Cigarettes  . Quit date: 03/18/2017  Smokeless Tobacco  . Never Used    Goals Met:  Exercise tolerated well No report of cardiac concerns or symptoms Strength training completed today  Goals Unmet:  Not Applicable  Comments: Service time is from 1030 to 1200    Dr. Rush Farmer is Medical Director for Pulmonary Rehab at [facility].

## 2017-08-11 ENCOUNTER — Encounter (HOSPITAL_COMMUNITY)
Admission: RE | Admit: 2017-08-11 | Discharge: 2017-08-11 | Disposition: A | Discharge status: Home / Self Care | Payer: Medicare Other | Source: Ambulatory Visit | Attending: Internal Medicine | Admitting: Internal Medicine

## 2017-08-11 VITALS — Wt 119.7 lb

## 2017-08-11 DIAGNOSIS — I272 Pulmonary hypertension, unspecified: Secondary | ICD-10-CM | POA: Diagnosis not present

## 2017-08-11 NOTE — Progress Notes (Signed)
Daily Session Note  Patient Details  Name: Kayla Mack MRN: [id number] Date of Birth: [birth year] Referring Provider:     Pulmonary Rehab Walk Test from 07/07/2017 in [place]  Referring Provider  Dr. Lake Bells      Encounter Date: 08/11/2017  Check In:     Session Check In - 08/11/17 1055      Check-In   [facility]   Staff Present Trish Fountain, RN, BSN;Lisa Ysidro Evert, RN;Molly diVincenzo, MS, ACSM RCEP, Exercise Physiologist;Sarinah Doetsch Leonia Reeves, RN, BSN   Supervising physician immediately available to respond to emergencies [facility] immediately available   Physician(s) Dr. Wendee Beavers   Medication changes reported     No   Fall or balance concerns reported    No   Tobacco Cessation No Change   Warm-up and Cool-down Performed as group-led instruction   Resistance Training Performed Yes   VAD Patient? No     Pain Assessment   Currently in Pain? No/denies   Multiple Pain Sites No      Capillary Blood Glucose: No results found for this or any previous visit (from the past 24 hour(s)).      Exercise Prescription Changes - 08/11/17 1200      Response to Exercise   Blood Pressure (Admit) 130/70   Blood Pressure (Exercise) 142/78   Blood Pressure (Exit) 118/70   Heart Rate (Admit) 68 bpm   Heart Rate (Exercise) 94 bpm   Heart Rate (Exit) 70 bpm   Oxygen Saturation (Admit) 93 %   Oxygen Saturation (Exercise) 88 %   Oxygen Saturation (Exit) 96 %   Rating of Perceived Exertion (Exercise) 13   Perceived Dyspnea (Exercise) 3   Duration Progress to 45 minutes of aerobic exercise without signs/symptoms of physical distress   Intensity THRR unchanged     Progression   Progression Continue to progress workloads to maintain intensity without signs/symptoms of physical distress.     Resistance Training   Training Prescription Yes   Weight orange bands   Reps 10-15   Time 10 Minutes     Oxygen   Oxygen Continuous    Liters 6     Recumbant Bike   Level 3   Minutes 17     Track   Laps 11   Minutes 17      History  Smoking Status  . Former Smoker  . Packs/day: 1.50  . Years: 40.00  . Types: Cigarettes  . Quit date: 03/18/2017  Smokeless Tobacco  . Never Used    Goals Met:  Proper associated with RPD/PD & O2 Sat Strength training completed today  Goals Unmet:  Not Applicable  Comments: Service time is from 1030 to [place]    Dr. Rush Farmer is Medical Director for Pulmonary Rehab at [facility].

## 2017-08-16 ENCOUNTER — Encounter (HOSPITAL_COMMUNITY)
Admission: RE | Admit: 2017-08-16 | Discharge: 2017-08-16 | Disposition: A | Discharge status: Home / Self Care | Payer: Medicare Other | Source: Ambulatory Visit | Attending: Internal Medicine | Admitting: Internal Medicine

## 2017-08-16 VITALS — Wt 121.9 lb

## 2017-08-16 DIAGNOSIS — I272 Pulmonary hypertension, unspecified: Secondary | ICD-10-CM | POA: Diagnosis not present

## 2017-08-16 NOTE — Progress Notes (Signed)
Daily Session Note  Patient Details  Name: Kayla Mack MRN: [id number] Date of Birth: September 28, 1946 Referring Provider:     Pulmonary Rehab Walk Test from 07/07/2017 in [place]  Referring Provider  Dr. Lake Bells      Encounter Date: 08/16/2017  Check In:     Session Check In - 08/16/17 1030      Check-In   [facility]   Staff Present Rosebud Poles, RN, BSN;Molly diVincenzo, MS, ACSM RCEP, Exercise Physiologist;Lisa Ysidro Evert, RN;Portia Rollene Rotunda, RN, BSN   Supervising physician immediately available to respond to emergencies [facility] immediately available   Physician(s) Dr. Wendee Beavers   Medication changes reported     No   Fall or balance concerns reported    No   Tobacco Cessation No Change   Warm-up and Cool-down Performed as group-led instruction   Resistance Training Performed Yes   VAD Patient? No     Pain Assessment   Currently in Pain? No/denies   Multiple Pain Sites No      Capillary Blood Glucose: No results found for this or any previous visit (from the past 24 hour(s)).      Exercise Prescription Changes - 08/16/17 1200      Response to Exercise   Blood Pressure (Admit) 140/74   Blood Pressure (Exercise) 140/60   Blood Pressure (Exit) 126/64   Heart Rate (Admit) 68 bpm   Heart Rate (Exercise) 92 bpm   Heart Rate (Exit) 73 bpm   Oxygen Saturation (Admit) 94 %   Oxygen Saturation (Exercise) 89 %   Oxygen Saturation (Exit) 96 %   Rating of Perceived Exertion (Exercise) 13   Perceived Dyspnea (Exercise) 3   Duration Progress to 45 minutes of aerobic exercise without signs/symptoms of physical distress   Intensity THRR unchanged     Progression   Progression Continue to progress workloads to maintain intensity without signs/symptoms of physical distress.     Resistance Training   Training Prescription Yes   Weight orange bands   Reps 10-15   Time 10 Minutes     Interval Training   Interval Training No     Oxygen   Oxygen Continuous   Liters 6-8     Recumbant Bike   Level 3   Minutes 17     NuStep   Level 3   Minutes 17   METs 2.8     Track   Laps 12   Minutes 17      History  Smoking Status  . Former Smoker  . Packs/day: 1.50  . Years: 40.00  . Types: Cigarettes  . Quit date: 03/18/2017  Smokeless Tobacco  . Never Used    Goals Met:  Proper associated with RPD/PD & O2 Sat Strength training completed today  Goals Unmet:  Not Applicable  Comments: Service time is from 1030 to 1200    Dr. Rush Farmer is Medical Director for Pulmonary Rehab at [facility].

## 2017-08-18 ENCOUNTER — Encounter (HOSPITAL_COMMUNITY)
Admission: RE | Admit: 2017-08-18 | Discharge: 2017-08-18 | Disposition: A | Discharge status: Home / Self Care | Payer: Medicare Other | Source: Ambulatory Visit | Attending: Internal Medicine | Admitting: Internal Medicine

## 2017-08-18 ENCOUNTER — Other Ambulatory Visit: Payer: Self-pay

## 2017-08-18 VITALS — Wt 122.1 lb

## 2017-08-18 DIAGNOSIS — F329 Major depressive disorder, single episode, unspecified: Secondary | ICD-10-CM | POA: Diagnosis not present

## 2017-08-18 DIAGNOSIS — J449 Chronic obstructive pulmonary disease, unspecified: Secondary | ICD-10-CM

## 2017-08-18 DIAGNOSIS — I272 Pulmonary hypertension, unspecified: Secondary | ICD-10-CM | POA: Insufficient documentation

## 2017-08-18 DIAGNOSIS — F4323 Adjustment disorder with mixed anxiety and depressed mood: Secondary | ICD-10-CM | POA: Diagnosis not present

## 2017-08-18 NOTE — Progress Notes (Signed)
Daily Session Note  Patient Details  Name: Kayla Mack MRN: [id number] Date of Birth: [birth year] Referring Provider:     Pulmonary Rehab Walk Test from 07/07/2017 in [place]  Referring Provider  Dr. Lake Bells      Encounter Date: 08/18/2017  Check In:     Session Check In - 08/18/17 1030      Check-In   [facility]   Staff Present Rosebud Poles, RN, BSN;Pamelyn Bancroft, MS, ACSM RCEP, Exercise Physiologist;Lisa Ysidro Evert, RN;Portia Rollene Rotunda, RN, BSN   Supervising physician immediately available to respond to emergencies [facility] immediately available   Physician(s) Dr. Wynetta Emery   Medication changes reported     No   Fall or balance concerns reported    No   Tobacco Cessation No Change   Warm-up and Cool-down Performed as group-led instruction   Resistance Training Performed Yes   VAD Patient? No     Pain Assessment   Currently in Pain? No/denies   Multiple Pain Sites No      Capillary Blood Glucose: No results found for this or any previous visit (from the past 24 hour(s)).      Exercise Prescription Changes - 08/18/17 1200      Response to Exercise   Blood Pressure (Admit) 110/60   Blood Pressure (Exercise) 150/70   Blood Pressure (Exit) 126/66   Heart Rate (Admit) 79 bpm   Heart Rate (Exercise) 77 bpm   Heart Rate (Exit) 71 bpm   Oxygen Saturation (Admit) 94 %   Oxygen Saturation (Exercise) 92 %   Oxygen Saturation (Exit) 97 %   Rating of Perceived Exertion (Exercise) 11   Perceived Dyspnea (Exercise) 2   Duration Progress to 45 minutes of aerobic exercise without signs/symptoms of physical distress   Intensity THRR unchanged     Progression   Progression Continue to progress workloads to maintain intensity without signs/symptoms of physical distress.     Resistance Training   Training Prescription Yes   Weight orange bands   Reps 10-15   Time 10 Minutes     Interval Training   Interval Training No     Oxygen   Oxygen Continuous   Liters 6-8     Recumbant Bike   Level 4   Minutes 17     Track   Laps 8   Minutes 17      History  Smoking Status  . Former Smoker  . Packs/day: 1.50  . Years: 40.00  . Types: Cigarettes  . Quit date: 03/18/2017  Smokeless Tobacco  . Never Used    Goals Met:  Exercise tolerated well No report of cardiac concerns or symptoms Strength training completed today  Goals Unmet:  Not Applicable  Comments: Service time is from 10:30a to 12:30p    Dr. Rush Farmer is Medical Director for Pulmonary Rehab at [facility].

## 2017-08-23 ENCOUNTER — Encounter (HOSPITAL_COMMUNITY)
Admission: RE | Admit: 2017-08-23 | Discharge: 2017-08-23 | Disposition: A | Discharge status: Home / Self Care | Payer: Medicare Other | Source: Ambulatory Visit | Attending: Internal Medicine | Admitting: Internal Medicine

## 2017-08-23 VITALS — Wt 121.5 lb

## 2017-08-23 DIAGNOSIS — I272 Pulmonary hypertension, unspecified: Secondary | ICD-10-CM | POA: Diagnosis not present

## 2017-08-23 NOTE — Addendum Note (Signed)
Encounter addended by: Gaspar Bidding on: 08/23/2017 12:58 PM  Actions taken: Flowsheet accepted, Visit Navigator Flowsheet section accepted, Sign clinical note

## 2017-08-23 NOTE — Progress Notes (Signed)
I have reviewed a Home Exercise Prescription with Kayla Mack . Kayla Mack is currently exercising at home.  The patient was advised to walk 2-3 days a week for 30-45 minutes.  Kayla Mack and I discussed how to progress their exercise prescription.  The patient stated that their goals were to live longer, learn how to adjust to disease process, stay positive.  The patient stated that they understand the exercise prescription.  We reviewed exercise guidelines, target heart rate during exercise, oxygen use, weather, home pulse oximeter, endpoints for exercise, and goals.  Patient is encouraged to come to me with any questions. I will continue to follow up with the patient to assist them with progression and safety.

## 2017-08-23 NOTE — Progress Notes (Signed)
Daily Session Note  Patient Details  Name: Kayla Mack MRN: [id number] Date of Birth: [birth year] Referring Provider:     Pulmonary Rehab Walk Test from 07/07/2017 in [place]  Referring Provider  Dr. Lake Bells      Encounter Date: 08/23/2017  Check In: Session Check In - 08/23/17 1226      Check-In   Location  [facility]       Capillary Blood Glucose: No results found for this or any previous visit (from the past 24 hour(s)).  Exercise Prescription Changes - 08/23/17 1200      Response to Exercise   Blood Pressure (Admit)  120/64    Blood Pressure (Exercise)  110/64    Blood Pressure (Exit)  120/60    Heart Rate (Admit)  70 bpm    Heart Rate (Exercise)  98 bpm    Heart Rate (Exit)  66 bpm    Oxygen Saturation (Admit)  99 %    Oxygen Saturation (Exercise)  91 %    Oxygen Saturation (Exit)  93 %    Rating of Perceived Exertion (Exercise)  15    Perceived Dyspnea (Exercise)  2    Duration  Progress to 45 minutes of aerobic exercise without signs/symptoms of physical distress    Intensity  THRR unchanged      Progression   Progression  Continue to progress workloads to maintain intensity without signs/symptoms of physical distress.      Resistance Training   Training Prescription  Yes    Weight  orange bands    Reps  10-15    Time  10 Minutes      Interval Training   Interval Training  No      Oxygen   Oxygen  Continuous    Liters  6-8      Recumbant Bike   Level  4    Minutes  34      NuStep   Level  4    Minutes  17    METs  3.3       Social History   Tobacco Use  Smoking Status Former Smoker  . Packs/day: 1.50  . Years: 40.00  . Pack years: 60.00  . Types: Cigarettes  . Last attempt to quit: 03/18/2017  . Years since quitting: 0.4  Smokeless Tobacco Never Used    Goals Met:  Exercise tolerated well No report of cardiac concerns or symptoms Strength training completed today  Goals  Unmet:  Not Applicable  Comments: Service time is from 10:30a to 12:15p    Dr. Rush Farmer is Medical Director for Pulmonary Rehab at [facility].

## 2017-08-25 ENCOUNTER — Telehealth: Payer: Self-pay | Admitting: Emergency Medicine

## 2017-08-25 ENCOUNTER — Encounter (HOSPITAL_COMMUNITY)
Admission: RE | Admit: 2017-08-25 | Discharge: 2017-08-25 | Disposition: A | Discharge status: Home / Self Care | Payer: Medicare Other | Source: Ambulatory Visit | Attending: Internal Medicine | Admitting: Internal Medicine

## 2017-08-25 DIAGNOSIS — I272 Pulmonary hypertension, unspecified: Secondary | ICD-10-CM | POA: Diagnosis not present

## 2017-08-25 DIAGNOSIS — J449 Chronic obstructive pulmonary disease, unspecified: Secondary | ICD-10-CM

## 2017-08-25 NOTE — Telephone Encounter (Signed)
Spoke with Truddie Crumble, she states the pt has a Conservation officer, nature but its not a home filled concentrator which makes it impossible for her to fill her tanks. Truddie Crumble states she needs multiple big tanks with carriers. She is on 8L with exertion in rehab class. [facility] supposedly gave her the small tanks and she is having to go to [facility] everyday to fill up her tanks. She does not qualify for a POC. RB can we place an order to have them bring her a big concentrator for her home? Please advise.

## 2017-08-25 NOTE — Progress Notes (Signed)
Daily Session Note  Patient Details  Name: Kayla Mack MRN: [id number] Date of Birth: 03/26/1946 Referring Provider:     Pulmonary Rehab Walk Test from 07/07/2017 in [place]  Referring Provider  Dr. Lake Bells      Encounter Date: 08/25/2017  Check In:   Capillary Blood Glucose: No results found for this or any previous visit (from the past 24 hour(s)).  Exercise Prescription Changes - 08/25/17 1300      Response to Exercise   Blood Pressure (Admit)  110/52    Blood Pressure (Exercise)  120/60    Blood Pressure (Exit)  118/70    Heart Rate (Admit)  75 bpm    Heart Rate (Exercise)  74 bpm    Heart Rate (Exit)  72 bpm    Oxygen Saturation (Admit)  92 %    Oxygen Saturation (Exercise)  95 %    Oxygen Saturation (Exit)  92 %    Rating of Perceived Exertion (Exercise)  15    Perceived Dyspnea (Exercise)  3    Duration  Progress to 45 minutes of aerobic exercise without signs/symptoms of physical distress    Intensity  THRR unchanged      Progression   Progression  Continue to progress workloads to maintain intensity without signs/symptoms of physical distress.      Resistance Training   Training Prescription  Yes    Weight  orange bands    Reps  10-15    Time  10 Minutes      Interval Training   Interval Training  No      Oxygen   Oxygen  Continuous    Liters  6-8      Recumbant Bike   Level  4    Minutes  17      Track   Laps  10    Minutes  17       Social History   Tobacco Use  Smoking Status Former Smoker  . Packs/day: 1.50  . Years: 40.00  . Pack years: 60.00  . Types: Cigarettes  . Last attempt to quit: 03/18/2017  . Years since quitting: 0.4  Smokeless Tobacco Never Used    Goals Met:  Exercise tolerated well No report of cardiac concerns or symptoms Strength training completed today  Goals Unmet:  Not Applicable  Comments: Service time is from 10:30a to 12:30p    Dr. Rush Farmer is Medical  Director for Pulmonary Rehab at [facility].

## 2017-08-29 ENCOUNTER — Other Ambulatory Visit: Payer: Self-pay

## 2017-08-29 NOTE — Patient Outreach (Signed)
[facility]) Care Management  08/29/2017   Kayla Mack 1946-02-18 [id number]  Subjective: Telephone call to patient for monthly outreach.  HIPAA verified. The patient states that she is feeling well. She is not having any pain today.  She is adherent with her medications. She is enjoying going to pulmonary rehab.  She was having a slight problem not having enough oxygen at her sessions and has switched to bigger tanks. The patient would like to incorporate more exercise into her routine by joining silver sneakers.  The patient states that she is socializing more but finds that she has some anxiety around others.  She feels that her breathing is loud and others can hear her.  She states that she finds herself holding her breath which will cause her to need more air. RN Health coach will send educational material for anxiety.  Objective:   Current Medications:  Current Outpatient Medications  Medication Sig Dispense Refill  . acetaminophen (TYLENOL) 500 MG tablet Take 1,000 mg by mouth 2 (two) times daily as needed for pain.     . Albuterol Sulfate (VENTOLIN HFA IN) Inhale 2 puffs into the lungs 4 (four) times daily as needed.    [name] ELLIPTA 62.5-25 MCG/INH AEPB Inhale 1 puff into the lungs daily.    . bisoprolol (ZEBETA) 5 MG tablet Take 1 tablet (5 mg total) by mouth daily. 30 tablet 0  . buPROPion (WELLBUTRIN SR) 150 MG 12 hr tablet Take 150 mg by mouth daily.    . calcium-vitamin D (OSCAL WITH D) 500-200 MG-UNIT per tablet Take 1 tablet by mouth 2 (two) times daily.    . clonazePAM (KLONOPIN) 0.5 MG tablet Take 1 tablet (0.5 mg total) by mouth 2 (two) times daily as needed for anxiety. (Patient taking differently: Take 0.5 mg by mouth daily as needed for anxiety. ) 10 tablet 0  . clopidogrel (PLAVIX) 75 MG tablet Take 1 tablet (75 mg total) by mouth daily. 30 tablet 0  . lovastatin (MEVACOR) 40 MG tablet Take 40 mg by mouth every morning.    [name] PARoxetine (PAXIL)  20 MG tablet Take 20 mg by mouth daily.    . Vitamin D, Ergocalciferol, (DRISDOL) 50000 units CAPS capsule Take 50,000 Units by mouth every 7 (seven) days.    . feeding supplement, ENSURE ENLIVE, (ENSURE ENLIVE) LIQD Take 237 mLs by mouth 2 (two) times daily between meals. (Patient not taking: Reported on 08/29/2017) 237 mL 12  . fluticasone (FLOVENT HFA) 110 MCG/ACT inhaler Inhale 2 puffs into the lungs 2 (two) times daily. (Patient not taking: Reported on 07/27/2017) 1 Inhaler 0  . nicotine (NICODERM CQ - DOSED IN MG/24 HOURS) 21 mg/24hr patch Place 21 mg onto the skin daily.    . VENTOLIN HFA 108 (90 Base) MCG/ACT inhaler      No current facility-administered medications for this visit.     Functional Status:  In your present state of health, do you have any difficulty performing the following activities: 05/19/2017 05/04/2017  Hearing? N N  Vision? N N  Difficulty concentrating or making decisions? N N  Walking or climbing stairs? N N  Dressing or bathing? N N  Doing errands, shopping? Y N  Preparing Food and eating ? N -  Using the Toilet? N -  In the past six months, have you accidently leaked urine? N -  Do you have problems with loss of bowel control? N -  Managing your Medications? N -  Managing  your Finances? N -  Housekeeping or managing your Housekeeping? Y -  Some recent data might be hidden    Fall/Depression Screening: Fall Risk  08/29/2017 07/27/2017 07/04/2017  Falls in the past year? No No No  Risk for fall due to : - - -   PHQ 2/9 Scores 08/29/2017 07/27/2017 07/04/2017 07/01/2017 05/19/2017 05/16/2017  PHQ - 2 Score _0 PHQ- 9 Score 3 5 - _1 Assessment: Patient continues to benefit from health coach outreach for disease management and support.   THN CM Care Plan Problem One     Most Recent Value  Care Plan for Problem One  Active  THN Long Term Goal   Patient will be able to explain COPD zones on action plan within the next 90 days   THN Long  Term Goal Start Date  07/04/17  Interventions for Problem One Long Term Goal  RN Health Coach dicussed COPD Action plan with the patient  THN CM Short Term Goal #1   In 30 days the patient will be able to express one way of coping with anxiety with her breathing.  THN CM Short Term Goal #1 Start Date  08/29/17  [facility] CM Short Term Goal #1 Met Date  08/29/17  Interventions for Short Term Goal #1  RN Health Coach will send the patient educational material to review for dicussion at the next phone call  THN CM Short Term Goal #2   In 30 days the patient will be able to verbalize starting silver sneakers program that she is looking into.  THN CM Short Term Goal #2 Start Date  08/29/17  [facility] CM Short Term Goal #2 Met Date  07/04/17  Interventions for Short Term Goal #2  RN Health Coach dicussed the benefits of adding exercise into her routine and to be mindful not over exert herself.      Plan: RN Health Coach will contact patient in the month of December and patient agrees to next outreach.  Lazaro Arms RN, BSN, [place] Direct Dial:  [phone] Fax: [phone]    .

## 2017-08-30 ENCOUNTER — Encounter (HOSPITAL_COMMUNITY)
Admission: RE | Admit: 2017-08-30 | Discharge: 2017-08-30 | Disposition: A | Discharge status: Home / Self Care | Payer: Medicare Other | Source: Ambulatory Visit | Attending: Internal Medicine | Admitting: Internal Medicine

## 2017-08-30 VITALS — Wt 122.1 lb

## 2017-08-30 DIAGNOSIS — F4323 Adjustment disorder with mixed anxiety and depressed mood: Secondary | ICD-10-CM | POA: Diagnosis not present

## 2017-08-30 DIAGNOSIS — F329 Major depressive disorder, single episode, unspecified: Secondary | ICD-10-CM | POA: Diagnosis not present

## 2017-08-30 DIAGNOSIS — I272 Pulmonary hypertension, unspecified: Secondary | ICD-10-CM

## 2017-08-30 NOTE — Progress Notes (Signed)
Daily Session Note  Patient Details  Name: Kayla Mack MRN: [id number] Date of Birth: [birth year] Referring Provider:     Pulmonary Rehab Walk Test from 07/07/2017 in [place]  Referring Provider  Dr. Lake Bells      Encounter Date: 08/30/2017  Check In: Session Check In - 08/30/17 1229      Check-In   Location  [facility]    Staff Present  Rosebud Poles, RN, BSN;Molly diVincenzo, MS, ACSM RCEP, Exercise Physiologist;Deedee Lybarger Ysidro Evert, RN;Portia Rollene Rotunda, RN, BSN    Supervising physician immediately available to respond to emergencies  [facility] immediately available    Physician(s)  Dr. Maylene Roes    Medication changes reported      No    Fall or balance concerns reported     No    Tobacco Cessation  No Change    Warm-up and Cool-down  Performed as group-led instruction    Resistance Training Performed  Yes    VAD Patient?  No      Pain Assessment   Currently in Pain?  No/denies    Multiple Pain Sites  No       Capillary Blood Glucose: No results found for this or any previous visit (from the past 24 hour(s)).  Exercise Prescription Changes - 08/30/17 1200      Response to Exercise   Blood Pressure (Admit)  120/60    Blood Pressure (Exercise)  161/66    Blood Pressure (Exit)  110/70    Heart Rate (Admit)  71 bpm    Heart Rate (Exercise)  119 bpm    Heart Rate (Exit)  66 bpm    Oxygen Saturation (Admit)  95 %    Oxygen Saturation (Exercise)  84 % oxygen increased to 8L sat improved to 91   oxygen increased to 8L sat improved to 91   Oxygen Saturation (Exit)  98 %    Rating of Perceived Exertion (Exercise)  15    Perceived Dyspnea (Exercise)  3    Duration  Progress to 45 minutes of aerobic exercise without signs/symptoms of physical distress    Intensity  THRR unchanged      Progression   Progression  Continue to progress workloads to maintain intensity without signs/symptoms of physical distress.      Resistance  Training   Training Prescription  Yes    Weight  orange bands    Reps  10-15    Time  10 Minutes      Interval Training   Interval Training  No      Oxygen   Oxygen  Continuous    Liters  6-8      Recumbant Bike   Level  3    Minutes  17      NuStep   Level  4    Minutes  17    METs  3.9      Track   Laps  10    Minutes  177       Social History   Tobacco Use  Smoking Status Former Smoker  . Packs/day: 1.50  . Years: 40.00  . Pack years: 60.00  . Types: Cigarettes  . Last attempt to quit: 03/18/2017  . Years since quitting: 0.4  Smokeless Tobacco Never Used    Goals Met:  Exercise tolerated well No report of cardiac concerns or symptoms Strength training completed today  Goals Unmet:  Not Applicable  Comments: Service time is from  1030 to 1215    Dr. Rush Farmer is Medical Director for Pulmonary Rehab at [facility].

## 2017-08-31 NOTE — Telephone Encounter (Signed)
Order has been placed for home concentrator. Nothing further was needed.

## 2017-09-01 ENCOUNTER — Encounter (HOSPITAL_COMMUNITY): Payer: Medicare Other

## 2017-09-01 NOTE — Progress Notes (Signed)
Pulmonary Individual Treatment Plan  Patient Details  Name: Kayla Mack MRN: [id number] Date of Birth: 11-05-1945 Referring Provider:     Pulmonary Rehab Walk Test from 07/07/2017 in [place] [facility]  Referring Provider  Dr. Lake Bells      Initial Encounter Date:    Pulmonary Rehab Walk Test from 07/07/2017 in [facility]  Date  07/12/17  Referring Provider  Dr. Lake Bells      Visit Diagnosis: Pulmonary hypertension ([place])  Patient's Home Medications on Admission:   Current Outpatient Medications:  .  acetaminophen (TYLENOL) 500 MG tablet, Take 1,000 mg by mouth 2 (two) times daily as needed for pain. , Disp: , Rfl:  .  Albuterol Sulfate (VENTOLIN HFA IN), Inhale 2 puffs into the lungs 4 (four) times daily as needed., Disp: , Rfl:  .  ANORO ELLIPTA 62.5-25 MCG/INH AEPB, Inhale 1 puff into the lungs daily., Disp: , Rfl:  .  bisoprolol (ZEBETA) 5 MG tablet, Take 1 tablet (5 mg total) by mouth daily., Disp: 30 tablet, Rfl: 0 .  buPROPion (WELLBUTRIN SR) 150 MG 12 hr tablet, Take 150 mg by mouth daily., Disp: , Rfl:  .  calcium-vitamin D (OSCAL WITH D) 500-200 MG-UNIT per tablet, Take 1 tablet by mouth 2 (two) times daily., Disp: , Rfl:  .  clonazePAM (KLONOPIN) 0.5 MG tablet, Take 1 tablet (0.5 mg total) by mouth 2 (two) times daily as needed for anxiety. (Patient taking differently: Take 0.5 mg by mouth daily as needed for anxiety. ), Disp: 10 tablet, Rfl: 0 .  clopidogrel (PLAVIX) 75 MG tablet, Take 1 tablet (75 mg total) by mouth daily., Disp: 30 tablet, Rfl: 0 .  feeding supplement, ENSURE ENLIVE, (ENSURE ENLIVE) LIQD, Take 237 mLs by mouth 2 (two) times daily between meals. (Patient not taking: Reported on 08/29/2017), Disp: 237 mL, Rfl: 12 .  fluticasone (FLOVENT HFA) 110 MCG/ACT inhaler, Inhale 2 puffs into the lungs 2 (two) times daily. (Patient not taking: Reported on 07/27/2017), Disp: 1 Inhaler, Rfl: 0 .  lovastatin  (MEVACOR) 40 MG tablet, Take 40 mg by mouth every morning., Disp: , Rfl:  .  nicotine (NICODERM CQ - DOSED IN MG/24 HOURS) 21 mg/24hr patch, Place 21 mg onto the skin daily., Disp: , Rfl:  .  PARoxetine (PAXIL) 20 MG tablet, Take 20 mg by mouth daily., Disp: , Rfl:  .  VENTOLIN HFA 108 (90 Base) MCG/ACT inhaler, , Disp: , Rfl:  .  Vitamin D, Ergocalciferol, (DRISDOL) 50000 units CAPS capsule, Take 50,000 Units by mouth every 7 (seven) days., Disp: , Rfl:   Past Medical History: Past Medical History:  Diagnosis Date  . Anxiety   . COPD (chronic obstructive pulmonary disease) ([place])   . Depression   . Hyperlipidemia   . Hypertension     Tobacco Use: Social History   Tobacco Use  Smoking Status Former Smoker  . Packs/day: 1.50  . Years: 40.00  . Pack years: 60.00  . Types: Cigarettes  . Last attempt to quit: 03/18/2017  . Years since quitting: 0.4  Smokeless Tobacco Never Used    Labs: Recent Chemical engineer    Labs for ITP Cardiac and Pulmonary Rehab Latest Ref Rng & Units 05/04/2017 05/05/2017 05/07/2017 05/09/2017 05/09/2017   Cholestrol 0 - 200 mg/dL 118 - - - -   LDLCALC 0 - 99 mg/dL 72 - - - -   HDL >40 mg/dL 31(L) - - - -  Trlycerides <150 mg/dL 76 - - - -   Hemoglobin A1c 4.8 - 5.6 % 5.8(H) - - - -   PHART 7.350 - 7.450 - 7.249(L) 7.311(L) 7.423 -   PCO2ART 32.0 - 48.0 mmHg - 53.9(H) 59.3(H) 56.6(H) -   HCO3 20.0 - 28.0 mmol/L 26.4 22.8 29.0(H) 36.9(H) 39.0(H)   TCO2 0 - 100 mmol/L 28 - - 39 41   ACIDBASEDEF 0.0 - 2.0 mmol/L - 3.4(H) - - -   O2SAT % 84.0 98.8 98.2 90.0 65.0      Capillary Blood Glucose: Lab Results  Component Value Date   GLUCAP 148 (H) 05/06/2017     Pulmonary Assessment Scores: Pulmonary Assessment Scores    Row Name 07/13/17 1405         ADL UCSD   ADL Phase  Entry     SOB Score total  55       CAT Score   CAT Score  23 Entry        Pulmonary Function Assessment: Pulmonary Function Assessment - 07/01/17 1055      Breath    Bilateral Breath Sounds  Clear;Decreased    Shortness of Breath  Yes;Limiting activity       Exercise Target Goals:    Exercise Program Goal: Individual exercise prescription set with THRR, safety & activity barriers. Participant demonstrates ability to understand and report RPE using BORG scale, to self-measure pulse accurately, and to acknowledge the importance of the exercise prescription.  Exercise Prescription Goal: Starting with aerobic activity 30 plus minutes a day, 3 days per week for initial exercise prescription. Provide home exercise prescription and guidelines that participant acknowledges understanding prior to discharge.  Activity Barriers & Risk Stratification: Activity Barriers & Cardiac Risk Stratification - 07/01/17 1034      Activity Barriers & Cardiac Risk Stratification   Activity Barriers  Deconditioning;Muscular Weakness;Shortness of Breath       6 Minute Walk: 6 Minute Walk    Row Name 07/12/17 0735         6 Minute Walk   Phase  Initial     Distance  1200 feet     Walk Time  6 minutes     # of Rest Breaks  0     MPH  2.27     METS  2.76     RPE  11     Perceived Dyspnea   1     Symptoms  No     Resting HR  77 bpm     Resting BP  149/80     Resting Oxygen Saturation   86 % 2 liters/88% on 4 liters     Exercise Oxygen Saturation  during 6 min walk  85 %     Max Ex. HR  100 bpm     Max Ex. BP  163/75     2 Minute Post BP  174/82       Interval HR   1 Minute HR  77     2 Minute HR  78     3 Minute HR  95     4 Minute HR  100     5 Minute HR  95     6 Minute HR  93     2 Minute Post HR  74     Interval Heart Rate?  Yes       Interval Oxygen   Interval Oxygen?  Yes     Baseline Oxygen Saturation %  86 %     1 Minute Oxygen Saturation %  94 %     1 Minute Liters of Oxygen  4 L     2 Minute Oxygen Saturation %  91 %     2 Minute Liters of Oxygen  4 L     3 Minute Oxygen Saturation %  87 %     3 Minute Liters of Oxygen  4 L     4  Minute Oxygen Saturation %  85 %     4 Minute Liters of Oxygen  6 L     5 Minute Oxygen Saturation %  86 %     5 Minute Liters of Oxygen  6 L     6 Minute Oxygen Saturation %  88 %     6 Minute Liters of Oxygen  6 L     2 Minute Post Oxygen Saturation %  97 %     2 Minute Post Liters of Oxygen  6 L        Oxygen Initial Assessment: Oxygen Initial Assessment - 07/12/17 0742      Initial 6 min Walk   Oxygen Used  Continuous;E-Tanks started on 2 liters and titrated up    Liters per minute  6      Program Oxygen Prescription   Program Oxygen Prescription  Continuous;E-Tanks    Liters per minute  6       Oxygen Re-Evaluation: Oxygen Re-Evaluation    Row Name 08/05/17 1213 08/29/17 1212 08/30/17 1616         Program Oxygen Prescription   Program Oxygen Prescription  Continuous;E-Tanks  Continuous;E-Tanks  -     Liters per minute  6  8  -     Comments  -  -  patient had to increase oxygen with ambulation today from 8 lpm to 10 lpm for desaturation to 86% on 8 lpm       Home Oxygen   Home Oxygen Device  E-Tanks  E-Tanks  -     Sleep Oxygen Prescription  Continuous  Continuous  -     Liters per minute  3  3  -     Home Exercise Oxygen Prescription  -  Continuous  -     Liters per minute  -  8  -     Home at Rest Exercise Oxygen Prescription  -  Continuous  -     Liters per minute  -  3  -       Goals/Expected Outcomes   Short Term Goals  To learn and exhibit compliance with exercise, home and travel O2 prescription;To learn and understand importance of monitoring SPO2 with pulse oximeter and demonstrate accurate use of the pulse oximeter.;To learn and understand importance of maintaining oxygen saturations>88%;To learn and demonstrate proper pursed lip breathing techniques or other breathing techniques.;To learn and demonstrate proper use of respiratory medications  To learn and exhibit compliance with exercise, home and travel O2 prescription;To learn and understand importance  of monitoring SPO2 with pulse oximeter and demonstrate accurate use of the pulse oximeter.;To learn and understand importance of maintaining oxygen saturations>88%;To learn and demonstrate proper pursed lip breathing techniques or other breathing techniques.;To learn and demonstrate proper use of respiratory medications  -     Long  Term Goals  Exhibits compliance with exercise, home and travel O2 prescription;Verbalizes importance of monitoring SPO2 with pulse oximeter and return demonstration;Maintenance of O2 saturations>88%;Exhibits proper breathing techniques, such as  pursed lip breathing or other method taught during program session;Compliance with respiratory medication;Demonstrates proper use of MDI's  Exhibits compliance with exercise, home and travel O2 prescription;Verbalizes importance of monitoring SPO2 with pulse oximeter and return demonstration;Maintenance of O2 saturations>88%;Exhibits proper breathing techniques, such as pursed lip breathing or other method taught during program session;Compliance with respiratory medication;Demonstrates proper use of MDI's  -     Comments  patient demonstrates correct use of home o2 by wearing her oxygen into pulmonary rehab on correct dosage with correct equiptment.   we are in the process of helping patient recieve correct oxygen supply devices for her at home needs.  -     Goals/Expected Outcomes  patient will verbalize compliance with home O2  patient will verbalize compliance with home O2  -        Oxygen Discharge (Final Oxygen Re-Evaluation): Oxygen Re-Evaluation - 08/30/17 1616      Program Oxygen Prescription   Comments  patient had to increase oxygen with ambulation today from 8 lpm to 10 lpm for desaturation to 86% on 8 lpm       Initial Exercise Prescription: Initial Exercise Prescription - 07/12/17 0700      Date of Initial Exercise RX and Referring Provider   Date  07/12/17    Referring Provider  Dr. Lake Bells      Oxygen    Oxygen  Continuous    Liters  6      Recumbant Bike   Level  2    Minutes  17      NuStep   Level  2    Minutes  17    METs  1.3      Track   Laps  5    Minutes  17      Prescription Details   Frequency (times per week)  2    Duration  Progress to 45 minutes of aerobic exercise without signs/symptoms of physical distress      Intensity   THRR 40-80% of Max Heartrate  59-118    Ratings of Perceived Exertion  11-13    Perceived Dyspnea  0-4      Progression   Progression  Continue progressive overload as per policy without signs/symptoms or physical distress.      Resistance Training   Training Prescription  Yes    Weight  orange bands    Reps  10-15       Perform Capillary Blood Glucose checks as needed.  Exercise Prescription Changes: Exercise Prescription Changes    Row Name 07/14/17 1200 07/19/17 1200 07/21/17 1200 07/26/17 1300 08/02/17 1200     Response to Exercise   Blood Pressure (Admit)  102/60  118/62  124/70  140/68  124/64   Blood Pressure (Exercise)  152/64  140/60  132/72  138/62  130/70   Blood Pressure (Exit)  108/60  106/66  112/60  118/60  106/66   Heart Rate (Admit)  72 bpm  65 bpm  75 bpm  68 bpm  63 bpm   Heart Rate (Exercise)  107 bpm  84 bpm  122 bpm  87 bpm  84 bpm   Heart Rate (Exit)  74 bpm  70 bpm  74 bpm  68 bpm  67 bpm   Oxygen Saturation (Admit)  94 %  98 %  94 %  96 %  96 %   Oxygen Saturation (Exercise)  85 % O2 increased to 6L sat improved 88%  88 %  87 %  88 %  86 %   Oxygen Saturation (Exit)  95 %  97 %  95 %  98 %  91 %   Rating of Perceived Exertion (Exercise)  _0 Perceived Dyspnea (Exercise)  _1 Duration  Progress to 45 minutes of aerobic exercise without signs/symptoms of physical distress  Progress to 45 minutes of aerobic exercise without signs/symptoms of physical distress  Progress to 45 minutes of aerobic exercise without signs/symptoms of physical distress  Progress to 45 minutes of aerobic  exercise without signs/symptoms of physical distress  Progress to 45 minutes of aerobic exercise without signs/symptoms of physical distress   Intensity  Other (comment) 40-80% of HRR  Other (comment) 40-80% of HRR  THRR unchanged  THRR unchanged  THRR unchanged     Progression   Progression  Continue to progress workloads to maintain intensity without signs/symptoms of physical distress.  Continue to progress workloads to maintain intensity without signs/symptoms of physical distress.  Continue to progress workloads to maintain intensity without signs/symptoms of physical distress.  Continue to progress workloads to maintain intensity without signs/symptoms of physical distress.  Continue to progress workloads to maintain intensity without signs/symptoms of physical distress.     Resistance Training   Training Prescription  Yes  Yes  Yes  Yes  Yes   Weight  orange bands  orange bands  orange bands  orange bands  orange bands   Reps  10-15  10-15  10-15  10-15  10-15   Time  10 Minutes  10 Minutes  10 Minutes  10 Minutes  10 Minutes     Oxygen   Oxygen  Continuous  Continuous  Continuous  Continuous  Continuous   Liters  _2 Recumbant Bike   Level  _3 Minutes  _4 NuStep   Level  _5 Minutes  _6 METs  2.2  2.2  2.4  2.9  2.7     Track   Laps  -  11  -  -  15   Minutes  -  17  -  -  17   Row Name 08/04/17 1258 08/09/17 1200 08/11/17 1200 08/16/17 1200 08/18/17 1200     Response to Exercise   Blood Pressure (Admit)  124/70  130/62  130/70  140/74  110/60   Blood Pressure (Exercise)  114/66  124/70  142/78  140/60  150/70   Blood Pressure (Exit)  134/70  120/70  118/70  126/64  126/66   Heart Rate (Admit)  72 bpm  72 bpm  68 bpm  68 bpm  79 bpm   Heart Rate (Exercise)  85 bpm  83 bpm  94 bpm  92 bpm  77 bpm   Heart Rate (Exit)  78 bpm  70 bpm  70 bpm  73 bpm  71 bpm   Oxygen Saturation (Admit)  91 %  93 %   93 %  94 %  94 %   Oxygen Saturation (Exercise)  90 %  86 %  88 %  89 %  92 %   Oxygen  Saturation (Exit)  92 %  98 %  96 %  96 %  97 %   Rating of Perceived Exertion (Exercise)  _0 Perceived Dyspnea (Exercise)  _1 Duration  Progress to 45 minutes of aerobic exercise without signs/symptoms of physical distress  Progress to 45 minutes of aerobic exercise without signs/symptoms of physical distress  Progress to 45 minutes of aerobic exercise without signs/symptoms of physical distress  Progress to 45 minutes of aerobic exercise without signs/symptoms of physical distress  Progress to 45 minutes of aerobic exercise without signs/symptoms of physical distress   Intensity  THRR unchanged  THRR unchanged  THRR unchanged  THRR unchanged  THRR unchanged     Progression   Progression  Continue to progress workloads to maintain intensity without signs/symptoms of physical distress.  Continue to progress workloads to maintain intensity without signs/symptoms of physical distress.  Continue to progress workloads to maintain intensity without signs/symptoms of physical distress.  Continue to progress workloads to maintain intensity without signs/symptoms of physical distress.  Continue to progress workloads to maintain intensity without signs/symptoms of physical distress.     Resistance Training   Training Prescription  Yes  Yes  Yes  Yes  Yes   Weight  orange bands  orange bands  orange bands  orange bands  orange bands   Reps  10-15  10-15  10-15  10-15  10-15   Time  10 Minutes  10 Minutes  10 Minutes  10 Minutes  10 Minutes     Interval Training   Interval Training  -  -  -  No  No     Oxygen   Oxygen  Continuous  Continuous  Continuous  Continuous  Continuous   Liters  _2 6-8  6-8     Recumbant Bike   Level  -  _3 Minutes  -  _4 NuStep   Level  3  3  -  3  -   Minutes  17  17  -  17  -   METs  3  2.7  -  2.8  -     Track   Laps   -  -  _5 Minutes  -  _6 Row Name 08/23/17 1200 08/25/17 1300 08/30/17 1200         Response to Exercise   Blood Pressure (Admit)  120/64  110/52  120/60     Blood Pressure (Exercise)  110/64  120/60  161/66     Blood Pressure (Exit)  120/60  118/70  110/70     Heart Rate (Admit)  70 bpm  75 bpm  71 bpm     Heart Rate (Exercise)  98 bpm  74 bpm  119 bpm     Heart Rate (Exit)  66 bpm  72 bpm  66 bpm     Oxygen Saturation (Admit)  99 %  92 %  95 %     Oxygen Saturation (Exercise)  91 %  95 %  84 % oxygen increased to 8L sat improved to 91     Oxygen Saturation (Exit)  93 %  92 %  98 %  Rating of Perceived Exertion (Exercise)  _0 Perceived Dyspnea (Exercise)  _1 Duration  Progress to 45 minutes of aerobic exercise without signs/symptoms of physical distress  Progress to 45 minutes of aerobic exercise without signs/symptoms of physical distress  Progress to 45 minutes of aerobic exercise without signs/symptoms of physical distress     Intensity  THRR unchanged  THRR unchanged  THRR unchanged       Progression   Progression  Continue to progress workloads to maintain intensity without signs/symptoms of physical distress.  Continue to progress workloads to maintain intensity without signs/symptoms of physical distress.  Continue to progress workloads to maintain intensity without signs/symptoms of physical distress.       Resistance Training   Training Prescription  Yes  Yes  Yes     Weight  orange bands  orange bands  orange bands     Reps  10-15  10-15  10-15     Time  10 Minutes  10 Minutes  10 Minutes       Interval Training   Interval Training  No  No  No       Oxygen   Oxygen  Continuous  Continuous  Continuous     Liters  6-8  6-8  6-8       Recumbant Bike   Level  _2 Minutes  34  17  17       NuStep   Level  4  -  4     Minutes  17  -  17     METs  3.3  -  3.9       Track   Laps  -  10  10     Minutes  -  17  177        Home Exercise Plan   Plans to continue exercise at  [employer] (comment)  -  -     Frequency  Add 3 additional days to program exercise sessions.  -  -        Exercise Comments: Exercise Comments    Row Name 08/23/17 1254           Exercise Comments  home exercise completed          Exercise Goals and Review: Exercise Goals    Row Name 07/01/17 1035             Exercise Goals   Increase Physical Activity  Yes       Intervention  Provide advice, education, support and counseling about physical activity/exercise needs.;Develop an individualized exercise prescription for aerobic and resistive training based on initial evaluation findings, risk stratification, comorbidities and participant's personal goals.       Expected Outcomes  Achievement of increased cardiorespiratory fitness and enhanced flexibility, muscular endurance and strength shown through measurements of functional capacity and personal statement of participant.       Increase Strength and Stamina  Yes       Intervention  Provide advice, education, support and counseling about physical activity/exercise needs.;Develop an individualized exercise prescription for aerobic and resistive training based on initial evaluation findings, risk stratification, comorbidities and participant's personal goals.       Expected Outcomes  Achievement of increased cardiorespiratory fitness and enhanced flexibility, muscular endurance and strength shown through measurements of functional capacity and personal statement of participant.  Able to understand and use rate of perceived exertion (RPE) scale  Yes       Intervention  Provide education and explanation on how to use RPE scale       Expected Outcomes  Short Term: Able to use RPE daily in rehab to express subjective intensity level;Long Term:  Able to use RPE to guide intensity level when exercising independently       Able to understand and use Dyspnea scale  Yes        Intervention  Provide education and explanation on how to use Dyspnea scale       Expected Outcomes  Short Term: Able to use Dyspnea scale daily in rehab to express subjective sense of shortness of breath during exertion;Long Term: Able to use Dyspnea scale to guide intensity level when exercising independently       Knowledge and understanding of Target Heart Rate Range (THRR)  Yes       Intervention  Provide education and explanation of THRR including how the numbers were predicted and where they are located for reference       Expected Outcomes  Short Term: Able to state/look up THRR;Long Term: Able to use THRR to govern intensity when exercising independently;Short Term: Able to use daily as guideline for intensity in rehab       Understanding of Exercise Prescription  Yes       Intervention  Provide education, explanation, and written materials on patient's individual exercise prescription       Expected Outcomes  Short Term: Able to explain program exercise prescription;Long Term: Able to explain home exercise prescription to exercise independently          Exercise Goals Re-Evaluation : Exercise Goals Re-Evaluation    Row Name 08/08/17 0720 09/01/17 1627           Exercise Goal Re-Evaluation   Exercise Goals Review  Increase Strength and Stamina;Increase Physical Activity;Able to understand and use rate of perceived exertion (RPE) scale;Able to understand and use Dyspnea scale;Knowledge and understanding of Target Heart Rate Range (THRR);Understanding of Exercise Prescription  Increase Strength and Stamina;Increase Physical Activity;Able to understand and use rate of perceived exertion (RPE) scale;Able to understand and use Dyspnea scale;Knowledge and understanding of Target Heart Rate Range (THRR);Understanding of Exercise Prescription      Comments  Patient is progressing well in program. She has a wonderful attitude and it motivated to work hard while she is here. She has walked up  to 15 laps (200 ft each) in 15 minutes.   Patient is progressing well in program. She has a wonderful attitude and it motivated to work hard while she is here. She has walked up to 15 laps (200 ft each) in 15 minutes.       Expected Outcomes  Through exercise at rehab and at home the patient will increase physical activity, strength, and decrease shortness of breath.   Through exercise at rehab and at home, patient will increase strength and stamina making ADL's easier to perform. Patient will also have a better understanding of safe exercise and what they are capable to do outside of clinical supervision.         Discharge Exercise Prescription (Final Exercise Prescription Changes): Exercise Prescription Changes - 08/30/17 1200      Response to Exercise   Blood Pressure (Admit)  120/60    Blood Pressure (Exercise)  161/66    Blood Pressure (Exit)  110/70    Heart Rate (Admit)  71 bpm    Heart Rate (Exercise)  119 bpm    Heart Rate (Exit)  66 bpm    Oxygen Saturation (Admit)  95 %    Oxygen Saturation (Exercise)  84 % oxygen increased to 8L sat improved to 91    Oxygen Saturation (Exit)  98 %    Rating of Perceived Exertion (Exercise)  15    Perceived Dyspnea (Exercise)  3    Duration  Progress to 45 minutes of aerobic exercise without signs/symptoms of physical distress    Intensity  THRR unchanged      Progression   Progression  Continue to progress workloads to maintain intensity without signs/symptoms of physical distress.      Resistance Training   Training Prescription  Yes    Weight  orange bands    Reps  10-15    Time  10 Minutes      Interval Training   Interval Training  No      Oxygen   Oxygen  Continuous    Liters  6-8      Recumbant Bike   Level  3    Minutes  17      NuStep   Level  4    Minutes  17    METs  3.9      Track   Laps  10    Minutes  177       Nutrition:  Target Goals: Understanding of nutrition guidelines, daily intake of sodium  <1571m, cholesterol <2086m calories 30% from fat and 7% or less from saturated fats, daily to have 5 or more servings of fruits and vegetables.  Biometrics: Pre Biometrics - 07/01/17 1134      Pre Biometrics   Grip Strength  15 kg        Nutrition Therapy Plan and Nutrition Goals: Nutrition Therapy & Goals - 07/04/17 1212      Nutrition Therapy   Diet  Therapeutic Lifestyle Changes      Personal Nutrition Goals   Nutrition Goal  The pt will recognize symptoms that can interfere with adequate oral intake, such as shortness of breath, N/V, early satiety, fatigue, ability to secure and prepare food, taste and smell changes, chewing/swallowing difficulties, and/ or pain when eating.      Intervention Plan   Intervention  Prescribe, educate and counsel regarding individualized specific dietary modifications aiming towards targeted core components such as weight, hypertension, lipid management, diabetes, heart failure and other comorbidities.    Expected Outcomes  Short Term Goal: Understand basic principles of dietary content, such as calories, fat, sodium, cholesterol and nutrients.;Long Term Goal: Adherence to prescribed nutrition plan.       Nutrition Discharge: Rate Your Plate Scores: Nutrition Assessments - 07/04/17 1212      Rate Your Plate Scores   Pre Score  -- returned form; not completed       Nutrition Goals Re-Evaluation:   Nutrition Goals Discharge (Final Nutrition Goals Re-Evaluation):   Psychosocial: Target Goals: Acknowledge presence or absence of significant depression and/or stress, maximize coping skills, provide positive support system. Participant is able to verbalize types and ability to use techniques and skills needed for reducing stress and depression.  Initial Review & Psychosocial Screening: Initial Psych Review & Screening - 07/01/17 1059      Initial Review   Current issues with  Current Depression;Current Anxiety/Panic      Family Dynamics    Good Support System?  No    Strains  Intra-family strains;Illness and family care strain      Barriers   Psychosocial barriers to participate in program  Psychosocial barriers identified (see note)      Screening Interventions   Interventions  Encouraged to exercise;Other (comment)    Comments  currently sees counselor weekly and more often as needed related to caregiver strain. also member of caregiver support group at [facility]       Quality of Life Scores:   PHQ-9: Recent Review Flowsheet Data    Depression screen [facility] 2/9 08/29/2017 07/27/2017 07/04/2017 07/01/2017 05/19/2017   Decreased Interest _0 Down, Depressed, Hopeless _1 PHQ - 2 Score _2 Altered sleeping 0 0 - 1 1   Tired, decreased energy 1 1 - 1 1   Change in appetite 0 1 - 1 1   Feeling bad or failure about yourself  0 0 - 1 1   Trouble concentrating 0 1 - 1 1   Moving slowly or fidgety/restless 0 0 - 0 1   Suicidal thoughts 0 0 - 0 0   PHQ-9 Score 3 5 - 8 10   Difficult doing work/chores Somewhat difficult - - Somewhat difficult Very difficult     Interpretation of Total Score  Total Score Depression Severity:  1-4 = Minimal depression, 5-9 = Mild depression, 10-14 = Moderate depression, 15-19 = Moderately severe depression, 20-27 = Severe depression   Psychosocial Evaluation and Intervention: Psychosocial Evaluation - 07/01/17 1102      Psychosocial Evaluation & Interventions   Interventions  Stress management education;Encouraged to exercise with the program and follow exercise prescription    Expected Outcomes  psychosocial issues will not become a barrier to participation to pulmonary rehab    Continue Psychosocial Services   Follow up required by staff       Psychosocial Re-Evaluation: Psychosocial Re-Evaluation    [place] Name 08/05/17 1231 08/29/17 1235           Psychosocial Re-Evaluation   Current issues with  Current Stress Concerns  Current Stress Concerns       Comments  her stress concerns are being managed and are not barriers to her participation in pulmonary rehab  her stress concerns are being managed and are not barriers to her participation in pulmonary rehab      Expected Outcomes  patient will remain free from psychosocial barriers  patient will remain free from psychosocial barriers      Interventions  Encouraged to attend Pulmonary Rehabilitation for the exercise  Encouraged to attend Pulmonary Rehabilitation for the exercise      Continue Psychosocial Services   Follow up required by staff  Follow up required by staff      Comments  50 year old mothers unwillingness to accept help from agencies in her home.  -        Initial Review   Source of Stress Concerns  Family  -         Psychosocial Discharge (Final Psychosocial Re-Evaluation): Psychosocial Re-Evaluation - 08/29/17 1235      Psychosocial Re-Evaluation   Current issues with  Current Stress Concerns    Comments  her stress concerns are being managed and are not barriers to her participation in pulmonary rehab    Expected Outcomes  patient will remain free from psychosocial barriers    Interventions  Encouraged to attend Pulmonary Rehabilitation for the exercise  Continue Psychosocial Services   Follow up required by staff       Education: Education Goals: Education classes will be provided on a weekly basis, covering required topics. Participant will state understanding/return demonstration of topics presented.  Learning Barriers/Preferences: Learning Barriers/Preferences - 07/01/17 1055      Learning Barriers/Preferences   Learning Barriers  None    Learning Preferences  Computer/Internet;Individual Instruction;Verbal Instruction;Written Material       Education Topics: Risk Factor Reduction:  -Group instruction that is supported by a PowerPoint presentation. Instructor discusses the definition of a risk factor, different risk factors for pulmonary disease, and how  the heart and lungs work together.     Nutrition for Pulmonary Patient:  -Group instruction provided by PowerPoint slides, verbal discussion, and written materials to support subject matter. The instructor gives an explanation and review of healthy diet recommendations, which includes a discussion on weight management, recommendations for fruit and vegetable consumption, as well as protein, fluid, caffeine, fiber, sodium, sugar, and alcohol. Tips for eating when patients are short of breath are discussed.   PULMONARY REHAB OTHER RESPIRATORY from 08/18/2017 in [place]  Date  08/04/17  Educator  edna  Instruction Review Code  2- meets goals/outcomes      Pursed Lip Breathing:  -Group instruction that is supported by demonstration and informational handouts. Instructor discusses the benefits of pursed lip and diaphragmatic breathing and detailed demonstration on how to preform both.     Oxygen Safety:  -Group instruction provided by PowerPoint, verbal discussion, and written material to support subject matter. There is an overview of "What is Oxygen" and "Why do we need it".  Instructor also reviews how to create a safe environment for oxygen use, the importance of using oxygen as prescribed, and the risks of noncompliance. There is a brief discussion on traveling with oxygen and resources the patient may utilize.   PULMONARY REHAB OTHER RESPIRATORY from 08/18/2017 in [place]  Date  08/11/17  Educator  Truddie Crumble  Instruction Review Code  2- meets goals/outcomes      Oxygen Equipment:  -Group instruction provided by [employer] Staff utilizing handouts, written materials, and equipment demonstrations.   Signs and Symptoms:  -Group instruction provided by written material and verbal discussion to support subject matter. Warning signs and symptoms of infection, stroke, and heart attack are reviewed and when to call the  physician/911 reinforced. Tips for preventing the spread of infection discussed.   Advanced Directives:  -Group instruction provided by verbal instruction and written material to support subject matter. Instructor reviews Advanced Directive laws and proper instruction for filling out document.   Pulmonary Video:  -Group video education that reviews the importance of medication and oxygen compliance, exercise, good nutrition, pulmonary hygiene, and pursed lip and diaphragmatic breathing for the pulmonary patient.   PULMONARY REHAB OTHER RESPIRATORY from 08/18/2017 in [place]  Date  07/14/17  Instruction Review Code  2- meets goals/outcomes      Exercise for the Pulmonary Patient:  -Group instruction that is supported by a PowerPoint presentation. Instructor discusses benefits of exercise, core components of exercise, frequency, duration, and intensity of an exercise routine, importance of utilizing pulse oximetry during exercise, safety while exercising, and options of places to exercise outside of rehab.     Pulmonary Medications:  -Verbally interactive group education provided by instructor with focus on inhaled medications and proper administration.   PULMONARY REHAB OTHER RESPIRATORY  from 08/18/2017 in [place]  Date  08/18/17  Educator  Pharm D  Instruction Review Code  2- meets goals/outcomes      Anatomy and Physiology of the Respiratory System and Intimacy:  -Group instruction provided by PowerPoint, verbal discussion, and written material to support subject matter. Instructor reviews respiratory cycle and anatomical components of the respiratory system and their functions. Instructor also reviews differences in obstructive and restrictive respiratory diseases with examples of each. Intimacy, Sex, and Sexuality differences are reviewed with a discussion on how relationships can change when diagnosed with pulmonary  disease. Common sexual concerns are reviewed.   MD DAY -A group question and answer session with a medical doctor that allows participants to ask questions that relate to their pulmonary disease state.   PULMONARY REHAB OTHER RESPIRATORY from 08/18/2017 in [place]  Date  07/26/17  Educator  Dr. Nelda Marseille  Instruction Review Code  2- meets goals/outcomes      OTHER EDUCATION -Group or individual verbal, written, or video instructions that support the educational goals of the pulmonary rehab program.   Knowledge Questionnaire Score: Knowledge Questionnaire Score - 07/13/17 1405      Knowledge Questionnaire Score   Pre Score  11/13       Core Components/Risk Factors/Patient Goals at Admission: Personal Goals and Risk Factors at Admission - 07/01/17 1056      Core Components/Risk Factors/Patient Goals on Admission   Improve shortness of breath with ADL's  Yes    Intervention  Provide education, individualized exercise plan and daily activity instruction to help decrease symptoms of SOB with activities of daily living.    Expected Outcomes  Short Term: Achieves a reduction of symptoms when performing activities of daily living.    Develop more efficient breathing techniques such as purse lipped breathing and diaphragmatic breathing; and practicing self-pacing with activity  Yes    Intervention  Provide education, demonstration and support about specific breathing techniuqes utilized for more efficient breathing. Include techniques such as pursed lipped breathing, diaphragmatic breathing and self-pacing activity.    Expected Outcomes  Short Term: Participant will be able to demonstrate and use breathing techniques as needed throughout daily activities.    Increase knowledge of respiratory medications and ability to use respiratory devices properly   Yes    Intervention  Provide education and demonstration as needed of appropriate use of medications,  inhalers, and oxygen therapy.    Expected Outcomes  Short Term: Achieves understanding of medications use. Understands that oxygen is a medication prescribed by physician. Demonstrates appropriate use of inhaler and oxygen therapy.    Stress  Yes    Intervention  Offer individual and/or small group education and counseling on adjustment to heart disease, stress management and health-related lifestyle change. Teach and support self-help strategies.;Refer participants experiencing significant psychosocial distress to appropriate mental health specialists for further evaluation and treatment. When possible, include family members and significant others in education/counseling sessions.    Expected Outcomes  Short Term: Participant demonstrates changes in health-related behavior, relaxation and other stress management skills, ability to obtain effective social support, and compliance with psychotropic medications if prescribed.;Long Term: Emotional wellbeing is indicated by absence of clinically significant psychosocial distress or social isolation.       Core Components/Risk Factors/Patient Goals Review:  Goals and Risk Factor Review    [place] Name 08/05/17 1221 08/29/17 1228           Core Components/Risk Factors/Patient Goals  Review   Personal Goals Review  Develop more efficient breathing techniques such as purse lipped breathing and diaphragmatic breathing and practicing self-pacing with activity.;Improve shortness of breath with ADL's;Increase knowledge of respiratory medications and ability to use respiratory devices properly.;Stress  Develop more efficient breathing techniques such as purse lipped breathing and diaphragmatic breathing and practicing self-pacing with activity.;Improve shortness of breath with ADL's;Increase knowledge of respiratory medications and ability to use respiratory devices properly.;Stress      Review  patient is doing well in pulmonary rehab. She verbalizes an improvement  in her shortness of breath and finds using pursed lip breathing extremely helpful and gives her the additional breath she needs to do her ADLs. She learned alot about her medications on Dr. Elana Alm when she was able to ask and have explained by our medical director how her respiratory medications worked. her stress in ongoing but she states she is doing a better job managing it.  patient continues to work hard in pulmonary rehab. She struggles emotionally with having to increase her oxygen prescription to meet her exercise demand. She is amazed as to how much more energy she has when she wears the correct ammount of oxygen required to keep her sats greater than 88-90%. She utilizes PLB independently and has taken a class with the pharmacist on pulmonary medication. She feels her shortness of breath is improving but only when wearing her oxygen. Her stress level is improving as we are resolving her pulmonary barriers to exercise.      Expected Outcomes  see admission outcomes  see admission outcomes         Core Components/Risk Factors/Patient Goals at Discharge (Final Review):  Goals and Risk Factor Review - 08/29/17 1228      Core Components/Risk Factors/Patient Goals Review   Personal Goals Review  Develop more efficient breathing techniques such as purse lipped breathing and diaphragmatic breathing and practicing self-pacing with activity.;Improve shortness of breath with ADL's;Increase knowledge of respiratory medications and ability to use respiratory devices properly.;Stress    Review  patient continues to work hard in pulmonary rehab. She struggles emotionally with having to increase her oxygen prescription to meet her exercise demand. She is amazed as to how much more energy she has when she wears the correct ammount of oxygen required to keep her sats greater than 88-90%. She utilizes PLB independently and has taken a class with the pharmacist on pulmonary medication. She feels her shortness of breath  is improving but only when wearing her oxygen. Her stress level is improving as we are resolving her pulmonary barriers to exercise.    Expected Outcomes  see admission outcomes       ITP Comments:   Comments: ITP REVIEW Pt is making expected progress toward pulmonary rehab goals after completing 13 sessions. Recommend continued exercise, life style modification, education, and utilization of breathing techniques to increase stamina and strength and decrease shortness of breath with exertion.

## 2017-09-06 ENCOUNTER — Encounter (HOSPITAL_COMMUNITY)
Admission: RE | Admit: 2017-09-06 | Discharge: 2017-09-06 | Disposition: A | Discharge status: Home / Self Care | Payer: Medicare Other | Source: Ambulatory Visit | Attending: Internal Medicine | Admitting: Internal Medicine

## 2017-09-06 VITALS — Wt 123.5 lb

## 2017-09-06 DIAGNOSIS — I272 Pulmonary hypertension, unspecified: Secondary | ICD-10-CM

## 2017-09-06 DIAGNOSIS — F4323 Adjustment disorder with mixed anxiety and depressed mood: Secondary | ICD-10-CM | POA: Diagnosis not present

## 2017-09-06 DIAGNOSIS — F329 Major depressive disorder, single episode, unspecified: Secondary | ICD-10-CM | POA: Diagnosis not present

## 2017-09-06 NOTE — Progress Notes (Signed)
Daily Session Note  Patient Details  Name: Kayla Mack MRN: [id number] Date of Birth: [birth year] Referring Provider:     Pulmonary Rehab Walk Test from 07/07/2017 in [place]  Referring Provider  Dr. Lake Bells      Encounter Date: 09/06/2017  Check In: Session Check In - 09/06/17 1015      Check-In   Location  [facility]    Staff Present  Su Hilt, MS, ACSM RCEP, Exercise Physiologist;Xochilth Standish Leonia Reeves, RN, Luisa Hart, RN, Roque Cash, RN    Supervising physician immediately available to respond to emergencies  [facility] immediately available    Physician(s)  Dr. Maylene Roes    Medication changes reported      No    Fall or balance concerns reported     No    Tobacco Cessation  No Change    Warm-up and Cool-down  Performed as group-led instruction    Resistance Training Performed  Yes    VAD Patient?  No      Pain Assessment   Currently in Pain?  No/denies    Multiple Pain Sites  No       Capillary Blood Glucose: No results found for this or any previous visit (from the past 24 hour(s)).  Exercise Prescription Changes - 09/06/17 1200      Response to Exercise   Blood Pressure (Admit)  124/60    Blood Pressure (Exercise)  144/80    Blood Pressure (Exit)  104/54    Heart Rate (Admit)  67 bpm    Heart Rate (Exercise)  92 bpm    Heart Rate (Exit)  77 bpm    Oxygen Saturation (Admit)  96 %    Oxygen Saturation (Exercise)  87 % increased to 90 with PLB    Oxygen Saturation (Exit)  89 %    Rating of Perceived Exertion (Exercise)  15    Perceived Dyspnea (Exercise)  3    Duration  Progress to 45 minutes of aerobic exercise without signs/symptoms of physical distress    Intensity  THRR unchanged      Progression   Progression  Continue to progress workloads to maintain intensity without signs/symptoms of physical distress.      Resistance Training   Training Prescription  Yes    Weight  orange bands     Reps  10-15    Time  10 Minutes      Interval Training   Interval Training  No      Oxygen   Oxygen  Continuous      Recumbant Bike   Level  3    Minutes  17      NuStep   Level  4    Minutes  17    METs  3.2      Track   Laps  9    Minutes  17       Social History   Tobacco Use  Smoking Status Former Smoker  . Packs/day: 1.50  . Years: 40.00  . Pack years: 60.00  . Types: Cigarettes  . Last attempt to quit: 03/18/2017  . Years since quitting: 0.4  Smokeless Tobacco Never Used    Goals Met:  Exercise tolerated well Strength training completed today  Goals Unmet:  Not Applicable  Comments: Service time is from 1030 to 1210    Dr. Rush Farmer is Medical Director for Pulmonary Rehab at [facility].

## 2017-09-07 DIAGNOSIS — I272 Pulmonary hypertension, unspecified: Secondary | ICD-10-CM | POA: Diagnosis not present

## 2017-09-13 ENCOUNTER — Encounter (HOSPITAL_COMMUNITY)
Admission: RE | Admit: 2017-09-13 | Discharge: 2017-09-13 | Disposition: A | Discharge status: Home / Self Care | Payer: Medicare Other | Source: Ambulatory Visit | Attending: Internal Medicine | Admitting: Internal Medicine

## 2017-09-13 VITALS — Wt 123.5 lb

## 2017-09-13 DIAGNOSIS — I272 Pulmonary hypertension, unspecified: Secondary | ICD-10-CM | POA: Diagnosis not present

## 2017-09-13 DIAGNOSIS — F4323 Adjustment disorder with mixed anxiety and depressed mood: Secondary | ICD-10-CM | POA: Diagnosis not present

## 2017-09-13 DIAGNOSIS — F329 Major depressive disorder, single episode, unspecified: Secondary | ICD-10-CM | POA: Diagnosis not present

## 2017-09-13 NOTE — Progress Notes (Signed)
Daily Session Note  Patient Details  Name: Kayla Mack MRN: [id number] Date of Birth: 28-Dec-1945 Referring Provider:     Pulmonary Rehab Walk Test from 07/07/2017 in [place]  Referring Provider  Dr. Lake Bells      Encounter Date: 09/13/2017  Check In: Session Check In - 09/13/17 1238      Check-In   Location  [facility]    Staff Present  Su Hilt, MS, ACSM RCEP, Exercise Physiologist;Joan Leonia Reeves, RN, BSN;Lisa Hughes, RN;Portia Rollene Rotunda, RN, BSN    Supervising physician immediately available to respond to emergencies  [facility] immediately available    Physician(s)  Dr. Maylene Roes    Medication changes reported      No    Fall or balance concerns reported     No    Tobacco Cessation  No Change    Warm-up and Cool-down  Performed as group-led instruction    Resistance Training Performed  Yes    VAD Patient?  No      Pain Assessment   Currently in Pain?  No/denies    Multiple Pain Sites  No       Capillary Blood Glucose: No results found for this or any previous visit (from the past 24 hour(s)).  Exercise Prescription Changes - 09/13/17 1200      Response to Exercise   Blood Pressure (Admit)  106/64    Blood Pressure (Exercise)  134/70    Blood Pressure (Exit)  104/64    Heart Rate (Admit)  66 bpm    Heart Rate (Exercise)  99 bpm    Heart Rate (Exit)  70 bpm    Oxygen Saturation (Admit)  94 %    Oxygen Saturation (Exercise)  90 % increased to 90 with PLB    Oxygen Saturation (Exit)  93 %    Rating of Perceived Exertion (Exercise)  13    Perceived Dyspnea (Exercise)  3    Duration  Progress to 45 minutes of aerobic exercise without signs/symptoms of physical distress    Intensity  THRR unchanged      Progression   Progression  Continue to progress workloads to maintain intensity without signs/symptoms of physical distress.      Resistance Training   Training Prescription  Yes    Weight  orange bands     Reps  10-15    Time  10 Minutes      Interval Training   Interval Training  No      Oxygen   Oxygen  Continuous      Recumbant Bike   Level  3    Minutes  17      NuStep   Level  5    Minutes  17    METs  3.2      Track   Laps  16    Minutes  17       Social History   Tobacco Use  Smoking Status Former Smoker  . Packs/day: 1.50  . Years: 40.00  . Pack years: 60.00  . Types: Cigarettes  . Last attempt to quit: 03/18/2017  . Years since quitting: 0.4  Smokeless Tobacco Never Used    Goals Met:  Exercise tolerated well No report of cardiac concerns or symptoms Strength training completed today  Goals Unmet:  Not Applicable  Comments: Service time is from 10:30a to 12:15p    Dr. Rush Farmer is Medical Director for Pulmonary Rehab at [facility]  [facility].

## 2017-09-15 ENCOUNTER — Encounter (HOSPITAL_COMMUNITY)
Admission: RE | Admit: 2017-09-15 | Discharge: 2017-09-15 | Disposition: A | Discharge status: Home / Self Care | Payer: Medicare Other | Source: Ambulatory Visit | Attending: Internal Medicine | Admitting: Internal Medicine

## 2017-09-15 DIAGNOSIS — I272 Pulmonary hypertension, unspecified: Secondary | ICD-10-CM

## 2017-09-15 DIAGNOSIS — M810 Age-related osteoporosis without current pathological fracture: Secondary | ICD-10-CM | POA: Diagnosis not present

## 2017-09-15 DIAGNOSIS — G5601 Carpal tunnel syndrome, right upper limb: Secondary | ICD-10-CM | POA: Diagnosis not present

## 2017-09-15 DIAGNOSIS — F419 Anxiety disorder, unspecified: Secondary | ICD-10-CM | POA: Diagnosis not present

## 2017-09-15 DIAGNOSIS — E7800 Pure hypercholesterolemia, unspecified: Secondary | ICD-10-CM | POA: Diagnosis not present

## 2017-09-20 ENCOUNTER — Encounter (HOSPITAL_COMMUNITY)
Admission: RE | Admit: 2017-09-20 | Discharge: 2017-09-20 | Disposition: A | Discharge status: Home / Self Care | Payer: Medicare Other | Source: Ambulatory Visit | Attending: Internal Medicine | Admitting: Internal Medicine

## 2017-09-20 VITALS — Wt 124.1 lb

## 2017-09-20 DIAGNOSIS — F329 Major depressive disorder, single episode, unspecified: Secondary | ICD-10-CM | POA: Diagnosis not present

## 2017-09-20 DIAGNOSIS — I272 Pulmonary hypertension, unspecified: Secondary | ICD-10-CM | POA: Insufficient documentation

## 2017-09-20 DIAGNOSIS — F4323 Adjustment disorder with mixed anxiety and depressed mood: Secondary | ICD-10-CM | POA: Diagnosis not present

## 2017-09-20 NOTE — Progress Notes (Signed)
Daily Session Note  Patient Details  Name: Kayla Mack MRN: [id number] Date of Birth: [birth year] Referring Provider:     Pulmonary Rehab Walk Test from 07/07/2017 in [place]  Referring Provider  Dr. Lake Bells      Encounter Date: 09/20/2017  Check In: Session Check In - 09/20/17 1030      Check-In   Location  [facility]    Staff Present  Rosebud Poles, RN, BSN;Molly diVincenzo, MS, ACSM RCEP, Exercise Physiologist;Lisa Ysidro Evert, RN;Portia Rollene Rotunda, RN, BSN    Supervising physician immediately available to respond to emergencies  [facility] immediately available    Physician(s)  Dr. [place] Crane    Medication changes reported      No    Fall or balance concerns reported     No    Tobacco Cessation  No Change    Warm-up and Cool-down  Performed as group-led instruction    Resistance Training Performed  Yes    VAD Patient?  No      Pain Assessment   Currently in Pain?  No/denies    Multiple Pain Sites  No       Capillary Blood Glucose: No results found for this or any previous visit (from the past 24 hour(s)).  Exercise Prescription Changes - 09/20/17 1200      Response to Exercise   Blood Pressure (Admit)  104/60    Blood Pressure (Exercise)  112/78    Blood Pressure (Exit)  110/76    Heart Rate (Admit)  71 bpm    Heart Rate (Exercise)  115 bpm    Heart Rate (Exit)  67 bpm    Oxygen Saturation (Admit)  97 %    Oxygen Saturation (Exercise)  92 %    Oxygen Saturation (Exit)  97 %    Rating of Perceived Exertion (Exercise)  13    Perceived Dyspnea (Exercise)  2    Duration  Progress to 45 minutes of aerobic exercise without signs/symptoms of physical distress    Intensity  THRR unchanged      Progression   Progression  Continue to progress workloads to maintain intensity without signs/symptoms of physical distress.      Resistance Training   Training Prescription  Yes    Weight  orange bands    Reps  10-15    Time   10 Minutes      Interval Training   Interval Training  No      Oxygen   Oxygen  Continuous    Liters  6-8      Recumbant Bike   Level  3    Minutes  17      NuStep   Level  5    Minutes  17    METs  3.6      Track   Laps  12    Minutes  17       Social History   Tobacco Use  Smoking Status Former Smoker  . Packs/day: 1.50  . Years: 40.00  . Pack years: 60.00  . Types: Cigarettes  . Last attempt to quit: 03/18/2017  . Years since quitting: 0.5  Smokeless Tobacco Never Used    Goals Met:  Exercise tolerated well Strength training completed today  Goals Unmet:  Not Applicable  Comments:Service time is from 1030 to 1205    Dr. Rush Farmer is Medical Director for Pulmonary Rehab at [facility].

## 2017-09-22 ENCOUNTER — Encounter (HOSPITAL_COMMUNITY)
Admission: RE | Admit: 2017-09-22 | Discharge: 2017-09-22 | Disposition: A | Discharge status: Home / Self Care | Payer: Medicare Other | Source: Ambulatory Visit | Attending: Internal Medicine | Admitting: Internal Medicine

## 2017-09-22 VITALS — Wt 124.8 lb

## 2017-09-22 DIAGNOSIS — I272 Pulmonary hypertension, unspecified: Secondary | ICD-10-CM

## 2017-09-22 NOTE — Progress Notes (Signed)
Pulmonary Individual Treatment Plan  Patient Details  Name: Kayla Mack MRN: [id number] Date of Birth: 11-22-1945 Referring Provider:     Pulmonary Rehab Walk Test from 07/07/2017 in [place]  Referring Provider  Dr. Lake Bells      Initial Encounter Date:    Pulmonary Rehab Walk Test from 07/07/2017 in [place]  Date  07/12/17  Referring Provider  Dr. Lake Bells      Visit Diagnosis: Pulmonary hypertension ([place])  Patient's Home Medications on Admission:   Current Outpatient Medications:  .  acetaminophen (TYLENOL) 500 MG tablet, Take 1,000 mg by mouth 2 (two) times daily as needed for pain. , Disp: , Rfl:  .  Albuterol Sulfate (VENTOLIN HFA IN), Inhale 2 puffs into the lungs 4 (four) times daily as needed., Disp: , Rfl:  .  ANORO ELLIPTA 62.5-25 MCG/INH AEPB, Inhale 1 puff into the lungs daily., Disp: , Rfl:  .  bisoprolol (ZEBETA) 5 MG tablet, Take 1 tablet (5 mg total) by mouth daily., Disp: 30 tablet, Rfl: 0 .  buPROPion (WELLBUTRIN SR) 150 MG 12 hr tablet, Take 150 mg by mouth daily., Disp: , Rfl:  .  calcium-vitamin D (OSCAL WITH D) 500-200 MG-UNIT per tablet, Take 1 tablet by mouth 2 (two) times daily., Disp: , Rfl:  .  clonazePAM (KLONOPIN) 0.5 MG tablet, Take 1 tablet (0.5 mg total) by mouth 2 (two) times daily as needed for anxiety. (Patient taking differently: Take 0.5 mg by mouth daily as needed for anxiety. ), Disp: 10 tablet, Rfl: 0 .  clopidogrel (PLAVIX) 75 MG tablet, Take 1 tablet (75 mg total) by mouth daily., Disp: 30 tablet, Rfl: 0 .  feeding supplement, ENSURE ENLIVE, (ENSURE ENLIVE) LIQD, Take 237 mLs by mouth 2 (two) times daily between meals. (Patient not taking: Reported on 08/29/2017), Disp: 237 mL, Rfl: 12 .  fluticasone (FLOVENT HFA) 110 MCG/ACT inhaler, Inhale 2 puffs into the lungs 2 (two) times daily. (Patient not taking: Reported on 07/27/2017), Disp: 1 Inhaler, Rfl: 0 .  lovastatin  (MEVACOR) 40 MG tablet, Take 40 mg by mouth every morning., Disp: , Rfl:  .  nicotine (NICODERM CQ - DOSED IN MG/24 HOURS) 21 mg/24hr patch, Place 21 mg onto the skin daily., Disp: , Rfl:  .  PARoxetine (PAXIL) 20 MG tablet, Take 20 mg by mouth daily., Disp: , Rfl:  .  VENTOLIN HFA 108 (90 Base) MCG/ACT inhaler, , Disp: , Rfl:  .  Vitamin D, Ergocalciferol, (DRISDOL) 50000 units CAPS capsule, Take 50,000 Units by mouth every 7 (seven) days., Disp: , Rfl:   Past Medical History: Past Medical History:  Diagnosis Date  . Anxiety   . COPD (chronic obstructive pulmonary disease) ([place])   . Depression   . Hyperlipidemia   . Hypertension     Tobacco Use: Social History   Tobacco Use  Smoking Status Former Smoker  . Packs/day: 1.50  . Years: 40.00  . Pack years: 60.00  . Types: Cigarettes  . Last attempt to quit: 03/18/2017  . Years since quitting: 0.5  Smokeless Tobacco Never Used    Labs: Recent Chemical engineer    Labs for ITP Cardiac and Pulmonary Rehab Latest Ref Rng & Units 05/04/2017 05/05/2017 05/07/2017 05/09/2017 05/09/2017   Cholestrol 0 - 200 mg/dL 118 - - - -   LDLCALC 0 - 99 mg/dL 72 - - - -   HDL >40 mg/dL 31(L) - - - -  Trlycerides <150 mg/dL 76 - - - -   Hemoglobin A1c 4.8 - 5.6 % 5.8(H) - - - -   PHART 7.350 - 7.450 - 7.249(L) 7.311(L) 7.423 -   PCO2ART 32.0 - 48.0 mmHg - 53.9(H) 59.3(H) 56.6(H) -   HCO3 20.0 - 28.0 mmol/L 26.4 22.8 29.0(H) 36.9(H) 39.0(H)   TCO2 0 - 100 mmol/L 28 - - 39 41   ACIDBASEDEF 0.0 - 2.0 mmol/L - 3.4(H) - - -   O2SAT % 84.0 98.8 98.2 90.0 65.0      Capillary Blood Glucose: Lab Results  Component Value Date   GLUCAP 148 (H) 05/06/2017     Pulmonary Assessment Scores: Pulmonary Assessment Scores    Row Name 07/13/17 1405         ADL UCSD   ADL Phase  Entry     SOB Score total  55       CAT Score   CAT Score  23 Entry        Pulmonary Function Assessment: Pulmonary Function Assessment - 07/01/17 1055      Breath    Bilateral Breath Sounds  Clear;Decreased    Shortness of Breath  Yes;Limiting activity       Exercise Target Goals:    Exercise Program Goal: Individual exercise prescription set with THRR, safety & activity barriers. Participant demonstrates ability to understand and report RPE using BORG scale, to self-measure pulse accurately, and to acknowledge the importance of the exercise prescription.  Exercise Prescription Goal: Starting with aerobic activity 30 plus minutes a day, 3 days per week for initial exercise prescription. Provide home exercise prescription and guidelines that participant acknowledges understanding prior to discharge.  Activity Barriers & Risk Stratification: Activity Barriers & Cardiac Risk Stratification - 07/01/17 1034      Activity Barriers & Cardiac Risk Stratification   Activity Barriers  Deconditioning;Muscular Weakness;Shortness of Breath       6 Minute Walk: 6 Minute Walk    Row Name 07/12/17 0735         6 Minute Walk   Phase  Initial     Distance  1200 feet     Walk Time  6 minutes     # of Rest Breaks  0     MPH  2.27     METS  2.76     RPE  11     Perceived Dyspnea   1     Symptoms  No     Resting HR  77 bpm     Resting BP  149/80     Resting Oxygen Saturation   86 % 2 liters/88% on 4 liters     Exercise Oxygen Saturation  during 6 min walk  85 %     Max Ex. HR  100 bpm     Max Ex. BP  163/75     2 Minute Post BP  174/82       Interval HR   1 Minute HR  77     2 Minute HR  78     3 Minute HR  95     4 Minute HR  100     5 Minute HR  95     6 Minute HR  93     2 Minute Post HR  74     Interval Heart Rate?  Yes       Interval Oxygen   Interval Oxygen?  Yes     Baseline Oxygen Saturation %  86 %     1 Minute Oxygen Saturation %  94 %     1 Minute Liters of Oxygen  4 L     2 Minute Oxygen Saturation %  91 %     2 Minute Liters of Oxygen  4 L     3 Minute Oxygen Saturation %  87 %     3 Minute Liters of Oxygen  4 L     4  Minute Oxygen Saturation %  85 %     4 Minute Liters of Oxygen  6 L     5 Minute Oxygen Saturation %  86 %     5 Minute Liters of Oxygen  6 L     6 Minute Oxygen Saturation %  88 %     6 Minute Liters of Oxygen  6 L     2 Minute Post Oxygen Saturation %  97 %     2 Minute Post Liters of Oxygen  6 L        Oxygen Initial Assessment: Oxygen Initial Assessment - 07/12/17 0742      Initial 6 min Walk   Oxygen Used  Continuous;E-Tanks started on 2 liters and titrated up    Liters per minute  6      Program Oxygen Prescription   Program Oxygen Prescription  Continuous;E-Tanks    Liters per minute  6       Oxygen Re-Evaluation: Oxygen Re-Evaluation    Row Name 08/05/17 1213 08/29/17 1212 08/30/17 1616 09/20/17 0736       Program Oxygen Prescription   Program Oxygen Prescription  Continuous;E-Tanks  Continuous;E-Tanks  -  Continuous;E-Tanks    Liters per minute  6  8  -  8    Comments  -  -  patient had to increase oxygen with ambulation today from 8 lpm to 10 lpm for desaturation to 86% on 8 lpm  patient now requires 10 liters for ambulation on the track      Home Oxygen   Home Oxygen Device  E-Tanks  E-Tanks  -  E-Tanks    Sleep Oxygen Prescription  Continuous  Continuous  -  Continuous    Liters per minute  3  3  -  3    Home Exercise Oxygen Prescription  -  Continuous  -  Continuous    Liters per minute  -  8  -  8 10 with brisk ambulation    Home at Rest Exercise Oxygen Prescription  -  Continuous  -  -    Liters per minute  -  3  -  3    Compliance with Home Oxygen Use  -  -  -  Yes      Goals/Expected Outcomes   Short Term Goals  To learn and exhibit compliance with exercise, home and travel O2 prescription;To learn and understand importance of monitoring SPO2 with pulse oximeter and demonstrate accurate use of the pulse oximeter.;To learn and understand importance of maintaining oxygen saturations>88%;To learn and demonstrate proper pursed lip breathing techniques or  other breathing techniques.;To learn and demonstrate proper use of respiratory medications  To learn and exhibit compliance with exercise, home and travel O2 prescription;To learn and understand importance of monitoring SPO2 with pulse oximeter and demonstrate accurate use of the pulse oximeter.;To learn and understand importance of maintaining oxygen saturations>88%;To learn and demonstrate proper pursed lip breathing techniques or other breathing techniques.;To learn and demonstrate proper use of respiratory  medications  -  To learn and exhibit compliance with exercise, home and travel O2 prescription;To learn and understand importance of monitoring SPO2 with pulse oximeter and demonstrate accurate use of the pulse oximeter.;To learn and understand importance of maintaining oxygen saturations>88%;To learn and demonstrate proper pursed lip breathing techniques or other breathing techniques.;To learn and demonstrate proper use of respiratory medications    Long  Term Goals  Exhibits compliance with exercise, home and travel O2 prescription;Verbalizes importance of monitoring SPO2 with pulse oximeter and return demonstration;Maintenance of O2 saturations>88%;Exhibits proper breathing techniques, such as pursed lip breathing or other method taught during program session;Compliance with respiratory medication;Demonstrates proper use of MDI's  Exhibits compliance with exercise, home and travel O2 prescription;Verbalizes importance of monitoring SPO2 with pulse oximeter and return demonstration;Maintenance of O2 saturations>88%;Exhibits proper breathing techniques, such as pursed lip breathing or other method taught during program session;Compliance with respiratory medication;Demonstrates proper use of MDI's  -  Exhibits compliance with exercise, home and travel O2 prescription;Verbalizes importance of monitoring SPO2 with pulse oximeter and return demonstration;Maintenance of O2 saturations>88%;Exhibits proper  breathing techniques, such as pursed lip breathing or other method taught during program session;Compliance with respiratory medication;Demonstrates proper use of MDI's    Comments  patient demonstrates correct use of home o2 by wearing her oxygen into pulmonary rehab on correct dosage with correct equiptment.   we are in the process of helping patient recieve correct oxygen supply devices for her at home needs.  -  patient now has multiple large e tanks with carrier, a larger high liter flow concentrator with autofill capabilities.    Goals/Expected Outcomes  patient will verbalize compliance with home O2  patient will verbalize compliance with home O2  -  patient will verbalize compliance with home O2       Oxygen Discharge (Final Oxygen Re-Evaluation): Oxygen Re-Evaluation - 09/20/17 0736      Program Oxygen Prescription   Program Oxygen Prescription  Continuous;E-Tanks    Liters per minute  8    Comments  patient now requires 10 liters for ambulation on the track      Home Oxygen   Home Oxygen Device  E-Tanks    Sleep Oxygen Prescription  Continuous    Liters per minute  3    Home Exercise Oxygen Prescription  Continuous    Liters per minute  8 10 with brisk ambulation    Liters per minute  3    Compliance with Home Oxygen Use  Yes      Goals/Expected Outcomes   Short Term Goals  To learn and exhibit compliance with exercise, home and travel O2 prescription;To learn and understand importance of monitoring SPO2 with pulse oximeter and demonstrate accurate use of the pulse oximeter.;To learn and understand importance of maintaining oxygen saturations>88%;To learn and demonstrate proper pursed lip breathing techniques or other breathing techniques.;To learn and demonstrate proper use of respiratory medications    Long  Term Goals  Exhibits compliance with exercise, home and travel O2 prescription;Verbalizes importance of monitoring SPO2 with pulse oximeter and return  demonstration;Maintenance of O2 saturations>88%;Exhibits proper breathing techniques, such as pursed lip breathing or other method taught during program session;Compliance with respiratory medication;Demonstrates proper use of MDI's    Comments  patient now has multiple large e tanks with carrier, a larger high liter flow concentrator with autofill capabilities.    Goals/Expected Outcomes  patient will verbalize compliance with home O2       Initial Exercise Prescription: Initial Exercise Prescription -  07/12/17 0700      Date of Initial Exercise RX and Referring Provider   Date  07/12/17    Referring Provider  Dr. Lake Bells      Oxygen   Oxygen  Continuous    Liters  6      Recumbant Bike   Level  2    Minutes  17      NuStep   Level  2    Minutes  17    METs  1.3      Track   Laps  5    Minutes  17      Prescription Details   Frequency (times per week)  2    Duration  Progress to 45 minutes of aerobic exercise without signs/symptoms of physical distress      Intensity   THRR 40-80% of Max Heartrate  59-118    Ratings of Perceived Exertion  11-13    Perceived Dyspnea  0-4      Progression   Progression  Continue progressive overload as per policy without signs/symptoms or physical distress.      Resistance Training   Training Prescription  Yes    Weight  orange bands    Reps  10-15       Perform Capillary Blood Glucose checks as needed.  Exercise Prescription Changes: Exercise Prescription Changes    Row Name 07/14/17 1200 07/19/17 1200 07/21/17 1200 07/26/17 1300 08/02/17 1200     Response to Exercise   Blood Pressure (Admit)  102/60  118/62  124/70  140/68  124/64   Blood Pressure (Exercise)  152/64  140/60  132/72  138/62  130/70   Blood Pressure (Exit)  108/60  106/66  112/60  118/60  106/66   Heart Rate (Admit)  72 bpm  65 bpm  75 bpm  68 bpm  63 bpm   Heart Rate (Exercise)  107 bpm  84 bpm  122 bpm  87 bpm  84 bpm   Heart Rate (Exit)  74 bpm  70 bpm   74 bpm  68 bpm  67 bpm   Oxygen Saturation (Admit)  94 %  98 %  94 %  96 %  96 %   Oxygen Saturation (Exercise)  85 % O2 increased to 6L sat improved 88%  88 %  87 %  88 %  86 %   Oxygen Saturation (Exit)  95 %  97 %  95 %  98 %  91 %   Rating of Perceived Exertion (Exercise)  _0 Perceived Dyspnea (Exercise)  _1 Duration  Progress to 45 minutes of aerobic exercise without signs/symptoms of physical distress  Progress to 45 minutes of aerobic exercise without signs/symptoms of physical distress  Progress to 45 minutes of aerobic exercise without signs/symptoms of physical distress  Progress to 45 minutes of aerobic exercise without signs/symptoms of physical distress  Progress to 45 minutes of aerobic exercise without signs/symptoms of physical distress   Intensity  Other (comment) 40-80% of HRR  Other (comment) 40-80% of HRR  THRR unchanged  THRR unchanged  THRR unchanged     Progression   Progression  Continue to progress workloads to maintain intensity without signs/symptoms of physical distress.  Continue to progress workloads to maintain intensity without signs/symptoms of physical distress.  Continue to progress workloads to maintain intensity without signs/symptoms of physical distress.  Continue to progress workloads to maintain intensity without signs/symptoms of physical distress.  Continue to progress workloads to maintain intensity without signs/symptoms of physical distress.     Resistance Training   Training Prescription  Yes  Yes  Yes  Yes  Yes   Weight  orange bands  orange bands  orange bands  orange bands  orange bands   Reps  10-15  10-15  10-15  10-15  10-15   Time  10 Minutes  10 Minutes  10 Minutes  10 Minutes  10 Minutes     Oxygen   Oxygen  Continuous  Continuous  Continuous  Continuous  Continuous   Liters  _0 Recumbant Bike   Level  _1 Minutes  _2 NuStep   Level  _3 Minutes   _4 METs  2.2  2.2  2.4  2.9  2.7     Track   Laps  -  11  -  -  15   Minutes  -  17  -  -  17   Row Name 08/04/17 1258 08/09/17 1200 08/11/17 1200 08/16/17 1200 08/18/17 1200     Response to Exercise   Blood Pressure (Admit)  124/70  130/62  130/70  140/74  110/60   Blood Pressure (Exercise)  114/66  124/70  142/78  140/60  150/70   Blood Pressure (Exit)  134/70  120/70  118/70  126/64  126/66   Heart Rate (Admit)  72 bpm  72 bpm  68 bpm  68 bpm  79 bpm   Heart Rate (Exercise)  85 bpm  83 bpm  94 bpm  92 bpm  77 bpm   Heart Rate (Exit)  78 bpm  70 bpm  70 bpm  73 bpm  71 bpm   Oxygen Saturation (Admit)  91 %  93 %  93 %  94 %  94 %   Oxygen Saturation (Exercise)  90 %  86 %  88 %  89 %  92 %   Oxygen Saturation (Exit)  92 %  98 %  96 %  96 %  97 %   Rating of Perceived Exertion (Exercise)  _5 Perceived Dyspnea (Exercise)  _6 Duration  Progress to 45 minutes of aerobic exercise without signs/symptoms of physical distress  Progress to 45 minutes of aerobic exercise without signs/symptoms of physical distress  Progress to 45 minutes of aerobic exercise without signs/symptoms of physical distress  Progress to 45 minutes of aerobic exercise without signs/symptoms of physical distress  Progress to 45 minutes of aerobic exercise without signs/symptoms of physical distress   Intensity  THRR unchanged  THRR unchanged  THRR unchanged  THRR unchanged  THRR unchanged     Progression   Progression  Continue to progress workloads to maintain intensity without signs/symptoms of physical distress.  Continue to progress workloads to maintain intensity without signs/symptoms of physical distress.  Continue to progress workloads to maintain intensity without signs/symptoms of physical distress.  Continue to progress workloads to maintain intensity without signs/symptoms of physical distress.  Continue to  progress workloads to maintain intensity without  signs/symptoms of physical distress.     Resistance Training   Training Prescription  Yes  Yes  Yes  Yes  Yes   Weight  orange bands  orange bands  orange bands  orange bands  orange bands   Reps  10-15  10-15  10-15  10-15  10-15   Time  10 Minutes  10 Minutes  10 Minutes  10 Minutes  10 Minutes     Interval Training   Interval Training  -  -  -  No  No     Oxygen   Oxygen  Continuous  Continuous  Continuous  Continuous  Continuous   Liters  _0 6-8  6-8     Recumbant Bike   Level  -  _1 Minutes  -  _2 NuStep   Level  3  3  -  3  -   Minutes  17  17  -  17  -   METs  3  2.7  -  2.8  -     Track   Laps  -  -  _3 Minutes  -  _4 Row Name 08/23/17 1200 08/25/17 1300 08/30/17 1200 09/06/17 1200 09/13/17 1200     Response to Exercise   Blood Pressure (Admit)  120/64  110/52  120/60  124/60  106/64   Blood Pressure (Exercise)  110/64  120/60  161/66  144/80  134/70   Blood Pressure (Exit)  120/60  118/70  110/70  104/54  104/64   Heart Rate (Admit)  70 bpm  75 bpm  71 bpm  67 bpm  66 bpm   Heart Rate (Exercise)  98 bpm  74 bpm  119 bpm  92 bpm  99 bpm   Heart Rate (Exit)  66 bpm  72 bpm  66 bpm  77 bpm  70 bpm   Oxygen Saturation (Admit)  99 %  92 %  95 %  96 %  94 %   Oxygen Saturation (Exercise)  91 %  95 %  84 % oxygen increased to 8L sat improved to 91  87 % increased to 90 with PLB  90 % increased to 90 with PLB   Oxygen Saturation (Exit)  93 %  92 %  98 %  89 %  93 %   Rating of Perceived Exertion (Exercise)  _5 Perceived Dyspnea (Exercise)  _6 Duration  Progress to 45 minutes of aerobic exercise without signs/symptoms of physical distress  Progress to 45 minutes of aerobic exercise without signs/symptoms of physical distress  Progress to 45 minutes of aerobic exercise without signs/symptoms of physical distress  Progress to 45 minutes of aerobic exercise without signs/symptoms of physical  distress  Progress to 45 minutes of aerobic exercise without signs/symptoms of physical distress   Intensity  THRR unchanged  THRR unchanged  THRR unchanged  THRR unchanged  THRR unchanged     Progression   Progression  Continue to progress workloads to maintain intensity without signs/symptoms of physical distress.  Continue to progress workloads to maintain intensity without signs/symptoms of physical distress.  Continue to progress workloads  to maintain intensity without signs/symptoms of physical distress.  Continue to progress workloads to maintain intensity without signs/symptoms of physical distress.  Continue to progress workloads to maintain intensity without signs/symptoms of physical distress.     Resistance Training   Training Prescription  Yes  Yes  Yes  Yes  Yes   Weight  orange bands  orange bands  orange bands  orange bands  orange bands   Reps  10-15  10-15  10-15  10-15  10-15   Time  10 Minutes  10 Minutes  10 Minutes  10 Minutes  10 Minutes     Interval Training   Interval Training  No  No  No  No  No     Oxygen   Oxygen  Continuous  Continuous  Continuous  Continuous  Continuous   Liters  6-8  6-8  6-8  -  -     Recumbant Bike   Level  _0 Minutes  34  _1 NuStep   Level  4  -  _2 Minutes  17  -  _3 METs  3.3  -  3.9  3.2  3.2     Track   Laps  -  _4 Minutes  -  17  177  17  17     Home Exercise Plan   Plans to continue exercise at  [employer] (comment)  -  -  -  -   Frequency  Add 3 additional days to program exercise sessions.  -  -  -  -   Row Name 09/20/17 1200             Response to Exercise   Blood Pressure (Admit)  104/60       Blood Pressure (Exercise)  112/78       Blood Pressure (Exit)  110/76       Heart Rate (Admit)  71 bpm       Heart Rate (Exercise)  115 bpm       Heart Rate (Exit)  67 bpm       Oxygen Saturation (Admit)  97 %       Oxygen Saturation (Exercise)  92 %        Oxygen Saturation (Exit)  97 %       Rating of Perceived Exertion (Exercise)  13       Perceived Dyspnea (Exercise)  2       Duration  Progress to 45 minutes of aerobic exercise without signs/symptoms of physical distress       Intensity  THRR unchanged         Progression   Progression  Continue to progress workloads to maintain intensity without signs/symptoms of physical distress.         Resistance Training   Training Prescription  Yes       Weight  orange bands       Reps  10-15       Time  10 Minutes         Interval Training   Interval Training  No         Oxygen   Oxygen  Continuous       Liters  6-8  Recumbant Bike   Level  3       Minutes  17         NuStep   Level  5       Minutes  17       METs  3.6         Track   Laps  12       Minutes  17          Exercise Comments: Exercise Comments    Row Name 08/23/17 1254           Exercise Comments  home exercise completed          Exercise Goals and Review: Exercise Goals    [place] Name 07/01/17 1035             Exercise Goals   Increase Physical Activity  Yes       Intervention  Provide advice, education, support and counseling about physical activity/exercise needs.;Develop an individualized exercise prescription for aerobic and resistive training based on initial evaluation findings, risk stratification, comorbidities and participant's personal goals.       Expected Outcomes  Achievement of increased cardiorespiratory fitness and enhanced flexibility, muscular endurance and strength shown through measurements of functional capacity and personal statement of participant.       Increase Strength and Stamina  Yes       Intervention  Provide advice, education, support and counseling about physical activity/exercise needs.;Develop an individualized exercise prescription for aerobic and resistive training based on initial evaluation findings, risk stratification, comorbidities and participant's  personal goals.       Expected Outcomes  Achievement of increased cardiorespiratory fitness and enhanced flexibility, muscular endurance and strength shown through measurements of functional capacity and personal statement of participant.       Able to understand and use rate of perceived exertion (RPE) scale  Yes       Intervention  Provide education and explanation on how to use RPE scale       Expected Outcomes  Short Term: Able to use RPE daily in rehab to express subjective intensity level;Long Term:  Able to use RPE to guide intensity level when exercising independently       Able to understand and use Dyspnea scale  Yes       Intervention  Provide education and explanation on how to use Dyspnea scale       Expected Outcomes  Short Term: Able to use Dyspnea scale daily in rehab to express subjective sense of shortness of breath during exertion;Long Term: Able to use Dyspnea scale to guide intensity level when exercising independently       Knowledge and understanding of Target Heart Rate Range (THRR)  Yes       Intervention  Provide education and explanation of THRR including how the numbers were predicted and where they are located for reference       Expected Outcomes  Short Term: Able to state/look up THRR;Long Term: Able to use THRR to govern intensity when exercising independently;Short Term: Able to use daily as guideline for intensity in rehab       Understanding of Exercise Prescription  Yes       Intervention  Provide education, explanation, and written materials on patient's individual exercise prescription       Expected Outcomes  Short Term: Able to explain program exercise prescription;Long Term: Able to explain home exercise prescription to exercise independently  Exercise Goals Re-Evaluation : Exercise Goals Re-Evaluation    Row Name 08/08/17 0720 09/01/17 1627 09/19/17 1624         Exercise Goal Re-Evaluation   Exercise Goals Review  Increase Strength and  Stamina;Increase Physical Activity;Able to understand and use rate of perceived exertion (RPE) scale;Able to understand and use Dyspnea scale;Knowledge and understanding of Target Heart Rate Range (THRR);Understanding of Exercise Prescription  Increase Strength and Stamina;Increase Physical Activity;Able to understand and use rate of perceived exertion (RPE) scale;Able to understand and use Dyspnea scale;Knowledge and understanding of Target Heart Rate Range (THRR);Understanding of Exercise Prescription  Increase Strength and Stamina;Increase Physical Activity;Able to understand and use rate of perceived exertion (RPE) scale;Able to understand and use Dyspnea scale;Knowledge and understanding of Target Heart Rate Range (THRR);Understanding of Exercise Prescription     Comments  Patient is progressing well in program. She has a wonderful attitude and it motivated to work hard while she is here. She has walked up to 15 laps (200 ft each) in 15 minutes.   Patient is progressing well in program. She has a wonderful attitude and it motivated to work hard while she is here. She has walked up to 15 laps (200 ft each) in 15 minutes.   Patient is progressing well in program. She has a wonderful attitude and it motivated to work hard while she is here. METs place her in a "moderate" category. She has walked up to 16 laps (200 ft each) in 15 minutes.      Expected Outcomes  Through exercise at rehab and at home the patient will increase physical activity, strength, and decrease shortness of breath.   Through exercise at rehab and at home, patient will increase strength and stamina making ADL's easier to perform. Patient will also have a better understanding of safe exercise and what they are capable to do outside of clinical supervision.  Through exercise at rehab and at home, patient will increase strength and stamina making ADL's easier to perform. Patient will also have a better understanding of safe exercise and what  they are capable to do outside of clinical supervision.        Discharge Exercise Prescription (Final Exercise Prescription Changes): Exercise Prescription Changes - 09/20/17 1200      Response to Exercise   Blood Pressure (Admit)  104/60    Blood Pressure (Exercise)  112/78    Blood Pressure (Exit)  110/76    Heart Rate (Admit)  71 bpm    Heart Rate (Exercise)  115 bpm    Heart Rate (Exit)  67 bpm    Oxygen Saturation (Admit)  97 %    Oxygen Saturation (Exercise)  92 %    Oxygen Saturation (Exit)  97 %    Rating of Perceived Exertion (Exercise)  13    Perceived Dyspnea (Exercise)  2    Duration  Progress to 45 minutes of aerobic exercise without signs/symptoms of physical distress    Intensity  THRR unchanged      Progression   Progression  Continue to progress workloads to maintain intensity without signs/symptoms of physical distress.      Resistance Training   Training Prescription  Yes    Weight  orange bands    Reps  10-15    Time  10 Minutes      Interval Training   Interval Training  No      Oxygen   Oxygen  Continuous    Liters  6-8  Recumbant Bike   Level  3    Minutes  17      NuStep   Level  5    Minutes  17    METs  3.6      Track   Laps  12    Minutes  17       Nutrition:  Target Goals: Understanding of nutrition guidelines, daily intake of sodium <1581m, cholesterol <2090m calories 30% from fat and 7% or less from saturated fats, daily to have 5 or more servings of fruits and vegetables.  Biometrics: Pre Biometrics - 07/01/17 1134      Pre Biometrics   Grip Strength  15 kg        Nutrition Therapy Plan and Nutrition Goals: Nutrition Therapy & Goals - 07/04/17 1212      Nutrition Therapy   Diet  Therapeutic Lifestyle Changes      Personal Nutrition Goals   Nutrition Goal  The pt will recognize symptoms that can interfere with adequate oral intake, such as shortness of breath, N/V, early satiety, fatigue, ability to secure and  prepare food, taste and smell changes, chewing/swallowing difficulties, and/ or pain when eating.      Intervention Plan   Intervention  Prescribe, educate and counsel regarding individualized specific dietary modifications aiming towards targeted core components such as weight, hypertension, lipid management, diabetes, heart failure and other comorbidities.    Expected Outcomes  Short Term Goal: Understand basic principles of dietary content, such as calories, fat, sodium, cholesterol and nutrients.;Long Term Goal: Adherence to prescribed nutrition plan.       Nutrition Discharge: Rate Your Plate Scores: Nutrition Assessments - 07/04/17 1212      Rate Your Plate Scores   Pre Score  -- returned form; not completed       Nutrition Goals Re-Evaluation:   Nutrition Goals Discharge (Final Nutrition Goals Re-Evaluation):   Psychosocial: Target Goals: Acknowledge presence or absence of significant depression and/or stress, maximize coping skills, provide positive support system. Participant is able to verbalize types and ability to use techniques and skills needed for reducing stress and depression.  Initial Review & Psychosocial Screening: Initial Psych Review & Screening - 07/01/17 1059      Initial Review   Current issues with  Current Depression;Current Anxiety/Panic      Family Dynamics   Good Support System?  No    Strains  Intra-family strains;Illness and family care strain      Barriers   Psychosocial barriers to participate in program  Psychosocial barriers identified (see note)      Screening Interventions   Interventions  Encouraged to exercise;Other (comment)    Comments  currently sees counselor weekly and more often as needed related to caregiver strain. also member of caregiver support group at [facility]       Quality of Life Scores:   PHQ-9: Recent Review Flowsheet Data    [facility] 08/29/2017 07/27/2017 07/04/2017 07/01/2017 05/19/2017    Decreased Interest _0 Down, Depressed, Hopeless _1 PHQ - 2 Score _2 Altered sleeping 0 0 - 1 1   Tired, decreased energy 1 1 - 1 1   Change in appetite 0 1 - 1 1   Feeling bad or failure about yourself  0 0 - 1 1   Trouble concentrating 0 1 - 1 1   Moving slowly or fidgety/restless 0 0 -  0 1   Suicidal thoughts 0 0 - 0 0   PHQ-9 Score 3 5 - 8 10   Difficult doing work/chores Somewhat difficult - - Somewhat difficult Very difficult     Interpretation of Total Score  Total Score Depression Severity:  1-4 = Minimal depression, 5-9 = Mild depression, 10-14 = Moderate depression, 15-19 = Moderately severe depression, 20-27 = Severe depression   Psychosocial Evaluation and Intervention: Psychosocial Evaluation - 07/01/17 1102      Psychosocial Evaluation & Interventions   Interventions  Stress management education;Encouraged to exercise with the program and follow exercise prescription    Expected Outcomes  psychosocial issues will not become a barrier to participation to pulmonary rehab    Continue Psychosocial Services   Follow up required by staff       Psychosocial Re-Evaluation: Psychosocial Re-Evaluation    [place] Name 08/05/17 1231 08/29/17 1235 09/20/17 0741         Psychosocial Re-Evaluation   Current issues with  Current Stress Concerns  Current Stress Concerns  Current Stress Concerns     Comments  her stress concerns are being managed and are not barriers to her participation in pulmonary rehab  her stress concerns are being managed and are not barriers to her participation in pulmonary rehab  her stress concerns are being managed and are not barriers to her participation in pulmonary rehab     Expected Outcomes  patient will remain free from psychosocial barriers  patient will remain free from psychosocial barriers  patient will remain free from psychosocial barriers     Interventions  Encouraged to attend Pulmonary Rehabilitation for the exercise   Encouraged to attend Pulmonary Rehabilitation for the exercise  Encouraged to attend Pulmonary Rehabilitation for the exercise     Continue Psychosocial Services   Follow up required by staff  Follow up required by staff  Follow up required by staff     Comments  10 year old mothers unwillingness to accept help from agencies in her home.  -  49 year old mothers unwillingness to accept help from agencies in her home.       Initial Review   Source of Stress Concerns  Family  -  Family        Psychosocial Discharge (Final Psychosocial Re-Evaluation): Psychosocial Re-Evaluation - 09/20/17 0741      Psychosocial Re-Evaluation   Current issues with  Current Stress Concerns    Comments  her stress concerns are being managed and are not barriers to her participation in pulmonary rehab    Expected Outcomes  patient will remain free from psychosocial barriers    Interventions  Encouraged to attend Pulmonary Rehabilitation for the exercise    Continue Psychosocial Services   Follow up required by staff    Comments  54 year old mothers unwillingness to accept help from agencies in her home.      Initial Review   Source of Stress Concerns  Family       Education: Education Goals: Education classes will be provided on a weekly basis, covering required topics. Participant will state understanding/return demonstration of topics presented.  Learning Barriers/Preferences: Learning Barriers/Preferences - 07/01/17 1055      Learning Barriers/Preferences   Learning Barriers  None    Learning Preferences  Computer/Internet;Individual Instruction;Verbal Instruction;Written Material       Education Topics: Risk Factor Reduction:  -Group instruction that is supported by a PowerPoint presentation. Instructor discusses the definition of a risk factor, different risk  factors for pulmonary disease, and how the heart and lungs work together.     Nutrition for Pulmonary Patient:  -Group instruction  provided by PowerPoint slides, verbal discussion, and written materials to support subject matter. The instructor gives an explanation and review of healthy diet recommendations, which includes a discussion on weight management, recommendations for fruit and vegetable consumption, as well as protein, fluid, caffeine, fiber, sodium, sugar, and alcohol. Tips for eating when patients are short of breath are discussed.   PULMONARY REHAB OTHER RESPIRATORY from 09/15/2017 in [facility]  Date  08/04/17  Educator  edna  Instruction Review Code  2- meets goals/outcomes      Pursed Lip Breathing:  -Group instruction that is supported by demonstration and informational handouts. Instructor discusses the benefits of pursed lip and diaphragmatic breathing and detailed demonstration on how to preform both.     Oxygen Safety:  -Group instruction provided by PowerPoint, verbal discussion, and written material to support subject matter. There is an overview of "What is Oxygen" and "Why do we need it".  Instructor also reviews how to create a safe environment for oxygen use, the importance of using oxygen as prescribed, and the risks of noncompliance. There is a brief discussion on traveling with oxygen and resources the patient may utilize.   PULMONARY REHAB OTHER RESPIRATORY from 09/15/2017 in [place]  Date  08/11/17  Educator  Truddie Crumble  Instruction Review Code  2- meets goals/outcomes      Oxygen Equipment:  -Group instruction provided by [employer] Staff utilizing handouts, written materials, and equipment demonstrations.   PULMONARY REHAB OTHER RESPIRATORY from 09/15/2017 in [place]  Date  09/15/17  Educator  [facility]  Instruction Review Code  2- meets goals/outcomes      Signs and Symptoms:  -Group instruction provided by written material and verbal discussion to support subject matter.  Warning signs and symptoms of infection, stroke, and heart attack are reviewed and when to call the physician/911 reinforced. Tips for preventing the spread of infection discussed.   Advanced Directives:  -Group instruction provided by verbal instruction and written material to support subject matter. Instructor reviews Advanced Directive laws and proper instruction for filling out document.   Pulmonary Video:  -Group video education that reviews the importance of medication and oxygen compliance, exercise, good nutrition, pulmonary hygiene, and pursed lip and diaphragmatic breathing for the pulmonary patient.   PULMONARY REHAB OTHER RESPIRATORY from 09/15/2017 in [place]  Date  07/14/17  Instruction Review Code  2- meets goals/outcomes      Exercise for the Pulmonary Patient:  -Group instruction that is supported by a PowerPoint presentation. Instructor discusses benefits of exercise, core components of exercise, frequency, duration, and intensity of an exercise routine, importance of utilizing pulse oximetry during exercise, safety while exercising, and options of places to exercise outside of rehab.     Pulmonary Medications:  -Verbally interactive group education provided by instructor with focus on inhaled medications and proper administration.   PULMONARY REHAB OTHER RESPIRATORY from 09/15/2017 in [place]  Date  08/18/17  Educator  Pharm D  Instruction Review Code  2- meets goals/outcomes      Anatomy and Physiology of the Respiratory System and Intimacy:  -Group instruction provided by PowerPoint, verbal discussion, and written material to support subject matter. Instructor reviews respiratory cycle and anatomical components of the respiratory system and  their functions. Instructor also reviews differences in obstructive and restrictive respiratory diseases with examples of each. Intimacy, Sex, and Sexuality  differences are reviewed with a discussion on how relationships can change when diagnosed with pulmonary disease. Common sexual concerns are reviewed.   MD DAY -A group question and answer session with a medical doctor that allows participants to ask questions that relate to their pulmonary disease state.   PULMONARY REHAB OTHER RESPIRATORY from 09/15/2017 in [place]  Date  07/26/17  Educator  Dr. Nelda Marseille  Instruction Review Code  2- meets goals/outcomes      OTHER EDUCATION -Group or individual verbal, written, or video instructions that support the educational goals of the pulmonary rehab program.   Knowledge Questionnaire Score: Knowledge Questionnaire Score - 07/13/17 1405      Knowledge Questionnaire Score   Pre Score  11/13       Core Components/Risk Factors/Patient Goals at Admission: Personal Goals and Risk Factors at Admission - 07/01/17 1056      Core Components/Risk Factors/Patient Goals on Admission   Improve shortness of breath with ADL's  Yes    Intervention  Provide education, individualized exercise plan and daily activity instruction to help decrease symptoms of SOB with activities of daily living.    Expected Outcomes  Short Term: Achieves a reduction of symptoms when performing activities of daily living.    Develop more efficient breathing techniques such as purse lipped breathing and diaphragmatic breathing; and practicing self-pacing with activity  Yes    Intervention  Provide education, demonstration and support about specific breathing techniuqes utilized for more efficient breathing. Include techniques such as pursed lipped breathing, diaphragmatic breathing and self-pacing activity.    Expected Outcomes  Short Term: Participant will be able to demonstrate and use breathing techniques as needed throughout daily activities.    Increase knowledge of respiratory medications and ability to use respiratory devices properly   Yes     Intervention  Provide education and demonstration as needed of appropriate use of medications, inhalers, and oxygen therapy.    Expected Outcomes  Short Term: Achieves understanding of medications use. Understands that oxygen is a medication prescribed by physician. Demonstrates appropriate use of inhaler and oxygen therapy.    Stress  Yes    Intervention  Offer individual and/or small group education and counseling on adjustment to heart disease, stress management and health-related lifestyle change. Teach and support self-help strategies.;Refer participants experiencing significant psychosocial distress to appropriate mental health specialists for further evaluation and treatment. When possible, include family members and significant others in education/counseling sessions.    Expected Outcomes  Short Term: Participant demonstrates changes in health-related behavior, relaxation and other stress management skills, ability to obtain effective social support, and compliance with psychotropic medications if prescribed.;Long Term: Emotional wellbeing is indicated by absence of clinically significant psychosocial distress or social isolation.       Core Components/Risk Factors/Patient Goals Review:  Goals and Risk Factor Review    Row Name 08/05/17 1221 08/29/17 1228 09/20/17 0739         Core Components/Risk Factors/Patient Goals Review   Personal Goals Review  Develop more efficient breathing techniques such as purse lipped breathing and diaphragmatic breathing and practicing self-pacing with activity.;Improve shortness of breath with ADL's;Increase knowledge of respiratory medications and ability to use respiratory devices properly.;Stress  Develop more efficient breathing techniques such as purse lipped breathing and diaphragmatic breathing and practicing self-pacing with activity.;Improve shortness of breath with ADL's;Increase knowledge of  respiratory medications and ability to use respiratory  devices properly.;Stress  Develop more efficient breathing techniques such as purse lipped breathing and diaphragmatic breathing and practicing self-pacing with activity.;Improve shortness of breath with ADL's;Increase knowledge of respiratory medications and ability to use respiratory devices properly.;Stress     Review  patient is doing well in pulmonary rehab. She verbalizes an improvement in her shortness of breath and finds using pursed lip breathing extremely helpful and gives her the additional breath she needs to do her ADLs. She learned alot about her medications on Dr. Elana Alm when she was able to ask and have explained by our medical director how her respiratory medications worked. her stress in ongoing but she states she is doing a better job managing it.  patient continues to work hard in pulmonary rehab. She struggles emotionally with having to increase her oxygen prescription to meet her exercise demand. She is amazed as to how much more energy she has when she wears the correct ammount of oxygen required to keep her sats greater than 88-90%. She utilizes PLB independently and has taken a class with the pharmacist on pulmonary medication. She feels her shortness of breath is improving but only when wearing her oxygen. Her stress level is improving as we are resolving her pulmonary barriers to exercise.  patient is making significant progress in pulmoary rehab now that she does not have the stress of not having adequate oxygen at home. Her shortness of breath is improving with her ADLs, especially walking her dog. She is using PLB independently without coaching and states now that her oxygen issues are settles her stress level has minimized. She continues to manage the stress related to her 47f year old mother gracefully.     Expected Outcomes  see admission outcomes  see admission outcomes  see admission outcomes        Core Components/Risk Factors/Patient Goals at Discharge (Final Review):  Goals  and Risk Factor Review - 09/20/17 0739      Core Components/Risk Factors/Patient Goals Review   Personal Goals Review  Develop more efficient breathing techniques such as purse lipped breathing and diaphragmatic breathing and practicing self-pacing with activity.;Improve shortness of breath with ADL's;Increase knowledge of respiratory medications and ability to use respiratory devices properly.;Stress    Review  patient is making significant progress in pulmoary rehab now that she does not have the stress of not having adequate oxygen at home. Her shortness of breath is improving with her ADLs, especially walking her dog. She is using PLB independently without coaching and states now that her oxygen issues are settles her stress level has minimized. She continues to manage the stress related to her 916fyear old mother gracefully.    Expected Outcomes  see admission outcomes       ITP Comments:   Comments: patient has attended 17 sessions since admission

## 2017-09-22 NOTE — Progress Notes (Signed)
Daily Session Note  Patient Details  Name: Kayla Mack MRN: [id number] Date of Birth: 04-02-1946 Referring Provider:     Pulmonary Rehab Walk Test from 07/07/2017 in [place]  Referring Provider  Dr. Lake Bells      Encounter Date: 09/22/2017  Check In: Session Check In - 09/22/17 1058      Check-In   Location  [facility]    Staff Present  Trish Fountain, RN, BSN;Lisa Ysidro Evert, RN;Joan Johnston, RN, BSN;Korryn Pancoast, MS, ACSM RCEP, Exercise Physiologist    Supervising physician immediately available to respond to emergencies  [facility] immediately available    Physician(s)  Dr. Starla Link    Medication changes reported      No    Fall or balance concerns reported     No    Tobacco Cessation  No Change    Warm-up and Cool-down  Performed as group-led instruction    Resistance Training Performed  Yes    VAD Patient?  No      Pain Assessment   Currently in Pain?  No/denies    Multiple Pain Sites  No       Capillary Blood Glucose: No results found for this or any previous visit (from the past 24 hour(s)).  Exercise Prescription Changes - 09/22/17 1200      Response to Exercise   Blood Pressure (Admit)  100/50    Blood Pressure (Exercise)  132/76    Blood Pressure (Exit)  130/68    Heart Rate (Admit)  68 bpm    Heart Rate (Exercise)  81 bpm    Heart Rate (Exit)  67 bpm    Oxygen Saturation (Admit)  92 %    Oxygen Saturation (Exercise)  96 %    Oxygen Saturation (Exit)  97 %    Rating of Perceived Exertion (Exercise)  13    Perceived Dyspnea (Exercise)  2    Duration  Progress to 45 minutes of aerobic exercise without signs/symptoms of physical distress    Intensity  THRR unchanged      Progression   Progression  Continue to progress workloads to maintain intensity without signs/symptoms of physical distress.      Resistance Training   Training Prescription  Yes    Weight  orange bands    Reps  10-15    Time   10 Minutes      Interval Training   Interval Training  No      Oxygen   Oxygen  Continuous    Liters  6-8      NuStep   Level  5    Minutes  17    METs  3.3      Track   Laps  16    Minutes  17       Social History   Tobacco Use  Smoking Status Former Smoker  . Packs/day: 1.50  . Years: 40.00  . Pack years: 60.00  . Types: Cigarettes  . Last attempt to quit: 03/18/2017  . Years since quitting: 0.5  Smokeless Tobacco Never Used    Goals Met:  Exercise tolerated well No report of cardiac concerns or symptoms Strength training completed today  Goals Unmet:  Not Applicable  Comments: Service time is from 10:30a to 12:30p    Dr. Rush Farmer is Medical Director for Pulmonary Rehab at [facility].

## 2017-09-26 ENCOUNTER — Ambulatory Visit (INDEPENDENT_AMBULATORY_CARE_PROVIDER_SITE_OTHER): Payer: Self-pay | Admitting: Orthopaedic Surgery

## 2017-09-27 ENCOUNTER — Encounter (HOSPITAL_COMMUNITY): Payer: Medicare Other

## 2017-09-28 ENCOUNTER — Other Ambulatory Visit: Payer: Self-pay

## 2017-09-28 NOTE — Patient Outreach (Signed)
[facility]) Care Management  [place]  09/28/2017  Kayla Mack 08/04/1946 [id number]  Subjective: Telephone call placed to the patient for monthly outreach. HIPAA verified.  The patient states that she is doing well.  She denies pain or falls. She states that she is adherent with her medications.  She has not had a cigarette since we last spoke.  RN Health Coach congratulated her on her progress.  The patient is still going to pulmonary rehab and has signed up for silver sneakers.  She has been homebound due to the weather.  RN Health coach discussed COPD action plan with the patient.  She verbalized understanding.  Objective:   Encounter Medications:  Outpatient Encounter Medications as of 09/28/2017  Medication Sig Note  . acetaminophen (TYLENOL) 500 MG tablet Take 1,000 mg by mouth 2 (two) times daily as needed for pain.    . Albuterol Sulfate (VENTOLIN HFA IN) Inhale 2 puffs into the lungs 4 (four) times daily as needed.   [name] ELLIPTA 62.5-25 MCG/INH AEPB Inhale 1 puff into the lungs daily.   . bisoprolol (ZEBETA) 5 MG tablet Take 1 tablet (5 mg total) by mouth daily.   [name] buPROPion (WELLBUTRIN SR) 150 MG 12 hr tablet Take 150 mg by mouth daily.   . calcium-vitamin D (OSCAL WITH D) 500-200 MG-UNIT per tablet Take 1 tablet by mouth 2 (two) times daily.   . clonazePAM (KLONOPIN) 0.5 MG tablet Take 1 tablet (0.5 mg total) by mouth 2 (two) times daily as needed for anxiety. (Patient taking differently: Take 0.5 mg by mouth daily as needed for anxiety. )   . clopidogrel (PLAVIX) 75 MG tablet Take 1 tablet (75 mg total) by mouth daily.   . fluticasone (FLOVENT HFA) 110 MCG/ACT inhaler Inhale 2 puffs into the lungs 2 (two) times daily.   [name] lovastatin (MEVACOR) 40 MG tablet Take 40 mg by mouth every morning.   [name] PARoxetine (PAXIL) 20 MG tablet Take 20 mg by mouth daily.   . VENTOLIN HFA 108 (90 Base) MCG/ACT inhaler    . Vitamin D, Ergocalciferol, (DRISDOL)  50000 units CAPS capsule Take 50,000 Units by mouth every 7 (seven) days. 05/04/2017: Sundays  . feeding supplement, ENSURE ENLIVE, (ENSURE ENLIVE) LIQD Take 237 mLs by mouth 2 (two) times daily between meals. (Patient not taking: Reported on 08/29/2017)   . nicotine (NICODERM CQ - DOSED IN MG/24 HOURS) 21 mg/24hr patch Place 21 mg onto the skin daily.    No facility-administered encounter medications on file as of 09/28/2017.     Functional Status:  In your present state of health, do you have any difficulty performing the following activities: 05/19/2017 05/04/2017  Hearing? N N  Vision? N N  Difficulty concentrating or making decisions? N N  Walking or climbing stairs? N N  Dressing or bathing? N N  Doing errands, shopping? Y N  Preparing Food and eating ? N -  Using the Toilet? N -  In the past six months, have you accidently leaked urine? N -  Do you have problems with loss of bowel control? N -  Managing your Medications? N -  Managing your Finances? N -  Housekeeping or managing your Housekeeping? Y -  Some recent data might be hidden    Fall/Depression Screening: Fall Risk  09/28/2017 08/29/2017 07/27/2017  Falls in the past year? No No No  Risk for fall due to : - - -   PHQ 2/9 Scores 08/29/2017  07/27/2017 07/04/2017 07/01/2017 05/19/2017 05/16/2017  PHQ - 2 Score _0 PHQ- 9 Score 3 5 - _1 Assessment: Patient continues to benefit from health coach outreach for disease management and support.   THN CM Care Plan Problem One     Most Recent Value  Care Plan for Problem One  Active  THN Long Term Goal   Patient will be able to explain COPD zones on action plan within the next 90 days   THN Long Term Goal Start Date  07/04/17  Interventions for Problem One Markle reinterated copd action plan with the paitent due to the weather that we are having.  THN CM Short Term Goal #1   In 30 days the patient will be able to express one way of  coping with anxiety with her breathing.  THN CM Short Term Goal #1 Start Date  08/29/17  [facility] CM Short Term Goal #1 Met Date  09/28/17  Interventions for Short Term Goal #1  RN Health Coach will send the patient educational material to review for dicussion at the next phone call  THN CM Short Term Goal #2   In 30 days the patient will be able to verbalize starting silver sneakers program that she is looking into.  THN CM Short Term Goal #2 Start Date  08/29/17  [facility] CM Short Term Goal #2 Met Date  09/28/17  Interventions for Short Term Goal #2  RN Health Coach dicussed the benefits of adding exercise into her routine and to be mindful not over exert herself.     Plan:  RN Health Coach will contact patient in the month of January and patient agrees to next outreach.  Lazaro Arms RN, BSN, [place] Direct Dial:  [phone] Fax: [phone]

## 2017-09-29 ENCOUNTER — Encounter (HOSPITAL_COMMUNITY)
Admission: RE | Admit: 2017-09-29 | Discharge: 2017-09-29 | Disposition: A | Discharge status: Home / Self Care | Payer: Medicare Other | Source: Ambulatory Visit | Attending: Internal Medicine | Admitting: Internal Medicine

## 2017-09-29 VITALS — Wt 124.1 lb

## 2017-09-29 DIAGNOSIS — I272 Pulmonary hypertension, unspecified: Secondary | ICD-10-CM

## 2017-09-29 NOTE — Progress Notes (Signed)
Daily Session Note  Patient Details  Name: Kayla Mack MRN: [id number] Date of Birth: [birth year] Referring Provider:     Pulmonary Rehab Walk Test from 07/07/2017 in [place]  Referring Provider  Dr. Lake Bells      Encounter Date: 09/29/2017  Check In: Session Check In - 09/29/17 1030      Check-In   Location  [facility]    Staff Present  Rosebud Poles, RN, BSN;Genean Adamski, MS, ACSM RCEP, Exercise Physiologist;Portia Rollene Rotunda, RN, Marga Melnick, RN, BSN    Supervising physician immediately available to respond to emergencies  [facility] immediately available    Physician(s)  Dr. Starla Link    Medication changes reported      No    Fall or balance concerns reported     No    Tobacco Cessation  No Change    Warm-up and Cool-down  Performed as group-led instruction    Resistance Training Performed  Yes    VAD Patient?  No      Pain Assessment   Multiple Pain Sites  No       Capillary Blood Glucose: No results found for this or any previous visit (from the past 24 hour(s)).  Exercise Prescription Changes - 09/29/17 1200      Response to Exercise   Blood Pressure (Admit)  118/60    Blood Pressure (Exercise)  118/70    Blood Pressure (Exit)  98/64    Heart Rate (Admit)  71 bpm    Heart Rate (Exercise)  97 bpm    Heart Rate (Exit)  67 bpm    Oxygen Saturation (Admit)  94 %    Oxygen Saturation (Exercise)  89 %    Oxygen Saturation (Exit)  98 %    Rating of Perceived Exertion (Exercise)  15    Perceived Dyspnea (Exercise)  3    Duration  Progress to 45 minutes of aerobic exercise without signs/symptoms of physical distress    Intensity  THRR unchanged      Progression   Progression  Continue to progress workloads to maintain intensity without signs/symptoms of physical distress.      Resistance Training   Training Prescription  Yes    Weight  orange bands    Reps  10-15    Time  10 Minutes      Interval Training   Interval Training  No      Oxygen   Oxygen  Continuous    Liters  6-8      Recumbant Bike   Level  3    Minutes  17      NuStep   Level  5    Minutes  17    METs  3.2      Track   Laps  13    Minutes  17       Social History   Tobacco Use  Smoking Status Former Smoker  . Packs/day: 1.50  . Years: 40.00  . Pack years: 60.00  . Types: Cigarettes  . Last attempt to quit: 03/18/2017  . Years since quitting: 0.5  Smokeless Tobacco Never Used    Goals Met:  Exercise tolerated well No report of cardiac concerns or symptoms Strength training completed today  Goals Unmet:  Not Applicable  Comments: Service time is from 10:30a to 12:05p    Dr. Rush Farmer is Medical Director for Pulmonary Rehab at [facility].

## 2017-10-04 ENCOUNTER — Encounter (HOSPITAL_COMMUNITY)
Admission: RE | Admit: 2017-10-04 | Discharge: 2017-10-04 | Disposition: A | Discharge status: Home / Self Care | Payer: Medicare Other | Source: Ambulatory Visit | Attending: Internal Medicine | Admitting: Internal Medicine

## 2017-10-04 DIAGNOSIS — I272 Pulmonary hypertension, unspecified: Secondary | ICD-10-CM

## 2017-10-04 DIAGNOSIS — F4323 Adjustment disorder with mixed anxiety and depressed mood: Secondary | ICD-10-CM | POA: Diagnosis not present

## 2017-10-04 DIAGNOSIS — F329 Major depressive disorder, single episode, unspecified: Secondary | ICD-10-CM | POA: Diagnosis not present

## 2017-10-04 NOTE — Progress Notes (Signed)
Daily Session Note  Patient Details  Name: Kayla Mack MRN: [id number] Date of Birth: [birth year] Referring Provider:     Pulmonary Rehab Walk Test from 07/07/2017 in [place]  Referring Provider  Dr. Lake Bells      Encounter Date: 10/04/2017  Check In: Session Check In - 10/04/17 1501      Check-In   Location  [facility]    Staff Present  Rosebud Poles, RN, BSN;Molly diVincenzo, MS, ACSM RCEP, Exercise Physiologist;Sharne Linders [place] Evert, RN;Portia Rollene Rotunda, RN, BSN    Supervising physician immediately available to respond to emergencies  [facility] immediately available    Physician(s)  Dr. Eliseo Squires    Medication changes reported      No    Fall or balance concerns reported     No    Tobacco Cessation  No Change    Warm-up and Cool-down  Performed as group-led instruction    Resistance Training Performed  Yes    VAD Patient?  No      Pain Assessment   Currently in Pain?  No/denies    Multiple Pain Sites  No       Capillary Blood Glucose: No results found for this or any previous visit (from the past 24 hour(s)).  Exercise Prescription Changes - 10/04/17 1500      Response to Exercise   Blood Pressure (Admit)  130/60    Blood Pressure (Exit)  104/60    Heart Rate (Admit)  68 bpm    Heart Rate (Exercise)  103 bpm    Heart Rate (Exit)  73 bpm    Oxygen Saturation (Admit)  97 %    Oxygen Saturation (Exercise)  88 %    Oxygen Saturation (Exit)  96 %    Rating of Perceived Exertion (Exercise)  15    Perceived Dyspnea (Exercise)  3    Duration  Progress to 45 minutes of aerobic exercise without signs/symptoms of physical distress    Intensity  THRR unchanged      Progression   Progression  Continue to progress workloads to maintain intensity without signs/symptoms of physical distress.      Resistance Training   Training Prescription  Yes    Weight  orange bands    Reps  10-15    Time  10 Minutes      Interval Training    Interval Training  No      Oxygen   Oxygen  Continuous    Liters  6-8      Recumbant Bike   Level  3    Minutes  17      NuStep   Level  5    Minutes  17    METs  3.4      Track   Laps  14    Minutes  17       Social History   Tobacco Use  Smoking Status Former Smoker  . Packs/day: 1.50  . Years: 40.00  . Pack years: 60.00  . Types: Cigarettes  . Last attempt to quit: 03/18/2017  . Years since quitting: 0.5  Smokeless Tobacco Never Used    Goals Met:  Exercise tolerated well No report of cardiac concerns or symptoms Strength training completed today  Goals Unmet:  Not Applicable  Comments: Service time is from 1030 to 1215     Dr. Rush Farmer is Medical Director for Pulmonary Rehab at [facility].

## 2017-10-06 ENCOUNTER — Encounter (HOSPITAL_COMMUNITY)
Admission: RE | Admit: 2017-10-06 | Discharge: 2017-10-06 | Disposition: A | Discharge status: Home / Self Care | Payer: Medicare Other | Source: Ambulatory Visit | Attending: Internal Medicine | Admitting: Internal Medicine

## 2017-10-06 VITALS — Wt 124.6 lb

## 2017-10-06 DIAGNOSIS — I272 Pulmonary hypertension, unspecified: Secondary | ICD-10-CM | POA: Diagnosis not present

## 2017-10-06 NOTE — Progress Notes (Signed)
Kayla Mack 71 y.o. female  DOB: 1946/09/30 MRN: [id number]           Nutrition Note Dx: Pulmonary HTN   Current tobacco use? No; Pt recently quit tobacco use 03/18/2017 Labs:  Lipid Panel     Component Value Date/Time   CHOL 118 05/04/2017 1100   TRIG 76 05/04/2017 1100   HDL 31 (L) 05/04/2017 1100   CHOLHDL 3.8 05/04/2017 1100   VLDL 15 05/04/2017 1100   LDLCALC 72 05/04/2017 1100    Lab Results  Component Value Date   HGBA1C 5.8 (H) 05/04/2017  Note Spoke with pt. Pt is at a normal wt. Pt states she lost down to 108 lb "due to illness." Pt UBW reported ~ 130 lb. Pt wants to maintain her current wt. 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 Pt struggles grocery shopping and caring food into the house. Pt has hired someone to help her with her mom 5 hours/week, which includes some grocery shopping. Ways to ease grocery shopping discussed.  Pt expressed understanding of the information reviewed via feedback method.    Weight /BMI 10/06/2017 09/29/2017 09/22/2017 09/20/2017  WEIGHT 124 lb 9 oz 124 lb 1.9 oz 124 lb 12.5 oz 124 lb 1.9 oz   Weight /BMI 09/13/2017 09/06/2017 08/30/2017 08/25/2017  WEIGHT 123 lb 7.3 oz 123 lb 7.3 oz 122 lb 2.2 oz 121 lb 14.6 oz   Weight /BMI 08/23/2017 08/18/2017 08/16/2017 08/11/2017  WEIGHT 121 lb 7.6 oz 122 lb 2.2 oz 121 lb 14.6 oz 119 lb 11.4 oz   Weight /BMI 08/09/2017 08/04/2017 08/02/2017 07/26/2017  WEIGHT 119 lb 11.4 oz 120 lb 9.5 oz 119 lb 11.4 oz 118 lb 6.2 oz   Weight /BMI 07/21/2017 07/21/2017 07/19/2017 07/14/2017  WEIGHT 119 lb 0.8 oz 118 lb 119 lb 7.8 oz 117 lb 4.6 oz   Weight /BMI 07/01/2017 06/06/2017 05/11/2017 05/06/2017  WEIGHT 118 lb 13.3 oz 112 lb 112 lb 9.6 oz    Weight /BMI 06/03/2013  WEIGHT 113 lb 8.6 oz   Nutrition Diagnosis ? Food-and nutrition-related knowledge deficit related to lack of exposure to information as related to diagnosis of pulmonary disease  Goal(s) 1. The pt will recognize symptoms that can  interfere with adequate oral intake, such as shortness of breath, N/V, early satiety, fatigue, ability to secure and prepare food, taste and smell changes, chewing/swallowing difficulties, and/ or pain when eating.  Nutrition Intervention ? Pt's individual nutrition plan and goals reviewed with pt. ? Benefits of adopting healthy eating habits discussed when pt's Rate Your Plate reviewed. ? Pt to attend the Nutrition and Lung Disease class  Plan:  Pt to attend Pulmonary Nutrition class Will provide client-centered nutrition education as part of interdisciplinary care.   Monitor and evaluate progress toward nutrition goal with team.  Monitor and Evaluate progress toward nutrition goal with team.   Derek Mound, M.Ed, RD, LDN, CDE 10/06/2017 1:44 PM

## 2017-10-06 NOTE — Progress Notes (Signed)
Daily Session Note  Patient Details  Name: Kayla Mack MRN: [id number] Date of Birth: 03/04/1946 Referring Provider:     Pulmonary Rehab Walk Test from 07/07/2017 in [place]  Referring Provider  Dr. Lake Bells      Encounter Date: 10/06/2017  Check In: Session Check In - 10/06/17 1117      Check-In   Location  [facility]    Staff Present  Su Hilt, MS, ACSM RCEP, Exercise Physiologist;Lisa Ysidro Evert, RN;Portia Rollene Rotunda, RN, BSN    Supervising physician immediately available to respond to emergencies  [facility] immediately available    Physician(s)  Dr. Wendee Beavers    Medication changes reported      No    Fall or balance concerns reported     No    Tobacco Cessation  No Change    Warm-up and Cool-down  Performed as group-led instruction    Resistance Training Performed  Yes    VAD Patient?  No      Pain Assessment   Currently in Pain?  No/denies    Multiple Pain Sites  No       Capillary Blood Glucose: No results found for this or any previous visit (from the past 24 hour(s)).  Exercise Prescription Changes - 10/06/17 1300      Response to Exercise   Blood Pressure (Admit)  122/68    Blood Pressure (Exercise)  120/60    Blood Pressure (Exit)  122/62    Heart Rate (Admit)  70 bpm    Heart Rate (Exercise)  89 bpm    Heart Rate (Exit)  70 bpm    Oxygen Saturation (Admit)  93 %    Oxygen Saturation (Exercise)  93 %    Oxygen Saturation (Exit)  97 %    Rating of Perceived Exertion (Exercise)  13    Perceived Dyspnea (Exercise)  2    Duration  Progress to 45 minutes of aerobic exercise without signs/symptoms of physical distress    Intensity  THRR unchanged      Progression   Progression  Continue to progress workloads to maintain intensity without signs/symptoms of physical distress.      Resistance Training   Training Prescription  Yes    Weight  orange bands    Reps  10-15    Time  10 Minutes      Interval Training   Interval Training  No      Oxygen   Oxygen  Continuous    Liters  6-8      Recumbant Bike   Level  3    Minutes  17      NuStep   Level  5    Minutes  17    METs  2       Social History   Tobacco Use  Smoking Status Former Smoker  . Packs/day: 1.50  . Years: 40.00  . Pack years: 60.00  . Types: Cigarettes  . Last attempt to quit: 03/18/2017  . Years since quitting: 0.5  Smokeless Tobacco Never Used    Goals Met:  Exercise tolerated well No report of cardiac concerns or symptoms Strength training completed today  Goals Unmet:  Not Applicable  Comments: Service time is from 10:30a to 12:45p    Dr. Rush Farmer is Medical Director for Pulmonary Rehab at [facility].

## 2017-10-13 ENCOUNTER — Encounter (HOSPITAL_COMMUNITY)
Admission: RE | Admit: 2017-10-13 | Discharge: 2017-10-13 | Disposition: A | Discharge status: Home / Self Care | Payer: Medicare Other | Source: Ambulatory Visit | Attending: Internal Medicine | Admitting: Internal Medicine

## 2017-10-13 VITALS — Wt 127.4 lb

## 2017-10-13 DIAGNOSIS — I272 Pulmonary hypertension, unspecified: Secondary | ICD-10-CM

## 2017-10-13 NOTE — Progress Notes (Signed)
Daily Session Note  Patient Details  Name: Kayla Mack MRN: [id number] Date of Birth: May 25, 1946 Referring Provider:     Pulmonary Rehab Walk Test from 07/07/2017 in [place]  Referring Provider  Dr. Lake Bells      Encounter Date: 10/13/2017  Check In: Session Check In - 10/13/17 1025      Check-In   Location  [facility]    Staff Present  Rosebud Poles, RN, BSN;Lisa Ysidro Evert, RN;Portia Rollene Rotunda, RN, BSN    Supervising physician immediately available to respond to emergencies  [facility] immediately available    Physician(s)  Dr. Rockne Menghini    Medication changes reported      No    Fall or balance concerns reported     No    Tobacco Cessation  No Change    Warm-up and Cool-down  Performed as group-led instruction    Resistance Training Performed  Yes    VAD Patient?  No      Pain Assessment   Currently in Pain?  No/denies    Multiple Pain Sites  No       Capillary Blood Glucose: No results found for this or any previous visit (from the past 24 hour(s)).  Exercise Prescription Changes - 10/13/17 1200      Response to Exercise   Blood Pressure (Admit)  126/70    Blood Pressure (Exercise)  136/70    Blood Pressure (Exit)  116/60    Heart Rate (Admit)  72 bpm    Heart Rate (Exercise)  92 bpm    Heart Rate (Exit)  77 bpm    Oxygen Saturation (Admit)  96 %    Oxygen Saturation (Exercise)  94 %    Oxygen Saturation (Exit)  95 %    Rating of Perceived Exertion (Exercise)  13    Perceived Dyspnea (Exercise)  2    Duration  Progress to 45 minutes of aerobic exercise without signs/symptoms of physical distress    Intensity  THRR unchanged      Progression   Progression  Continue to progress workloads to maintain intensity without signs/symptoms of physical distress.      Resistance Training   Training Prescription  Yes    Weight  orange bands    Reps  10-15    Time  10 Minutes      Interval Training   Interval  Training  No      Oxygen   Oxygen  Continuous    Liters  6-8      Recumbant Bike   Level  3    Minutes  17      NuStep   Level  5    Minutes  17    METs  2.5      Track   Laps  12    Minutes  17       Social History   Tobacco Use  Smoking Status Former Smoker  . Packs/day: 1.50  . Years: 40.00  . Pack years: 60.00  . Types: Cigarettes  . Last attempt to quit: 03/18/2017  . Years since quitting: 0.5  Smokeless Tobacco Never Used    Goals Met:  Exercise tolerated well Strength training completed today  Goals Unmet:  Not Applicable  Comments: Service time is from 1030 to 1215    Dr. Rush Farmer is Medical Director for Pulmonary Rehab at [facility].

## 2017-10-13 NOTE — Progress Notes (Signed)
Pulmonary Individual Treatment Plan  Patient Details  Name: Kayla Mack MRN: [id number] Date of Birth: 29-Jun-1946 Referring Provider:     Pulmonary Rehab Walk Test from 07/07/2017 in [place]  Referring Provider  Dr. Lake Bells      Initial Encounter Date:    Pulmonary Rehab Walk Test from 07/07/2017 in [facility]  Date  07/12/17  Referring Provider  Dr. Lake Bells      Visit Diagnosis: Pulmonary hypertension ([place])  Patient's Home Medications on Admission:   Current Outpatient Medications:  .  acetaminophen (TYLENOL) 500 MG tablet, Take 1,000 mg by mouth 2 (two) times daily as needed for pain. , Disp: , Rfl:  .  Albuterol Sulfate (VENTOLIN HFA IN), Inhale 2 puffs into the lungs 4 (four) times daily as needed., Disp: , Rfl:  .  ANORO ELLIPTA 62.5-25 MCG/INH AEPB, Inhale 1 puff into the lungs daily., Disp: , Rfl:  .  bisoprolol (ZEBETA) 5 MG tablet, Take 1 tablet (5 mg total) by mouth daily., Disp: 30 tablet, Rfl: 0 .  buPROPion (WELLBUTRIN SR) 150 MG 12 hr tablet, Take 150 mg by mouth daily., Disp: , Rfl:  .  calcium-vitamin D (OSCAL WITH D) 500-200 MG-UNIT per tablet, Take 1 tablet by mouth 2 (two) times daily., Disp: , Rfl:  .  clonazePAM (KLONOPIN) 0.5 MG tablet, Take 1 tablet (0.5 mg total) by mouth 2 (two) times daily as needed for anxiety. (Patient taking differently: Take 0.5 mg by mouth daily as needed for anxiety. ), Disp: 10 tablet, Rfl: 0 .  clopidogrel (PLAVIX) 75 MG tablet, Take 1 tablet (75 mg total) by mouth daily., Disp: 30 tablet, Rfl: 0 .  feeding supplement, ENSURE ENLIVE, (ENSURE ENLIVE) LIQD, Take 237 mLs by mouth 2 (two) times daily between meals. (Patient not taking: Reported on 08/29/2017), Disp: 237 mL, Rfl: 12 .  fluticasone (FLOVENT HFA) 110 MCG/ACT inhaler, Inhale 2 puffs into the lungs 2 (two) times daily., Disp: 1 Inhaler, Rfl: 0 .  lovastatin (MEVACOR) 40 MG tablet, Take 40 mg by mouth every  morning., Disp: , Rfl:  .  nicotine (NICODERM CQ - DOSED IN MG/24 HOURS) 21 mg/24hr patch, Place 21 mg onto the skin daily., Disp: , Rfl:  .  PARoxetine (PAXIL) 20 MG tablet, Take 20 mg by mouth daily., Disp: , Rfl:  .  VENTOLIN HFA 108 (90 Base) MCG/ACT inhaler, , Disp: , Rfl:  .  Vitamin D, Ergocalciferol, (DRISDOL) 50000 units CAPS capsule, Take 50,000 Units by mouth every 7 (seven) days., Disp: , Rfl:   Past Medical History: Past Medical History:  Diagnosis Date  . Anxiety   . COPD (chronic obstructive pulmonary disease) ([place])   . Depression   . Hyperlipidemia   . Hypertension     Tobacco Use: Social History   Tobacco Use  Smoking Status Former Smoker  . Packs/day: 1.50  . Years: 40.00  . Pack years: 60.00  . Types: Cigarettes  . Last attempt to quit: 03/18/2017  . Years since quitting: 0.5  Smokeless Tobacco Never Used    Labs: Recent Chemical engineer    Labs for ITP Cardiac and Pulmonary Rehab Latest Ref Rng & Units 05/04/2017 05/05/2017 05/07/2017 05/09/2017 05/09/2017   Cholestrol 0 - 200 mg/dL 118 - - - -   LDLCALC 0 - 99 mg/dL 72 - - - -   HDL >40 mg/dL 31(L) - - - -   Trlycerides <150 mg/dL 76 - - - -  Hemoglobin A1c 4.8 - 5.6 % 5.8(H) - - - -   PHART 7.350 - 7.450 - 7.249(L) 7.311(L) 7.423 -   PCO2ART 32.0 - 48.0 mmHg - 53.9(H) 59.3(H) 56.6(H) -   HCO3 20.0 - 28.0 mmol/L 26.4 22.8 29.0(H) 36.9(H) 39.0(H)   TCO2 0 - 100 mmol/L 28 - - 39 41   ACIDBASEDEF 0.0 - 2.0 mmol/L - 3.4(H) - - -   O2SAT % 84.0 98.8 98.2 90.0 65.0      Capillary Blood Glucose: Lab Results  Component Value Date   GLUCAP 148 (H) 05/06/2017     Pulmonary Assessment Scores: Pulmonary Assessment Scores    Row Name 07/13/17 1405         ADL UCSD   ADL Phase  Entry     SOB Score total  55       CAT Score   CAT Score  23 Entry        Pulmonary Function Assessment: Pulmonary Function Assessment - 07/01/17 1055      Breath   Bilateral Breath Sounds  Clear;Decreased     Shortness of Breath  Yes;Limiting activity       Exercise Target Goals:    Exercise Program Goal: Individual exercise prescription set with THRR, safety & activity barriers. Participant demonstrates ability to understand and report RPE using BORG scale, to self-measure pulse accurately, and to acknowledge the importance of the exercise prescription.  Exercise Prescription Goal: Starting with aerobic activity 30 plus minutes a day, 3 days per week for initial exercise prescription. Provide home exercise prescription and guidelines that participant acknowledges understanding prior to discharge.  Activity Barriers & Risk Stratification: Activity Barriers & Cardiac Risk Stratification - 07/01/17 1034      Activity Barriers & Cardiac Risk Stratification   Activity Barriers  Deconditioning;Muscular Weakness;Shortness of Breath       6 Minute Walk: 6 Minute Walk    Row Name 07/12/17 0735         6 Minute Walk   Phase  Initial     Distance  1200 feet     Walk Time  6 minutes     # of Rest Breaks  0     MPH  2.27     METS  2.76     RPE  11     Perceived Dyspnea   1     Symptoms  No     Resting HR  77 bpm     Resting BP  149/80     Resting Oxygen Saturation   86 % 2 liters/88% on 4 liters     Exercise Oxygen Saturation  during 6 min walk  85 %     Max Ex. HR  100 bpm     Max Ex. BP  163/75     2 Minute Post BP  174/82       Interval HR   1 Minute HR  77     2 Minute HR  78     3 Minute HR  95     4 Minute HR  100     5 Minute HR  95     6 Minute HR  93     2 Minute Post HR  74     Interval Heart Rate?  Yes       Interval Oxygen   Interval Oxygen?  Yes     Baseline Oxygen Saturation %  86 %     1 Minute Oxygen  Saturation %  94 %     1 Minute Liters of Oxygen  4 L     2 Minute Oxygen Saturation %  91 %     2 Minute Liters of Oxygen  4 L     3 Minute Oxygen Saturation %  87 %     3 Minute Liters of Oxygen  4 L     4 Minute Oxygen Saturation %  85 %     4 Minute  Liters of Oxygen  6 L     5 Minute Oxygen Saturation %  86 %     5 Minute Liters of Oxygen  6 L     6 Minute Oxygen Saturation %  88 %     6 Minute Liters of Oxygen  6 L     2 Minute Post Oxygen Saturation %  97 %     2 Minute Post Liters of Oxygen  6 L        Oxygen Initial Assessment: Oxygen Initial Assessment - 07/12/17 0742      Initial 6 min Walk   Oxygen Used  Continuous;E-Tanks started on 2 liters and titrated up    Liters per minute  6      Program Oxygen Prescription   Program Oxygen Prescription  Continuous;E-Tanks    Liters per minute  6       Oxygen Re-Evaluation: Oxygen Re-Evaluation    Row Name 08/05/17 1213 08/29/17 1212 08/30/17 1616 09/20/17 0736 10/13/17 0630     Program Oxygen Prescription   Program Oxygen Prescription  Continuous;E-Tanks  Continuous;E-Tanks  -  Continuous;E-Tanks  Continuous;E-Tanks   Liters per minute  6  8  -  8  8   Comments  -  -  patient had to increase oxygen with ambulation today from 8 lpm to 10 lpm for desaturation to 86% on 8 lpm  patient now requires 10 liters for ambulation on the track  patient now requires 10 liters for ambulation on the track     Home Oxygen   Home Oxygen Device  E-Tanks  E-Tanks  -  E-Tanks  E-Tanks   Sleep Oxygen Prescription  Continuous  Continuous  -  Continuous  Continuous   Liters per minute  3  3  -  3  3   Home Exercise Oxygen Prescription  -  Continuous  -  Continuous  Continuous   Liters per minute  -  8  -  8 10 with brisk ambulation  8   Home at Rest Exercise Oxygen Prescription  -  Continuous  -  -  Continuous   Liters per minute  -  3  -  3  3   Compliance with Home Oxygen Use  -  -  -  Yes  Yes     Goals/Expected Outcomes   Short Term Goals  To learn and exhibit compliance with exercise, home and travel O2 prescription;To learn and understand importance of monitoring SPO2 with pulse oximeter and demonstrate accurate use of the pulse oximeter.;To learn and understand importance of maintaining  oxygen saturations>88%;To learn and demonstrate proper pursed lip breathing techniques or other breathing techniques.;To learn and demonstrate proper use of respiratory medications  To learn and exhibit compliance with exercise, home and travel O2 prescription;To learn and understand importance of monitoring SPO2 with pulse oximeter and demonstrate accurate use of the pulse oximeter.;To learn and understand importance of maintaining oxygen saturations>88%;To learn and demonstrate proper pursed lip breathing techniques  or other breathing techniques.;To learn and demonstrate proper use of respiratory medications  -  To learn and exhibit compliance with exercise, home and travel O2 prescription;To learn and understand importance of monitoring SPO2 with pulse oximeter and demonstrate accurate use of the pulse oximeter.;To learn and understand importance of maintaining oxygen saturations>88%;To learn and demonstrate proper pursed lip breathing techniques or other breathing techniques.;To learn and demonstrate proper use of respiratory medications  To learn and exhibit compliance with exercise, home and travel O2 prescription;To learn and understand importance of monitoring SPO2 with pulse oximeter and demonstrate accurate use of the pulse oximeter.;To learn and understand importance of maintaining oxygen saturations>88%;To learn and demonstrate proper pursed lip breathing techniques or other breathing techniques.;To learn and demonstrate proper use of respiratory medications   Long  Term Goals  Exhibits compliance with exercise, home and travel O2 prescription;Verbalizes importance of monitoring SPO2 with pulse oximeter and return demonstration;Maintenance of O2 saturations>88%;Exhibits proper breathing techniques, such as pursed lip breathing or other method taught during program session;Compliance with respiratory medication;Demonstrates proper use of MDI's  Exhibits compliance with exercise, home and travel O2  prescription;Verbalizes importance of monitoring SPO2 with pulse oximeter and return demonstration;Maintenance of O2 saturations>88%;Exhibits proper breathing techniques, such as pursed lip breathing or other method taught during program session;Compliance with respiratory medication;Demonstrates proper use of MDI's  -  Exhibits compliance with exercise, home and travel O2 prescription;Verbalizes importance of monitoring SPO2 with pulse oximeter and return demonstration;Maintenance of O2 saturations>88%;Exhibits proper breathing techniques, such as pursed lip breathing or other method taught during program session;Compliance with respiratory medication;Demonstrates proper use of MDI's  Exhibits compliance with exercise, home and travel O2 prescription;Verbalizes importance of monitoring SPO2 with pulse oximeter and return demonstration;Maintenance of O2 saturations>88%;Exhibits proper breathing techniques, such as pursed lip breathing or other method taught during program session;Compliance with respiratory medication;Demonstrates proper use of MDI's   Comments  patient demonstrates correct use of home o2 by wearing her oxygen into pulmonary rehab on correct dosage with correct equiptment.   we are in the process of helping patient recieve correct oxygen supply devices for her at home needs.  -  patient now has multiple large e tanks with carrier, a larger high liter flow concentrator with autofill capabilities.  patient now has multiple large e tanks with carrier, a larger high liter flow concentrator with autofill capabilities.   Goals/Expected Outcomes  patient will verbalize compliance with home O2  patient will verbalize compliance with home O2  -  patient will verbalize compliance with home O2  patient verbalizes complaince with home O2      Oxygen Discharge (Final Oxygen Re-Evaluation): Oxygen Re-Evaluation - 10/13/17 0630      Program Oxygen Prescription   Program Oxygen Prescription   Continuous;E-Tanks    Liters per minute  8    Comments  patient now requires 10 liters for ambulation on the track      Home Oxygen   Home Oxygen Device  E-Tanks    Sleep Oxygen Prescription  Continuous    Liters per minute  3    Home Exercise Oxygen Prescription  Continuous    Liters per minute  8    Home at Rest Exercise Oxygen Prescription  Continuous    Liters per minute  3    Compliance with Home Oxygen Use  Yes      Goals/Expected Outcomes   Short Term Goals  To learn and exhibit compliance with exercise, home and travel O2 prescription;To learn and understand  importance of monitoring SPO2 with pulse oximeter and demonstrate accurate use of the pulse oximeter.;To learn and understand importance of maintaining oxygen saturations>88%;To learn and demonstrate proper pursed lip breathing techniques or other breathing techniques.;To learn and demonstrate proper use of respiratory medications    Long  Term Goals  Exhibits compliance with exercise, home and travel O2 prescription;Verbalizes importance of monitoring SPO2 with pulse oximeter and return demonstration;Maintenance of O2 saturations>88%;Exhibits proper breathing techniques, such as pursed lip breathing or other method taught during program session;Compliance with respiratory medication;Demonstrates proper use of MDI's    Comments  patient now has multiple large e tanks with carrier, a larger high liter flow concentrator with autofill capabilities.    Goals/Expected Outcomes  patient verbalizes complaince with home O2       Initial Exercise Prescription: Initial Exercise Prescription - 07/12/17 0700      Date of Initial Exercise RX and Referring Provider   Date  07/12/17    Referring Provider  Dr. Lake Bells      Oxygen   Oxygen  Continuous    Liters  6      Recumbant Bike   Level  2    Minutes  17      NuStep   Level  2    Minutes  17    METs  1.3      Track   Laps  5    Minutes  17      Prescription Details    Frequency (times per week)  2    Duration  Progress to 45 minutes of aerobic exercise without signs/symptoms of physical distress      Intensity   THRR 40-80% of Max Heartrate  59-118    Ratings of Perceived Exertion  11-13    Perceived Dyspnea  0-4      Progression   Progression  Continue progressive overload as per policy without signs/symptoms or physical distress.      Resistance Training   Training Prescription  Yes    Weight  orange bands    Reps  10-15       Perform Capillary Blood Glucose checks as needed.  Exercise Prescription Changes: Exercise Prescription Changes    Row Name 07/14/17 1200 07/19/17 1200 07/21/17 1200 07/26/17 1300 08/02/17 1200     Response to Exercise   Blood Pressure (Admit)  102/60  118/62  124/70  140/68  124/64   Blood Pressure (Exercise)  152/64  140/60  132/72  138/62  130/70   Blood Pressure (Exit)  108/60  106/66  112/60  118/60  106/66   Heart Rate (Admit)  72 bpm  65 bpm  75 bpm  68 bpm  63 bpm   Heart Rate (Exercise)  107 bpm  84 bpm  122 bpm  87 bpm  84 bpm   Heart Rate (Exit)  74 bpm  70 bpm  74 bpm  68 bpm  67 bpm   Oxygen Saturation (Admit)  94 %  98 %  94 %  96 %  96 %   Oxygen Saturation (Exercise)  85 % O2 increased to 6L sat improved 88%  88 %  87 %  88 %  86 %   Oxygen Saturation (Exit)  95 %  97 %  95 %  98 %  91 %   Rating of Perceived Exertion (Exercise)  _0 Perceived Dyspnea (Exercise)  1  3  3  3  1   Duration  Progress to 45 minutes of aerobic exercise without signs/symptoms of physical distress  Progress to 45 minutes of aerobic exercise without signs/symptoms of physical distress  Progress to 45 minutes of aerobic exercise without signs/symptoms of physical distress  Progress to 45 minutes of aerobic exercise without signs/symptoms of physical distress  Progress to 45 minutes of aerobic exercise without signs/symptoms of physical distress   Intensity  Other (comment) 40-80% of HRR  Other (comment) 40-80%  of HRR  THRR unchanged  THRR unchanged  THRR unchanged     Progression   Progression  Continue to progress workloads to maintain intensity without signs/symptoms of physical distress.  Continue to progress workloads to maintain intensity without signs/symptoms of physical distress.  Continue to progress workloads to maintain intensity without signs/symptoms of physical distress.  Continue to progress workloads to maintain intensity without signs/symptoms of physical distress.  Continue to progress workloads to maintain intensity without signs/symptoms of physical distress.     Resistance Training   Training Prescription  Yes  Yes  Yes  Yes  Yes   Weight  orange bands  orange bands  orange bands  orange bands  orange bands   Reps  10-15  10-15  10-15  10-15  10-15   Time  10 Minutes  10 Minutes  10 Minutes  10 Minutes  10 Minutes     Oxygen   Oxygen  Continuous  Continuous  Continuous  Continuous  Continuous   Liters  _0 Recumbant Bike   Level  _1 Minutes  _2 NuStep   Level  _3 Minutes  _4 METs  2.2  2.2  2.4  2.9  2.7     Track   Laps  -  11  -  -  15   Minutes  -  17  -  -  17   Row Name 08/04/17 1258 08/09/17 1200 08/11/17 1200 08/16/17 1200 08/18/17 1200     Response to Exercise   Blood Pressure (Admit)  124/70  130/62  130/70  140/74  110/60   Blood Pressure (Exercise)  114/66  124/70  142/78  140/60  150/70   Blood Pressure (Exit)  134/70  120/70  118/70  126/64  126/66   Heart Rate (Admit)  72 bpm  72 bpm  68 bpm  68 bpm  79 bpm   Heart Rate (Exercise)  85 bpm  83 bpm  94 bpm  92 bpm  77 bpm   Heart Rate (Exit)  78 bpm  70 bpm  70 bpm  73 bpm  71 bpm   Oxygen Saturation (Admit)  91 %  93 %  93 %  94 %  94 %   Oxygen Saturation (Exercise)  90 %  86 %  88 %  89 %  92 %   Oxygen Saturation (Exit)  92 %  98 %  96 %  96 %  97 %   Rating of Perceived Exertion (Exercise)  _5 Perceived Dyspnea (Exercise)  _6 Duration  Progress  to 45 minutes of aerobic exercise without signs/symptoms of physical distress  Progress to 45 minutes of aerobic exercise without signs/symptoms of physical distress  Progress to 45 minutes of aerobic exercise without signs/symptoms of physical distress  Progress to 45 minutes of aerobic exercise without signs/symptoms of physical distress  Progress to 45 minutes of aerobic exercise without signs/symptoms of physical distress   Intensity  THRR unchanged  THRR unchanged  THRR unchanged  THRR unchanged  THRR unchanged     Progression   Progression  Continue to progress workloads to maintain intensity without signs/symptoms of physical distress.  Continue to progress workloads to maintain intensity without signs/symptoms of physical distress.  Continue to progress workloads to maintain intensity without signs/symptoms of physical distress.  Continue to progress workloads to maintain intensity without signs/symptoms of physical distress.  Continue to progress workloads to maintain intensity without signs/symptoms of physical distress.     Resistance Training   Training Prescription  Yes  Yes  Yes  Yes  Yes   Weight  orange bands  orange bands  orange bands  orange bands  orange bands   Reps  10-15  10-15  10-15  10-15  10-15   Time  10 Minutes  10 Minutes  10 Minutes  10 Minutes  10 Minutes     Interval Training   Interval Training  -  -  -  No  No     Oxygen   Oxygen  Continuous  Continuous  Continuous  Continuous  Continuous   Liters  _0 6-8  6-8     Recumbant Bike   Level  -  _1 Minutes  -  _2 NuStep   Level  3  3  -  3  -   Minutes  17  17  -  17  -   METs  3  2.7  -  2.8  -     Track   Laps  -  -  _3 Minutes  -  _4 Row Name 08/23/17 1200 08/25/17 1300 08/30/17 1200 09/06/17 1200 09/13/17 1200     Response to Exercise   Blood Pressure (Admit)  120/64  110/52   120/60  124/60  106/64   Blood Pressure (Exercise)  110/64  120/60  161/66  144/80  134/70   Blood Pressure (Exit)  120/60  118/70  110/70  104/54  104/64   Heart Rate (Admit)  70 bpm  75 bpm  71 bpm  67 bpm  66 bpm   Heart Rate (Exercise)  98 bpm  74 bpm  119 bpm  92 bpm  99 bpm   Heart Rate (Exit)  66 bpm  72 bpm  66 bpm  77 bpm  70 bpm   Oxygen Saturation (Admit)  99 %  92 %  95 %  96 %  94 %   Oxygen Saturation (Exercise)  91 %  95 %  84 % oxygen increased to 8L sat improved to 91  87 % increased to 90 with PLB  90 % increased to 90 with PLB   Oxygen Saturation (Exit)  93 %  92 %  98 %  89 %  93 %   Rating of Perceived Exertion (Exercise)  15  15  15  15  13   Perceived Dyspnea (Exercise)  _0 Duration  Progress to 45 minutes of aerobic exercise without signs/symptoms of physical distress  Progress to 45 minutes of aerobic exercise without signs/symptoms of physical distress  Progress to 45 minutes of aerobic exercise without signs/symptoms of physical distress  Progress to 45 minutes of aerobic exercise without signs/symptoms of physical distress  Progress to 45 minutes of aerobic exercise without signs/symptoms of physical distress   Intensity  THRR unchanged  THRR unchanged  THRR unchanged  THRR unchanged  THRR unchanged     Progression   Progression  Continue to progress workloads to maintain intensity without signs/symptoms of physical distress.  Continue to progress workloads to maintain intensity without signs/symptoms of physical distress.  Continue to progress workloads to maintain intensity without signs/symptoms of physical distress.  Continue to progress workloads to maintain intensity without signs/symptoms of physical distress.  Continue to progress workloads to maintain intensity without signs/symptoms of physical distress.     Resistance Training   Training Prescription  Yes  Yes  Yes  Yes  Yes   Weight  orange bands  orange bands  orange bands  orange bands  orange  bands   Reps  10-15  10-15  10-15  10-15  10-15   Time  10 Minutes  10 Minutes  10 Minutes  10 Minutes  10 Minutes     Interval Training   Interval Training  No  No  No  No  No     Oxygen   Oxygen  Continuous  Continuous  Continuous  Continuous  Continuous   Liters  6-8  6-8  6-8  -  -     Recumbant Bike   Level  _1 Minutes  34  _2 NuStep   Level  4  -  _3 Minutes  17  -  _4 METs  3.3  -  3.9  3.2  3.2     Track   Laps  -  _5 Minutes  -  17  177  17  17     Home Exercise Plan   Plans to continue exercise at  [employer] (comment)  -  -  -  -   Frequency  Add 3 additional days to program exercise sessions.  -  -  -  -   Row Name 09/20/17 1200 09/22/17 1200 09/29/17 1200 10/04/17 1500 10/06/17 1300     Response to Exercise   Blood Pressure (Admit)  104/60  100/50  118/60  130/60  122/68   Blood Pressure (Exercise)  112/78  132/76  118/70  -  120/60   Blood Pressure (Exit)  110/76  130/68  98/64  104/60  122/62   Heart Rate (Admit)  71 bpm  68 bpm  71 bpm  68 bpm  70 bpm   Heart Rate (Exercise)  115 bpm  81 bpm  97 bpm  103 bpm  89 bpm   Heart Rate (Exit)  67 bpm  67 bpm  67 bpm  73 bpm  70 bpm   Oxygen Saturation (Admit)  97 %  92 %  94 %  97 %  93 %   Oxygen  Saturation (Exercise)  92 %  96 %  89 %  88 %  93 %   Oxygen Saturation (Exit)  97 %  97 %  98 %  96 %  97 %   Rating of Perceived Exertion (Exercise)  _0 Perceived Dyspnea (Exercise)  _1 Duration  Progress to 45 minutes of aerobic exercise without signs/symptoms of physical distress  Progress to 45 minutes of aerobic exercise without signs/symptoms of physical distress  Progress to 45 minutes of aerobic exercise without signs/symptoms of physical distress  Progress to 45 minutes of aerobic exercise without signs/symptoms of physical distress  Progress to 45 minutes of aerobic exercise without signs/symptoms of physical  distress   Intensity  THRR unchanged  THRR unchanged  THRR unchanged  THRR unchanged  THRR unchanged     Progression   Progression  Continue to progress workloads to maintain intensity without signs/symptoms of physical distress.  Continue to progress workloads to maintain intensity without signs/symptoms of physical distress.  Continue to progress workloads to maintain intensity without signs/symptoms of physical distress.  Continue to progress workloads to maintain intensity without signs/symptoms of physical distress.  Continue to progress workloads to maintain intensity without signs/symptoms of physical distress.     Resistance Training   Training Prescription  Yes  Yes  Yes  Yes  Yes   Weight  orange bands  orange bands  orange bands  orange bands  orange bands   Reps  10-15  10-15  10-15  10-15  10-15   Time  10 Minutes  10 Minutes  10 Minutes  10 Minutes  10 Minutes     Interval Training   Interval Training  No  No  No  No  No     Oxygen   Oxygen  Continuous  Continuous  Continuous  Continuous  Continuous   Liters  6-8  6-8  6-8  6-8  6-8     Recumbant Bike   Level  3  -  _2 Minutes  17  -  _3 NuStep   Level  _4 Minutes  _5 METs  3.6  3.3  3.2  3.4  2     Track   Laps  _6 -   Minutes  _7 -      Exercise Comments: Exercise Comments    Row Name 08/23/17 1254           Exercise Comments  home exercise completed          Exercise Goals and Review: Exercise Goals    Row Name 07/01/17 1035             Exercise Goals   Increase Physical Activity  Yes       Intervention  Provide advice, education, support and counseling about physical activity/exercise needs.;Develop an individualized exercise prescription for aerobic and resistive training based on initial evaluation findings, risk stratification, comorbidities and participant's personal goals.       Expected Outcomes  Achievement of  increased cardiorespiratory fitness and enhanced flexibility, muscular endurance and strength shown through measurements of functional capacity and personal  statement of participant.       Increase Strength and Stamina  Yes       Intervention  Provide advice, education, support and counseling about physical activity/exercise needs.;Develop an individualized exercise prescription for aerobic and resistive training based on initial evaluation findings, risk stratification, comorbidities and participant's personal goals.       Expected Outcomes  Achievement of increased cardiorespiratory fitness and enhanced flexibility, muscular endurance and strength shown through measurements of functional capacity and personal statement of participant.       Able to understand and use rate of perceived exertion (RPE) scale  Yes       Intervention  Provide education and explanation on how to use RPE scale       Expected Outcomes  Short Term: Able to use RPE daily in rehab to express subjective intensity level;Long Term:  Able to use RPE to guide intensity level when exercising independently       Able to understand and use Dyspnea scale  Yes       Intervention  Provide education and explanation on how to use Dyspnea scale       Expected Outcomes  Short Term: Able to use Dyspnea scale daily in rehab to express subjective sense of shortness of breath during exertion;Long Term: Able to use Dyspnea scale to guide intensity level when exercising independently       Knowledge and understanding of Target Heart Rate Range (THRR)  Yes       Intervention  Provide education and explanation of THRR including how the numbers were predicted and where they are located for reference       Expected Outcomes  Short Term: Able to state/look up THRR;Long Term: Able to use THRR to govern intensity when exercising independently;Short Term: Able to use daily as guideline for intensity in rehab       Understanding of Exercise Prescription  Yes        Intervention  Provide education, explanation, and written materials on patient's individual exercise prescription       Expected Outcomes  Short Term: Able to explain program exercise prescription;Long Term: Able to explain home exercise prescription to exercise independently          Exercise Goals Re-Evaluation : Exercise Goals Re-Evaluation    Row Name 08/08/17 0720 09/01/17 1627 09/19/17 1624 10/06/17 0725       Exercise Goal Re-Evaluation   Exercise Goals Review  Increase Strength and Stamina;Increase Physical Activity;Able to understand and use rate of perceived exertion (RPE) scale;Able to understand and use Dyspnea scale;Knowledge and understanding of Target Heart Rate Range (THRR);Understanding of Exercise Prescription  Increase Strength and Stamina;Increase Physical Activity;Able to understand and use rate of perceived exertion (RPE) scale;Able to understand and use Dyspnea scale;Knowledge and understanding of Target Heart Rate Range (THRR);Understanding of Exercise Prescription  Increase Strength and Stamina;Increase Physical Activity;Able to understand and use rate of perceived exertion (RPE) scale;Able to understand and use Dyspnea scale;Knowledge and understanding of Target Heart Rate Range (THRR);Understanding of Exercise Prescription  Increase Strength and Stamina;Increase Physical Activity;Able to understand and use Dyspnea scale;Able to understand and use rate of perceived exertion (RPE) scale;Knowledge and understanding of Target Heart Rate Range (THRR);Understanding of Exercise Prescription    Comments  Patient is progressing well in program. She has a wonderful attitude and it motivated to work hard while she is here. She has walked up to 15 laps (200 ft each) in 15 minutes.   Patient is progressing well in  program. She has a wonderful attitude and it motivated to work hard while she is here. She has walked up to 15 laps (200 ft each) in 15 minutes.   Patient is progressing  well in program. She has a wonderful attitude and it motivated to work hard while she is here. METs place her in a "moderate" category. She has walked up to 16 laps (200 ft each) in 15 minutes.   Patient is progressing well in program. She has a wonderful attitude and it motivated to work hard while she is here. METs place her in a "moderate" category. She has walked up to 16 laps (200 ft each) in 15 minutes.     Expected Outcomes  Through exercise at rehab and at home the patient will increase physical activity, strength, and decrease shortness of breath.   Through exercise at rehab and at home, patient will increase strength and stamina making ADL's easier to perform. Patient will also have a better understanding of safe exercise and what they are capable to do outside of clinical supervision.  Through exercise at rehab and at home, patient will increase strength and stamina making ADL's easier to perform. Patient will also have a better understanding of safe exercise and what they are capable to do outside of clinical supervision.  Through exercise at rehab and at home, patient will increase strength and stamina making ADL's easier to perform. Patient will also have a better understanding of safe exercise and what they are capable to do outside of clinical supervision.       Discharge Exercise Prescription (Final Exercise Prescription Changes): Exercise Prescription Changes - 10/06/17 1300      Response to Exercise   Blood Pressure (Admit)  122/68    Blood Pressure (Exercise)  120/60    Blood Pressure (Exit)  122/62    Heart Rate (Admit)  70 bpm    Heart Rate (Exercise)  89 bpm    Heart Rate (Exit)  70 bpm    Oxygen Saturation (Admit)  93 %    Oxygen Saturation (Exercise)  93 %    Oxygen Saturation (Exit)  97 %    Rating of Perceived Exertion (Exercise)  13    Perceived Dyspnea (Exercise)  2    Duration  Progress to 45 minutes of aerobic exercise without signs/symptoms of physical distress     Intensity  THRR unchanged      Progression   Progression  Continue to progress workloads to maintain intensity without signs/symptoms of physical distress.      Resistance Training   Training Prescription  Yes    Weight  orange bands    Reps  10-15    Time  10 Minutes      Interval Training   Interval Training  No      Oxygen   Oxygen  Continuous    Liters  6-8      Recumbant Bike   Level  3    Minutes  17      NuStep   Level  5    Minutes  17    METs  2       Nutrition:  Target Goals: Understanding of nutrition guidelines, daily intake of sodium <1526m, cholesterol <2015m calories 30% from fat and 7% or less from saturated fats, daily to have 5 or more servings of fruits and vegetables.  Biometrics: Pre Biometrics - 07/01/17 1134      Pre Biometrics   Grip Strength  15 kg  Nutrition Therapy Plan and Nutrition Goals: Nutrition Therapy & Goals - 07/04/17 1212      Nutrition Therapy   Diet  Therapeutic Lifestyle Changes      Personal Nutrition Goals   Nutrition Goal  The pt will recognize symptoms that can interfere with adequate oral intake, such as shortness of breath, N/V, early satiety, fatigue, ability to secure and prepare food, taste and smell changes, chewing/swallowing difficulties, and/ or pain when eating.      Intervention Plan   Intervention  Prescribe, educate and counsel regarding individualized specific dietary modifications aiming towards targeted core components such as weight, hypertension, lipid management, diabetes, heart failure and other comorbidities.    Expected Outcomes  Short Term Goal: Understand basic principles of dietary content, such as calories, fat, sodium, cholesterol and nutrients.;Long Term Goal: Adherence to prescribed nutrition plan.       Nutrition Discharge: Rate Your Plate Scores: Nutrition Assessments - 07/04/17 1212      Rate Your Plate Scores   Pre Score  -- returned form; not completed       Nutrition  Goals Re-Evaluation:   Nutrition Goals Discharge (Final Nutrition Goals Re-Evaluation):   Psychosocial: Target Goals: Acknowledge presence or absence of significant depression and/or stress, maximize coping skills, provide positive support system. Participant is able to verbalize types and ability to use techniques and skills needed for reducing stress and depression.  Initial Review & Psychosocial Screening: Initial Psych Review & Screening - 07/01/17 1059      Initial Review   Current issues with  Current Depression;Current Anxiety/Panic      Family Dynamics   Good Support System?  No    Strains  Intra-family strains;Illness and family care strain      Barriers   Psychosocial barriers to participate in program  Psychosocial barriers identified (see note)      Screening Interventions   Interventions  Encouraged to exercise;Other (comment)    Comments  currently sees counselor weekly and more often as needed related to caregiver strain. also member of caregiver support group at [facility]       Quality of Life Scores:   PHQ-9: Recent Review Flowsheet Data    Depression screen [facility] 2/9 08/29/2017 07/27/2017 07/04/2017 07/01/2017 05/19/2017   Decreased Interest _0 Down, Depressed, Hopeless _1 PHQ - 2 Score _2 Altered sleeping 0 0 - 1 1   Tired, decreased energy 1 1 - 1 1   Change in appetite 0 1 - 1 1   Feeling bad or failure about yourself  0 0 - 1 1   Trouble concentrating 0 1 - 1 1   Moving slowly or fidgety/restless 0 0 - 0 1   Suicidal thoughts 0 0 - 0 0   PHQ-9 Score 3 5 - 8 10   Difficult doing work/chores Somewhat difficult - - Somewhat difficult Very difficult     Interpretation of Total Score  Total Score Depression Severity:  1-4 = Minimal depression, 5-9 = Mild depression, 10-14 = Moderate depression, 15-19 = Moderately severe depression, 20-27 = Severe depression   Psychosocial Evaluation and Intervention: Psychosocial Evaluation  - 07/01/17 1102      Psychosocial Evaluation & Interventions   Interventions  Stress management education;Encouraged to exercise with the program and follow exercise prescription    Expected Outcomes  psychosocial issues will not become a barrier to participation to pulmonary rehab  Continue Psychosocial Services   Follow up required by staff       Psychosocial Re-Evaluation: Psychosocial Re-Evaluation    [place] Name 08/05/17 1231 08/29/17 1235 09/20/17 0741 10/13/17 4536       Psychosocial Re-Evaluation   Current issues with  Current Stress Concerns  Current Stress Concerns  Current Stress Concerns  -    Comments  her stress concerns are being managed and are not barriers to her participation in pulmonary rehab  her stress concerns are being managed and are not barriers to her participation in pulmonary rehab  her stress concerns are being managed and are not barriers to her participation in pulmonary rehab  her stress concerns are being managed and are not barriers to her participation in pulmonary rehab    Expected Outcomes  patient will remain free from psychosocial barriers  patient will remain free from psychosocial barriers  patient will remain free from psychosocial barriers  patient will remain free from psychosocial barriers    Interventions  Encouraged to attend Pulmonary Rehabilitation for the exercise  Encouraged to attend Pulmonary Rehabilitation for the exercise  Encouraged to attend Pulmonary Rehabilitation for the exercise  Encouraged to attend Pulmonary Rehabilitation for the exercise    Continue Psychosocial Services   Follow up required by staff  Follow up required by staff  Follow up required by staff  Follow up required by staff    Comments  56 year old mothers unwillingness to accept help from agencies in her home.  -  87 year old mothers unwillingness to accept help from agencies in her home.  -      Initial Review   Source of Stress Concerns  Family  -  Family  Family        Psychosocial Discharge (Final Psychosocial Re-Evaluation): Psychosocial Re-Evaluation - 10/13/17 4680      Psychosocial Re-Evaluation   Comments  her stress concerns are being managed and are not barriers to her participation in pulmonary rehab    Expected Outcomes  patient will remain free from psychosocial barriers    Interventions  Encouraged to attend Pulmonary Rehabilitation for the exercise    Continue Psychosocial Services   Follow up required by staff      Initial Review   Source of Stress Concerns  Family       Education: Education Goals: Education classes will be provided on a weekly basis, covering required topics. Participant will state understanding/return demonstration of topics presented.  Learning Barriers/Preferences: Learning Barriers/Preferences - 07/01/17 1055      Learning Barriers/Preferences   Learning Barriers  None    Learning Preferences  Computer/Internet;Individual Instruction;Verbal Instruction;Written Material       Education Topics: Risk Factor Reduction:  -Group instruction that is supported by a PowerPoint presentation. Instructor discusses the definition of a risk factor, different risk factors for pulmonary disease, and how the heart and lungs work together.     Nutrition for Pulmonary Patient:  -Group instruction provided by PowerPoint slides, verbal discussion, and written materials to support subject matter. The instructor gives an explanation and review of healthy diet recommendations, which includes a discussion on weight management, recommendations for fruit and vegetable consumption, as well as protein, fluid, caffeine, fiber, sodium, sugar, and alcohol. Tips for eating when patients are short of breath are discussed.   PULMONARY REHAB OTHER RESPIRATORY from 09/22/2017 in [place]  Date  08/04/17  Educator  edna  Instruction Review Code  2- meets goals/outcomes  Pursed Lip Breathing:   -Group instruction that is supported by demonstration and informational handouts. Instructor discusses the benefits of pursed lip and diaphragmatic breathing and detailed demonstration on how to preform both.     Oxygen Safety:  -Group instruction provided by PowerPoint, verbal discussion, and written material to support subject matter. There is an overview of "What is Oxygen" and "Why do we need it".  Instructor also reviews how to create a safe environment for oxygen use, the importance of using oxygen as prescribed, and the risks of noncompliance. There is a brief discussion on traveling with oxygen and resources the patient may utilize.   PULMONARY REHAB OTHER RESPIRATORY from 09/22/2017 in [place]  Date  08/11/17  Educator  Truddie Crumble  Instruction Review Code  2- meets goals/outcomes      Oxygen Equipment:  -Group instruction provided by [employer] Staff utilizing handouts, written materials, and equipment demonstrations.   PULMONARY REHAB OTHER RESPIRATORY from 09/22/2017 in [place]  Date  09/15/17  Educator  [facility]  Instruction Review Code  2- meets goals/outcomes      Signs and Symptoms:  -Group instruction provided by written material and verbal discussion to support subject matter. Warning signs and symptoms of infection, stroke, and heart attack are reviewed and when to call the physician/911 reinforced. Tips for preventing the spread of infection discussed.   Advanced Directives:  -Group instruction provided by verbal instruction and written material to support subject matter. Instructor reviews Advanced Directive laws and proper instruction for filling out document.   Pulmonary Video:  -Group video education that reviews the importance of medication and oxygen compliance, exercise, good nutrition, pulmonary hygiene, and pursed lip and diaphragmatic breathing for the pulmonary patient.   PULMONARY  REHAB OTHER RESPIRATORY from 09/22/2017 in [place]  Date  07/14/17  Instruction Review Code  2- meets goals/outcomes      Exercise for the Pulmonary Patient:  -Group instruction that is supported by a PowerPoint presentation. Instructor discusses benefits of exercise, core components of exercise, frequency, duration, and intensity of an exercise routine, importance of utilizing pulse oximetry during exercise, safety while exercising, and options of places to exercise outside of rehab.     Pulmonary Medications:  -Verbally interactive group education provided by instructor with focus on inhaled medications and proper administration.   PULMONARY REHAB OTHER RESPIRATORY from 09/22/2017 in [place]  Date  08/18/17  Educator  Pharm D  Instruction Review Code  2- meets goals/outcomes      Anatomy and Physiology of the Respiratory System and Intimacy:  -Group instruction provided by PowerPoint, verbal discussion, and written material to support subject matter. Instructor reviews respiratory cycle and anatomical components of the respiratory system and their functions. Instructor also reviews differences in obstructive and restrictive respiratory diseases with examples of each. Intimacy, Sex, and Sexuality differences are reviewed with a discussion on how relationships can change when diagnosed with pulmonary disease. Common sexual concerns are reviewed.   MD DAY -A group question and answer session with a medical doctor that allows participants to ask questions that relate to their pulmonary disease state.   PULMONARY REHAB OTHER RESPIRATORY from 09/22/2017 in [place]  Date  07/26/17  Educator  Dr. Nelda Marseille  Instruction Review Code  2- meets goals/outcomes      OTHER EDUCATION -Group or individual verbal, written, or video instructions that support the  educational goals of the pulmonary rehab  program.   Knowledge Questionnaire Score: Knowledge Questionnaire Score - 07/13/17 1405      Knowledge Questionnaire Score   Pre Score  11/13       Core Components/Risk Factors/Patient Goals at Admission: Personal Goals and Risk Factors at Admission - 07/01/17 1056      Core Components/Risk Factors/Patient Goals on Admission   Improve shortness of breath with ADL's  Yes    Intervention  Provide education, individualized exercise plan and daily activity instruction to help decrease symptoms of SOB with activities of daily living.    Expected Outcomes  Short Term: Achieves a reduction of symptoms when performing activities of daily living.    Develop more efficient breathing techniques such as purse lipped breathing and diaphragmatic breathing; and practicing self-pacing with activity  Yes    Intervention  Provide education, demonstration and support about specific breathing techniuqes utilized for more efficient breathing. Include techniques such as pursed lipped breathing, diaphragmatic breathing and self-pacing activity.    Expected Outcomes  Short Term: Participant will be able to demonstrate and use breathing techniques as needed throughout daily activities.    Increase knowledge of respiratory medications and ability to use respiratory devices properly   Yes    Intervention  Provide education and demonstration as needed of appropriate use of medications, inhalers, and oxygen therapy.    Expected Outcomes  Short Term: Achieves understanding of medications use. Understands that oxygen is a medication prescribed by physician. Demonstrates appropriate use of inhaler and oxygen therapy.    Stress  Yes    Intervention  Offer individual and/or small group education and counseling on adjustment to heart disease, stress management and health-related lifestyle change. Teach and support self-help strategies.;Refer participants experiencing significant psychosocial distress to appropriate mental  health specialists for further evaluation and treatment. When possible, include family members and significant others in education/counseling sessions.    Expected Outcomes  Short Term: Participant demonstrates changes in health-related behavior, relaxation and other stress management skills, ability to obtain effective social support, and compliance with psychotropic medications if prescribed.;Long Term: Emotional wellbeing is indicated by absence of clinically significant psychosocial distress or social isolation.       Core Components/Risk Factors/Patient Goals Review:  Goals and Risk Factor Review    Row Name 08/05/17 1221 08/29/17 1228 09/20/17 0739 10/13/17 0631       Core Components/Risk Factors/Patient Goals Review   Personal Goals Review  Develop more efficient breathing techniques such as purse lipped breathing and diaphragmatic breathing and practicing self-pacing with activity.;Improve shortness of breath with ADL's;Increase knowledge of respiratory medications and ability to use respiratory devices properly.;Stress  Develop more efficient breathing techniques such as purse lipped breathing and diaphragmatic breathing and practicing self-pacing with activity.;Improve shortness of breath with ADL's;Increase knowledge of respiratory medications and ability to use respiratory devices properly.;Stress  Develop more efficient breathing techniques such as purse lipped breathing and diaphragmatic breathing and practicing self-pacing with activity.;Improve shortness of breath with ADL's;Increase knowledge of respiratory medications and ability to use respiratory devices properly.;Stress  Develop more efficient breathing techniques such as purse lipped breathing and diaphragmatic breathing and practicing self-pacing with activity.;Improve shortness of breath with ADL's;Increase knowledge of respiratory medications and ability to use respiratory devices properly.;Stress    Review  patient is doing well  in pulmonary rehab. She verbalizes an improvement in her shortness of breath and finds using pursed lip breathing extremely helpful and gives her the additional breath she needs to  do her ADLs. She learned alot about her medications on Dr. Elana Alm when she was able to ask and have explained by our medical director how her respiratory medications worked. her stress in ongoing but she states she is doing a better job managing it.  patient continues to work hard in pulmonary rehab. She struggles emotionally with having to increase her oxygen prescription to meet her exercise demand. She is amazed as to how much more energy she has when she wears the correct ammount of oxygen required to keep her sats greater than 88-90%. She utilizes PLB independently and has taken a class with the pharmacist on pulmonary medication. She feels her shortness of breath is improving but only when wearing her oxygen. Her stress level is improving as we are resolving her pulmonary barriers to exercise.  patient is making significant progress in pulmoary rehab now that she does not have the stress of not having adequate oxygen at home. Her shortness of breath is improving with her ADLs, especially walking her dog. She is using PLB independently without coaching and states now that her oxygen issues are settles her stress level has minimized. She continues to manage the stress related to her 18f year old mother gracefully.  Patient continues to make progress in pulmonary rehab. She will graduate after completing 3 more sessions. She feels she has been sucessful in completing the program. She states she is able to complete her adls with less shortness of breath and her stamina has increased significantly. This has alleviated some of her stress along with knowing that her mother is now accepting help in her home. Have talked with JPetinaabout continuing her exercise in the maintenance program post graduation.    Expected Outcomes  see admission  outcomes  see admission outcomes  see admission outcomes  see admission outcomes       Core Components/Risk Factors/Patient Goals at Discharge (Final Review):  Goals and Risk Factor Review - 10/13/17 0631      Core Components/Risk Factors/Patient Goals Review   Personal Goals Review  Develop more efficient breathing techniques such as purse lipped breathing and diaphragmatic breathing and practicing self-pacing with activity.;Improve shortness of breath with ADL's;Increase knowledge of respiratory medications and ability to use respiratory devices properly.;Stress    Review  Patient continues to make progress in pulmonary rehab. She will graduate after completing 3 more sessions. She feels she has been sucessful in completing the program. She states she is able to complete her adls with less shortness of breath and her stamina has increased significantly. This has alleviated some of her stress along with knowing that her mother is now accepting help in her home. Have talked with JCaelanabout continuing her exercise in the maintenance program post graduation.    Expected Outcomes  see admission outcomes       ITP Comments:   Comments: Patient has completed 21 sessions since admission to pulmonary rehab.

## 2017-10-17 ENCOUNTER — Ambulatory Visit (INDEPENDENT_AMBULATORY_CARE_PROVIDER_SITE_OTHER): Payer: Medicare Other | Admitting: Orthopaedic Surgery

## 2017-10-17 ENCOUNTER — Other Ambulatory Visit (INDEPENDENT_AMBULATORY_CARE_PROVIDER_SITE_OTHER): Payer: Self-pay

## 2017-10-17 ENCOUNTER — Encounter (INDEPENDENT_AMBULATORY_CARE_PROVIDER_SITE_OTHER): Payer: Self-pay | Admitting: Orthopaedic Surgery

## 2017-10-17 DIAGNOSIS — M79641 Pain in right hand: Secondary | ICD-10-CM

## 2017-10-17 DIAGNOSIS — G5601 Carpal tunnel syndrome, right upper limb: Secondary | ICD-10-CM

## 2017-10-17 DIAGNOSIS — M65341 Trigger finger, right ring finger: Secondary | ICD-10-CM | POA: Diagnosis not present

## 2017-10-17 MED ORDER — METHYLPREDNISOLONE ACETATE 40 MG/ML IJ SUSP
40.0000 mg | INTRAMUSCULAR | Status: AC | PRN
  Administered 2017-10-17: 40 mg

## 2017-10-17 MED ORDER — LIDOCAINE HCL 1 % IJ SOLN
1.0000 mL | INTRAMUSCULAR | Status: AC | PRN
  Administered 2017-10-17: 1 mL

## 2017-10-17 NOTE — Progress Notes (Signed)
Office Visit Note   Patient: Kayla Mack           Date of Birth: 02-08-1946           MRN: [id number] Visit Date: 10/17/2017              Requested by: Jani Gravel, MD [address], [place] [address] PCP: Jani Gravel, MD   Assessment & Plan: Visit Diagnoses:  1. Carpal tunnel syndrome, right upper limb   2. Trigger finger, right ring finger     Plan: I would like to obtain nerve conduction studies of the right upper extremity to assess the degree of carpal tunnel syndrome that she may have so we can make a plan about whether or not surgery will be helpful.  Also offered her injection of a steroid over the right ring finger A1 pulley and she tolerated this well.  We will see her back in 2 weeks and hopefully the nerve studies will have been done and we can come up with the treatment plan.  All questions and concerns were answered and addressed.  Follow-Up Instructions: Return in about 2 weeks (around 10/31/2017).   Orders:  Orders Placed This Encounter  Procedures  . Hand/UE Inj   No orders of the defined types were placed in this encounter.     Procedures: Hand/UE Inj: R ring A1 for trigger finger on 10/17/2017 3:59 PM Medications: 1 mL lidocaine 1 %; 40 mg methylPREDNISolone acetate 40 MG/ML      Clinical Data: No additional findings.   Subjective: Chief Complaint  Patient presents with  . Right Hand - Pain  The patient is a very pleasant right-hand-dominant female who comes in with right hand numbness this been going on for a while now mainly in her   Thumb, index finger and middle finger.  She wears a splint at night.  It does wake her up at night with numbness and tingling.  She started to drop objects.  She is somewhat is on chronic oxygen therapy for COPD.  She is also been having triggering in her right ring finger.  She has had trigger finger before of the fingers but it usually goes away.  This has been going on for a long period  time is gone away.  She denies any specific injuries.  HPI  Review of Systems She currently denies any headache, chest pain, shortness of breath, fever, chills, nausea, vomiting.  Objective: Vital Signs: There were no vitals taken for this visit.  Physical Exam She is alert and oriented x3 and in no acute distress Ortho Exam Examination of her right hand does show active triggering and pain over the A1 pulley of the ring finger.  She also has weak grip and pinch strength.  She has subjective numbness in the median nerve distribution.  Her hand does appear weak with some atrophy in the muscles.  She has a positive Tinel sign at the transverse carpal ligament area. Specialty Comments:  No specialty comments available.  Imaging: No results found.   PMFS History: Patient Active Problem List   Diagnosis Date Noted  . Carpal tunnel syndrome, right upper limb 10/17/2017  . Trigger finger, right ring finger 10/17/2017  . Pulmonary hypertension ([place])   . SOB (shortness of breath)   . Hyperkalemia   . Elevated troponin   . COPD (chronic obstructive pulmonary disease) ([place]) 05/04/2017  . Acute respiratory distress   . Tachycardia   . Hypokalemia   .  Hypotension   . Tobacco use disorder 06/04/2013  . T12 burst fracture ([place]) 06/03/2013  . Left radius fracture 06/03/2013  . Right calcaneal fracture 06/03/2013  . Fall from ladder 06/03/2013  . Osteopenia of the elderly 06/03/2013   Past Medical History:  Diagnosis Date  . Anxiety   . COPD (chronic obstructive pulmonary disease) ([place])   . Depression   . Hyperlipidemia   . Hypertension     Family History  Problem Relation Age of Onset  . Heart disease Mother   . Heart failure Mother   . Heart disease Father        cabg  . Heart disease Sister   . Diabetes Sister     Past Surgical History:  Procedure Laterality Date  . APPENDECTOMY    . RIGHT/LEFT HEART CATH AND CORONARY ANGIOGRAPHY N/A 05/09/2017   Procedure: Right/Left  Heart Cath and Coronary Angiography;  Surgeon: [name], MD;  Location: [facility];  Service: Cardiovascular;  Laterality: N/A;  . SHOULDER SURGERY Right Pin  . TONSILLECTOMY     Social History   Occupational History  . Not on file  Tobacco Use  . Smoking status: Former Smoker    Packs/day: 1.50    Years: 40.00    Pack years: 60.00    Types: Cigarettes    Last attempt to quit: 03/18/2017    Years since quitting: 0.5  . Smokeless tobacco: Never Used  Substance and Sexual Activity  . Alcohol use: Yes    Comment: liquor   . Drug use: No  . Sexual activity: Not Currently

## 2017-10-20 ENCOUNTER — Encounter (HOSPITAL_COMMUNITY)
Admission: RE | Admit: 2017-10-20 | Discharge: 2017-10-20 | Disposition: A | Discharge status: Home / Self Care | Payer: Medicare Other | Source: Ambulatory Visit | Attending: Internal Medicine | Admitting: Internal Medicine

## 2017-10-20 VITALS — Wt 127.4 lb

## 2017-10-20 DIAGNOSIS — I272 Pulmonary hypertension, unspecified: Secondary | ICD-10-CM | POA: Diagnosis not present

## 2017-10-20 NOTE — Progress Notes (Signed)
Daily Session Note  Patient Details  Name: Kayla Mack MRN: [id number] Date of Birth: 02/15/1946 Referring Provider:     Pulmonary Rehab Walk Test from 07/07/2017 in [facility]  Referring Provider  Dr. McQuaid      Encounter Date: 10/20/2017  Check In: Session Check In - 10/20/17 1110      Check-In   Location  [facility]    Staff Present  Lisa Hughes, RN;Portia Payne, RN, BSN; , MS, ACSM RCEP, Exercise Physiologist;Joan Behrens, RN, BSN    Supervising physician immediately available to respond to emergencies  [facility] immediately available    Physician(s)  Dr. Vann    Medication changes reported      No    Fall or balance concerns reported     No    Tobacco Cessation  No Change    Warm-up and Cool-down  Performed as group-led instruction    Resistance Training Performed  Yes    VAD Patient?  No      Pain Assessment   Currently in Pain?  No/denies    Multiple Pain Sites  No       Capillary Blood Glucose: No results found for this or any previous visit (from the past 24 hour(s)).    Social History   Tobacco Use  Smoking Status Former Smoker  . Packs/day: 1.50  . Years: 40.00  . Pack years: 60.00  . Types: Cigarettes  . Last attempt to quit: 03/18/2017  . Years since quitting: 0.5  Smokeless Tobacco Never Used    Goals Met:  Exercise tolerated well No report of cardiac concerns or symptoms Strength training completed today  Goals Unmet:  Not Applicable  Comments: Service time is from 10:30a to 12:30p    Dr. Wesam G. Yacoub is Medical Director for Pulmonary Rehab at [facility]. 

## 2017-10-24 DIAGNOSIS — J449 Chronic obstructive pulmonary disease, unspecified: Secondary | ICD-10-CM | POA: Diagnosis not present

## 2017-10-24 DIAGNOSIS — E7800 Pure hypercholesterolemia, unspecified: Secondary | ICD-10-CM | POA: Diagnosis not present

## 2017-10-24 DIAGNOSIS — Z5181 Encounter for therapeutic drug level monitoring: Secondary | ICD-10-CM | POA: Diagnosis not present

## 2017-10-24 DIAGNOSIS — Z79899 Other long term (current) drug therapy: Secondary | ICD-10-CM | POA: Diagnosis not present

## 2017-10-24 DIAGNOSIS — E559 Vitamin D deficiency, unspecified: Secondary | ICD-10-CM | POA: Diagnosis not present

## 2017-10-24 DIAGNOSIS — F419 Anxiety disorder, unspecified: Secondary | ICD-10-CM | POA: Diagnosis not present

## 2017-10-24 DIAGNOSIS — I10 Essential (primary) hypertension: Secondary | ICD-10-CM | POA: Diagnosis not present

## 2017-10-25 ENCOUNTER — Encounter (HOSPITAL_COMMUNITY)
Admission: RE | Admit: 2017-10-25 | Discharge: 2017-10-25 | Disposition: A | Discharge status: Home / Self Care | Payer: Medicare Other | Source: Ambulatory Visit | Attending: Internal Medicine | Admitting: Internal Medicine

## 2017-10-25 DIAGNOSIS — I272 Pulmonary hypertension, unspecified: Secondary | ICD-10-CM | POA: Diagnosis not present

## 2017-10-25 DIAGNOSIS — F329 Major depressive disorder, single episode, unspecified: Secondary | ICD-10-CM | POA: Diagnosis not present

## 2017-10-25 DIAGNOSIS — F4323 Adjustment disorder with mixed anxiety and depressed mood: Secondary | ICD-10-CM | POA: Diagnosis not present

## 2017-10-27 ENCOUNTER — Encounter (HOSPITAL_COMMUNITY): Payer: Medicare Other

## 2017-10-27 ENCOUNTER — Encounter (HOSPITAL_COMMUNITY): Payer: Self-pay | Admitting: *Deleted

## 2017-10-28 ENCOUNTER — Other Ambulatory Visit: Payer: Self-pay

## 2017-10-28 NOTE — Patient Outreach (Signed)
[facility]) Care Management  10/28/2017  Kayla Mack 02-25-1946 [id number]   Telephone call placed to the patient for monthly assessment. No answer. Unable to leave a message.  Plan: [facility] will make an outreach attempt in the month of February.  Lazaro Arms RN, BSN, [place] Direct Dial:  [phone] Fax: [phone]

## 2017-10-28 NOTE — Patient Outreach (Signed)
[facility]) Care Management  10/28/2017  Kayla Mack 12-May-1946 [id number]

## 2017-10-31 ENCOUNTER — Ambulatory Visit (INDEPENDENT_AMBULATORY_CARE_PROVIDER_SITE_OTHER): Payer: Medicare Other | Admitting: Orthopaedic Surgery

## 2017-11-01 ENCOUNTER — Encounter (HOSPITAL_COMMUNITY): Payer: Medicare Other

## 2017-11-01 DIAGNOSIS — F329 Major depressive disorder, single episode, unspecified: Secondary | ICD-10-CM | POA: Diagnosis not present

## 2017-11-01 DIAGNOSIS — F4323 Adjustment disorder with mixed anxiety and depressed mood: Secondary | ICD-10-CM | POA: Diagnosis not present

## 2017-11-02 DIAGNOSIS — D1801 Hemangioma of skin and subcutaneous tissue: Secondary | ICD-10-CM | POA: Diagnosis not present

## 2017-11-02 DIAGNOSIS — D485 Neoplasm of uncertain behavior of skin: Secondary | ICD-10-CM | POA: Diagnosis not present

## 2017-11-02 DIAGNOSIS — Z85828 Personal history of other malignant neoplasm of skin: Secondary | ICD-10-CM | POA: Diagnosis not present

## 2017-11-02 DIAGNOSIS — D225 Melanocytic nevi of trunk: Secondary | ICD-10-CM | POA: Diagnosis not present

## 2017-11-02 DIAGNOSIS — L814 Other melanin hyperpigmentation: Secondary | ICD-10-CM | POA: Diagnosis not present

## 2017-11-02 DIAGNOSIS — L309 Dermatitis, unspecified: Secondary | ICD-10-CM | POA: Diagnosis not present

## 2017-11-03 ENCOUNTER — Encounter (INDEPENDENT_AMBULATORY_CARE_PROVIDER_SITE_OTHER): Payer: Self-pay | Admitting: Physical Medicine and Rehabilitation

## 2017-11-03 ENCOUNTER — Ambulatory Visit (INDEPENDENT_AMBULATORY_CARE_PROVIDER_SITE_OTHER): Payer: Medicare Other | Admitting: Physical Medicine and Rehabilitation

## 2017-11-03 DIAGNOSIS — R202 Paresthesia of skin: Secondary | ICD-10-CM | POA: Diagnosis not present

## 2017-11-03 NOTE — Progress Notes (Deleted)
Pt states pain in right arm with right hand with numbness in fingers (thumb, pointer, & middle). Pt states pain and numbness has been going on for about 3 months. Pt states she wears a brace on right hand and that eases the pain, and exercises from PT for pulmonary makes it worse. Pt is Rt hand dominant. Pt states she cannot write or do normal activities with that hand. No creams or Lotions.

## 2017-11-04 NOTE — Progress Notes (Signed)
Kayla Mack - 72 y.o. female MRN [id number]  Date of birth: Feb 02, 1946  Office Visit Note: Visit Date: 11/03/2017 PCP: Jani Gravel, MD Referred by: Jani Gravel, MD  Subjective: Chief Complaint  Patient presents with  . Right Hand - Numbness  . Right Arm - Pain   HPI: Kayla Mack is a 72 year old right-hand-dominant female who comes in today at the request of Dr. Ninfa Linden for electrodiagnostic study of the right upper limb.  She reports 3 months of severe worsening pain and numbness in the right hand thumb index and middle fingers in a pretty classic median nerve distribution.  Left-sided complaints.  He does wear a brace on the right hand and this has eased reports that pulmonary rehab exercises that she does exacerbate the pain.  COPD and is on oxygen.  Ports that she has difficulty writing and buttoning and really doing any.  She has no prior history of electrodiagnostic studies or the carpal tunnel release.  She denies frank radicular pain.  She is not a diabetic.  Dr. Ninfa Linden has also treated her for trigger finger with injections.    ROS Otherwise per HPI.  Assessment & Plan: Visit Diagnoses:  1. Paresthesia of skin     Plan: No additional findings.  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 **With appropriate decompression she would likely regain some strength through reinnervation and sprouting but will most likely have permanent decreased sensation.  There is no significant electrodiagnostic evidence of any other focal nerve entrapment, brachial plexopathy or generalized peripheral neuropathy.   Recommendations: 1.  Follow-up with referring physician. 2.  Continue current management of symptoms. 3.  Suggest surgical  evaluation.   Meds & Orders: No orders of the defined types were placed in this encounter.   Orders Placed This Encounter  Procedures  . NCV with EMG (electromyography)    Follow-up: Return for Dr. Ninfa Linden.   Procedures: No procedures performed  EMG & NCV Findings: Evaluation of the right median motor nerve showed prolonged distal onset latency (11.7 ms), reduced amplitude (0.1 mV), and decreased conduction velocity (Elbow-Wrist, 30 m/s).  The right median (across palm) sensory nerve showed no response (Wrist) and prolonged distal peak latency (Palm, 4.8 ms).  The right ulnar sensory nerve showed reduced amplitude (5.8 V).  All remaining nerves (as indicated in the following tables) were within normal limits.    Needle evaluation of the right abductor pollicis brevis muscle showed decreased insertional activity, widespread spontaneous activity, increased motor unit amplitude, and diminished recruitment.  All remaining muscles (as indicated in the following table) showed no evidence of electrical instability.    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 **With appropriate decompression she would likely regain some strength through reinnervation and sprouting but will most likely have permanent decreased sensation.  There is no significant electrodiagnostic evidence of any other focal nerve entrapment, brachial plexopathy or generalized peripheral neuropathy.   Recommendations: 1.  Follow-up with referring physician. 2.  Continue current management of symptoms. 3.  Suggest surgical evaluation.    Nerve Conduction Studies Anti Sensory Summary Table  Stim Site NR Peak (ms) Norm Peak (ms) P-T Amp (V) Norm P-T Amp Site1 Site2 Delta-P (ms)  Dist (cm) Vel (m/s) Norm Vel (m/s)  Right Median Acr Palm Anti Sensory (2nd Digit)  34C  Wrist *NR  <3.6  >10 Wrist Palm  0.0    Palm    *4.8 <2.0 11.8         Right Radial Anti Sensory (Base 1st Digit)  33.9C  Wrist    2.8 <3.1 9.2  Wrist Base 1st Digit 2.8 0.0    Right Ulnar Anti Sensory (5th Digit)  34.1C  Wrist    3.6 <3.7 *5.8 >15.0 Wrist 5th Digit 3.6 14.0 39 >38  B Elbow    6.3  48.6  B Elbow Wrist 2.7 0.0  >47   Motor Summary Table   Stim Site NR Onset (ms) Norm Onset (ms) O-P Amp (mV) Norm O-P Amp Site1 Site2 Delta-0 (ms) Dist (cm) Vel (m/s) Norm Vel (m/s)  Right Median Motor (Abd Poll Brev)  33.9C  Wrist    *11.7 <4.2 *0.1 >5 Elbow Wrist 6.7 20.0 *30 >50  Elbow    18.4  0.1         Right Ulnar Motor (Abd Dig Min)  33.8C  Wrist    2.8 <4.2 5.0 >3 B Elbow Wrist 3.3 18.5 56 >53  B Elbow    6.1  5.0  A Elbow B Elbow 1.3 9.0 69 >53  A Elbow    7.4  4.9          EMG   Side Muscle Nerve Root Ins Act Fibs Psw Amp Dur Poly Recrt Int Fraser Din Comment  Right Abd Poll Brev Median C8-T1 *Decr *4+ *4+ *Incr Nml 0 *Reduced Nml   Right 1stDorInt Ulnar C8-T1 _0  0 Nml Nml     Nerve Conduction Studies Anti Sensory Left/Right Comparison   Stim Site L Lat (ms) R Lat (ms) L-R Lat (ms) L Amp (V) R Amp (V) L-R Amp (%) Site1 Site2 L Vel (m/s) R Vel (m/s) L-R Vel (m/s)  Median Acr Palm Anti Sensory (2nd Digit)  34C  Wrist       Wrist Palm     Palm  *4.8   11.8        Radial Anti Sensory (Base 1st Digit)  33.9C  Wrist  2.8   9.2  Wrist Base 1st Digit     Ulnar Anti Sensory (5th Digit)  34.1C  Wrist  3.6   *5.8  Wrist 5th Digit  39   B Elbow  6.3   48.6  B Elbow Wrist      Motor Left/Right Comparison   Stim Site L Lat (ms) R Lat (ms) L-R Lat (ms) L Amp (mV) R Amp (mV) L-R Amp (%) Site1 Site2 L Vel (m/s) R Vel (m/s) L-R Vel (m/s)  Median Motor (Abd Poll Brev)  33.9C  Wrist  *11.7   *0.1  Elbow Wrist  *30   Elbow  18.4   0.1        Ulnar Motor (Abd Dig Min)  33.8C    Wrist  2.8   5.0  B Elbow Wrist  56   B Elbow  6.1   5.0  A Elbow B Elbow  69   A Elbow  7.4   4.9           Waveforms:            Clinical History: No specialty  comments available.  She reports that she quit smoking about 7 months ago. Her smoking use included cigarettes. She has a 60.00 pack-year smoking history. she has never used smokeless tobacco.  Recent Labs    05/04/17 1050  HGBA1C 5.8*    Objective:  VS:  HT:    WT:   BMI:     BP:   HR: bpm  TEMP: ( )  RESP:  Physical Exam  Musculoskeletal:  Inspection reveals atrophy of the right APB compared to the left but no atrophy of the bilateral FDI or hand intrinsics. There is no swelling, color changes, allodynia or dystrophic changes. There is 5 out of 5 strength in the bilateral wrist extension, finger abduction and long finger flexion but 3 out of 5 strength with the right APB. There is impaired sensation to light touch in the median nerve distribution on the right. There is a negative Hoffmann's test bilaterally.    Ortho Exam Imaging: No results found.  Past Medical/Family/Surgical/Social History: Medications & Allergies reviewed per EMR Patient Active Problem List   Diagnosis Date Noted  . Carpal tunnel syndrome, right upper limb 10/17/2017  . Trigger finger, right ring finger 10/17/2017  . Pulmonary hypertension ([place])   . SOB (shortness of breath)   . Hyperkalemia   . Elevated troponin   . COPD (chronic obstructive pulmonary disease) ([place]) 05/04/2017  . Acute respiratory distress   . Tachycardia   . Hypokalemia   . Hypotension   . Tobacco use disorder 06/04/2013  . T12 burst fracture ([place]) 06/03/2013  . Left radius fracture 06/03/2013  . Right calcaneal fracture 06/03/2013  . Fall from ladder 06/03/2013  . Osteopenia of the elderly 06/03/2013   Past Medical History:  Diagnosis Date  . Anxiety   . COPD (chronic obstructive pulmonary disease) ([place])   . Depression   . Hyperlipidemia   .  Hypertension    Family History  Problem Relation Age of Onset  . Heart disease Mother   . Heart failure Mother   . Heart disease Father        cabg  . Heart disease Sister   . Diabetes Sister    Past Surgical History:  Procedure Laterality Date  . APPENDECTOMY    . RIGHT/LEFT HEART CATH AND CORONARY ANGIOGRAPHY N/A 05/09/2017   Procedure: Right/Left Heart Cath and Coronary Angiography;  Surgeon: [name], MD;  Location: [facility];  Service: Cardiovascular;  Laterality: N/A;  . SHOULDER SURGERY Right Pin  . TONSILLECTOMY     Social History   Occupational History  . Not on file  Tobacco Use  . Smoking status: Former Smoker    Packs/day: 1.50    Years: 40.00    Pack years: 60.00    Types: Cigarettes    Last attempt to quit: 03/18/2017    Years since quitting: 0.6  . Smokeless tobacco: Never Used  Substance and Sexual Activity  . Alcohol use: Yes    Comment: liquor   . Drug use: No  . Sexual activity: Not Currently

## 2017-11-04 NOTE — Procedures (Signed)
EMG & NCV Findings: Evaluation of the right median motor nerve showed prolonged distal onset latency (11.7 ms), reduced amplitude (0.1 mV), and decreased conduction velocity (Elbow-Wrist, 30 m/s).  The right median (across palm) sensory nerve showed no response (Wrist) and prolonged distal peak latency (Palm, 4.8 ms).  The right ulnar sensory nerve showed reduced amplitude (5.8 V).  All remaining nerves (as indicated in the following tables) were within normal limits.    Needle evaluation of the right abductor pollicis brevis muscle showed decreased insertional activity, widespread spontaneous activity, increased motor unit amplitude, and diminished recruitment.  All remaining muscles (as indicated in the following table) showed no evidence of electrical instability.    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 **With appropriate decompression she would likely regain some strength through reinnervation and sprouting but will most likely have permanent decreased sensation.  There is no significant electrodiagnostic evidence of any other focal nerve entrapment, brachial plexopathy or generalized peripheral neuropathy.   Recommendations: 1.  Follow-up with referring physician. 2.  Continue current management of symptoms. 3.  Suggest surgical evaluation.    Nerve Conduction Studies Anti Sensory Summary Table   Stim Site NR Peak (ms) Norm Peak (ms) P-T Amp (V) Norm P-T Amp Site1 Site2 Delta-P (ms) Dist (cm) Vel (m/s) Norm Vel (m/s)  Right Median Acr Palm Anti Sensory (2nd Digit)  34C  Wrist *NR  <3.6  >10 Wrist Palm  0.0    Palm    *4.8 <2.0 11.8         Right Radial Anti Sensory (Base 1st Digit)  33.9C  Wrist    2.8 <3.1 9.2  Wrist Base 1st  Digit 2.8 0.0    Right Ulnar Anti Sensory (5th Digit)  34.1C  Wrist    3.6 <3.7 *5.8 >15.0 Wrist 5th Digit 3.6 14.0 39 >38  B Elbow    6.3  48.6  B Elbow Wrist 2.7 0.0  >47   Motor Summary Table   Stim Site NR Onset (ms) Norm Onset (ms) O-P Amp (mV) Norm O-P Amp Site1 Site2 Delta-0 (ms) Dist (cm) Vel (m/s) Norm Vel (m/s)  Right Median Motor (Abd Poll Brev)  33.9C  Wrist    *11.7 <4.2 *0.1 >5 Elbow Wrist 6.7 20.0 *30 >50  Elbow    18.4  0.1         Right Ulnar Motor (Abd Dig Min)  33.8C  Wrist    2.8 <4.2 5.0 >3 B Elbow Wrist 3.3 18.5 56 >53  B Elbow    6.1  5.0  A Elbow B Elbow 1.3 9.0 69 >53  A Elbow    7.4  4.9          EMG   Side Muscle Nerve Root Ins Act Fibs Psw Amp Dur Poly Recrt Int Fraser Din Comment  Right Abd Poll Brev Median C8-T1 *Decr *4+ *4+ *Incr Nml 0 *Reduced Nml   Right 1stDorInt Ulnar C8-T1 _0  0 Nml Nml     Nerve Conduction Studies Anti Sensory Left/Right Comparison   Stim Site L Lat (ms) R Lat (ms) L-R Lat (ms) L Amp (V) R Amp (V) L-R Amp (%) Site1 Site2 L Vel (m/s) R Vel (m/s) L-R Vel (m/s)  Median Acr Palm Anti Sensory (2nd Digit)  34C  Wrist       Wrist Palm     Palm  *4.8   11.8        Radial Anti Sensory (Base 1st Digit)  33.9C  Wrist  2.8   9.2  Wrist Base 1st Digit     Ulnar Anti Sensory (5th Digit)  34.1C  Wrist  3.6   *5.8  Wrist 5th Digit  39   B Elbow  6.3   48.6  B Elbow Wrist      Motor Left/Right Comparison   Stim Site L Lat (ms) R Lat (ms) L-R Lat (ms) L Amp (mV) R Amp (mV) L-R Amp (%) Site1 Site2 L Vel (m/s) R Vel (m/s) L-R Vel (m/s)  Median Motor (Abd Poll Brev)  33.9C  Wrist  *11.7   *0.1  Elbow Wrist  *30   Elbow  18.4   0.1        Ulnar Motor (Abd Dig Min)  33.8C  Wrist  2.8   5.0  B Elbow Wrist  56   B Elbow  6.1   5.0  A Elbow B Elbow  69   A Elbow  7.4   4.9           Waveforms:

## 2017-11-08 DIAGNOSIS — F4323 Adjustment disorder with mixed anxiety and depressed mood: Secondary | ICD-10-CM | POA: Diagnosis not present

## 2017-11-08 DIAGNOSIS — F329 Major depressive disorder, single episode, unspecified: Secondary | ICD-10-CM | POA: Diagnosis not present

## 2017-11-11 ENCOUNTER — Encounter (HOSPITAL_COMMUNITY): Payer: Self-pay

## 2017-11-11 DIAGNOSIS — I272 Pulmonary hypertension, unspecified: Secondary | ICD-10-CM

## 2017-11-11 NOTE — Progress Notes (Signed)
Discharge Progress Report  Patient Details  Name: Kayla Mack MRN: [id number] Date of Birth: [birth year] Referring Provider:     Pulmonary Rehab Walk Test from 07/07/2017 in [place]  Referring Provider  Dr. Lake Bells       Number of Visits: 22  Reason for Discharge:  Patient reached a stable level of exercise. Patient independent in their exercise. Patient has met program and personal goals.  Smoking History:  Social History   Tobacco Use  Smoking Status Former Smoker  . Packs/day: 1.50  . Years: 40.00  . Pack years: 60.00  . Types: Cigarettes  . Last attempt to quit: 03/18/2017  . Years since quitting: 0.6  Smokeless Tobacco Never Used    Diagnosis:  Pulmonary hypertension ([place])  ADL UCSD: Pulmonary Assessment Scores    Row Name 10/27/17 0756 10/27/17 1631       ADL UCSD   ADL Phase  Exit  Exit    SOB Score total  -  51      CAT Score   CAT Score  -  18 Exit      mMRC Score   mMRC Score  1  -       Initial Exercise Prescription:   Discharge Exercise Prescription (Final Exercise Prescription Changes): Exercise Prescription Changes - 10/13/17 1200      Response to Exercise   Blood Pressure (Admit)  126/70    Blood Pressure (Exercise)  136/70    Blood Pressure (Exit)  116/60    Heart Rate (Admit)  72 bpm    Heart Rate (Exercise)  92 bpm    Heart Rate (Exit)  77 bpm    Oxygen Saturation (Admit)  96 %    Oxygen Saturation (Exercise)  94 %    Oxygen Saturation (Exit)  95 %    Rating of Perceived Exertion (Exercise)  13    Perceived Dyspnea (Exercise)  2    Duration  Progress to 45 minutes of aerobic exercise without signs/symptoms of physical distress    Intensity  THRR unchanged      Progression   Progression  Continue to progress workloads to maintain intensity without signs/symptoms of physical distress.      Resistance Training   Training Prescription  Yes    Weight  orange bands    Reps  10-15    Time   10 Minutes      Interval Training   Interval Training  No      Oxygen   Oxygen  Continuous    Liters  6-8      Recumbant Bike   Level  3    Minutes  17      NuStep   Level  5    Minutes  17    METs  2.5      Track   Laps  12    Minutes  17       Functional Capacity: 6 Minute Walk    Row Name 10/27/17 0746         6 Minute Walk   Phase  Discharge     Distance  1337 feet     Distance Feet Change  137 ft     Walk Time  6 minutes     # of Rest Breaks  0     MPH  2.53     METS  2.91     RPE  14     Perceived Dyspnea  4     Symptoms  No     Resting HR  72 bpm     Resting BP  145/78     Resting Oxygen Saturation   94 %     Exercise Oxygen Saturation  during 6 min walk  85 %     Max Ex. HR  116 bpm     Max Ex. BP  194/84     2 Minute Post BP  171/78       Interval HR   1 Minute HR  81     2 Minute HR  115     3 Minute HR  115     4 Minute HR  115     5 Minute HR  116     6 Minute HR  97     2 Minute Post HR  76     Interval Heart Rate?  Yes       Interval Oxygen   Interval Oxygen?  Yes     Baseline Oxygen Saturation %  94 %     1 Minute Oxygen Saturation %  88 %     1 Minute Liters of Oxygen  10 L     2 Minute Oxygen Saturation %  90 %     2 Minute Liters of Oxygen  10 L     3 Minute Oxygen Saturation %  90 %     3 Minute Liters of Oxygen  10 L     4 Minute Oxygen Saturation %  87 %     4 Minute Liters of Oxygen  10 L     5 Minute Oxygen Saturation %  87 %     5 Minute Liters of Oxygen  10 L     6 Minute Oxygen Saturation %  85 %     6 Minute Liters of Oxygen  10 L     2 Minute Post Oxygen Saturation %  94 %     2 Minute Post Liters of Oxygen  10 L        Psychological, QOL, Others - Outcomes: PHQ 2/9: Depression screen [facility] 2/9 10/25/2017 08/29/2017 07/27/2017 07/04/2017 07/01/2017  Decreased Interest _0 Down, Depressed, Hopeless _1 PHQ - 2 Score _2 Altered sleeping - 0 0 - 1  Tired, decreased energy - 1 1 - 1  Change  in appetite - 0 1 - 1  Feeling bad or failure about yourself  - 0 0 - 1  Trouble concentrating - 0 1 - 1  Moving slowly or fidgety/restless - 0 0 - 0  Suicidal thoughts - 0 0 - 0  PHQ-9 Score - 3 5 - 8  Difficult doing work/chores - Somewhat difficult - - Somewhat difficult    Quality of Life:   Personal Goals: Goals established at orientation with interventions provided to work toward goal.    Personal Goals Discharge: Goals and Risk Factor Review    Row Name 09/20/17 0739 10/13/17 0631 11/11/17 1526         Core Components/Risk Factors/Patient Goals Review   Personal Goals Review  Develop more efficient breathing techniques such as purse lipped breathing and diaphragmatic breathing and practicing self-pacing with activity.;Improve shortness of breath with ADL's;Increase knowledge of respiratory medications and ability to use respiratory devices properly.;Stress  Develop more efficient breathing techniques such as purse lipped breathing and diaphragmatic breathing and  practicing self-pacing with activity.;Improve shortness of breath with ADL's;Increase knowledge of respiratory medications and ability to use respiratory devices properly.;Stress  Develop more efficient breathing techniques such as purse lipped breathing and diaphragmatic breathing and practicing self-pacing with activity.;Improve shortness of breath with ADL's;Increase knowledge of respiratory medications and ability to use respiratory devices properly.;Stress     Review  patient is making significant progress in pulmoary rehab now that she does not have the stress of not having adequate oxygen at home. Her shortness of breath is improving with her ADLs, especially walking her dog. She is using PLB independently without coaching and states now that her oxygen issues are settles her stress level has minimized. She continues to manage the stress related to her 52f year old mother gracefully.  Patient continues to make progress  in pulmonary rehab. She will graduate after completing 3 more sessions. She feels she has been sucessful in completing the program. She states she is able to complete her adls with less shortness of breath and her stamina has increased significantly. This has alleviated some of her stress along with knowing that her mother is now accepting help in her home. Have talked with JLanekaabout continuing her exercise in the maintenance program post graduation.  patient met pulmonary rehab goals at discharge     Expected Outcomes  see admission outcomes  see admission outcomes  -        Exercise Goals and Review:   Nutrition & Weight - Outcomes:  Post Biometrics - 10/27/17 0756       Post  Biometrics   Grip Strength  28 kg       Nutrition:   Nutrition Discharge: Nutrition Assessments - 10/31/17 1026      Rate Your Plate Scores   Pre Score  -- returned form; not completed    Post Score  57       Education Questionnaire Score: Knowledge Questionnaire Score - 10/27/17 1631      Knowledge Questionnaire Score   Post Score  11/13        JEpifaniaplans to join a lL-[employer] Communicationsand continue her weekly exercise.

## 2017-11-15 ENCOUNTER — Encounter (INDEPENDENT_AMBULATORY_CARE_PROVIDER_SITE_OTHER): Payer: Self-pay | Admitting: Orthopaedic Surgery

## 2017-11-15 ENCOUNTER — Ambulatory Visit (INDEPENDENT_AMBULATORY_CARE_PROVIDER_SITE_OTHER): Payer: Medicare Other | Admitting: Orthopaedic Surgery

## 2017-11-15 DIAGNOSIS — G5601 Carpal tunnel syndrome, right upper limb: Secondary | ICD-10-CM

## 2017-11-15 DIAGNOSIS — M65341 Trigger finger, right ring finger: Secondary | ICD-10-CM

## 2017-11-15 DIAGNOSIS — F329 Major depressive disorder, single episode, unspecified: Secondary | ICD-10-CM | POA: Diagnosis not present

## 2017-11-15 DIAGNOSIS — F4323 Adjustment disorder with mixed anxiety and depressed mood: Secondary | ICD-10-CM | POA: Diagnosis not present

## 2017-11-15 NOTE — Progress Notes (Signed)
The patient comes in today for follow-up after having nerve conduction studies on the right upper extremity to assess the degree of carpal tunnel syndrome that she has.  She is somewhat is on chronic oxygen due to pulmonary hypertension.  She also has triggering of her ring finger on the right side and I provided injection that he said to calm down the pain but the triggering still present.  On exam she  weak pinch and grip strength on the right side.  She has numbness in the median nerve distribution and active triggering of the right ring finger A1 pulley.  Her nerve conduction studies do show severe carpal tunnel syndrome of the right upper extremity.  At this point due to the severity of her pain and numbness and tingling as well as the active triggering or recommending an open carpal tunnel release on the right side as well as an A1 pulley release of the right ring finger.  I still feel this can be done at the surgical center in light of her being on oxygen because this can be done under local anesthesia and she understands this as well.  We would then see her back at 2 weeks postoperative for suture removal.  All questions concerns were answered and addressed.

## 2017-11-24 ENCOUNTER — Encounter: Payer: Self-pay | Admitting: Orthopaedic Surgery

## 2017-11-24 DIAGNOSIS — M65341 Trigger finger, right ring finger: Secondary | ICD-10-CM | POA: Diagnosis not present

## 2017-11-24 DIAGNOSIS — G5601 Carpal tunnel syndrome, right upper limb: Secondary | ICD-10-CM | POA: Diagnosis not present

## 2017-11-28 ENCOUNTER — Other Ambulatory Visit: Payer: Self-pay

## 2017-11-28 NOTE — Patient Outreach (Signed)
[place] [facility]) Care Management  [place]  11/28/2017   Kayla Mack [birth year] [id number]  Subjective: Telephone call to the patient for monthly assessment. HIPAA verified. The patient states that she has been doing well.  The patient just recently had surgery on her right hand.  She has not been able to go exercise.  The patient has graduated from pulmonary rehab but was told that she could go up to the intermediate class.  She denies any pain or falls.  She is adherent with her medications. The patient states that her blood pressure has been running high and she has had an ache and pressure on the side of her chest for about 3 weeks.  She states that it comes and goes.  RN Health Coach advise the patient to call her physician office and inform them of her situation.  The patient verbalized understanding and stated that she would. The patient states that she has gained some weight.  She feels it is coming from her eating in place of her smoking.  She states that the weight has not affected her breathing.  Objective:   Encounter Medications:  Outpatient Encounter Medications as of 11/28/2017  Medication Sig Note  . acetaminophen (TYLENOL) 500 MG tablet Take 1,000 mg by mouth 2 (two) times daily as needed for pain.    . Albuterol Sulfate (VENTOLIN HFA IN) Inhale 2 puffs into the lungs 4 (four) times daily as needed.   [name] ELLIPTA 62.5-25 MCG/INH AEPB Inhale 1 puff into the lungs daily.   . bisoprolol (ZEBETA) 5 MG tablet Take 1 tablet (5 mg total) by mouth daily.   [name] buPROPion (WELLBUTRIN SR) 150 MG 12 hr tablet Take 150 mg by mouth daily.   . calcium-vitamin D (OSCAL WITH D) 500-200 MG-UNIT per tablet Take 1 tablet by mouth 2 (two) times daily.   . clonazePAM (KLONOPIN) 0.5 MG tablet Take 1 tablet (0.5 mg total) by mouth 2 (two) times daily as needed for anxiety. (Patient taking differently: Take 0.5 mg by mouth daily as needed for anxiety. )   . clopidogrel  (PLAVIX) 75 MG tablet Take 1 tablet (75 mg total) by mouth daily.   [name] lovastatin (MEVACOR) 40 MG tablet Take 40 mg by mouth every morning.   [name] PARoxetine (PAXIL) 20 MG tablet Take 20 mg by mouth daily.   . Vitamin D, Ergocalciferol, (DRISDOL) 50000 units CAPS capsule Take 50,000 Units by mouth every 7 (seven) days. 05/04/2017: Sundays  . feeding supplement, ENSURE ENLIVE, (ENSURE ENLIVE) LIQD Take 237 mLs by mouth 2 (two) times daily between meals. (Patient not taking: Reported on 08/29/2017)   . fluticasone (FLOVENT HFA) 110 MCG/ACT inhaler Inhale 2 puffs into the lungs 2 (two) times daily. (Patient not taking: Reported on 11/28/2017)   . nicotine (NICODERM CQ - DOSED IN MG/24 HOURS) 21 mg/24hr patch Place 21 mg onto the skin daily.   Enid Cutter HFA 108 (90 Base) MCG/ACT inhaler     No facility-administered encounter medications on file as of 11/28/2017.     Functional Status:  In your present state of health, do you have any difficulty performing the following activities: 05/19/2017 05/04/2017  Hearing? N N  Vision? N N  Difficulty concentrating or making decisions? N N  Walking or climbing stairs? N N  Dressing or bathing? N N  Doing errands, shopping? Y N  Preparing Food and eating ? N -  Using the Toilet? N -  In the past  six months, have you accidently leaked urine? N -  Do you have problems with loss of bowel control? N -  Managing your Medications? N -  Managing your Finances? N -  Housekeeping or managing your Housekeeping? Y -  Some recent data might be hidden    Fall/Depression Screening: Fall Risk  09/28/2017 08/29/2017 07/27/2017  Falls in the past year? No No No  Comment - - -  Risk for fall due to : - - -   PHQ 2/9 Scores 10/25/2017 08/29/2017 07/27/2017 07/04/2017 07/01/2017 05/19/2017 05/16/2017  PHQ - 2 Score _0 PHQ- 9 Score - 3 5 - _1 Assessment: Patient continues to benefit from health coach outreach for disease management and support.    [facility] CM  Care Plan Problem One     Most Recent Value  Care Plan for Problem One  Active  THN Long Term Goal   In 90 days the patient will verbalize that she has not been hospitalized  THN Long Term Goal Start Date  11/28/17  Interventions for Problem One [place] discused with the patient about her health conditions  THN CM Short Term Goal #1   In 30 days the patient will verbalize that she has called and spoken with her physician about her health concerns.  THN CM Short Term Goal #1 Start Date  11/28/17  Interventions for Short Term Goal #1  RN Health Coach discussed with the patient the benefits of discussion of her health conditios with her physician.     Plan: RN Health Coach will contact patient in the month of March  and patient agrees to next outreach. [facility] sent the patient EMMI information on Hypertension.  Lazaro Arms RN, BSN, [place] Direct Dial:  [phone] Fax: [phone]

## 2017-11-29 DIAGNOSIS — F4323 Adjustment disorder with mixed anxiety and depressed mood: Secondary | ICD-10-CM | POA: Diagnosis not present

## 2017-11-29 DIAGNOSIS — F329 Major depressive disorder, single episode, unspecified: Secondary | ICD-10-CM | POA: Diagnosis not present

## 2017-12-08 ENCOUNTER — Ambulatory Visit (INDEPENDENT_AMBULATORY_CARE_PROVIDER_SITE_OTHER): Payer: Medicare Other | Admitting: Orthopaedic Surgery

## 2017-12-08 ENCOUNTER — Encounter (INDEPENDENT_AMBULATORY_CARE_PROVIDER_SITE_OTHER): Payer: Self-pay | Admitting: Orthopaedic Surgery

## 2017-12-08 DIAGNOSIS — Z9889 Other specified postprocedural states: Secondary | ICD-10-CM | POA: Insufficient documentation

## 2017-12-08 NOTE — Progress Notes (Signed)
The patient is 2 weeks status post a right open carpal tunnel release as well as a right ring finger A1 pulley release.  She is doing well overall.  On examination her incisions look actually okay.  We removed all sutures.  I have placed Steri-Strips does want her to be able to put lotion and scar cream on this daily.  Her numbness is still present but minimal compared to what it was.  All questions concerns were answered and addressed.  She will increase her activities as comfort allows.  I would like to see her back for final visit in  4 weeks to make sure she is doing well overall.

## 2017-12-13 DIAGNOSIS — F329 Major depressive disorder, single episode, unspecified: Secondary | ICD-10-CM | POA: Diagnosis not present

## 2017-12-13 DIAGNOSIS — F4323 Adjustment disorder with mixed anxiety and depressed mood: Secondary | ICD-10-CM | POA: Diagnosis not present

## 2017-12-19 ENCOUNTER — Telehealth: Payer: Self-pay | Admitting: Emergency Medicine

## 2017-12-19 DIAGNOSIS — J449 Chronic obstructive pulmonary disease, unspecified: Secondary | ICD-10-CM

## 2017-12-19 NOTE — Telephone Encounter (Signed)
Called and spoke to pt. Pt is requesting to switch from [facility] to [facility] for oxygen, due to costumer service issues.  Order has been placed to [facility].  Pt has been scheduled for 47morov with RB on 01/25/17. Nothing further is needed.

## 2017-12-20 ENCOUNTER — Telehealth: Payer: Self-pay | Admitting: Emergency Medicine

## 2017-12-20 DIAGNOSIS — F329 Major depressive disorder, single episode, unspecified: Secondary | ICD-10-CM | POA: Diagnosis not present

## 2017-12-20 DIAGNOSIS — F4323 Adjustment disorder with mixed anxiety and depressed mood: Secondary | ICD-10-CM | POA: Diagnosis not present

## 2017-12-20 NOTE — Telephone Encounter (Signed)
Called and spoke to [place] with [facility].  Gilda states OV within 30 days of order is needed to proceed O2 order.  lmtcb x1 for pt.

## 2017-12-21 NOTE — Telephone Encounter (Signed)
Spoke with pt, aware of recs.  Scheduled to see SG on 3/14 for OV and qualifying walk.  Nothing further needed at this time.

## 2017-12-27 DIAGNOSIS — F329 Major depressive disorder, single episode, unspecified: Secondary | ICD-10-CM | POA: Diagnosis not present

## 2017-12-27 DIAGNOSIS — F4323 Adjustment disorder with mixed anxiety and depressed mood: Secondary | ICD-10-CM | POA: Diagnosis not present

## 2017-12-28 ENCOUNTER — Other Ambulatory Visit: Payer: Self-pay

## 2017-12-28 NOTE — Patient Outreach (Signed)
[place] [facility]) Care Management  12/28/2017  Shemeika Starzyk [place] 1945-10-26 [id number]   1st outreach attempt to the patient for monthly assessment. No answer.  HIPAA compliant voicemail left with contact information.  Plan:  RN Health Coach will make an outreach attempt to the patient in one business day.  Lazaro Arms RN, BSN, [place] Direct Dial:  [phone] Fax: [phone]

## 2017-12-29 ENCOUNTER — Encounter: Payer: Self-pay | Admitting: Acute Care

## 2017-12-29 ENCOUNTER — Other Ambulatory Visit: Payer: Self-pay

## 2017-12-29 ENCOUNTER — Ambulatory Visit (INDEPENDENT_AMBULATORY_CARE_PROVIDER_SITE_OTHER): Payer: Medicare Other | Admitting: Acute Care

## 2017-12-29 VITALS — BP 148/80 | HR 80 | Ht 62.0 in | Wt 129.6 lb

## 2017-12-29 DIAGNOSIS — J449 Chronic obstructive pulmonary disease, unspecified: Secondary | ICD-10-CM

## 2017-12-29 DIAGNOSIS — J9611 Chronic respiratory failure with hypoxia: Secondary | ICD-10-CM

## 2017-12-29 NOTE — Assessment & Plan Note (Signed)
Compliant with Anoro and Albuterol prn Plan: We will do a therapeutic trial of Trelegy. One puff once daily. Stop Anoro while taking Trelegy. If you like the Trelegy, let us know and we will send in a prescription. Rinse mouth after use. Continue albuterol as needed. Continue wearing oxygen at 4 L as you have been doing.\ Saturation goals are 88-92% on your oxygen. Follow up with your PCP regarding your BP. Continue with Pulmonary Rehab as you have been doing. Continue purse lip breathing as you have been doing. Follow up in 3 months with Dr.Byrum or Stacey Sago NP. Please contact office for sooner follow up if symptoms do not improve or worsen or seek emergency care

## 2017-12-29 NOTE — Assessment & Plan Note (Signed)
Compliant with oxygen at 5 L with exertion Plan: We will qualify you for oxygen with your new DME. Continue wearing oxygen at 4 L as you have been doing.\ Saturation goals are 88-92% on your oxygen. Follow up with your PCP regarding your BP. Continue with Pulmonary Rehab as you have been doing. Continue purse lip breathing as you have been doing. Follow up in 3 months with Dr.Byrum or Sarah NP. Please contact office for sooner follow up if symptoms do not improve or worsen or seek emergency care

## 2017-12-29 NOTE — Patient Outreach (Signed)
[facility]) Care Management  [place]  12/29/2017   Kayla Mack 1946-10-06 [id number]  Subjective: Telephone call to the patient for monthly assessment.  HIPAA verified.  The patient states that she is doing well.  She denies any pain or falls.  She states that she does not have any breathing problems and no swelling.  She is adherent with her medications She states that she has gained weight.  She gained it when she stopped smoking.  The patient stated that she went to an appointment today at her physician office and saw the PA. She had an assessment to switch her oxygen to [facility].  The patient currently is on 4 liters of o2.  She  Is also excited that she has been excepted back into the pulmonary intermediate class.  The patient states that she has been having some high reading for her blood pressure.  RN Health Coach advised the patient to record her readings and watch her sodium intake.  The patient verbalized understanding.   She will take her readings to her next appointment in three months.    Encounter Medications:  Outpatient Encounter Medications as of 12/29/2017  Medication Sig Note  . acetaminophen (TYLENOL) 500 MG tablet Take 1,000 mg by mouth 2 (two) times daily as needed for pain.    . Albuterol Sulfate (VENTOLIN HFA IN) Inhale 2 puffs into the lungs 4 (four) times daily as needed.   [name] ELLIPTA 62.5-25 MCG/INH AEPB Inhale 1 puff into the lungs daily.   . bisoprolol (ZEBETA) 5 MG tablet Take 1 tablet (5 mg total) by mouth daily.   [name] buPROPion (WELLBUTRIN SR) 150 MG 12 hr tablet Take 150 mg by mouth daily.   . calcium-vitamin D (OSCAL WITH D) 500-200 MG-UNIT per tablet Take 1 tablet by mouth 2 (two) times daily.   . clonazePAM (KLONOPIN) 0.5 MG tablet Take 1 tablet (0.5 mg total) by mouth 2 (two) times daily as needed for anxiety. (Patient taking differently: Take 0.5 mg by mouth daily as needed for anxiety. )   . clopidogrel (PLAVIX) 75 MG  tablet Take 1 tablet (75 mg total) by mouth daily.   [name] lovastatin (MEVACOR) 40 MG tablet Take 40 mg by mouth every morning.   [name] PARoxetine (PAXIL) 20 MG tablet Take 20 mg by mouth daily.   . Vitamin D, Ergocalciferol, (DRISDOL) 50000 units CAPS capsule Take 50,000 Units by mouth every 7 (seven) days. 05/04/2017: Sundays  . feeding supplement, ENSURE ENLIVE, (ENSURE ENLIVE) LIQD Take 237 mLs by mouth 2 (two) times daily between meals. (Patient not taking: Reported on 12/29/2017)   . VENTOLIN HFA 108 (90 Base) MCG/ACT inhaler     No facility-administered encounter medications on file as of 12/29/2017.     Functional Status:  In your present state of health, do you have any difficulty performing the following activities: 05/19/2017 05/04/2017  Hearing? N N  Vision? N N  Difficulty concentrating or making decisions? N N  Walking or climbing stairs? N N  Dressing or bathing? N N  Doing errands, shopping? Y N  Preparing Food and eating ? N -  Using the Toilet? N -  In the past six months, have you accidently leaked urine? N -  Do you have problems with loss of bowel control? N -  Managing your Medications? N -  Managing your Finances? N -  Housekeeping or managing your Housekeeping? Y -  Some recent data might be hidden  Fall/Depression Screening: Fall Risk  12/29/2017 12/29/2017 09/28/2017  Falls in the past year? No No No  Comment - - -  Risk for fall due to : - - -   PHQ 2/9 Scores 10/25/2017 08/29/2017 07/27/2017 07/04/2017 07/01/2017 05/19/2017 05/16/2017  PHQ - 2 Score _0 PHQ- 9 Score - 3 5 - _1 Assessment: Patient continues to benefit from health coach outreach for disease management and support.   THN CM Care Plan Problem One     Most Recent Value  THN Long Term Goal   In 90 days the patient will verbalize that she has not been hospitalized  THN Long Term Goal Start Date  12/29/17  Interventions for Problem One Long Term Goal  [facility] talked with the patient abut  her heath condition and stepd to take to be health.  THN CM Short Term Goal #1   In 30 days the patient will verbalize that she has called and spoken with her physician about her health concerns.  THN CM Short Term Goal #1 Start Date  12/29/17  [facility] CM Short Term Goal #1 Met Date  12/29/17  Interventions for Short Term Goal #1  The patient states that she hs discused with the physician assistant     Plan: [place] will contact patient in the month of April and patient agrees to next outreach.           RN Health Coach will send the patient educational material for HTN.  Lazaro Arms RN, BSN, [place] Direct Dial:  [phone]  Fax: [phone]

## 2017-12-29 NOTE — Progress Notes (Signed)
History of Present Illness Kayla Mack is a 72 y.o. female former smoker ( Quit 6.2018) with severe COPD. She is followed by Dr. Lamonte Sakai.She patricipates in cardiac rehab.   12/29/2017  Follow Up for qualifying walk. Pt. States she is doing well. She is at baseline. She is frustrated because she cannot have an Inogen tank due to her high oxygen demand. She is switching her DME to [facility] because they have more frequent oxygen deliveries in her zip code.[name] She will walk today to qualify for her oxygen for her new DME. She is compliant with her Anoro and her Albuterol for rescue.She states she rarely uses her rescue.She denies fever, chest pain, orthopnea or hemoptysis. Test Results:  CBC Latest Ref Rng & Units 05/13/2017 05/12/2017 05/11/2017  WBC 4.0 - 10.5 K/uL 11.4(H) 10.4 9.8  Hemoglobin 12.0 - 15.0 g/dL 12.8 13.8 13.4  Hematocrit 36.0 - 46.0 % 38.2 41.6 41.3  Platelets 150 - 400 K/uL 222 221 224    BMP Latest Ref Rng & Units 05/13/2017 05/12/2017 05/11/2017  Glucose 65 - 99 mg/dL 106(H) 85 91  BUN 6 - 20 mg/dL _0 Creatinine 0.44 - 1.00 mg/dL 0.75 0.82 0.66  Sodium 135 - 145 mmol/L 137 141 138  Potassium 3.5 - 5.1 mmol/L 3.7 5.1 3.9  Chloride 101 - 111 mmol/L 100(L) 101 99(L)  CO2 22 - 32 mmol/L 32 34(H) 33(H)  Calcium 8.9 - 10.3 mg/dL 8.0(L) 8.4(L) 8.3(L)    BNP No results found for: BNP  ProBNP No results found for: PROBNP  PFT    Component Value Date/Time   FEV1PRE 0.92 07/21/2017 1505   FEV1POST 0.89 07/21/2017 1505   FVCPRE 2.19 07/21/2017 1505   FVCPOST 2.11 07/21/2017 1505   TLC 5.32 07/21/2017 1505   DLCOUNC 5.00 07/21/2017 1505   PREFEV1FVCRT 42 07/21/2017 1505   PSTFEV1FVCRT 42 07/21/2017 1505    No results found.   Past medical hx Past Medical History:  Diagnosis Date  . Anxiety   . COPD (chronic obstructive pulmonary disease) ([place])   . Depression   . Hyperlipidemia   . Hypertension      Social History   Tobacco Use  . Smoking  status: Former Smoker    Packs/day: 1.50    Years: 40.00    Pack years: 60.00    Types: Cigarettes    Last attempt to quit: 03/18/2017    Years since quitting: 0.7  . Smokeless tobacco: Never Used  Substance Use Topics  . Alcohol use: Yes    Comment: liquor   . Drug use: No    Kayla Mack reports that she quit smoking about 9 months ago. Her smoking use included cigarettes. She has a 60.00 pack-year smoking history. she has never used smokeless tobacco. She reports that she drinks alcohol. She reports that she does not use drugs.  Tobacco Cessation: Former smoker quit 03/2017, 60 pack year smoking history  Past surgical hx, Family hx, Social hx all reviewed.  Current Outpatient Medications on File Prior to Visit  Medication Sig  . acetaminophen (TYLENOL) 500 MG tablet Take 1,000 mg by mouth 2 (two) times daily as needed for pain.   . Albuterol Sulfate (VENTOLIN HFA IN) Inhale 2 puffs into the lungs 4 (four) times daily as needed.  [name] ELLIPTA 62.5-25 MCG/INH AEPB Inhale 1 puff into the lungs daily.  . bisoprolol (ZEBETA) 5 MG tablet Take 1 tablet (5 mg total) by mouth daily.  [name] buPROPion [facility] SR)  150 MG 12 hr tablet Take 150 mg by mouth daily.  . calcium-vitamin D (OSCAL WITH D) 500-200 MG-UNIT per tablet Take 1 tablet by mouth 2 (two) times daily.  . clonazePAM (KLONOPIN) 0.5 MG tablet Take 1 tablet (0.5 mg total) by mouth 2 (two) times daily as needed for anxiety. (Patient taking differently: Take 0.5 mg by mouth daily as needed for anxiety. )  . clopidogrel (PLAVIX) 75 MG tablet Take 1 tablet (75 mg total) by mouth daily.  . feeding supplement, ENSURE ENLIVE, (ENSURE ENLIVE) LIQD Take 237 mLs by mouth 2 (two) times daily between meals. (Patient not taking: Reported on 12/29/2017)  . lovastatin (MEVACOR) 40 MG tablet Take 40 mg by mouth every morning.  [name] PARoxetine (PAXIL) 20 MG tablet Take 20 mg by mouth daily.  . VENTOLIN HFA 108 (90 Base) MCG/ACT inhaler   . Vitamin  D, Ergocalciferol, (DRISDOL) 50000 units CAPS capsule Take 50,000 Units by mouth every 7 (seven) days.   No current facility-administered medications on file prior to visit.      Allergies  Allergen Reactions  . Aleve [Naproxen Sodium] Hives  . Aspirin Swelling    Facial swelling  . Penicillins Hives and Swelling    Has patient had a PCN reaction causing immediate rash, facial/tongue/throat swelling, SOB or lightheadedness with hypotension: Yes Has patient had a PCN reaction causing severe rash involving mucus membranes or skin necrosis: Yes Has patient had a PCN reaction that required hospitalization: Unk Has patient had a PCN reaction occurring within the last 10 years: No If all of the above answers are "NO", then may proceed with Cephalosporin use.     Review Of Systems:  Constitutional:   No  weight loss, night sweats,  Fevers, chills, fatigue, or  lassitude.  HEENT:   No headaches,  Difficulty swallowing,  Tooth/dental problems, or  Sore throat,                No sneezing, itching, ear ache, nasal congestion, post nasal drip,   CV:  No chest pain,  Orthopnea, PND, swelling in lower extremities, anasarca, dizziness, palpitations, syncope.   GI  No heartburn, indigestion, abdominal pain, nausea, vomiting, diarrhea, change in bowel habits, loss of appetite, bloody stools.   Resp: + baseline  shortness of breath with exertion or at rest.  + baseline  excess mucus, + baseline  productive cough,  No non-productive cough,  No coughing up of blood.  No change in color of mucus.  No wheezing.  No chest wall deformity  Skin: no rash or lesions.  GU: no dysuria, change in color of urine, no urgency or frequency.  No flank pain, no hematuria   MS:  No joint pain or swelling.  No decreased range of motion.  No back pain.  Psych:  No change in mood or affect. No depression or anxiety.  No memory loss.   Vital Signs BP (!) 148/80 (BP Location: Left Arm, Cuff Size: Normal)   Pulse 80    Ht _0  (1.575 m)   Wt 129 lb 9.6 oz (58.8 kg)   SpO2 (!) 86%   BMI 23.70 kg/m    Physical Exam:  General- No distress,  A&Ox3, pleasant ENT: No sinus tenderness, TM clear, pale nasal mucosa, no oral exudate,no post nasal drip, no LAN Cardiac: S1, S2, regular rate and rhythm, no murmur Chest: No wheeze/ rales/ dullness; no accessory muscle use, no nasal flaring, no sternal retractions, prolonged expiratory phase Abd.: Soft Non-tender,  non-distended, BS + Ext: No clubbing cyanosis, edema Neuro:  normal strength Skin: No rashes, warm and dry Psych: normal mood and behavior   Assessment/Plan  Chronic respiratory failure with hypoxia (HCC) Compliant with oxygen at 5 L with exertion Plan: We will qualify you for oxygen with your new DME. Continue wearing oxygen at 4 L as you have been doing.\ Saturation goals are 88-92% on your oxygen. Follow up with your PCP regarding your BP. Continue with Pulmonary Rehab as you have been doing. Continue purse lip breathing as you have been doing. Follow up in 3 months with Dr.Byrum or Sarah NP. Please contact office for sooner follow up if symptoms do not improve or worsen or seek emergency care    COPD (chronic obstructive pulmonary disease) ([place]) Compliant with Anoro and Albuterol prn Plan: We will do a therapeutic trial of Trelegy. One puff once daily. Stop Anoro while taking Trelegy. If you like the Trelegy, let us know and we will send in a prescription. Rinse mouth after use. Continue albuterol as needed. Continue wearing oxygen at 4 L as you have been doing.\ Saturation goals are 88-92% on your oxygen. Follow up with your PCP regarding your BP. Continue with Pulmonary Rehab as you have been doing. Continue purse lip breathing as you have been doing. Follow up in 3 months with Dr.Byrum or Sarah NP. Please contact office for sooner follow up if symptoms do not improve or worsen or seek emergency care      Magdalen Spatz,  NP 12/29/2017  5:00 PM

## 2017-12-29 NOTE — Patient Instructions (Addendum)
It is nice to meet you today. We will qualify you for oxygen with your new DME. We will do a therapeutic trial of Trelegy. One puff once daily. Stop Anoro while taking Trelegy. If you like the Trelegy, let us know and we will send in a prescription. Rinse mouth after use. Continue albuterol as needed. Continue wearing oxygen at 4 L as you have been doing.\ Saturation goals are 88-92% on your oxygen. Follow up with your PCP regarding your BP. Continue with Pulmonary Rehab as you have been doing. Continue purse lip breathing as you have been doing. Follow up in 3 months with Dr.Byrum or Jevon Littlepage NP. Please contact office for sooner follow up if symptoms do not improve or worsen or seek emergency care

## 2018-01-03 DIAGNOSIS — F329 Major depressive disorder, single episode, unspecified: Secondary | ICD-10-CM | POA: Diagnosis not present

## 2018-01-03 DIAGNOSIS — F4323 Adjustment disorder with mixed anxiety and depressed mood: Secondary | ICD-10-CM | POA: Diagnosis not present

## 2018-01-04 ENCOUNTER — Telehealth: Payer: Self-pay | Admitting: Emergency Medicine

## 2018-01-04 DIAGNOSIS — J9611 Chronic respiratory failure with hypoxia: Secondary | ICD-10-CM

## 2018-01-04 NOTE — Telephone Encounter (Signed)
I'm not sure that there is a portable system that will deliver 10L/min flow, but it is OK with me to try to find a DME company that can provide one. If so then ask her if she wants to change.

## 2018-01-04 NOTE — Telephone Encounter (Signed)
Spoke with the pt  I advised no trelegy samples at this time  She will call next wk and check again  She states that when she was doing pulmonary rehab they had advised her to go up to 10 lpm with heavy exertion  Her current tanks from [facility] (just switched to them from [facility]) do not go up that high of a liter flow  Please advise thanks

## 2018-01-05 ENCOUNTER — Ambulatory Visit (INDEPENDENT_AMBULATORY_CARE_PROVIDER_SITE_OTHER): Payer: Medicare Other | Admitting: Physician Assistant

## 2018-01-05 NOTE — Telephone Encounter (Signed)
Spoke with the pt  She states that she would be willing to go with another DME if needed  Order sent to [facility]

## 2018-01-05 NOTE — Telephone Encounter (Signed)
There isn't a dme that provides a portable system that can deliver 10 lpm.  Will send message back to triage so they can contact pt and make her aware this is not available.

## 2018-01-05 NOTE — Telephone Encounter (Signed)
RB- there is not a POC that goes up to 10 lpm but rehab told her that this is what she needs to use with exertion. Do you have any other recs I can tell her? I don't want to discourage her from exercising

## 2018-01-06 NOTE — Telephone Encounter (Signed)
Pt is aware of below recommendations and voiced her understanding. Order for 10L tank has been placed.  Nothing further is needed.

## 2018-01-06 NOTE — Telephone Encounter (Signed)
I doubt there are portable tanks that will go to 10L/min either. It may be that she will be unable to obtain. May not need to exercise to the same degree as she does at rehab where the O2 can be at 10L. May have to change her whatever system we can find that delivers the highest continuous flow. Doub;t POC will be adequate.

## 2018-01-09 ENCOUNTER — Telehealth: Payer: Self-pay | Admitting: Emergency Medicine

## 2018-01-09 DIAGNOSIS — J449 Chronic obstructive pulmonary disease, unspecified: Secondary | ICD-10-CM

## 2018-01-09 NOTE — Telephone Encounter (Signed)
Left message for patient to call back.

## 2018-01-10 DIAGNOSIS — F329 Major depressive disorder, single episode, unspecified: Secondary | ICD-10-CM | POA: Diagnosis not present

## 2018-01-10 DIAGNOSIS — F4323 Adjustment disorder with mixed anxiety and depressed mood: Secondary | ICD-10-CM | POA: Diagnosis not present

## 2018-01-10 NOTE — Telephone Encounter (Signed)
Spoke with pt. She is needing new regulators for her oxygen. Pt needs a regulator that goes higher than 5L. Order has been placed. Nothing further was needed.

## 2018-01-17 DIAGNOSIS — F4323 Adjustment disorder with mixed anxiety and depressed mood: Secondary | ICD-10-CM | POA: Diagnosis not present

## 2018-01-17 DIAGNOSIS — F329 Major depressive disorder, single episode, unspecified: Secondary | ICD-10-CM | POA: Diagnosis not present

## 2018-01-25 ENCOUNTER — Encounter: Payer: Self-pay | Admitting: Emergency Medicine

## 2018-01-25 ENCOUNTER — Ambulatory Visit (INDEPENDENT_AMBULATORY_CARE_PROVIDER_SITE_OTHER): Payer: Medicare Other | Admitting: Emergency Medicine

## 2018-01-25 VITALS — BP 120/80 | HR 78 | Wt 130.0 lb

## 2018-01-25 DIAGNOSIS — J432 Centrilobular emphysema: Secondary | ICD-10-CM | POA: Diagnosis not present

## 2018-01-25 NOTE — Assessment & Plan Note (Signed)
We will do a trial of changing Anoro to Trelegy one inhalation once a day whenever we are able to obtain samples. Remember to rinse and gargle after starting this medication.  Keep albuterol available to use 2 puffs if needed for shortness of breath We will refer you for the pulmonary rehab maintenance program if you would like. Follow with Dr Lamonte Sakai in 4 months or sooner if you have any problems.

## 2018-01-25 NOTE — Assessment & Plan Note (Signed)
Continue your oxygen at 4 L/min at rest.  It is okay for you to increase your oxygen with exertion in order to meet our goal of saturations greater than 90%.  You can go as high as 10 L/min when you do heavy exertion or your pulmonary rehab.

## 2018-01-25 NOTE — Progress Notes (Signed)
Subjective:    Patient ID: Kayla Mack, female    DOB: 1946/04/08, 72 y.o.   MRN: [id number]  HPI 72 year old former smoker (11 pack years) with a history of hypertension, hyperlipidemia, depression. She has COPD, hospitalized in July 2018 for an acute exacerbation in the setting of community-acquired pneumonia. This was, complicated by hypercapnic respiratory failure for which she was treated with BiPAP. An echocardiogram showed evidence for her hypertension. This prompted a left and right heart catheterization on 05/09/17 that showed nonobstructive coronary artery disease and moderate PAH. She was discharged on Anoro plus Flovent, oxygen at 3-6 L/m. She has been doing pulmonary rehabilitation. She believes that she is regaining some of her functional capacity. She does not use albuterol currently. She is interested in a POC, has [facility]  Spirometry 06/06/17 > severe obstruction Pulmonary function testing performed today 10/4 were reviewed by me. These showSevere obstruction with an FEV1 of 0.92 L (48% predicted), no bronchodilator response, hyperinflated volumes, severely decreased diffusion capacity. R heart cath 05/09/17: PAP 57/22 (38), PAOP 12  ROV 01/25/18 --patient follows up today for COPD and chronic hypoxic and hypercapnic respiratory failure.  She has severe obstruction by pulmonary function testing.  At her last visit we had planned to do a trial of Trelegy to see if she preferred - we didn't have any samples. Currently on Anoro. Uses albuterol very rarely. Denies any cough or wheeze. No flares since last time.   She has completed pulm rehab, noted that she needed 10L/min when she was working the hardest with them. She switched her o2 to [place], she now has a constant flow 15L/min regulator that she can use on one of her portable tanks to allow exercise on 10L/min. At other times she is on 4L/min.    Review of Systems Progressive exertional SOB over several months Nocturnal  awakenings with SOB Minimal cough, no wheeze.    Past Medical History:  Diagnosis Date  . Anxiety   . COPD (chronic obstructive pulmonary disease) ([place])   . Depression   . Hyperlipidemia   . Hypertension      Family History  Problem Relation Age of Onset  . Heart disease Mother   . Heart failure Mother   . Heart disease Father        cabg  . Heart disease Sister   . Diabetes Sister      Social History   Socioeconomic History  . Marital status: Single    Spouse name: Not on file  . Number of children: Not on file  . Years of education: Not on file  . Highest education level: Not on file  Occupational History  . Not on file  Social Needs  . Financial resource strain: Not on file  . Food insecurity:    Worry: Not on file    Inability: Not on file  . Transportation needs:    Medical: Not on file    Non-medical: Not on file  Tobacco Use  . Smoking status: Former Smoker    Packs/day: 1.50    Years: 40.00    Pack years: 60.00    Types: Cigarettes    Last attempt to quit: 03/18/2017    Years since quitting: 0.8  . Smokeless tobacco: Never Used  Substance and Sexual Activity  . Alcohol use: Yes    Comment: liquor   . Drug use: No  . Sexual activity: Not Currently  Lifestyle  . Physical activity:    Days per week: Not  on file    Minutes per session: Not on file  . Stress: Not on file  Relationships  . Social connections:    Talks on phone: Not on file    Gets together: Not on file    Attends religious service: Not on file    Active member of club or organization: Not on file    Attends meetings of clubs or organizations: Not on file    Relationship status: Not on file  . Intimate partner violence:    Fear of current or ex partner: Not on file    Emotionally abused: Not on file    Physically abused: Not on file    Forced sexual activity: Not on file  Other Topics Concern  . Not on file  Social History Narrative  . Not on file  worked as a Midwife  Allergies  Allergen Reactions  . Aleve [Naproxen Sodium] Hives  . Aspirin Swelling    Facial swelling  . Penicillins Hives and Swelling    Has patient had a PCN reaction causing immediate rash, facial/tongue/throat swelling, SOB or lightheadedness with hypotension: Yes Has patient had a PCN reaction causing severe rash involving mucus membranes or skin necrosis: Yes Has patient had a PCN reaction that required hospitalization: Unk Has patient had a PCN reaction occurring within the last 10 years: No If all of the above answers are "NO", then may proceed with Cephalosporin use.      Outpatient Medications Prior to Visit  Medication Sig Dispense Refill  . acetaminophen (TYLENOL) 500 MG tablet Take 1,000 mg by mouth 2 (two) times daily as needed for pain.     . Albuterol Sulfate (VENTOLIN HFA IN) Inhale 2 puffs into the lungs 4 (four) times daily as needed.    [name] ELLIPTA 62.5-25 MCG/INH AEPB Inhale 1 puff into the lungs daily.    . bisoprolol (ZEBETA) 5 MG tablet Take 1 tablet (5 mg total) by mouth daily. 30 tablet 0  . buPROPion (WELLBUTRIN SR) 150 MG 12 hr tablet Take 150 mg by mouth daily.    . calcium-vitamin D (OSCAL WITH D) 500-200 MG-UNIT per tablet Take 1 tablet by mouth 2 (two) times daily.    . clonazePAM (KLONOPIN) 0.5 MG tablet Take 1 tablet (0.5 mg total) by mouth 2 (two) times daily as needed for anxiety. (Patient taking differently: Take 0.5 mg by mouth daily as needed for anxiety. ) 10 tablet 0  . clopidogrel (PLAVIX) 75 MG tablet Take 1 tablet (75 mg total) by mouth daily. 30 tablet 0  . feeding supplement, ENSURE ENLIVE, (ENSURE ENLIVE) LIQD Take 237 mLs by mouth 2 (two) times daily between meals. 237 mL 12  . lovastatin (MEVACOR) 40 MG tablet Take 40 mg by mouth every morning.    [name] PARoxetine (PAXIL) 20 MG tablet Take 20 mg by mouth daily.    . VENTOLIN HFA 108 (90 Base) MCG/ACT inhaler     . Vitamin D, Ergocalciferol, (DRISDOL) 50000 units CAPS capsule  Take 50,000 Units by mouth every 7 (seven) days.     No facility-administered medications prior to visit.          Objective:   Physical Exam Vitals:   01/25/18 1501 01/25/18 1502  BP:  120/80  Pulse:  78  SpO2:  96%  Weight: 130 lb (59 kg)    Gen: Pleasant, thin ill appearing, in no distress,  normal affect  ENT: No lesions,  mouth clear,  oropharynx clear, no  postnasal drip  Neck: No JVD, no stridor  Lungs: No use of accessory muscles, very distant, no wheezes   Cardiovascular: RRR, heart sounds normal, no murmur or gallops, no peripheral edema  Musculoskeletal: No deformities, no cyanosis or clubbing  Neuro: alert, non focal  Skin: Warm, no lesions or rashes       Assessment & Plan:  COPD (chronic obstructive pulmonary disease) (HCC) We will do a trial of changing Anoro to Trelegy one inhalation once a day whenever we are able to obtain samples. Remember to rinse and gargle after starting this medication.  Keep albuterol available to use 2 puffs if needed for shortness of breath We will refer you for the pulmonary rehab maintenance program if you would like. Follow with Dr Lamonte Sakai in 4 months or sooner if you have any problems.  Chronic respiratory failure with hypoxia (HCC) Continue your oxygen at 4 L/min at rest.  It is okay for you to increase your oxygen with exertion in order to meet our goal of saturations greater than 90%.  You can go as high as 10 L/min when you do heavy exertion or your pulmonary rehab.  Baltazar Apo, MD, PhD 01/25/2018, 3:26 PM [facility] [phone] or if no answer [phone]

## 2018-01-25 NOTE — Patient Instructions (Signed)
We will do a trial of changing Anoro to Trelegy one inhalation once a day whenever we are able to obtain samples. Remember to rinse and gargle after starting this medication.  Keep albuterol available to use 2 puffs if needed for shortness of breath Continue your oxygen at 4 L/min at rest.  It is okay for you to increase your oxygen with exertion in order to meet our goal of saturations greater than 90%.  You can go as high as 10 L/min when you do heavy exertion or your pulmonary rehab. We will refer you for the pulmonary rehab maintenance program if you would like. Follow with Dr Lamonte Sakai in 4 months or sooner if you have any problems.

## 2018-01-31 DIAGNOSIS — F4323 Adjustment disorder with mixed anxiety and depressed mood: Secondary | ICD-10-CM | POA: Diagnosis not present

## 2018-01-31 DIAGNOSIS — F329 Major depressive disorder, single episode, unspecified: Secondary | ICD-10-CM | POA: Diagnosis not present

## 2018-02-03 ENCOUNTER — Other Ambulatory Visit: Payer: Self-pay

## 2018-02-03 NOTE — Patient Outreach (Signed)
[facility]) Care Management  [place]  02/03/2018   Kayla Mack 05/15/1946 [id number]  Subjective: Telephone call to the patient for monthly assessment.  HIPAA verified.  The patient is doing well.  She denies any issues with her breathing, swelling, pain or falls.  She is adherent with her medications.  She is monitoring her diet for salt. The patient states that she has gained some weight but feels that it is due to her quitting smoking.  She is waiting for appointment date to go back to pulmonary rehab intermediate class.   The patient had an appointment with Dr Lamonte Sakai in April and she states that her next appointment will be in four months.   Encounter Medications:  Outpatient Encounter Medications as of 02/03/2018  Medication Sig Note  . acetaminophen (TYLENOL) 500 MG tablet Take 1,000 mg by mouth 2 (two) times daily as needed for pain.    . Albuterol Sulfate (VENTOLIN HFA IN) Inhale 2 puffs into the lungs 4 (four) times daily as needed.   [name] ELLIPTA 62.5-25 MCG/INH AEPB Inhale 1 puff into the lungs daily.   . bisoprolol (ZEBETA) 5 MG tablet Take 1 tablet (5 mg total) by mouth daily.   [name] buPROPion (WELLBUTRIN SR) 150 MG 12 hr tablet Take 150 mg by mouth daily.   . calcium-vitamin D (OSCAL WITH D) 500-200 MG-UNIT per tablet Take 1 tablet by mouth 2 (two) times daily.   . clonazePAM (KLONOPIN) 0.5 MG tablet Take 1 tablet (0.5 mg total) by mouth 2 (two) times daily as needed for anxiety. (Patient taking differently: Take 0.5 mg by mouth daily as needed for anxiety. )   . clopidogrel (PLAVIX) 75 MG tablet Take 1 tablet (75 mg total) by mouth daily.   [name] lovastatin (MEVACOR) 40 MG tablet Take 40 mg by mouth every morning.   [name] PARoxetine (PAXIL) 20 MG tablet Take 20 mg by mouth daily.   . Vitamin D, Ergocalciferol, (DRISDOL) 50000 units CAPS capsule Take 50,000 Units by mouth every 7 (seven) days. 05/04/2017: Sundays  . feeding supplement, ENSURE ENLIVE,  (ENSURE ENLIVE) LIQD Take 237 mLs by mouth 2 (two) times daily between meals. (Patient not taking: Reported on 02/03/2018)   . VENTOLIN HFA 108 (90 Base) MCG/ACT inhaler     No facility-administered encounter medications on file as of 02/03/2018.     Functional Status:  In your present state of health, do you have any difficulty performing the following activities: 05/19/2017 05/04/2017  Hearing? N N  Vision? N N  Difficulty concentrating or making decisions? N N  Walking or climbing stairs? N N  Dressing or bathing? N N  Doing errands, shopping? Y N  Preparing Food and eating ? N -  Using the Toilet? N -  In the past six months, have you accidently leaked urine? N -  Do you have problems with loss of bowel control? N -  Managing your Medications? N -  Managing your Finances? N -  Housekeeping or managing your Housekeeping? Y -  Some recent data might be hidden    Fall/Depression Screening: Fall Risk  02/03/2018 12/29/2017 12/29/2017  Falls in the past year? No No No  Comment - - -  Risk for fall due to : - - -   PHQ 2/9 Scores 10/25/2017 08/29/2017 07/27/2017 07/04/2017 07/01/2017 05/19/2017 05/16/2017  PHQ - 2 Score _0 PHQ- 9 Score - 3 5 - 8 10 10  Assessment: Patient continues to benefit from health coach outreach for disease management and support.     THN CM Care Plan Problem One     Most Recent Value  THN Long Term Goal   In 90 days the patient will verbalize that she has not been hospitalized  THN Long Term Goal Start Date  02/03/18  Interventions for Problem One Adrian discussed with the patient about her food intake and medicaiton adherence      Plan: [place] will contact patient in the month of May and patient agrees to next outreach.  Lazaro Arms RN, BSN, [place] Direct Dial:  [phone]  Fax: [phone]

## 2018-02-07 DIAGNOSIS — F4323 Adjustment disorder with mixed anxiety and depressed mood: Secondary | ICD-10-CM | POA: Diagnosis not present

## 2018-02-07 DIAGNOSIS — F329 Major depressive disorder, single episode, unspecified: Secondary | ICD-10-CM | POA: Diagnosis not present

## 2018-02-14 DIAGNOSIS — F4323 Adjustment disorder with mixed anxiety and depressed mood: Secondary | ICD-10-CM | POA: Diagnosis not present

## 2018-02-14 DIAGNOSIS — F329 Major depressive disorder, single episode, unspecified: Secondary | ICD-10-CM | POA: Diagnosis not present

## 2018-02-16 ENCOUNTER — Encounter (HOSPITAL_COMMUNITY)
Admission: RE | Admit: 2018-02-16 | Discharge: 2018-02-16 | Disposition: A | Discharge status: Home / Self Care | Payer: Self-pay | Source: Ambulatory Visit | Attending: Emergency Medicine | Admitting: Emergency Medicine

## 2018-02-16 ENCOUNTER — Telehealth: Payer: Self-pay | Admitting: Emergency Medicine

## 2018-02-16 DIAGNOSIS — J439 Emphysema, unspecified: Secondary | ICD-10-CM

## 2018-02-16 DIAGNOSIS — J432 Centrilobular emphysema: Secondary | ICD-10-CM | POA: Insufficient documentation

## 2018-02-16 NOTE — Telephone Encounter (Signed)
Molly at [facility] rehab requesting an order for an oximyzer for pt. RB please advise if ok to order.  Thanks.

## 2018-02-16 NOTE — Progress Notes (Signed)
Kayla Mack started the Pulmonary Rehab Maintenance program today. She tolerated exercise well without complaint.She is requiring 6-8 liters of oxygen to keep her saturations 88%. We will contact Dr. Lamonte Sakai to see if we can obtain her a order for a oximizer with her need for a high liter flow.

## 2018-02-16 NOTE — Telephone Encounter (Signed)
Order placed for pt to receive oximizer.  Nothing further needed at this time.

## 2018-02-16 NOTE — Telephone Encounter (Signed)
Yes this is OK 

## 2018-02-21 ENCOUNTER — Encounter (HOSPITAL_COMMUNITY)
Admission: RE | Admit: 2018-02-21 | Discharge: 2018-02-21 | Disposition: A | Discharge status: Home / Self Care | Payer: Self-pay | Source: Ambulatory Visit | Attending: Emergency Medicine | Admitting: Emergency Medicine

## 2018-02-23 ENCOUNTER — Encounter (HOSPITAL_COMMUNITY)
Admission: RE | Admit: 2018-02-23 | Discharge: 2018-02-23 | Disposition: A | Discharge status: Home / Self Care | Payer: Self-pay | Source: Ambulatory Visit | Attending: Emergency Medicine | Admitting: Emergency Medicine

## 2018-02-28 ENCOUNTER — Encounter (HOSPITAL_COMMUNITY): Payer: Self-pay

## 2018-02-28 DIAGNOSIS — F329 Major depressive disorder, single episode, unspecified: Secondary | ICD-10-CM | POA: Diagnosis not present

## 2018-02-28 DIAGNOSIS — F4323 Adjustment disorder with mixed anxiety and depressed mood: Secondary | ICD-10-CM | POA: Diagnosis not present

## 2018-03-02 ENCOUNTER — Encounter (HOSPITAL_COMMUNITY)
Admission: RE | Admit: 2018-03-02 | Discharge: 2018-03-02 | Disposition: A | Discharge status: Home / Self Care | Payer: Self-pay | Source: Ambulatory Visit | Attending: Emergency Medicine | Admitting: Emergency Medicine

## 2018-03-07 ENCOUNTER — Encounter (HOSPITAL_COMMUNITY)
Admission: RE | Admit: 2018-03-07 | Discharge: 2018-03-07 | Disposition: A | Discharge status: Home / Self Care | Payer: Self-pay | Source: Ambulatory Visit | Attending: Emergency Medicine | Admitting: Emergency Medicine

## 2018-03-07 DIAGNOSIS — F329 Major depressive disorder, single episode, unspecified: Secondary | ICD-10-CM | POA: Diagnosis not present

## 2018-03-07 DIAGNOSIS — F4323 Adjustment disorder with mixed anxiety and depressed mood: Secondary | ICD-10-CM | POA: Diagnosis not present

## 2018-03-09 ENCOUNTER — Encounter (HOSPITAL_COMMUNITY): Payer: Self-pay

## 2018-03-14 ENCOUNTER — Other Ambulatory Visit: Payer: Self-pay

## 2018-03-14 ENCOUNTER — Encounter (HOSPITAL_COMMUNITY)
Admission: RE | Admit: 2018-03-14 | Discharge: 2018-03-14 | Disposition: A | Discharge status: Home / Self Care | Payer: Self-pay | Source: Ambulatory Visit | Attending: Emergency Medicine | Admitting: Emergency Medicine

## 2018-03-14 DIAGNOSIS — F329 Major depressive disorder, single episode, unspecified: Secondary | ICD-10-CM | POA: Diagnosis not present

## 2018-03-14 DIAGNOSIS — F4323 Adjustment disorder with mixed anxiety and depressed mood: Secondary | ICD-10-CM | POA: Diagnosis not present

## 2018-03-14 NOTE — Patient Outreach (Signed)
[facility]) Care Management  03/14/2018  Kayla Mack 1946/02/19 [id number]   Telephone call to the patient for monthly assessment. Phone pick up and the a dial tone. Unable to leave a message for the patient.  Plan: RN Health Coach will send letter. RN Health Coach will make another attempt to the patient within four business days.  Lazaro Arms RN, BSN, [place] Direct Dial:  [phone]  Fax: [phone]

## 2018-03-15 ENCOUNTER — Other Ambulatory Visit: Payer: Self-pay

## 2018-03-15 ENCOUNTER — Telehealth: Payer: Self-pay | Admitting: Emergency Medicine

## 2018-03-15 NOTE — Telephone Encounter (Signed)
Spoke with the pt  She states that at her last visit we were supposed to have given her Trelegy to try, but we did not have any  She has been taking her Anoro still, and this is becoming too expensive  She is asking if she can still try the trelegy  We do have a few samples of this  Please advise if still okay to try her on this, thanks

## 2018-03-15 NOTE — Telephone Encounter (Signed)
Attempted to call patient, no answer, and no voicemail to leave a message.

## 2018-03-15 NOTE — Telephone Encounter (Signed)
OK to sample Trelegy if we have any

## 2018-03-15 NOTE — Patient Outreach (Signed)
[facility]) Care Management  [place]  03/15/2018   Kayla Mack February 06, 1946 [id number]  Subjective: Successful outreach to the patient for monthly assessment. HIPAA verified.  The patient states that she is doing fair. She denies any pain but had a fall this month on a wet floor with no injury. I discussed fall precautions with the patient and she verbalized understanding.  She states that she has been feeling more out of breath.  She is unsure if it is related to the heat. Talked with the patient about how heat affects breathing conditions.  She verbalized understanding.  She states that she helps her mother with her groceries.  At the store she has assistance with getting them to her care but at her mothers she has to take them up stairs and but them away and it drains her. She is also attending pulmonary rehab classes and during the exercises has to use her rescue inhaler.  Two months ago at her last appointment with her physician she discussed with him that she felt her Anora inhaler was not helping her.  She was told about another inhaler but can not remember the name.  She has not received any samples of the new medication and has stopped using her Anora.  When the patient tried to get the Anora refilled she states that it  will cost her 300 dollars. I advised the patient to call her physician office and notify them of her situation. She stated that she will notify them today.   Encounter Medications:  Outpatient Encounter Medications as of 03/15/2018  Medication Sig Note  . acetaminophen (TYLENOL) 500 MG tablet Take 1,000 mg by mouth 2 (two) times daily as needed for pain.    . bisoprolol (ZEBETA) 5 MG tablet Take 1 tablet (5 mg total) by mouth daily.   . calcium-vitamin D (OSCAL WITH D) 500-200 MG-UNIT per tablet Take 1 tablet by mouth 2 (two) times daily.   . clonazePAM (KLONOPIN) 0.5 MG tablet Take 1 tablet (0.5 mg total) by mouth 2 (two) times daily as  needed for anxiety. (Patient taking differently: Take 0.5 mg by mouth daily as needed for anxiety. )   . clopidogrel (PLAVIX) 75 MG tablet Take 1 tablet (75 mg total) by mouth daily.   [name] lovastatin (MEVACOR) 40 MG tablet Take 40 mg by mouth every morning.   [name] PARoxetine (PAXIL) 20 MG tablet Take 20 mg by mouth daily.   . VENTOLIN HFA 108 (90 Base) MCG/ACT inhaler    . Vitamin D, Ergocalciferol, (DRISDOL) 50000 units CAPS capsule Take 50,000 Units by mouth every 7 (seven) days. 05/04/2017: Sundays  . Albuterol Sulfate (VENTOLIN HFA IN) Inhale 2 puffs into the lungs 4 (four) times daily as needed.   [name] ELLIPTA 62.5-25 MCG/INH AEPB Inhale 1 puff into the lungs daily.   [name] buPROPion (WELLBUTRIN SR) 150 MG 12 hr tablet Take 150 mg by mouth daily.   . feeding supplement, ENSURE ENLIVE, (ENSURE ENLIVE) LIQD Take 237 mLs by mouth 2 (two) times daily between meals. (Patient not taking: Reported on 02/03/2018)    No facility-administered encounter medications on file as of 03/15/2018.     Functional Status:  In your present state of health, do you have any difficulty performing the following activities: 05/19/2017 05/04/2017  Hearing? N N  Vision? N N  Difficulty concentrating or making decisions? N N  Walking or climbing stairs? N N  Dressing or bathing? N N  Doing  errands, shopping? Y N  Preparing Food and eating ? N -  Using the Toilet? N -  In the past six months, have you accidently leaked urine? N -  Do you have problems with loss of bowel control? N -  Managing your Medications? N -  Managing your Finances? N -  Housekeeping or managing your Housekeeping? Y -  Some recent data might be hidden    Fall/Depression Screening: Fall Risk  03/15/2018 02/03/2018 12/29/2017  Falls in the past year? Yes No No  Comment - - -  Injury with Fall? No - -  Risk for fall due to : Other (Comment) - -  Risk for fall due to: Comment slipped on wet floor - -   PHQ 2/9 Scores 10/25/2017 08/29/2017 07/27/2017  07/04/2017 07/01/2017 05/19/2017 05/16/2017  PHQ - 2 Score _0 PHQ- 9 Score - 3 5 - _1 Assessment: Patient will continue to benefit from health coach outreach for disease management and support.  THN CM Care Plan Problem One     Most Recent Value  THN Long Term Goal   In 90 days the patient will verbalize that she has not been hospitalized  THN Long Term Goal Start Date  03/15/18  Interventions for Problem One Long Term Goal  T[place] with the patient abut medication adherence, exercise and diet  THN CM Short Term Goal #1   In 30 days the patient will verbalize that she has called and spoken with her physician about her health concerns.  THN CM Short Term Goal #1 Start Date  03/15/18  Interventions for Short Term Goal #1  Discussed with the patient about her anora inhaler and to discuss with her physician     Plan: [place] will contact patient in the month of June and patient agrees to next outreach. RN Health coach will also send a copy of the note to the physician office.   Lazaro Arms RN, BSN, [place] Direct Dial:  [phone]  Fax: [phone]

## 2018-03-16 ENCOUNTER — Encounter (HOSPITAL_COMMUNITY)
Admission: RE | Admit: 2018-03-16 | Discharge: 2018-03-16 | Disposition: A | Discharge status: Home / Self Care | Payer: Self-pay | Source: Ambulatory Visit | Attending: Emergency Medicine | Admitting: Emergency Medicine

## 2018-03-16 MED ORDER — FLUTICASONE-UMECLIDIN-VILANT 100-62.5-25 MCG/INH IN AEPB: 1.0000 | INHALATION_SPRAY | Freq: Every day | RESPIRATORY_TRACT | 0 refills | Status: DC

## 2018-03-16 NOTE — Telephone Encounter (Signed)
Called and spoke to patient. Let patient know sample would be up front for her to pick. Patient thanked staff for letting her know and that she would be picking up today.

## 2018-03-21 ENCOUNTER — Encounter (HOSPITAL_COMMUNITY)
Admission: RE | Admit: 2018-03-21 | Discharge: 2018-03-21 | Disposition: A | Discharge status: Home / Self Care | Payer: Self-pay | Source: Ambulatory Visit | Attending: Emergency Medicine | Admitting: Emergency Medicine

## 2018-03-21 DIAGNOSIS — J432 Centrilobular emphysema: Secondary | ICD-10-CM | POA: Insufficient documentation

## 2018-03-23 ENCOUNTER — Encounter (HOSPITAL_COMMUNITY)
Admission: RE | Admit: 2018-03-23 | Discharge: 2018-03-23 | Disposition: A | Discharge status: Home / Self Care | Payer: Self-pay | Source: Ambulatory Visit | Attending: Emergency Medicine | Admitting: Emergency Medicine

## 2018-03-28 ENCOUNTER — Encounter (HOSPITAL_COMMUNITY)
Admission: RE | Admit: 2018-03-28 | Discharge: 2018-03-28 | Disposition: A | Discharge status: Home / Self Care | Payer: Self-pay | Source: Ambulatory Visit | Attending: Emergency Medicine | Admitting: Emergency Medicine

## 2018-03-28 DIAGNOSIS — F329 Major depressive disorder, single episode, unspecified: Secondary | ICD-10-CM | POA: Diagnosis not present

## 2018-03-28 DIAGNOSIS — F4323 Adjustment disorder with mixed anxiety and depressed mood: Secondary | ICD-10-CM | POA: Diagnosis not present

## 2018-03-30 ENCOUNTER — Encounter (HOSPITAL_COMMUNITY)
Admission: RE | Admit: 2018-03-30 | Discharge: 2018-03-30 | Disposition: A | Discharge status: Home / Self Care | Payer: Self-pay | Source: Ambulatory Visit | Attending: Emergency Medicine | Admitting: Emergency Medicine

## 2018-04-02 ENCOUNTER — Inpatient Hospital Stay (HOSPITAL_COMMUNITY)
Admission: EM | Admit: 2018-04-02 | Discharge: 2018-04-04 | DRG: 190 | Disposition: A | Discharge status: Home / Self Care | Payer: Medicare Other | Attending: Internal Medicine | Admitting: Internal Medicine

## 2018-04-02 ENCOUNTER — Emergency Department (HOSPITAL_COMMUNITY): Payer: Medicare Other

## 2018-04-02 ENCOUNTER — Other Ambulatory Visit: Payer: Self-pay

## 2018-04-02 ENCOUNTER — Encounter (HOSPITAL_COMMUNITY): Payer: Self-pay

## 2018-04-02 DIAGNOSIS — E872 Acidosis: Secondary | ICD-10-CM | POA: Diagnosis present

## 2018-04-02 DIAGNOSIS — R0603 Acute respiratory distress: Secondary | ICD-10-CM | POA: Diagnosis not present

## 2018-04-02 DIAGNOSIS — R0902 Hypoxemia: Secondary | ICD-10-CM

## 2018-04-02 DIAGNOSIS — Z8249 Family history of ischemic heart disease and other diseases of the circulatory system: Secondary | ICD-10-CM

## 2018-04-02 DIAGNOSIS — I272 Pulmonary hypertension, unspecified: Secondary | ICD-10-CM | POA: Diagnosis present

## 2018-04-02 DIAGNOSIS — F329 Major depressive disorder, single episode, unspecified: Secondary | ICD-10-CM | POA: Diagnosis present

## 2018-04-02 DIAGNOSIS — J9602 Acute respiratory failure with hypercapnia: Secondary | ICD-10-CM | POA: Diagnosis not present

## 2018-04-02 DIAGNOSIS — I10 Essential (primary) hypertension: Secondary | ICD-10-CM | POA: Diagnosis present

## 2018-04-02 DIAGNOSIS — Z886 Allergy status to analgesic agent status: Secondary | ICD-10-CM

## 2018-04-02 DIAGNOSIS — E785 Hyperlipidemia, unspecified: Secondary | ICD-10-CM

## 2018-04-02 DIAGNOSIS — J9621 Acute and chronic respiratory failure with hypoxia: Secondary | ICD-10-CM | POA: Diagnosis present

## 2018-04-02 DIAGNOSIS — J449 Chronic obstructive pulmonary disease, unspecified: Secondary | ICD-10-CM | POA: Diagnosis not present

## 2018-04-02 DIAGNOSIS — Z880 Allergy status to penicillin: Secondary | ICD-10-CM

## 2018-04-02 DIAGNOSIS — J440 Chronic obstructive pulmonary disease with acute lower respiratory infection: Secondary | ICD-10-CM | POA: Diagnosis present

## 2018-04-02 DIAGNOSIS — I2510 Atherosclerotic heart disease of native coronary artery without angina pectoris: Secondary | ICD-10-CM | POA: Diagnosis present

## 2018-04-02 DIAGNOSIS — J9622 Acute and chronic respiratory failure with hypercapnia: Secondary | ICD-10-CM | POA: Diagnosis present

## 2018-04-02 DIAGNOSIS — J441 Chronic obstructive pulmonary disease with (acute) exacerbation: Secondary | ICD-10-CM | POA: Diagnosis not present

## 2018-04-02 DIAGNOSIS — Z7902 Long term (current) use of antithrombotics/antiplatelets: Secondary | ICD-10-CM

## 2018-04-02 DIAGNOSIS — Z79899 Other long term (current) drug therapy: Secondary | ICD-10-CM | POA: Diagnosis not present

## 2018-04-02 DIAGNOSIS — R062 Wheezing: Secondary | ICD-10-CM | POA: Diagnosis not present

## 2018-04-02 DIAGNOSIS — Z87891 Personal history of nicotine dependence: Secondary | ICD-10-CM

## 2018-04-02 DIAGNOSIS — J9611 Chronic respiratory failure with hypoxia: Secondary | ICD-10-CM | POA: Diagnosis not present

## 2018-04-02 DIAGNOSIS — R0689 Other abnormalities of breathing: Secondary | ICD-10-CM | POA: Diagnosis not present

## 2018-04-02 DIAGNOSIS — F32A Depression, unspecified: Secondary | ICD-10-CM

## 2018-04-02 DIAGNOSIS — D649 Anemia, unspecified: Secondary | ICD-10-CM | POA: Diagnosis present

## 2018-04-02 DIAGNOSIS — F419 Anxiety disorder, unspecified: Secondary | ICD-10-CM | POA: Diagnosis present

## 2018-04-02 DIAGNOSIS — Z888 Allergy status to other drugs, medicaments and biological substances status: Secondary | ICD-10-CM

## 2018-04-02 DIAGNOSIS — J9601 Acute respiratory failure with hypoxia: Secondary | ICD-10-CM

## 2018-04-02 DIAGNOSIS — Z9981 Dependence on supplemental oxygen: Secondary | ICD-10-CM | POA: Diagnosis not present

## 2018-04-02 LAB — CBC WITH DIFFERENTIAL/PLATELET
Abs Immature Granulocytes: 0.1 10*3/uL (ref 0.0–0.1)
Basophils Absolute: 0.2 10*3/uL — ABNORMAL HIGH (ref 0.0–0.1)
Basophils Relative: 2 %
Eosinophils Absolute: 1.3 10*3/uL — ABNORMAL HIGH (ref 0.0–0.7)
Eosinophils Relative: 10 %
HCT: 41.2 % (ref 36.0–46.0)
Hemoglobin: 12.4 g/dL (ref 12.0–15.0)
Immature Granulocytes: 0 %
Lymphocytes Relative: 32 %
Lymphs Abs: 4.1 10*3/uL — ABNORMAL HIGH (ref 0.7–4.0)
MCH: 29.5 pg (ref 26.0–34.0)
MCHC: 30.1 g/dL (ref 30.0–36.0)
MCV: 97.9 fL (ref 78.0–100.0)
Monocytes Absolute: 1.1 10*3/uL — ABNORMAL HIGH (ref 0.1–1.0)
Monocytes Relative: 9 %
Neutro Abs: 6.1 10*3/uL (ref 1.7–7.7)
Neutrophils Relative %: 47 %
Platelets: 446 10*3/uL — ABNORMAL HIGH (ref 150–400)
RBC: 4.21 MIL/uL (ref 3.87–5.11)
RDW: 12.3 % (ref 11.5–15.5)
WBC: 12.8 10*3/uL — ABNORMAL HIGH (ref 4.0–10.5)

## 2018-04-02 LAB — COMPREHENSIVE METABOLIC PANEL
ALT: 19 U/L (ref 14–54)
AST: 43 U/L — ABNORMAL HIGH (ref 15–41)
Albumin: 3.8 g/dL (ref 3.5–5.0)
Alkaline Phosphatase: 87 U/L (ref 38–126)
Anion gap: 11 (ref 5–15)
BUN: 14 mg/dL (ref 6–20)
CO2: 24 mmol/L (ref 22–32)
Calcium: 8.7 mg/dL — ABNORMAL LOW (ref 8.9–10.3)
Chloride: 102 mmol/L (ref 101–111)
Creatinine, Ser: 0.85 mg/dL (ref 0.44–1.00)
GFR calc Af Amer: >60 mL/min (ref >60)
GFR calc non Af Amer: >60 mL/min (ref >60)
Glucose, Bld: 156 mg/dL — ABNORMAL HIGH (ref 65–99)
Potassium: 5 mmol/L (ref 3.5–5.1)
Sodium: 137 mmol/L (ref 135–145)
Total Bilirubin: 0.5 mg/dL (ref 0.3–1.2)
Total Protein: 7.3 g/dL (ref 6.5–8.1)

## 2018-04-02 LAB — I-STAT ARTERIAL BLOOD GAS, ED
Acid-base deficit: 3 mmol/L — ABNORMAL HIGH (ref 0.0–2.0)
Bicarbonate: 27 mmol/L (ref 20.0–28.0)
O2 Saturation: 98 %
Patient temperature: 97.8
TCO2: 29 mmol/L (ref 22–32)
pCO2 arterial: 68.3 mmHg (ref 32.0–48.0)
pH, Arterial: 7.202 — ABNORMAL LOW (ref 7.350–7.450)
pO2, Arterial: 122 mmHg — ABNORMAL HIGH (ref 83.0–108.0)

## 2018-04-02 LAB — BRAIN NATRIURETIC PEPTIDE: B Natriuretic Peptide: 131.2 pg/mL — ABNORMAL HIGH (ref 0.0–100.0)

## 2018-04-02 LAB — TROPONIN I: Troponin I: <0.03 ng/mL (ref <0.03)

## 2018-04-02 LAB — MRSA PCR SCREENING: MRSA by PCR: NEGATIVE

## 2018-04-02 MED ORDER — FLUTICASONE-UMECLIDIN-VILANT 100-62.5-25 MCG/INH IN AEPB: 1.0000 | INHALATION_SPRAY | Freq: Every day | RESPIRATORY_TRACT | Status: DC

## 2018-04-02 MED ORDER — BISACODYL 10 MG RE SUPP: 10.0000 mg | Freq: Every day | RECTAL | Status: DC | PRN

## 2018-04-02 MED ORDER — HYDRALAZINE HCL 20 MG/ML IJ SOLN: 5.0000 mg | Freq: Three times a day (TID) | INTRAMUSCULAR | Status: DC | PRN

## 2018-04-02 MED ORDER — IPRATROPIUM BROMIDE 0.02 % IN SOLN
RESPIRATORY_TRACT | Status: AC
  Filled 2018-04-02: qty 5

## 2018-04-02 MED ORDER — ONDANSETRON HCL 4 MG/2ML IJ SOLN: 4.0000 mg | Freq: Four times a day (QID) | INTRAMUSCULAR | Status: DC | PRN

## 2018-04-02 MED ORDER — IPRATROPIUM-ALBUTEROL 0.5-2.5 (3) MG/3ML IN SOLN
3.0000 mL | Freq: Four times a day (QID) | RESPIRATORY_TRACT | Status: DC | PRN
  Administered 2018-04-02: 3 mL via RESPIRATORY_TRACT
  Filled 2018-04-02: qty 3

## 2018-04-02 MED ORDER — IPRATROPIUM BROMIDE 0.02 % IN SOLN
0.5000 mg | RESPIRATORY_TRACT | Status: DC
  Administered 2018-04-02: 0.5 mg via RESPIRATORY_TRACT
  Filled 2018-04-02: qty 2.5

## 2018-04-02 MED ORDER — ACETAMINOPHEN 650 MG RE SUPP: 650.0000 mg | Freq: Four times a day (QID) | RECTAL | Status: DC | PRN

## 2018-04-02 MED ORDER — SODIUM CHLORIDE 0.9 % IV SOLN
INTRAVENOUS | Status: DC
  Administered 2018-04-02 – 2018-04-03 (×2): via INTRAVENOUS

## 2018-04-02 MED ORDER — UMECLIDINIUM-VILANTEROL 62.5-25 MCG/INH IN AEPB
1.0000 | INHALATION_SPRAY | Freq: Every day | RESPIRATORY_TRACT | Status: DC
Start: 2018-04-02 — End: 2018-04-04
  Administered 2018-04-03 – 2018-04-04 (×2): 1 via RESPIRATORY_TRACT
  Filled 2018-04-02: qty 14

## 2018-04-02 MED ORDER — ENOXAPARIN SODIUM 40 MG/0.4ML ~~LOC~~ SOLN
40.0000 mg | SUBCUTANEOUS | Status: DC
  Administered 2018-04-02 – 2018-04-03 (×2): 40 mg via SUBCUTANEOUS
  Filled 2018-04-02 (×2): qty 0.4

## 2018-04-02 MED ORDER — LEVALBUTEROL HCL 1.25 MG/0.5ML IN NEBU
1.2500 mg | INHALATION_SOLUTION | Freq: Four times a day (QID) | RESPIRATORY_TRACT | Status: DC
  Filled 2018-04-02: qty 0.5

## 2018-04-02 MED ORDER — CALCIUM CARBONATE-VITAMIN D 500-200 MG-UNIT PO TABS
1.0000 | ORAL_TABLET | Freq: Two times a day (BID) | ORAL | Status: DC
  Administered 2018-04-02 – 2018-04-04 (×4): 1 via ORAL
  Filled 2018-04-02 (×4): qty 1

## 2018-04-02 MED ORDER — PRAVASTATIN SODIUM 10 MG PO TABS
10.0000 mg | ORAL_TABLET | Freq: Every day | ORAL | Status: DC
  Administered 2018-04-02 – 2018-04-03 (×2): 10 mg via ORAL
  Filled 2018-04-02 (×3): qty 1

## 2018-04-02 MED ORDER — AZITHROMYCIN 250 MG PO TABS: 500.0000 mg | ORAL_TABLET | Freq: Once | ORAL | Status: DC

## 2018-04-02 MED ORDER — METHYLPREDNISOLONE SODIUM SUCC 125 MG IJ SOLR
60.0000 mg | Freq: Two times a day (BID) | INTRAMUSCULAR | Status: AC
  Administered 2018-04-02 – 2018-04-03 (×2): 60 mg via INTRAVENOUS
  Filled 2018-04-02 (×2): qty 2

## 2018-04-02 MED ORDER — ALBUTEROL SULFATE (2.5 MG/3ML) 0.083% IN NEBU: 2.5000 mg | INHALATION_SOLUTION | RESPIRATORY_TRACT | Status: DC | PRN

## 2018-04-02 MED ORDER — VITAMIN D (ERGOCALCIFEROL) 1.25 MG (50000 UNIT) PO CAPS: 50000.0000 [IU] | ORAL_CAPSULE | ORAL | Status: DC

## 2018-04-02 MED ORDER — BISOPROLOL FUMARATE 5 MG PO TABS
5.0000 mg | ORAL_TABLET | Freq: Every day | ORAL | Status: DC
  Administered 2018-04-03 – 2018-04-04 (×2): 5 mg via ORAL
  Filled 2018-04-02 (×2): qty 1

## 2018-04-02 MED ORDER — HYDROCODONE-ACETAMINOPHEN 5-325 MG PO TABS: 1.0000 | ORAL_TABLET | ORAL | Status: DC | PRN

## 2018-04-02 MED ORDER — VITAMIN D (ERGOCALCIFEROL) 1.25 MG (50000 UNIT) PO CAPS
50000.0000 [IU] | ORAL_CAPSULE | ORAL | Status: DC
  Administered 2018-04-02: 50000 [IU] via ORAL
  Filled 2018-04-02: qty 1

## 2018-04-02 MED ORDER — SENNOSIDES-DOCUSATE SODIUM 8.6-50 MG PO TABS: 1.0000 | ORAL_TABLET | Freq: Every evening | ORAL | Status: DC | PRN

## 2018-04-02 MED ORDER — BUPROPION HCL ER (SR) 150 MG PO TB12: 150.0000 mg | ORAL_TABLET | Freq: Every day | ORAL | Status: DC

## 2018-04-02 MED ORDER — CLOPIDOGREL BISULFATE 75 MG PO TABS
75.0000 mg | ORAL_TABLET | Freq: Every day | ORAL | Status: DC
  Administered 2018-04-03 – 2018-04-04 (×2): 75 mg via ORAL
  Filled 2018-04-02 (×2): qty 1

## 2018-04-02 MED ORDER — IPRATROPIUM BROMIDE 0.02 % IN SOLN
1.0000 mg | Freq: Once | RESPIRATORY_TRACT | Status: AC
  Administered 2018-04-02: 1 mg via RESPIRATORY_TRACT
  Filled 2018-04-02: qty 5

## 2018-04-02 MED ORDER — CLONAZEPAM 0.5 MG PO TABS: 0.5000 mg | ORAL_TABLET | Freq: Two times a day (BID) | ORAL | Status: DC | PRN

## 2018-04-02 MED ORDER — ONDANSETRON HCL 4 MG PO TABS: 4.0000 mg | ORAL_TABLET | Freq: Four times a day (QID) | ORAL | Status: DC | PRN

## 2018-04-02 MED ORDER — PAROXETINE HCL 20 MG PO TABS
20.0000 mg | ORAL_TABLET | Freq: Every day | ORAL | Status: DC
  Administered 2018-04-03 – 2018-04-04 (×2): 20 mg via ORAL
  Filled 2018-04-02 (×2): qty 1

## 2018-04-02 MED ORDER — ALBUTEROL (5 MG/ML) CONTINUOUS INHALATION SOLN
10.0000 mg/h | INHALATION_SOLUTION | Freq: Once | RESPIRATORY_TRACT | Status: AC
  Administered 2018-04-02: 10 mg/h via RESPIRATORY_TRACT
  Filled 2018-04-02: qty 20

## 2018-04-02 MED ORDER — ACETAMINOPHEN 325 MG PO TABS
650.0000 mg | ORAL_TABLET | Freq: Four times a day (QID) | ORAL | Status: DC | PRN
  Administered 2018-04-02 (×2): 650 mg via ORAL
  Filled 2018-04-02 (×2): qty 2

## 2018-04-02 MED ORDER — ENSURE ENLIVE PO LIQD: 237.0000 mL | Freq: Two times a day (BID) | ORAL | Status: DC

## 2018-04-02 NOTE — ED Notes (Signed)
Pt placed on PureWick per [employer] banker, corporate)

## 2018-04-02 NOTE — ED Provider Notes (Signed)
Emergency Department Provider Note   I have reviewed the triage vital signs and the nursing notes.   HISTORY  Chief Complaint Respiratory Distress   HPI Kayla Mack is a 72 y.o. female history of COPD, anxiety, depression, hyperlipidemia and hypertension the presents to the emerge department today secondary to shortness of breath and cough.  Some of the patient's had a cough for couple days been productive of brown sputum but and has progressively worsening shortness of breath today.  No chest pain or fevers.  No nausea, vomiting, diarrhea or other associated symptoms.  No rashes.  EMS was called and patient had some difficulty diminished breath sounds that gave her DuoNeb and 125 of Solu-Medrol brought here for further evaluation on a breathing treatment.  Sounds like at 6 L she was only satting 86% however the breathing treatment she is in the mid 90s. No other associated or modifying symptoms.    Past Medical History:  Diagnosis Date  . Anxiety   . COPD (chronic obstructive pulmonary disease) ([place])   . Depression   . Hyperlipidemia   . Hypertension     Patient Active Problem List   Diagnosis Date Noted  . Chronic respiratory failure with hypoxia ([place]) 12/29/2017  . Status post carpal tunnel release 12/08/2017  . Carpal tunnel syndrome, right upper limb 10/17/2017  . Trigger finger, right ring finger 10/17/2017  . Pulmonary hypertension ([place])   . SOB (shortness of breath)   . Hyperkalemia   . Elevated troponin   . COPD (chronic obstructive pulmonary disease) ([place]) 05/04/2017  . Acute respiratory distress   . Tachycardia   . Hypokalemia   . Hypotension   . Tobacco use disorder 06/04/2013  . T12 burst fracture ([place]) 06/03/2013  . Left radius fracture 06/03/2013  . Right calcaneal fracture 06/03/2013  . Fall from ladder 06/03/2013  . Osteopenia of the elderly 06/03/2013    Past Surgical History:  Procedure Laterality Date  . APPENDECTOMY    . RIGHT/LEFT  HEART CATH AND CORONARY ANGIOGRAPHY N/A 05/09/2017   Procedure: Right/Left Heart Cath and Coronary Angiography;  Surgeon: [name], MD;  Location: [facility];  Service: Cardiovascular;  Laterality: N/A;  . SHOULDER SURGERY Right Pin  . TONSILLECTOMY      Current Outpatient Rx  . Order #: [id number] Class: Historical Med  . Order #: [id number] Class: Historical Med  . Order #: [id number] Class: Historical Med  . Order #: [id number] Class: Normal  . Order #: [id number] Class: Historical Med  . Order #: [id number] Class: No Print  . Order #: [id number] Class: Print  . Order #: [id number] Class: Normal  . Order #: [id number] Class: Normal  . Order #: [id number] Class: Sample  . Order #: [id number] Class: Historical Med  . Order #: [id number] Class: Historical Med  . Order #: [id number] Class: Historical Med  . Order #: [id number] Class: Historical Med    Allergies Aleve [naproxen sodium]; Aspirin; and Penicillins  Family History  Problem Relation Age of Onset  . Heart disease Mother   . Heart failure Mother   . Heart disease Father        cabg  . Heart disease Sister   . Diabetes Sister     Social History Social History   Tobacco Use  . Smoking status: Former Smoker    Packs/day: 1.50    Years: 40.00    Pack years: 60.00    Types: Cigarettes    Last attempt to quit: 03/18/2017    Years since quitting: 1.0  . Smokeless tobacco: Never  Used  Substance Use Topics  . Alcohol use: Yes    Comment: liquor   . Drug use: No    Review of Systems  All other systems negative except as documented in the HPI. All pertinent positives and negatives as reviewed in the HPI. ____________________________________________   PHYSICAL EXAM:  VITAL SIGNS: Vitals:   04/02/18 0357 04/02/18 0400 04/02/18 0410 04/02/18 0412  BP: (!) 156/89 (!) 161/100 (!) 161/100   Pulse: (!) 108 (!) 105 100   Resp: (!) 36  (!) 31   SpO2: 96% 98% 97% 97%     Constitutional: Alert and oriented. Ill appearing and in  respiratory distress. Eyes: Conjunctivae are normal. PERRL. EOMI. Head: Atraumatic. Nose: No congestion/rhinnorhea. Mouth/Throat: Mucous membranes are moist.  Oropharynx non-erythematous. Neck: No stridor.  No meningeal signs.   Cardiovascular: tachycardic rate, regular rhythm. Good peripheral circulation. Grossly normal heart sounds.   Respiratory: tachypneic respiratory effort, access use use and retractions. Lungs severely diminished with end expiratory wheezing. Gastrointestinal: Soft and nontender. No distention.  Musculoskeletal: No lower extremity tenderness nor edema. No gross deformities of extremities. Neurologic:  Normal speech and language. No gross focal neurologic deficits are appreciated.  Skin:  Skin is warm, dry and intact. No rash noted.  ____________________________________________   LABS (all labs ordered are listed, but only abnormal results are displayed)  Labs Reviewed  COMPREHENSIVE METABOLIC PANEL - Abnormal; Notable for the following components:      Result Value   Glucose, Bld 156 (*)    Calcium 8.7 (*)    AST 43 (*)    All other components within normal limits  CBC WITH DIFFERENTIAL/PLATELET - Abnormal; Notable for the following components:   WBC 12.8 (*)    Platelets 446 (*)    Lymphs Abs 4.1 (*)    Monocytes Absolute 1.1 (*)    Eosinophils Absolute 1.3 (*)    Basophils Absolute 0.2 (*)    All other components within normal limits  BRAIN NATRIURETIC PEPTIDE - Abnormal; Notable for the following components:   B Natriuretic Peptide 131.2 (*)    All other components within normal limits  I-STAT ARTERIAL BLOOD GAS, ED - Abnormal; Notable for the following components:   pH, Arterial 7.202 (*)    pCO2 arterial 68.3 (*)    pO2, Arterial 122.0 (*)    Acid-base deficit 3.0 (*)    All other components within normal limits  TROPONIN I  URINALYSIS, ROUTINE W REFLEX MICROSCOPIC  BLOOD GAS, ARTERIAL   ____________________________________________  EKG    EKG Interpretation  Date/Time:    Ventricular Rate:    PR Interval:    QRS Duration:   QT Interval:    QTC Calculation:   R Axis:     Text Interpretation:         ____________________________________________  RADIOLOGY  Dg Chest Port 1 View  Result Date: 04/02/2018 CLINICAL DATA:  Respiratory distress. On home oxygen. History of COPD. EXAM: PORTABLE CHEST 1 VIEW COMPARISON:  Chest radiograph May 09, 2017 FINDINGS: Cardiac silhouette is mildly enlarged and unchanged. Calcified aortic arch. Increased interstitial prominence with apical bullous changes and increased lung volumes. No pleural effusion or focal consolidation. No pneumothorax. Osteopenia. RIGHT scapular screw. IMPRESSION: COPD with increased interstitial prominence, possible atypical infection/bronchitis. Mild cardiomegaly. Aortic Atherosclerosis (ICD10-I70.0). Electronically Signed   By: Elon Alas M.D.   On: 04/02/2018 04:20    ____________________________________________   PROCEDURES  Procedure(s) performed:   Procedures  CRITICAL CARE Performed by: Merrily Pew Total  critical care time: 45 minutes Critical care time was exclusive of separately billable procedures and treating other patients. Critical care was necessary to treat or prevent imminent or life-threatening deterioration. Critical care was time spent personally by me on the following activities: development of treatment plan with patient and/or surrogate as well as nursing, discussions with consultants, evaluation of patient's response to treatment, examination of patient, obtaining history from patient or surrogate, ordering and performing treatments and interventions, ordering and review of laboratory studies, ordering and review of radiographic studies, pulse oximetry and re-evaluation of patient's condition.  ____________________________________________   INITIAL IMPRESSION / ASSESSMENT AND PLAN / ED COURSE  Patient was varies severely  diminished breath sounds nearly silent.  Some end expiratory wheezing.  Will start on BiPAP for respiratory distress and hypoxic respiratory failure.  No indication for intubation at this point.  Will reevaluate.  She Artie has prednisone on board we will give her continuous albuterol with Atrovent along with antibiotics.  Blood gas.  Review of her blood gas is consistent with likely acute hypercapnic respiratory failure also with hypoxia.  Patient significantly improved on the BiPAP but still has diminished breath sounds.  She is much more couple unable to sleep with it.  At this time will plan for admission.  No indication for intubation and patient is clinically doing much better likely will come off BiPAP soon.  Will discuss with medicine.  Azithromycin already given for copd/bronchitis.  Pertinent labs & imaging results that were available during my care of the patient were reviewed by me and considered in my medical decision making (see chart for details).  ____________________________________________  FINAL CLINICAL IMPRESSION(S) / ED DIAGNOSES  Final diagnoses:  COPD exacerbation ([place])  Respiratory distress  Hypoxia  Acute respiratory failure with hypoxia and hypercapnia (HCC)     MEDICATIONS GIVEN DURING THIS VISIT:  Medications  azithromycin (ZITHROMAX) tablet 500 mg (0 mg Oral Hold 04/02/18 0410)  albuterol (PROVENTIL,VENTOLIN) solution continuous neb (10 mg/hr Nebulization Given 04/02/18 0402)  ipratropium (ATROVENT) nebulizer solution 1 mg (1 mg Nebulization Given 04/02/18 0402)     NEW OUTPATIENT MEDICATIONS STARTED DURING THIS VISIT:  New Prescriptions   No medications on file    Note:  This note was prepared with assistance of Dragon voice recognition software. Occasional wrong-word or sound-a-like substitutions may have occurred due to the inherent limitations of voice recognition software.   Marnesha Gagen, Corene Cornea, MD 04/02/18 0530

## 2018-04-02 NOTE — H&P (Signed)
History and Physical    Kayla Mack CBS:[id number] DOB: 03-11-1946 DOA: 04/02/2018   PCP: Jani Gravel, MD   Patient coming from:  Home    Chief Complaint: Shortness of breath   HPI: Kayla Mack is a 72 y.o. 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 Karsten Ro supervision, headache, depression, hyperlipidemia, hypertension, presenting with worsening shortness of breath for the last couple of days, not improved with her home therapy.  She also reported brown sputum, but denies any hemoptysis.  She denies any chest pain, palpitations, fever or chills.  She denies any sick contacts.  The patient denies any nausea or vomiting.  She denies any diarrhea or other associated symptoms.  She denies any rashes.  Lower extremity swelling, or calf pain.  Patient denies any tobacco use (quit in 2018), recreational drug use or alcohol.  Denies any new over-the-counter products, or herbal products.  She called EMS, says she reported increased wheezing.  She denies any new environmental allergens in her house.  Is up-to-date with pneumonia and flu vaccine   ED Course:  BP 108/62   Pulse 73   Temp (S) 97.9 F (36.6 C) (Rectal)   Resp 18   SpO2 99%   Sodium 137, potassium 5, bicarb 24, glucose 156, BUN 14, creatinine 0.85, anion gap 11, alkaline phosphatase 87, albumin 3.8, AST 43, ALT 19, total bilirubin normal, BNP 131.2, troponin negative at less than 0.03 White count 12.8 (the patient is on steroids when of parenthesis, hemoglobin 12.4 that is 446. Lymphocytes is 4.1, with eosinophils 1.3,  ABG with pH 7.202, PCO2 68, PO2 122. Chest x-ray showed COPD, with increased interstitial prominence, but no infiltration. Patient was placed on BiPAP Last 2D echo in July 2018, shows EF 60 to 84%, normal systolic. Had a catheterization in July 2018, with moderate pulmonary hypertension, patent coronary  arteries, mild nonobstructive CAD, normal LVEDP.  Review of Systems:  As per HPI otherwise all other systems reviewed and are negative  Past Medical History:  Diagnosis Date  . Anxiety   . COPD (chronic obstructive pulmonary disease) ([place])   . Depression   . Hyperlipidemia   . Hypertension     Past Surgical History:  Procedure Laterality Date  . APPENDECTOMY    . RIGHT/LEFT HEART CATH AND CORONARY ANGIOGRAPHY N/A 05/09/2017   Procedure: Right/Left Heart Cath and Coronary Angiography;  Surgeon: [name], MD;  Location: [facility];  Service: Cardiovascular;  Laterality: N/A;  . SHOULDER SURGERY Right Pin  . TONSILLECTOMY      Social History Social History   Socioeconomic History  . Marital status: Single    Spouse name: Not on file  . Number of children: Not on file  . Years of education: Not on file  . Highest education level: Not on file  Occupational History  . Not on file  Social Needs  . Financial resource strain: Not on file  . Food insecurity:    Worry: Not on file    Inability: Not on file  . Transportation needs:    Medical: Not on file    Non-medical: Not on file  Tobacco Use  . Smoking status: Former Smoker    Packs/day: 1.50    Years: 40.00    Pack years: 60.00    Types: Cigarettes    Last attempt to quit: 03/18/2017    Years since quitting: 1.0  .  Smokeless tobacco: Never Used  Substance and Sexual Activity  . Alcohol use: Yes    Comment: liquor   . Drug use: No  . Sexual activity: Not Currently  Lifestyle  . Physical activity:    Days per week: Not on file    Minutes per session: Not on file  . Stress: Not on file  Relationships  . Social connections:    Talks on phone: Not on file    Gets together: Not on file    Attends religious service: Not on file    Active member of club or organization: Not on file    Attends meetings of clubs or organizations: Not on file    Relationship status: Not on file  . Intimate partner  violence:    Fear of current or ex partner: Not on file    Emotionally abused: Not on file    Physically abused: Not on file    Forced sexual activity: Not on file  Other Topics Concern  . Not on file  Social History Narrative  . Not on file     Allergies  Allergen Reactions  . Aleve [Naproxen Sodium] Hives  . Aspirin Swelling    Facial swelling  . Penicillins Hives and Swelling    Has patient had a PCN reaction causing immediate rash, facial/tongue/throat swelling, SOB or lightheadedness with hypotension: Yes Has patient had a PCN reaction causing severe rash involving mucus membranes or skin necrosis: Yes Has patient had a PCN reaction that required hospitalization: Unk Has patient had a PCN reaction occurring within the last 10 years: No If all of the above answers are "NO", then may proceed with Cephalosporin use.     Family History  Problem Relation Age of Onset  . Heart disease Mother   . Heart failure Mother   . Heart disease Father        cabg  . Heart disease Sister   . Diabetes Sister        Prior to Admission medications   Medication Sig Start Date End Date Taking? Authorizing Provider  acetaminophen (TYLENOL) 500 MG tablet Take 1,000 mg by mouth 2 (two) times daily as needed for pain.     [provider]  Albuterol Sulfate (VENTOLIN HFA IN) Inhale 2 puffs into the lungs 4 (four) times daily as needed.    [provider]  ANORO ELLIPTA 62.5-25 MCG/INH AEPB Inhale 1 puff into the lungs daily. 04/18/17   [provider]  bisoprolol (ZEBETA) 5 MG tablet Take 1 tablet (5 mg total) by mouth daily. 05/14/17   Caroline More, DO  buPROPion (WELLBUTRIN SR) 150 MG 12 hr tablet Take 150 mg by mouth daily.    [provider]  calcium-vitamin D (OSCAL WITH D) 500-200 MG-UNIT per tablet Take 1 tablet by mouth 2 (two) times daily. 06/06/13   Riebock, Estill Bakes, NP  clonazePAM (KLONOPIN) 0.5 MG tablet Take 1 tablet (0.5 mg total) by mouth 2 (two)  times daily as needed for anxiety. Patient taking differently: Take 0.5 mg by mouth daily as needed for anxiety.  06/06/13   Riebock, Estill Bakes, NP  clopidogrel (PLAVIX) 75 MG tablet Take 1 tablet (75 mg total) by mouth daily. 05/14/17   Caroline More, DO  feeding supplement, ENSURE ENLIVE, (ENSURE ENLIVE) LIQD Take 237 mLs by mouth 2 (two) times daily between meals. Patient not taking: Reported on 02/03/2018 05/13/17   Caroline More, DO  Fluticasone-Umeclidin-Vilant (TRELEGY ELLIPTA) 100-62.5-25 MCG/INH AEPB Inhale 1 puff into the  lungs daily. 03/16/18   Collene Gobble, MD  lovastatin (MEVACOR) 40 MG tablet Take 40 mg by mouth every morning.    [provider]  PARoxetine (PAXIL) 20 MG tablet Take 20 mg by mouth daily.    [provider]  VENTOLIN HFA 108 [phone] Base) MCG/ACT inhaler  05/23/17   [provider]  Vitamin D, Ergocalciferol, (DRISDOL) 50000 units CAPS capsule Take 50,000 Units by mouth every 7 (seven) days.    [provider]     Physical Exam:  Vitals:   04/02/18 0600 04/02/18 0630 04/02/18 0700 04/02/18 0739  BP: 102/71 111/65 114/63 108/62  Pulse: 91 86 78 73  Resp: 12 16 (!) 22 18  Temp:      TempSrc:      SpO2: 98% 97% 97% 99%   Constitutional: NAD, calm, appears more comfortable, she is still on BiPAP. Eyes: PERRL, lids and conjunctivae normal ENMT: Mucous membranes are moist, without exudate or lesions.  Patient is edentulous. Neck: normal, supple, no masses, no thyromegaly Respiratory: Bilateral expiratory wheezing, and rhonchi.  She has chronic atelectatic sounds.  Normal respiratory effort while on BiPAP. Cardiovascular: Regular rate and rhythm,  murmur, rubs or gallops. No extremity edema. 2+ pedal pulses. No carotid bruits.  Abdomen: Soft, non tender, No hepatosplenomegaly. Bowel sounds positive.  Musculoskeletal: no clubbing / cyanosis. Moves all extremities Skin: no jaundice, No lesions.  Neurologic: Sensation intact  Strength  equal in all extremities Psychiatric:   Alert and oriented x 3. Normal mood.     Labs on Admission: I have personally reviewed following labs and imaging studies  CBC: Recent Labs  Lab 04/02/18 0400  WBC 12.8*  NEUTROABS 6.1  HGB 12.4  HCT 41.2  MCV 97.9  PLT 446*    Basic Metabolic Panel: Recent Labs  Lab 04/02/18 0400  NA 137  K 5.0  CL 102  CO2 24  GLUCOSE 156*  BUN 14  CREATININE 0.85  CALCIUM 8.7*    GFR: CrCl cannot be calculated (Unknown ideal weight.).  Liver Function Tests: Recent Labs  Lab 04/02/18 0400  AST 43*  ALT 19  ALKPHOS 87  BILITOT 0.5  PROT 7.3  ALBUMIN 3.8   No results for input(s): LIPASE, AMYLASE in the last 168 hours. No results for input(s): AMMONIA in the last 168 hours.  Coagulation Profile: No results for input(s): INR, PROTIME in the last 168 hours.  Cardiac Enzymes: Recent Labs  Lab 04/02/18 0400  TROPONINI <0.03    BNP (last 3 results) No results for input(s): PROBNP in the last 8760 hours.  HbA1C: No results for input(s): HGBA1C in the last 72 hours.  CBG: No results for input(s): GLUCAP in the last 168 hours.  Lipid Profile: No results for input(s): CHOL, HDL, LDLCALC, TRIG, CHOLHDL, LDLDIRECT in the last 72 hours.  Thyroid Function Tests: No results for input(s): TSH, T4TOTAL, FREET4, T3FREE, THYROIDAB in the last 72 hours.  Anemia Panel: No results for input(s): VITAMINB12, FOLATE, FERRITIN, TIBC, IRON, RETICCTPCT in the last 72 hours.  Urine analysis:    Component Value Date/Time   COLORURINE YELLOW 06/02/2013 2253   APPEARANCEUR CLEAR 06/02/2013 2253   LABSPEC 1.010 06/02/2013 2253   PHURINE 6.0 06/02/2013 2253   GLUCOSEU NEGATIVE 06/02/2013 2253   HGBUR NEGATIVE 06/02/2013 2253   BILIRUBINUR NEGATIVE 06/02/2013 2253   KETONESUR NEGATIVE 06/02/2013 2253   PROTEINUR NEGATIVE 06/02/2013 2253   UROBILINOGEN 0.2 06/02/2013 2253   NITRITE NEGATIVE 06/02/2013 2253  LEUKOCYTESUR NEGATIVE  06/02/2013 2253    Sepsis Labs: _0 (procalcitonin:4,lacticidven:4) )No results found for this or any previous visit (from the past 240 hour(s)).   Radiological Exams on Admission: Dg Chest Port 1 View  Result Date: 04/02/2018 CLINICAL DATA:  Respiratory distress. On home oxygen. History of COPD. EXAM: PORTABLE CHEST 1 VIEW COMPARISON:  Chest radiograph May 09, 2017 FINDINGS: Cardiac silhouette is mildly enlarged and unchanged. Calcified aortic arch. Increased interstitial prominence with apical bullous changes and increased lung volumes. No pleural effusion or focal consolidation. No pneumothorax. Osteopenia. RIGHT scapular screw. IMPRESSION: COPD with increased interstitial prominence, possible atypical infection/bronchitis. Mild cardiomegaly. Aortic Atherosclerosis (ICD10-I70.0). Electronically Signed   By: Elon Alas M.D.   On: 04/02/2018 04:20    EKG: Independently reviewed.  Assessment/Plan Principal Problem:   COPD exacerbation (HCC) Active Problems:   Chronic respiratory failure with hypoxia (HCC)   COPD (chronic obstructive pulmonary disease) (HCC)   Pulmonary hypertension (HCC)   Depression   Anxiety   Hypertension   Hyperlipidemia     Acute hypoxic respiratory failure likely secondary to acute COPD exacerbation.  Patient is on 4 L at home.  Shortness of breath was worsened over the last couple of days, despite respiratory therapy, and compliance with her nebulizer treatments as an outpatientABG with pH 7.202, PCO2 68, PO2 122.Chest x-ray showed COPD, with increased interstitial prominence, but no infiltration. Patient was placed on BiPAP, received Solu-Medrol 125 mg x 1 on transport, azithromycin and received nebulizer treatment, with improvement of her symptoms.  However, she is still not been able to weaned off BiPAP, for which we will admit for management of her symptoms.  White count is 12.8 (on steroid inhaler at home), but is afebrile. O2 sats were in the  80s on 4 L, but now is in the 90s on BiPAP.    Admit to SDU Continue Azithromycin  Continue Nebs with Duoneb and Albuterol Resume her home Anoro Ellipta and Fluticasone/Umeclidin/Vilant  Continue O2 [place] (baseline she is at 4 L at home) when weaned off Bipap CBC in am Incentive spirometry Symptoms continue to worsen, will obtain pulmonary consultation, otherwise she can follow-up as an outpatient with Dr. Lamonte Sakai once discharged. Continue Solu-Medrol 60 mg IV  twice daily, will need to switch to oral eventually, and discharged on prednisone taper  Anxiety and Depression Continue Wellbutrin, Paxil, and Klonopin, will tolerate p.o.   Hypertension BP 108/62   Pulse 73    Continue home anti-hypertensive medications when able to tolerate po  Add Hydralazine Q8 hours as needed for BP 180  Hyperlipidemia Continue home Pravachol when able to take po  History of nonobstructive coronary artery disease, per cardiac catheterization 05/09/2017,-moderate PAH. Tn neg  Continue Anoro and Pulm Rehab as mentioned above  Follow pulmonary Dr Lamonte Sakai   DVT prophylaxis:  Lovenox Code Status:    Full Family Communication:  Discussed with patient Disposition Plan: Expect patient to be discharged to home after condition improves Consults called:    None Admission status: IP SDU   Sharene Butters, PA-C [facility]   Amion text  [phone]   04/02/2018, 8:26 AM

## 2018-04-02 NOTE — Care Management (Signed)
This is a no charge note  Pending admission per Dr. Dolly Rias  72 year old lady with past medical history of COPD on 3 L nasal cannula oxygen at home, hypertension, hyperlipidemia, depression, anxiety, who presents with cough and shortness of breath for few days.  Oxygen desaturated to 80%.  Chest x-ray showed COPD and increased from interstitial prominence, but no infiltration.  Clinically consistent with COPD exacerbation.  ABG with pH 7.202, PCO2 68, PO2 122.  Patient is put on BiPAP.  Admitted to stepdown as inpatient.    Ivor Costa, MD  [facility] Pager [phone]  If 7PM-7AM, please contact night-coverage [email or web address] Password [facility] 04/02/2018, 5:41 AM

## 2018-04-02 NOTE — ED Triage Notes (Signed)
Pt comes via [facility] for resp distress, upon EMS  arrival lung sounds absent, started duoneb, wheezing now, PTA 125 solumedrol. Pt tripoding, pt wears home O2, bumped herself up to 6L and was 80% initially. Hx of COPD and CHF

## 2018-04-03 DIAGNOSIS — E785 Hyperlipidemia, unspecified: Secondary | ICD-10-CM

## 2018-04-03 DIAGNOSIS — J9611 Chronic respiratory failure with hypoxia: Secondary | ICD-10-CM

## 2018-04-03 DIAGNOSIS — J441 Chronic obstructive pulmonary disease with (acute) exacerbation: Principal | ICD-10-CM

## 2018-04-03 DIAGNOSIS — I10 Essential (primary) hypertension: Secondary | ICD-10-CM

## 2018-04-03 DIAGNOSIS — I272 Pulmonary hypertension, unspecified: Secondary | ICD-10-CM

## 2018-04-03 DIAGNOSIS — F419 Anxiety disorder, unspecified: Secondary | ICD-10-CM

## 2018-04-03 DIAGNOSIS — J449 Chronic obstructive pulmonary disease, unspecified: Secondary | ICD-10-CM

## 2018-04-03 LAB — URINALYSIS, ROUTINE W REFLEX MICROSCOPIC
Bilirubin Urine: NEGATIVE
Glucose, UA: NEGATIVE mg/dL
Hgb urine dipstick: NEGATIVE
Ketones, ur: NEGATIVE mg/dL
Leukocytes, UA: NEGATIVE
Nitrite: NEGATIVE
Protein, ur: NEGATIVE mg/dL
Specific Gravity, Urine: 1.006 (ref 1.005–1.030)
pH: 6 (ref 5.0–8.0)

## 2018-04-03 LAB — RESPIRATORY PANEL BY PCR

## 2018-04-03 LAB — CBC
HCT: 34 % — ABNORMAL LOW (ref 36.0–46.0)
Hemoglobin: 10.7 g/dL — ABNORMAL LOW (ref 12.0–15.0)
MCH: 29.9 pg (ref 26.0–34.0)
MCHC: 31.5 g/dL (ref 30.0–36.0)
MCV: 95 fL (ref 78.0–100.0)
Platelets: 333 10*3/uL (ref 150–400)
RBC: 3.58 MIL/uL — ABNORMAL LOW (ref 3.87–5.11)
RDW: 12.4 % (ref 11.5–15.5)
WBC: 10.8 10*3/uL — ABNORMAL HIGH (ref 4.0–10.5)

## 2018-04-03 LAB — GLUCOSE, CAPILLARY: Glucose-Capillary: 162 mg/dL — ABNORMAL HIGH (ref 65–99)

## 2018-04-03 LAB — BASIC METABOLIC PANEL
Anion gap: 8 (ref 5–15)
BUN: 15 mg/dL (ref 6–20)
CO2: 26 mmol/L (ref 22–32)
Calcium: 9 mg/dL (ref 8.9–10.3)
Chloride: 105 mmol/L (ref 101–111)
Creatinine, Ser: 0.75 mg/dL (ref 0.44–1.00)
GFR calc Af Amer: >60 mL/min (ref >60)
GFR calc non Af Amer: >60 mL/min (ref >60)
Glucose, Bld: 133 mg/dL — ABNORMAL HIGH (ref 65–99)
Potassium: 5.6 mmol/L — ABNORMAL HIGH (ref 3.5–5.1)
Sodium: 139 mmol/L (ref 135–145)

## 2018-04-03 LAB — POTASSIUM: Potassium: 4.8 mmol/L (ref 3.5–5.1)

## 2018-04-03 NOTE — Evaluation (Signed)
Occupational Therapy Evaluation Patient Details Name: Kayla Mack MRN: [id number] DOB: 09-30-1946 Today's Date: 04/03/2018    History of Present Illness Kayla Mack is a 72 y.o. 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  She also reported brown sputum, but denies any hemoptysis. Acute hypoxic respiratory failure likely secondary to acute COPD exacerbation   Clinical Impression   This 72 y/o female presents with the above. At baseline pt is independent with ADLs, iADLs and functional mobility, assists with caring for her mother. Pt completing room level functional mobility without AD and supervision throughout this session; demonstrates standing grooming  and LB ADLs with supervision-minguard assist. Pt on 2.5L O2 during session with O2 sats decreasing to 83% with room level activity, rebounding to 91% given seated rest break and pursed lip breathing. Educated pt on energy conservation strategies and activity pacing during ADL/iADL tasks at home with handout provided and pt verbalizing understanding. Will continue to follow while pt remains in acute setting to maximize her activity tolerance, safety and independence with ADLs and mobility.     Follow Up Recommendations  No OT follow up;Supervision - Intermittent;Other (comment)(continue with outpt pulmonary rehab)    Equipment Recommendations  None recommended by OT           Precautions / Restrictions Precautions Precautions: Other (comment) Precaution Comments: Desats with activity Restrictions Weight Bearing Restrictions: No      Mobility Bed Mobility Overal bed mobility: Independent                Transfers Overall transfer level: Independent               General  transfer comment: No difficulty observed    Balance Overall balance assessment: No apparent balance deficits (not formally assessed)                                         ADL either performed or assessed with clinical judgement   ADL Overall ADL's : Needs assistance/impaired Eating/Feeding: Sitting;Independent   Grooming: Supervision/safety;Standing   Upper Body Bathing: Supervision/ safety;Sitting   Lower Body Bathing: Supervison/ safety;Sit to/from stand   Upper Body Dressing : Modified independent;Sitting   Lower Body Dressing: Supervision/safety;Sit to/from stand Lower Body Dressing Details (indicate cue type and reason): pt easily reaching to adjust socks seated EOB  Toilet Transfer: Ambulation;Regular Toilet;Supervision/safety Toilet Transfer Details (indicate cue type and reason): simulated in transfer to/from [place] and Hygiene: Supervision/safety;Sit to/from stand       Functional mobility during ADLs: Supervision/safety General ADL Comments: pt completing room level functional mobility without AD, pt is fast moving (assume this is her baseline); pt with decreased O2 with activity, lowest O2 level 83% on 2.5L, rebounds to 91% with seated rest break and deep breathing. Discussion held and education provided on activity pacing and energy conservation with [facility] handout issued, pt verbalizing understanding. Pt also reports she has a pulse ox at home.      Vision         Perception     Praxis      Pertinent Vitals/Pain Pain Assessment: No/denies pain     Hand Dominance Right   Extremity/Trunk Assessment Upper Extremity Assessment  Upper Extremity Assessment: Overall WFL for tasks assessed   Lower Extremity Assessment Lower Extremity Assessment: Defer to PT evaluation       Communication Communication Communication: No difficulties   Cognition Arousal/Alertness: Awake/alert Behavior During Therapy: WFL for  tasks assessed/performed Overall Cognitive Status: Within Functional Limits for tasks assessed                                     General Comments       Exercises     Shoulder Instructions      Home Living Family/patient expects to be discharged to:: Private residence Living Arrangements: Alone Available Help at Discharge: Friend(s);Available PRN/intermittently(takes care of her mom ) Type of Home: House Home Access: Ramped entrance     Home Layout: One level     Bathroom Shower/Tub: Teacher, early years/pre: Standard     Home Equipment: Other (comment)(reports she has access to her mom's shower chair )   Additional Comments: Caregiver for mother, 3 steps to enter Mom's home      Prior Functioning/Environment Level of Independence: Independent        Comments: Drives; goes to [facility]        OT Problem List: Cardiopulmonary status limiting activity;Decreased activity tolerance      OT Treatment/Interventions: Self-care/ADL training;Therapeutic activities;Energy conservation;Patient/family education;Therapeutic exercise;DME and/or AE instruction;Balance training    OT Goals(Current goals can be found in the care plan section) Acute Rehab OT Goals Patient Stated Goal: Be able to walk longer distance  OT Goal Formulation: With patient Time For Goal Achievement: 04/17/18 Potential to Achieve Goals: Good  OT Frequency: Min 2X/week   Barriers to D/C:            Co-evaluation              AM-PAC PT "6 Clicks" Daily Activity     Outcome Measure Help from another person eating meals?: None Help from another person taking care of personal grooming?: None Help from another person toileting, which includes using toliet, bedpan, or urinal?: None Help from another person bathing (including washing, rinsing, drying)?: A Little Help from another person to put on and taking off regular upper body clothing?: None Help from  another person to put on and taking off regular lower body clothing?: A Little 6 Click Score: 22   End of Session Equipment Utilized During Treatment: Oxygen Nurse Communication: Mobility status  Activity Tolerance: Patient tolerated treatment well Patient left: in bed;with call bell/phone within reach  OT Visit Diagnosis: Muscle weakness (generalized) (M62.81)                Time: 6761-9509 OT Time Calculation (min): 25 min Charges:  OT General Charges $OT Visit: 1 Visit OT Evaluation $OT Eval Moderate Complexity: 1 Mod G-Codes:     Lou Cal, OT Pager [phone] 04/03/2018   Raymondo Band 04/03/2018, 5:00 PM

## 2018-04-03 NOTE — Progress Notes (Addendum)
PROGRESS NOTE    Kayla Mack  OEU:[id number] DOB: 01/22/1946 DOA: 04/02/2018 PCP: Jani Gravel, MD   Brief Narrative:  HPI on 04/02/18 by Ms. Sharene Butters, PA Kayla Mack is a 72 y.o. 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 Karsten Ro supervision, headache, depression, hyperlipidemia, hypertension, presenting with worsening shortness of breath for the last couple of days, not improved with her home therapy.  She also reported brown sputum, but denies any hemoptysis.  She denies any chest pain, palpitations, fever or chills.  She denies any sick contacts.  The patient denies any nausea or vomiting.  She denies any diarrhea or other associated symptoms.  She denies any rashes.  Lower extremity swelling, or calf pain.  Patient denies any tobacco use (quit in 2018), recreational drug use or alcohol.  Denies any new over-the-counter products, or herbal products.  She called EMS, says she reported increased wheezing.  She denies any new environmental allergens in her house.  Is up-to-date with pneumonia and flu vaccine   Assessment & Plan   Acute on chronic hypoxic hypercapnic respiratory failure secondary to COPD exacerbation -Patient on 4 L of home oxygen at baseline and follows with pulmonology, Dr. Malvin Johns -Upon admission, patient was noted to have an ABG being acidosis with hypercarbia.  O2 was also noted to be in the 80s while on 4 L. -Chest x-ray reviewed and showed COPD, ?atypical infection/bronchitis -Patient required BiPAP on admission however has since been transitioned to 4 L of nasal cannula -Continue nebulizer treatments, Solu-Medrol, -Respiratory viral panel unremarkable  -PT/OT consulted  Anxiety/depression -Stable continue Paxil  Essential hypertension Stable, continue bisoprolol  Hyperlipidemia -Continue statin  History of coronary artery disease -Chest  pain-free -Noted to have cardiac catheterization 05/09/2017 showing moderate PAH -Troponin unremarkable -Continue Plavix, bisoprolol, statin  Leukocytosis -Resolved -likely reactive vs secondary to atypical infection -CXR as above -UA unremarkable  Anemia, normocytic  -hemoglobin currently 10.7, although baseline does appear to be 12 -Drop in hemoglobin possibly dilutional patient did receive IV fluids -Will obtain anemia panel continue to monitor closely -No complaints of hematochezia or melena or hemoptysis  DVT Prophylaxis  lovenox  Code Status: Full  Family Communication: None at bedside  Disposition Plan: Admitted, dispo likely home pending improvement  Consultants None  Procedures  None  Antibiotics   Anti-infectives (From admission, onward)   Start     Dose/Rate Route Frequency Ordered Stop   04/02/18 0400  azithromycin (ZITHROMAX) tablet 500 mg     500 mg Oral  Once 04/02/18 0358        Subjective:   Elesa Crislip seen and examined today.  Feels breathing has mildly improved, but feels weak and continues to have cough. Denies chest pain, abdominal pain, nausea vomiting, diarrhea constipation.  Objective:   Vitals:   04/02/18 2156 04/03/18 0005 04/03/18 0300 04/03/18 0835  BP:  130/69 (!) 142/75   Pulse:      Resp:  18 16   Temp:  (!) 97.5 F (36.4 C) 98 F (36.7 C)   TempSrc:  Oral Oral   SpO2: 95% 95% 95% 95%  Weight:      Height:        Intake/Output Summary (Last 24 hours) at 04/03/2018 1507 Last data filed at 04/03/2018 0900 Gross per 24 hour  Intake 926.67 ml  Output -  Net 926.67 ml   Filed Weights   04/02/18 1800  Weight: 61.4 kg (135 lb 5.8 oz)    Exam  General: Well developed, well nourished, NAD, appears stated age  31: NCAT,  mucous membranes moist.   Neck: Supple  Cardiovascular: S1 S2 auscultated, no rubs, murmurs or gallops. Regular rate and rhythm.  Respiratory: Diminished however clear, no active  wheezing  Abdomen: Soft, nontender, nondistended, + bowel sounds  Extremities: warm dry without cyanosis clubbing or edema  Neuro: AAOx3, nonfocal  Skin: Without rashes exudates or nodules  Psych: Normal affect and demeanor with intact judgement and insight   Data Reviewed: I have personally reviewed following labs and imaging studies  CBC: Recent Labs  Lab 04/02/18 0400 04/03/18 0336  WBC 12.8* 10.8*  NEUTROABS 6.1  --   HGB 12.4 10.7*  HCT 41.2 34.0*  MCV 97.9 95.0  PLT 446* 854   Basic Metabolic Panel: Recent Labs  Lab 04/02/18 0400 04/03/18 0336 04/03/18 0720  NA 137 139  --   K 5.0 5.6* 4.8  CL 102 105  --   CO2 24 26  --   GLUCOSE 156* 133*  --   BUN 14 15  --   CREATININE 0.85 0.75  --   CALCIUM 8.7* 9.0  --    GFR: Estimated Creatinine Clearance: 54 mL/min (by C-G formula based on SCr of 0.75 mg/dL). Liver Function Tests: Recent Labs  Lab 04/02/18 0400  AST 43*  ALT 19  ALKPHOS 87  BILITOT 0.5  PROT 7.3  ALBUMIN 3.8   No results for input(s): LIPASE, AMYLASE in the last 168 hours. No results for input(s): AMMONIA in the last 168 hours. Coagulation Profile: No results for input(s): INR, PROTIME in the last 168 hours. Cardiac Enzymes: Recent Labs  Lab 04/02/18 0400  TROPONINI <0.03   BNP (last 3 results) No results for input(s): PROBNP in the last 8760 hours. HbA1C: No results for input(s): HGBA1C in the last 72 hours. CBG: Recent Labs  Lab 04/03/18 0831  GLUCAP 162*   Lipid Profile: No results for input(s): CHOL, HDL, LDLCALC, TRIG, CHOLHDL, LDLDIRECT in the last 72 hours. Thyroid Function Tests: No results for input(s): TSH, T4TOTAL, FREET4, T3FREE, THYROIDAB in the last 72 hours. Anemia Panel: No results for input(s): VITAMINB12, FOLATE, FERRITIN, TIBC, IRON, RETICCTPCT in the last 72 hours. Urine analysis:    Component Value Date/Time   COLORURINE STRAW (A) 04/03/2018 0554   APPEARANCEUR CLEAR 04/03/2018 0554   LABSPEC  1.006 04/03/2018 0554   PHURINE 6.0 04/03/2018 0554   GLUCOSEU NEGATIVE 04/03/2018 0554   HGBUR NEGATIVE 04/03/2018 0554   BILIRUBINUR NEGATIVE 04/03/2018 0554   KETONESUR NEGATIVE 04/03/2018 0554   PROTEINUR NEGATIVE 04/03/2018 0554   UROBILINOGEN 0.2 06/02/2013 2253   NITRITE NEGATIVE 04/03/2018 0554   LEUKOCYTESUR NEGATIVE 04/03/2018 0554   Sepsis Labs: _0 (procalcitonin:4,lacticidven:4)  ) Recent Results (from the past 240 hour(s))  MRSA PCR Screening     Status: None   Collection Time: 04/02/18  3:52 PM  Result Value Ref Range Status   MRSA by PCR NEGATIVE NEGATIVE Final    Comment:        The GeneXpert MRSA Assay (FDA approved for NASAL specimens only), is one component of a comprehensive MRSA colonization surveillance program. It is not intended to diagnose MRSA infection nor to guide or monitor treatment for MRSA infections. Performed at [facility], [place] [address], [place] [address]   Respiratory Panel by PCR     Status: None   Collection Time: 04/03/18 10:10  AM  Result Value Ref Range Status   Adenovirus NOT DETECTED NOT DETECTED Final   Coronavirus 229E NOT DETECTED NOT DETECTED Final   Coronavirus HKU1 NOT DETECTED NOT DETECTED Final   Coronavirus NL63 NOT DETECTED NOT DETECTED Final   Coronavirus OC43 NOT DETECTED NOT DETECTED Final   Metapneumovirus NOT DETECTED NOT DETECTED Final   Rhinovirus / Enterovirus NOT DETECTED NOT DETECTED Final   Influenza A NOT DETECTED NOT DETECTED Final   Influenza B NOT DETECTED NOT DETECTED Final   Parainfluenza Virus 1 NOT DETECTED NOT DETECTED Final   Parainfluenza Virus 2 NOT DETECTED NOT DETECTED Final   Parainfluenza Virus 3 NOT DETECTED NOT DETECTED Final   Parainfluenza Virus 4 NOT DETECTED NOT DETECTED Final   Respiratory Syncytial Virus NOT DETECTED NOT DETECTED Final   Bordetella pertussis NOT DETECTED NOT DETECTED Final   Chlamydophila pneumoniae NOT DETECTED NOT DETECTED Final    Mycoplasma pneumoniae NOT DETECTED NOT DETECTED Final    Comment: Performed at [facility], [place] [address] [place], [place] [address]      Radiology Studies: Dg Chest Port 1 View  Result Date: 04/02/2018 CLINICAL DATA:  Respiratory distress. On home oxygen. History of COPD. EXAM: PORTABLE CHEST 1 VIEW COMPARISON:  Chest radiograph May 09, 2017 FINDINGS: Cardiac silhouette is mildly enlarged and unchanged. Calcified aortic arch. Increased interstitial prominence with apical bullous changes and increased lung volumes. No pleural effusion or focal consolidation. No pneumothorax. Osteopenia. RIGHT scapular screw. IMPRESSION: COPD with increased interstitial prominence, possible atypical infection/bronchitis. Mild cardiomegaly. Aortic Atherosclerosis (ICD10-I70.0). Electronically Signed   By: Elon Alas M.D.   On: 04/02/2018 04:20     Scheduled Meds: . azithromycin  500 mg Oral Once  . bisoprolol  5 mg Oral Daily  . calcium-vitamin D  1 tablet Oral BID  . clopidogrel  75 mg Oral Daily  . enoxaparin (LOVENOX) injection  40 mg Subcutaneous Q24H  . PARoxetine  20 mg Oral Daily  . pravastatin  10 mg Oral q1800  . umeclidinium-vilanterol  1 puff Inhalation Daily  . Vitamin D (Ergocalciferol)  50,000 Units Oral Q Sun   Continuous Infusions:   LOS: 1 day   Time Spent in minutes   30 minutes  Tami Blass D.O. on 04/03/2018 at 3:07 PM  Between 7am to 7pm - Pager - [phone]  After 7pm go to [email or web address] - password [email or web address]  And look for the night coverage person covering for me after hours  [facility] Office  [phone]

## 2018-04-03 NOTE — Consult Note (Addendum)
[facility] CM Inpatient Consult   04/03/2018  Kayla Mack [place] 1945/12/25 [id number]     Kayla Mack is active with [facility] program. She is followed by [place] for COPD disease management.  Spoke with Kayla Mack at bedside. She is agreeable to ongoing [place] follow up. States she does not feel like she needs [place] home visits at this time. Kayla Mack states she continues to go to pulmonary rehab and that she is really impressed with the staff there.  Kayla Mack also endorses that she does not really understand how her Anura inhaler works and it's function. Agreeable to [facility] Pharmacist follow up for additional questions and resource.    Kayla Mack also reports she would like a nebulizer machine with nebulizer meds because it "really helped" her while she has been in the hospital.   Obtained a [place] Management written consent. Provided [facility] Care Management folder and contact information.  Made inpatient RNCM and nursing aware of nebulizer med and machine request by patient. Made inpatient RNCM aware [facility] is active.  Will update [place].  Will make referral to [facility] Pharmacist.  Noted Kayla Mack has a low unplanned readmission risk score of 14%. Also [facility] office listed as doing transition of care call post discharge.    Marthenia Rolling, MSN-Ed, RN,BSN [facility] Liaison [phone]

## 2018-04-03 NOTE — Evaluation (Signed)
Physical Therapy Evaluation Patient Details Name: Kayla Mack MRN: [id number] DOB: 09-04-1946 Today's Date: 04/03/2018   History of Present Illness  Kayla Mack is a 72 y.o. 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  She also reported brown sputum, but denies any hemoptysis. Acute hypoxic respiratory failure likely secondary to acute COPD exacerbation  Clinical Impression   Pt admitted with above diagnosis. Pt currently with functional limitations due to the deficits listed below (see PT Problem List). Presents with significant decr functional capacity; We discussed using a Rollator RW to help with decreasing the work of walking and help with carrying supplemental O2 tank;  Pt will benefit from skilled PT to increase their independence and safety with mobility to allow discharge to the venue listed below.       Follow Up Recommendations Other (comment)(continue Outpt Pulmonary Rehab)    Equipment Recommendations       Recommendations for Other Services OT consult(for energy conservation techniques)     Precautions / Restrictions Precautions Precautions: Other (comment) Precaution Comments: Desats with activity      Mobility  Bed Mobility Overal bed mobility: Independent                Transfers Overall transfer level: Independent               General transfer comment: No difficulty observed  Ambulation/Gait Ambulation/Gait assistance: Min guard Gait Distance (Feet): 150 Feet Assistive device: None(and occasionally pushing vitals machine) Gait Pattern/deviations: Step-through pattern Gait velocity: Occasional cues to slow down    General Gait Details: Cues to self-monitor for activity tolerance; Noted UE support  with walking lead to decr work of walking; walked on 3 L O2, and noted O2 sat drop to 84%; incr O2 to 6 L for teh remainder of walk  Stairs            Wheelchair Mobility    Modified Rankin (Stroke Patients Only)       Balance                                             Pertinent Vitals/Pain Pain Assessment: No/denies pain    Home Living Family/patient expects to be discharged to:: Private residence Living Arrangements: Alone Available Help at Discharge: Friend(s);Available PRN/intermittently(takes care of mom) Type of Home: House Home Access: Ramped entrance     Home Layout: One level   Additional Comments: Caregiver for mother, 3 steps to enter Mom's home    Prior Function Level of Independence: Independent         Comments: Drives; goes to [facility]     Hand Dominance   Dominant Hand: Right    Extremity/Trunk Assessment   Upper Extremity Assessment Upper Extremity Assessment: Overall WFL for tasks assessed    Lower Extremity Assessment Lower Extremity Assessment: Generalized weakness(decr functional endurance)       Communication   Communication: No difficulties  Cognition Arousal/Alertness: Awake/alert Behavior During Therapy: WFL for tasks assessed/performed Overall Cognitive Status: Within Functional Limits for tasks assessed  General Comments      Exercises     Assessment/Plan    PT Assessment Patient needs continued PT services  PT Problem List Decreased activity tolerance;Decreased mobility;Decreased knowledge of use of DME;Decreased knowledge of precautions;Cardiopulmonary status limiting activity       PT Treatment Interventions DME instruction;Gait training;Stair training;Functional mobility training;Therapeutic activities;Therapeutic exercise;Patient/family education    PT Goals (Current goals can be found in the Care Plan section)  Acute  Rehab PT Goals Patient Stated Goal: Be able to walk longer distance  PT Goal Formulation: With patient Time For Goal Achievement: 04/17/18 Potential to Achieve Goals: Good    Frequency Min 3X/week   Barriers to discharge        Co-evaluation               AM-PAC PT "6 Clicks" Daily Activity  Outcome Measure Difficulty turning over in bed (including adjusting bedclothes, sheets and blankets)?: None Difficulty moving from lying on back to sitting on the side of the bed? : None Difficulty sitting down on and standing up from a chair with arms (e.g., wheelchair, bedside commode, etc,.)?: None Help needed moving to and from a bed to chair (including a wheelchair)?: None Help needed walking in hospital room?: A Little Help needed climbing 3-5 steps with a railing? : A Little 6 Click Score: 22    End of Session Equipment Utilized During Treatment: Oxygen Activity Tolerance: Patient tolerated treatment well Patient left: in bed;with call bell/phone within reach(sitting EOB) Nurse Communication: Mobility status PT Visit Diagnosis: Other abnormalities of gait and mobility (R26.89);Other (comment)(Decr functional capacity)    Time: 6168-3729 PT Time Calculation (min) (ACUTE ONLY): 32 min   Charges:   PT Evaluation $PT Eval Moderate Complexity: 1 Mod PT Treatments $Gait Training: 8-22 mins   PT G Codes:        Roney Marion, PT  Acute Rehabilitation Services Pager [phone] Office [phone]   [place] 04/03/2018, 4:51 PM

## 2018-04-03 NOTE — Plan of Care (Signed)
Spoke with patient about possible discharge. Patient expresses readiness and spoke about how she feels better than when she was admitted. Patient was then set up for bed. Will continue to monitor and assess patient.

## 2018-04-04 ENCOUNTER — Encounter (HOSPITAL_COMMUNITY): Payer: Self-pay

## 2018-04-04 ENCOUNTER — Telehealth (HOSPITAL_COMMUNITY): Payer: Self-pay | Admitting: Internal Medicine

## 2018-04-04 DIAGNOSIS — F329 Major depressive disorder, single episode, unspecified: Secondary | ICD-10-CM

## 2018-04-04 LAB — CBC
HCT: 36.3 % (ref 36.0–46.0)
Hemoglobin: 11.3 g/dL — ABNORMAL LOW (ref 12.0–15.0)
MCH: 29.4 pg (ref 26.0–34.0)
MCHC: 31.1 g/dL (ref 30.0–36.0)
MCV: 94.5 fL (ref 78.0–100.0)
Platelets: 347 10*3/uL (ref 150–400)
RBC: 3.84 MIL/uL — ABNORMAL LOW (ref 3.87–5.11)
RDW: 12.7 % (ref 11.5–15.5)
WBC: 12.7 10*3/uL — ABNORMAL HIGH (ref 4.0–10.5)

## 2018-04-04 LAB — IRON AND TIBC
Iron: 78 ug/dL (ref 28–170)
Saturation Ratios: 22 % (ref 10.4–31.8)
TIBC: 347 ug/dL (ref 250–450)
UIBC: 269 ug/dL

## 2018-04-04 LAB — FOLATE: Folate: 9.1 ng/mL (ref >5.9)

## 2018-04-04 LAB — RETICULOCYTES
RBC.: 3.84 MIL/uL — ABNORMAL LOW (ref 3.87–5.11)
Retic Count, Absolute: 69.1 10*3/uL (ref 19.0–186.0)
Retic Ct Pct: 1.8 % (ref 0.4–3.1)

## 2018-04-04 LAB — GLUCOSE, CAPILLARY: Glucose-Capillary: 86 mg/dL (ref 65–99)

## 2018-04-04 LAB — VITAMIN B12: Vitamin B-12: 262 pg/mL (ref 180–914)

## 2018-04-04 LAB — FERRITIN: Ferritin: 74 ng/mL (ref 11–307)

## 2018-04-04 MED ORDER — ALBUTEROL SULFATE (2.5 MG/3ML) 0.083% IN NEBU: 2.5000 mg | INHALATION_SOLUTION | RESPIRATORY_TRACT | 3 refills | Status: DC | PRN

## 2018-04-04 MED ORDER — AZITHROMYCIN 250 MG PO TABS: 250.0000 mg | ORAL_TABLET | Freq: Every day | ORAL | 0 refills | Status: AC

## 2018-04-04 MED ORDER — PREDNISONE 20 MG PO TABS: ORAL_TABLET | ORAL | 0 refills | Status: DC

## 2018-04-04 NOTE — Care Management Note (Addendum)
Case Management Note  Patient Details  Name: Kayla Mack MRN: [id number] Date of Birth: 1946/09/06  Subjective/Objective:   From home alone, has home oxygen with lin care, she is for dc today and needs home neb and rolling walker.  Referral given to [facility] with [facility] for DME.                   Action/Plan: DC home when neb and walker received to room.  Expected Discharge Date:  04/04/18               Expected Discharge Plan:  Home/Self Care  In-House Referral:     Discharge planning Services  CM Consult  Post Acute Care Choice:  Durable Medical Equipment Choice offered to:  Patient  DME Arranged:  Nebulizer machine, Walker rolling DME Agency:  [place]:    [place]:     Status of Service:  Completed, signed off  If discussed at [employer] of Stay Meetings, dates discussed:    Additional Comments:  Zenon Mayo, RN 04/04/2018, 11:49 AM

## 2018-04-04 NOTE — Discharge Instructions (Signed)

## 2018-04-04 NOTE — Progress Notes (Signed)
Physical Therapy Treatment Patient Details Name: Kayla Mack MRN: [id number] DOB: 1946-09-07 Today's Date: 04/04/2018    History of Present Illness Kayla Mack is a 72 y.o. 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  She also reported brown sputum, but denies any hemoptysis. Acute hypoxic respiratory failure likely secondary to acute COPD exacerbation    PT Comments    Pt making progress towards her goals today, and was trained in proper Rollator use and perform stair training to insure safety with entering home at d/c. Pt continues to be limited in safe mobility by oxygen desaturation (see General Comments). Pt encouraged to check her oxygen levels often with mobility even if she feels fine. PT recommends Rollator for d/c and continued participation in Outpatient Pulmonary Rehab.     Follow Up Recommendations  Other (comment)(continue Outpt Pulmonary Rehab)     Equipment Recommendations  Rolling walker with 5" wheels    Recommendations for Other Services OT consult(for energy conservation techniques)     Precautions / Restrictions Precautions Precautions: Other (comment) Precaution Comments: Desats with activity Restrictions Weight Bearing Restrictions: No    Mobility  Bed Mobility Overal bed mobility: Independent                Transfers Overall transfer level: Independent               General transfer comment: No difficulty observed  Ambulation/Gait Ambulation/Gait assistance: Min guard Gait Distance (Feet): 250 Feet Assistive device: 4-wheeled walker Gait Pattern/deviations: Step-through pattern Gait velocity: WFL Gait velocity interpretation: >2.62 ft/sec, indicative of community ambulatory General Gait  Details: educated pt on safety features and proper 4-wheeled walker, min guard for ambulation with walker, vc for proximity   Stairs Stairs: Yes Stairs assistance: Min guard Stair Management: Two rails;Forwards;Alternating pattern Number of Stairs: 3 General stair comments: min guard for ascent, with reaching 3rd stair pt turned into oxygen tubing and instead of lifting safely over her head she attempted to step over the tubing, almost catching her foot, pt educated on need for safe mobility and keeping feet on ground especially on stairs to prevent falls, safe, stedy descent with bilateral rail use         Balance Overall balance assessment: No apparent balance deficits (not formally assessed)                                          Cognition Arousal/Alertness: Awake/alert Behavior During Therapy: WFL for tasks assessed/performed Overall Cognitive Status: Within Functional Limits for tasks assessed                                        Exercises      General Comments General comments (skin integrity, edema, etc.): pt on 4L O2 at rest in room, SaO2 94%, with ambulation SaO2 dropped to 81% O2, pt states that in pulmonary rehab she utilizes 8-10L/min O2, PT educated that walking long distances and climbing stairs should be treated in a similar manner to rehab and oxygen should be adjusted accordingly, pt encouraged to check O2 levels often,  supplemental O2 increased to 8L  for stair tranining and pt able to maintain SaO2 >90%O2      Pertinent Vitals/Pain Pain Assessment: No/denies pain           PT Goals (current goals can now be found in the care plan section) Acute Rehab PT Goals Patient Stated Goal: Be able to walk longer distance  PT Goal Formulation: With patient Time For Goal Achievement: 04/17/18 Potential to Achieve Goals: Good Progress towards PT goals: Progressing toward goals    Frequency    Min 3X/week      PT Plan  Equipment recommendations need to be updated       AM-PAC PT "6 Clicks" Daily Activity  Outcome Measure  Difficulty turning over in bed (including adjusting bedclothes, sheets and blankets)?: None Difficulty moving from lying on back to sitting on the side of the bed? : None Difficulty sitting down on and standing up from a chair with arms (e.g., wheelchair, bedside commode, etc,.)?: None Help needed moving to and from a bed to chair (including a wheelchair)?: None Help needed walking in hospital room?: A Little Help needed climbing 3-5 steps with a railing? : A Little 6 Click Score: 22    End of Session Equipment Utilized During Treatment: Oxygen Activity Tolerance: Patient tolerated treatment well Patient left: in bed;with call bell/phone within reach(sitting EOB) Nurse Communication: Mobility status PT Visit Diagnosis: Other abnormalities of gait and mobility (R26.89);Other (comment)(Decr functional capacity)     Time: 1470-9295 PT Time Calculation (min) (ACUTE ONLY): 24 min  Charges:  $Gait Training: 23-37 mins                    G Codes:       Haseeb Fiallos B. Migdalia Dk PT, DPT Acute Rehabilitation  [phone] Pager [phone]     [place] 04/04/2018, 11:20 AM

## 2018-04-04 NOTE — Discharge Summary (Signed)
Physician Discharge Summary  Kayla Mack CHE:[id number] DOB: Jul 02, 1946 DOA: 04/02/2018  PCP: Kayla Gravel, MD  Admit date: 04/02/2018 Discharge date: 04/04/2018  Time spent: 45 minutes  Recommendations for Outpatient Follow-up:  Patient will be discharged to home. Continue pulmonary rehab. Patient will need to follow up with primary care provider within one week of discharge.  Follow up with Dr. Lamonte Sakai, pulmonology. Patient should continue medications as prescribed.  Patient should follow a regular diet.   Discharge Diagnoses:  Acute on chronic hypoxic hypercapnic respiratory failure secondary to COPD exacerbation Anxiety/depression Essential hypertension Hyperlipidemia History of coronary artery disease Leukocytosis Anemia, normocytic   Discharge Condition: Stable  Diet recommendation: regular  Filed Weights   04/02/18 1800  Weight: 61.4 kg (135 lb 5.8 oz)    History of present illness:  on 04/02/18 by Ms. Sharene Butters, PA Payton Doughty Blumenheinis a 72 y.o.femalewith medical history significant forCOPD/ centrilobular emphysemaon 4 L at home, attending pulmonary rehab, last on 03/29/2018 by Dr. Karsten Ro supervision, headache, depression, hyperlipidemia, hypertension, presenting with worsening shortness of breath for the last couple of days, not improved with her home therapy. She also reported brown sputum, but denies any hemoptysis. She denies any chest pain, palpitations, fever or chills. She denies any sick contacts. The patient denies any nausea or vomiting. She denies any diarrhea or other associated symptoms. She denies any rashes. Lower extremity swelling, or calf pain. Patient denies any tobacco use (quit in 2018), recreational drug use or alcohol. Denies any new over-the-counter products, or herbal products. She called EMS, says she reported increased wheezing. She denies any new environmental allergens in her house. Is up-to-date with pneumonia and flu  vaccine  Hospital Course:  Acute on chronic hypoxic hypercapnic respiratory failure secondary to COPD exacerbation -Patient on 4 L of home oxygen at baseline and follows with pulmonology, Dr. Malvin Johns -Upon admission, patient was noted to have an ABG being acidosis with hypercarbia.  O2 was also noted to be in the 80s while on 4 L. -Chest x-ray reviewed and showed COPD, ?atypical infection/bronchitis -Patient required BiPAP on admission however has since been transitioned to 4 L of nasal cannula -Continue nebulizer treatments, Solu-Medrol -Respiratory viral panel unremarkable  -PT/OT consulted- recommended continuing outpatient pulmonary rehab -will discharge patient with nebulizer and nebs, steroid taper, azithromycin -Continue Trelegy -follow up pulmonary; discussed briefly with Dr. Lamonte Sakai, he will make a follow up appt for patient  Anxiety/depression -Stable continue Paxil  Essential hypertension Stable, continue bisoprolol  Hyperlipidemia -Continue statin  History of coronary artery disease -Chest pain-free -Noted to have cardiac catheterization 05/09/2017 showing moderate PAH -Troponin unremarkable -Continue Plavix, bisoprolol, statin  Leukocytosis -Resolved -likely reactive vs secondary to atypical infection -CXR as above -UA unremarkable  Anemia, normocytic  -hemoglobin currently 11.3, baseline does appear to be 12 -Drop in hemoglobin possibly dilutional patient did receive IV fluids -anemia panel shows adequate iron and stores -No complaints of hematochezia or melena or hemoptysis  Procedures: None  Consultations: None  Discharge Exam: Vitals:   04/03/18 2324 04/04/18 0750  BP: (!) 145/92 126/72  Pulse: 71 68  Resp: 17 18  Temp: 98 F (36.7 C) 98.1 F (36.7 C)  SpO2: 94% 94%   Patient feeling better today. Feels breathing has improved. Continues to have cough. Denies chest pain, abdominal pain, nausea, vomiting, diarrhea, constipation.     General: Well developed, well nourished, NAD, appears stated age  HEENT: NCAT, mucous membranes moist.  Neck: Supple  Cardiovascular: S1 S2 auscultated, no rubs,  murmurs or gallops. Regular rate and rhythm.  Respiratory: diminished but clear, no wheezing   Abdomen: Soft, nontender, nondistended, + bowel sounds  Extremities: warm dry without cyanosis clubbing or edema  Neuro: AAOx3, nonfocal  Psych: Normal affect and demeanor with intact judgement and insight, pleasant   Discharge Instructions Discharge Instructions    AMB Referral to [facility]   Complete by:  As directed    Please assign to [facility] Pharmacist for medication education related to inhalers and potential medication affordability for inhalers. Currently active with [facility]. Currently inpatient at [facility]. Continues with low risk score for unplanned readmission of 14%. Please call with questions. Thanks .Marthenia Rolling, [place], [facility] Liaison-[phone]   Reason for consult:  PLease assign to [facility] Pharmacist   Diagnoses of:  COPD/ Pneumonia   Expected date of contact:  1-3 days (reserved for hospital discharges)   Discharge instructions   Complete by:  As directed    Patient will be discharged to home.  Patient will need to follow up with primary care provider within one week of discharge.  Follow up with Dr. Lamonte Sakai, pulmonology. Patient should continue medications as prescribed.  Patient should follow a regular diet.     Allergies as of 04/04/2018      Reactions   Aleve [naproxen Sodium] Hives   Aspirin Swelling   Facial swelling   Penicillins Hives, Swelling   Has patient had a PCN reaction causing immediate rash, facial/tongue/throat swelling, SOB or lightheadedness with hypotension: Yes Has patient had a PCN reaction causing severe rash involving mucus membranes or skin necrosis: Yes Has patient had a PCN reaction that required hospitalization: Unk Has patient  had a PCN reaction occurring within the last 10 years: No If all of the above answers are "NO", then may proceed with Cephalosporin use.      Medication List    STOP taking these medications   ANORO ELLIPTA 62.5-25 MCG/INH Aepb Generic drug:  umeclidinium-vilanterol     TAKE these medications   acetaminophen 500 MG tablet Commonly known as:  TYLENOL Take 1,000 mg by mouth 2 (two) times daily as needed for pain.   azithromycin 250 MG tablet Commonly known as:  ZITHROMAX Take 1 tablet (250 mg total) by mouth daily for 5 days.   bisoprolol 5 MG tablet Commonly known as:  ZEBETA Take 1 tablet (5 mg total) by mouth daily.   buPROPion 150 MG 12 hr tablet Commonly known as:  WELLBUTRIN SR Take 150 mg by mouth daily.   calcium-vitamin D 500-200 MG-UNIT tablet Commonly known as:  OSCAL WITH D Take 1 tablet by mouth 2 (two) times daily.   clonazePAM 0.5 MG tablet Commonly known as:  KLONOPIN Take 1 tablet (0.5 mg total) by mouth 2 (two) times daily as needed for anxiety. What changed:  when to take this   clopidogrel 75 MG tablet Commonly known as:  PLAVIX Take 1 tablet (75 mg total) by mouth daily.   diphenhydrAMINE 25 mg capsule Commonly known as:  BENADRYL Take 25 mg by mouth as needed for itching.   feeding supplement (ENSURE ENLIVE) Liqd Take 237 mLs by mouth 2 (two) times daily between meals.   Fluticasone-Umeclidin-Vilant 100-62.5-25 MCG/INH Aepb Commonly known as:  TRELEGY ELLIPTA Inhale 1 puff into the lungs daily.   lovastatin 40 MG tablet Commonly known as:  MEVACOR Take 40 mg by mouth every morning.   PARoxetine 20 MG tablet Commonly known as:  PAXIL  Take 20 mg by mouth daily.   predniSONE 20 MG tablet Commonly known as:  DELTASONE Take 3 tabs x 2 days, then take 2 tabs x 2 days, then take 1 tab x 2 days.   sodium chloride 0.65 % Soln nasal spray Commonly known as:  OCEAN Place 1 spray into both nostrils as needed for congestion.   VENTOLIN HFA  IN Inhale 2 puffs into the lungs 4 (four) times daily as needed. What changed:  Another medication with the same name was added. Make sure you understand how and when to take each.   VENTOLIN HFA 108 (90 Base) MCG/ACT inhaler Generic drug:  albuterol What changed:  Another medication with the same name was added. Make sure you understand how and when to take each.   albuterol (2.5 MG/3ML) 0.083% nebulizer solution Commonly known as:  PROVENTIL Take 3 mLs (2.5 mg total) by nebulization every 4 (four) hours as needed for wheezing or shortness of breath. What changed:  You were already taking a medication with the same name, and this prescription was added. Make sure you understand how and when to take each.   Vitamin D (Ergocalciferol) 50000 units Caps capsule Commonly known as:  DRISDOL Take 50,000 Units by mouth every 7 (seven) days.            Durable Medical Equipment  (From admission, onward)        Start     Ordered   04/04/18 1004  For home use only DME Nebulizer machine  Once    Question:  Patient needs a nebulizer to treat with the following condition  Answer:  COPD (chronic obstructive pulmonary disease) ([place])   04/04/18 1003   04/04/18 1004  For home use only DME Nebulizer/meds  Once    Question:  Patient needs a nebulizer to treat with the following condition  Answer:  COPD (chronic obstructive pulmonary disease) ([place])   04/04/18 1003     Allergies  Allergen Reactions  . Aleve [Naproxen Sodium] Hives  . Aspirin Swelling    Facial swelling  . Penicillins Hives and Swelling    Has patient had a PCN reaction causing immediate rash, facial/tongue/throat swelling, SOB or lightheadedness with hypotension: Yes Has patient had a PCN reaction causing severe rash involving mucus membranes or skin necrosis: Yes Has patient had a PCN reaction that required hospitalization: Unk Has patient had a PCN reaction occurring within the last 10 years: No If all of the above answers  are "NO", then may proceed with Cephalosporin use.    Follow-up Information    Collene Gobble, MD Follow up on 05/11/2018.   Specialty:  Pulmonary Disease Why:  3:15 pm Contact information: [address] [place] [address] [phone]        Kayla Gravel, MD. Schedule an appointment as soon as possible for a visit in 1 week(s).   Specialty:  Internal Medicine Why:  Hospital follow up Contact information: [address] [place] [address] [phone]            The results of significant diagnostics from this hospitalization (including imaging, microbiology, ancillary and laboratory) are listed below for reference.    Significant Diagnostic Studies: Dg Chest Port 1 View  Result Date: 04/02/2018 CLINICAL DATA:  Respiratory distress. On home oxygen. History of COPD. EXAM: PORTABLE CHEST 1 VIEW COMPARISON:  Chest radiograph May 09, 2017 FINDINGS: Cardiac silhouette is mildly enlarged and unchanged. Calcified aortic arch. Increased interstitial prominence with apical  bullous changes and increased lung volumes. No pleural effusion or focal consolidation. No pneumothorax. Osteopenia. RIGHT scapular screw. IMPRESSION: COPD with increased interstitial prominence, possible atypical infection/bronchitis. Mild cardiomegaly. Aortic Atherosclerosis (ICD10-I70.0). Electronically Signed   By: Elon Alas M.D.   On: 04/02/2018 04:20    Microbiology: Recent Results (from the past 240 hour(s))  MRSA PCR Screening     Status: None   Collection Time: 04/02/18  3:52 PM  Result Value Ref Range Status   MRSA by PCR NEGATIVE NEGATIVE Final    Comment:        The GeneXpert MRSA Assay (FDA approved for NASAL specimens only), is one component of a comprehensive MRSA colonization surveillance program. It is not intended to diagnose MRSA infection nor to guide or monitor treatment for MRSA infections. Performed at [facility], [place] [address],  [place], [place] [address]   Respiratory Panel by PCR     Status: None   Collection Time: 04/03/18 10:10 AM  Result Value Ref Range Status   Adenovirus NOT DETECTED NOT DETECTED Final   Coronavirus 229E NOT DETECTED NOT DETECTED Final   Coronavirus HKU1 NOT DETECTED NOT DETECTED Final   Coronavirus NL63 NOT DETECTED NOT DETECTED Final   Coronavirus OC43 NOT DETECTED NOT DETECTED Final   Metapneumovirus NOT DETECTED NOT DETECTED Final   Rhinovirus / Enterovirus NOT DETECTED NOT DETECTED Final   Influenza A NOT DETECTED NOT DETECTED Final   Influenza B NOT DETECTED NOT DETECTED Final   Parainfluenza Virus 1 NOT DETECTED NOT DETECTED Final   Parainfluenza Virus 2 NOT DETECTED NOT DETECTED Final   Parainfluenza Virus 3 NOT DETECTED NOT DETECTED Final   Parainfluenza Virus 4 NOT DETECTED NOT DETECTED Final   Respiratory Syncytial Virus NOT DETECTED NOT DETECTED Final   Bordetella pertussis NOT DETECTED NOT DETECTED Final   Chlamydophila pneumoniae NOT DETECTED NOT DETECTED Final   Mycoplasma pneumoniae NOT DETECTED NOT DETECTED Final    Comment: Performed at [facility], [place] [address] [place], [place] [address]     Labs: Basic Metabolic Panel: Recent Labs  Lab 04/02/18 0400 04/03/18 0336 04/03/18 0720  NA 137 139  --   K 5.0 5.6* 4.8  CL 102 105  --   CO2 24 26  --   GLUCOSE 156* 133*  --   BUN 14 15  --   CREATININE 0.85 0.75  --   CALCIUM 8.7* 9.0  --    Liver Function Tests: Recent Labs  Lab 04/02/18 0400  AST 43*  ALT 19  ALKPHOS 87  BILITOT 0.5  PROT 7.3  ALBUMIN 3.8   No results for input(s): LIPASE, AMYLASE in the last 168 hours. No results for input(s): AMMONIA in the last 168 hours. CBC: Recent Labs  Lab 04/02/18 0400 04/03/18 0336 04/04/18 0416  WBC 12.8* 10.8* 12.7*  NEUTROABS 6.1  --   --   HGB 12.4 10.7* 11.3*  HCT 41.2 34.0* 36.3  MCV 97.9 95.0 94.5  PLT 446* 333 347   Cardiac Enzymes: Recent Labs  Lab 04/02/18 0400  TROPONINI <0.03    BNP: BNP (last 3 results) Recent Labs    04/02/18 0400  BNP 131.2*    ProBNP (last 3 results) No results for input(s): PROBNP in the last 8760 hours.  CBG: Recent Labs  Lab 04/03/18 0831 04/04/18 0748  GLUCAP 162* 86       Signed:  Khris Jansson  [facility] 04/04/2018, 10:40 AM

## 2018-04-05 ENCOUNTER — Other Ambulatory Visit: Payer: Self-pay | Admitting: Pharmacist

## 2018-04-05 NOTE — Patient Outreach (Signed)
[facility]) Care Management  04/05/2018  SUSA BONES 1946-07-02 [id number]  72 year old female followed by [facility] for COPD disease management.  Patient recently hospitalized for acute COPD exacerbation.  [facility] inpatient liaison spoke with patient and made referral for [facility] for education with Anora Ellipta inhaler and assistance with obtaining a nebulizer machine and nebulizer medications.  PMHx includes, but not limited to, COPD on 4L 02 at home, pulmonary HTN, depression, HTN, HLD, and nonobstructive CAD.  Patient discharge instructions recommended PCP follow-up within 1 week and to schedule appointment with pulmonologist.  Per notes, patient had nebulizer machine order placed.    Successful call placed to Ms. Marinell Blight. HIPAA identifiers verified. Patient is agreeable to review medications.    CC: "I just feel confused about things."   Patient reports she was delivered nebulizer machine from [facility] but does not know how to use it.  She states she is planning on bringing it to her PT session tomorrow because there are several nurses there that can help her.   She reports she previously was on Anoro Ellipta but was changed to Trelegy due to cost.  She has been using sample of Trelegy for several months and has not had to have it filled from her pharmacy.  She reports she also has a Ventolin inhaler and albuterol nebulizers but doesn't know when to use these.   She reports she tried to pick up her antibiotic and steroid at the pharmacy today but was told there were problems and could not be dispensed.    Objective:  Medications Reviewed Today    Reviewed by Myna Bright, RN (Registered Nurse) on 04/02/18 at 1608  Med List Status: Complete  Medication Order Taking? Sig Documenting Provider Last Dose Status Informant  acetaminophen (TYLENOL) 500 MG tablet [id number] Yes Take 1,000 mg by mouth 2 (two) times daily as needed for pain.  [provider] Past Week Unknown time Active Self  Albuterol Sulfate (VENTOLIN HFA IN) [id number] Yes Inhale 2 puffs into the lungs 4 (four) times daily as needed. [provider] 04/01/2018 Unknown time Active Self  ANORO ELLIPTA 62.5-25 MCG/INH AEPB [id number] Yes Inhale 1 puff into the lungs daily. [provider] 04/01/2018 Unknown time Active Self  bisoprolol (ZEBETA) 5 MG tablet [id number] Yes Take 1 tablet (5 mg total) by mouth daily. Caroline More, DO 04/01/2018 Unknown time Active Self  buPROPion (WELLBUTRIN SR) 150 MG 12 hr tablet [id number] No Take 150 mg by mouth daily. [provider] Not Taking Unknown time Active Self  calcium-vitamin D (OSCAL WITH D) 500-200 MG-UNIT per tablet [id number] Yes Take 1 tablet by mouth 2 (two) times daily. [place], Estill Bakes, NP 04/01/2018 Unknown time Active Self  clonazePAM (KLONOPIN) 0.5 MG tablet [id number] Yes Take 1 tablet (0.5 mg total) by mouth 2 (two) times daily as needed for anxiety.  Patient taking differently:  Take 0.5 mg by mouth daily as needed for anxiety.    Erby Pian, NP 04/01/2018 Unknown time Active Self  clopidogrel (PLAVIX) 75 MG tablet [id number] Yes Take 1 tablet (75 mg total) by mouth daily. Caroline More, DO 04/01/2018 Unknown time Active Self  diphenhydrAMINE (BENADRYL) 25 mg capsule [id number] Yes Take 25 mg by mouth as needed for itching. [provider] Past Week Unknown time Active Self  feeding supplement, ENSURE ENLIVE, (ENSURE ENLIVE) LIQD [id number]  Take 237 mLs by mouth 2 (two) times daily between meals.  Patient not taking:  Reported  on 02/03/2018   Caroline More, DO  Active Self  Fluticasone-Umeclidin-Vilant (TRELEGY ELLIPTA) 100-62.5-25 MCG/INH AEPB [id number] Yes Inhale 1 puff into the lungs daily. Collene Gobble, MD 04/01/2018 Unknown time Active Self  lovastatin (MEVACOR) 40 MG tablet [id number] Yes Take 40 mg by mouth every morning. [provider] 04/01/2018 Unknown time Active Self   PARoxetine (PAXIL) 20 MG tablet [id number] Yes Take 20 mg by mouth daily. [provider] 04/01/2018 Unknown time Active Self  sodium chloride (OCEAN) 0.65 % SOLN nasal spray [id number] Yes Place 1 spray into both nostrils as needed for congestion. [provider] Past Month Unknown time Active Self  VENTOLIN HFA 108 (90 Base) MCG/ACT inhaler [id number] No  [provider] Not Taking Unknown time Active Self  Vitamin D, Ergocalciferol, (DRISDOL) 50000 units CAPS capsule [id number] Yes Take 50,000 Units by mouth every 7 (seven) days. [provider] 03/26/2018 Active Self           Med Note Nicki Reaper, MELISSA A   Wed May 04, 2017 10:34 AM) Sundays            Drugs sorted by system:  Neurologic/Psychologic: clonazepam, diphenhydramine, paroxetine  Cardiovascular: bisoprolol, clopidogrel, lovastatin  Pulmonary/Allergy:albuterol nebulizer and inhaler, Trelegy inhaler  Gastrointestinal: none  Endocrine:prednisone  Renal: none  Topical:sodium chloride nasal spray  Pain: acetaminophen  Vitamins/Minerals: calcium-vitamin D, vitamin D 50,000 units  Infectious Diseases: azithromycin  Miscellaneous: none   Duplications in therapy: Gaps in therapy:  Medications to avoid in the elderly:  Paroxetine: This medication has been identified in the Beers Criteria as a potentially inappropriate medication to be avoided in patients 65 years and older (independent of diagnosis or condition) due to its strong anticholinergic properties and potential for sedation and orthostatic hypotension. Because of their association with falls and fractures, the selective serotonin reuptake inhibitors (SSRIs) should be avoided in patients 65 years and older with a history of falls or fractures. In addition, use the SSRIs with caution due to their potential to cause or exacerbate syndrome of inappropriate antidiuretic hormone secretion (SIADH) or hyponatremia; monitor sodium closely with  initiation or dosage adjustments in older adults (Beers Criteria [AGS 2015]). Per Beers list, medications with strong anticholinergic properties should be avoided in older adults with dementia and cognitive impairment, those with or at high risk of delirium, and those with lower urinary tract symptoms including benign prostatic hyperplasia.   Diphenhydramine: Per the Beers List, the antihistamine in this medication is highly anticholinergic, clearance may be reduced with advanced age, tolerance may develop when used as a hypnotic and should be avoided in the elderly. Antihistamine use in the elderly poses a greater risk of confusion, dry mouth, constipation, or other anticholinergic effects or toxicity. Per Beers list, medications with strong anticholinergic properties should be avoided in older adults with dementia and cognitive impairment, those with or at high risk of delirium, and those with lower urinary tract symptoms, including benign prostatic hyperplasia.   Drug interactions:  Other issues noted:  Medication management: Patient is confused about pulmonary medications.  We reviewed indications, dosing, side effects, and administration technique for both rescue inhaler and nebulizer vs maintenance inhaler.  Patient voiced understanding.    Patient has not started antibiotic and steroid post-discharge. Care coordination call placed to [place].  Instructions for azithromycin clarified as 264m qday x 5 days, predinsone ready for pick-up.  Patient will return to pharmacy this afternoon so she can start these medications.  I counseled patient on side effects and  dosing.    Patient having difficulty paying for Trelegy.  Insurance is [employer] PDP.  Per plan documents, patient has a 25% co-insurance for all Tier 3 medications.  I reviewed [employer] resources with patient and recommended that she speak with representative regarding insurance for 2020 as [employer] can assist with choosing best  plan based on medication and costs.  I provided phone number to patient who verbalized understanding.  She reports that she will call them in October for open enrollment.  She states she will continue to use samples for Trelegy and will pay out of pocket when necessary for inhaler.  Trelegy PAP through [facility] requires $600 TROOP.  Patient does not believe she has spent this TROOP yet in 2019.   Plan: I will follow-up with patient on Friday to make sure she understands how to use nebulizer machine.  If no further medication issues, I will close case .  Ralene Bathe, PharmD, [place] [phone]

## 2018-04-06 ENCOUNTER — Ambulatory Visit: Payer: Self-pay

## 2018-04-06 ENCOUNTER — Encounter (HOSPITAL_COMMUNITY)
Admission: RE | Admit: 2018-04-06 | Discharge: 2018-04-06 | Disposition: A | Discharge status: Home / Self Care | Payer: Self-pay | Source: Ambulatory Visit | Attending: Emergency Medicine | Admitting: Emergency Medicine

## 2018-04-06 ENCOUNTER — Other Ambulatory Visit: Payer: Self-pay

## 2018-04-06 NOTE — Patient Outreach (Signed)
[facility]) Care Management  04/06/2018  Navi Ewton Renstrom Aug 18, 1946 [id number]   !st outreach to the patient for assessment. The patient answered the phone but was unable to talk. She asked if I could call her back at a different time.  Plan: RN Health Coach will make another outreach attempt to the patient within four business days.    Lazaro Arms RN, BSN, [place] Direct Dial:  [phone]  Fax: [phone]

## 2018-04-07 ENCOUNTER — Other Ambulatory Visit: Payer: Self-pay | Admitting: Pharmacist

## 2018-04-07 ENCOUNTER — Ambulatory Visit: Payer: Self-pay | Admitting: Pharmacist

## 2018-04-07 NOTE — Patient Outreach (Signed)
[facility]) Care Management  04/07/2018  Briah Nary Vanalstine Oct 01, 1946 [id number]   Unsuccessful outreach call to Ms. Jabs.  I left a HIPAA compliant voicemail on her home phone.   Plan: I will follow-up with patient in 1-2 weeks regarding nebulizer.   Ralene Bathe, PharmD, [place] [phone]
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
## 2018-04-10 ENCOUNTER — Ambulatory Visit (INDEPENDENT_AMBULATORY_CARE_PROVIDER_SITE_OTHER): Payer: Medicare Other | Admitting: Pulmonary Disease

## 2018-04-10 ENCOUNTER — Encounter: Payer: Self-pay | Admitting: Pulmonary Disease

## 2018-04-10 VITALS — BP 138/70 | HR 64 | Ht 62.0 in | Wt 133.6 lb

## 2018-04-10 DIAGNOSIS — F17200 Nicotine dependence, unspecified, uncomplicated: Secondary | ICD-10-CM

## 2018-04-10 DIAGNOSIS — J9611 Chronic respiratory failure with hypoxia: Secondary | ICD-10-CM | POA: Diagnosis not present

## 2018-04-10 DIAGNOSIS — J449 Chronic obstructive pulmonary disease, unspecified: Secondary | ICD-10-CM

## 2018-04-10 MED ORDER — FLUTICASONE-UMECLIDIN-VILANT 100-62.5-25 MCG/INH IN AEPB: 1.0000 | INHALATION_SPRAY | Freq: Every day | RESPIRATORY_TRACT | 0 refills | Status: DC

## 2018-04-10 NOTE — Assessment & Plan Note (Addendum)
Continue Trelegy  >>> 1 puff daily, rinse your mouth out after use   Continue Oxygen Therapy as prescribed  >>>4L at rest  >>>10L with exercise   Continue nebulizers as needed every 4 hours for shortness of breath or wheezing  Finish your prednisone  Finish your antibiotics  Continue pulmonary rehab  [place] for me program  >>> get print off from pharmacy for total spending in 2019 >>> Complete this paperwork as soon as possible and drop back off in our office  If you ever have issues affording your medications, unable to take your medications, or do not have your medications especially your respiratory medications please contact our office.  If you have fever, oxygen needs are increasing, unable to maintain oxygen saturations greater than 88%, or any concerns regarding your respiratory status please follow-up with our office.

## 2018-04-10 NOTE — Assessment & Plan Note (Signed)
Continue not smoking

## 2018-04-10 NOTE — Patient Instructions (Addendum)
Continue Trelegy  >>> 1 puff daily, rinse your mouth out after use   Continue Oxygen Therapy as prescribed  >>>4L at rest  >>>10L with exercise   Continue nebulizers as needed every 4 hours for shortness of breath or wheezing  Finish your prednisone  Finish your antibiotics   [place] for me program  >>> get print off from pharmacy for total spending in 2019 >>> Complete this paperwork as soon as possible and drop back off in our office  If you ever have issues affording your medications, unable to take your medications, or do not have your medications especially your respiratory medications please contact our office.  If you have fever, oxygen needs are increasing, unable to maintain oxygen saturations greater than 88%, or any concerns regarding your respiratory status please follow-up with our office.  Keep your one-month follow-up with Dr. Lamonte Sakai     Please contact the office if your symptoms worsen or you have concerns that you are not improving.   Thank you for choosing [facility] for your healthcare, and for allowing [place] to partner with you on your healthcare journey. I am thankful to be able to provide care to you today.   Wyn Quaker FNP-C

## 2018-04-10 NOTE — Assessment & Plan Note (Signed)
  Continue Oxygen Therapy as prescribed  >>>4L at rest  >>>10L with exercise   If you ever have issues affording your medications, unable to take your medications, or do not have your medications especially your respiratory medications please contact our office.  If you have fever, oxygen needs are increasing, unable to maintain oxygen saturations greater than 88%, or any concerns regarding your respiratory status please follow-up with our office.

## 2018-04-11 ENCOUNTER — Encounter (HOSPITAL_COMMUNITY)
Admission: RE | Admit: 2018-04-11 | Discharge: 2018-04-11 | Disposition: A | Discharge status: Home / Self Care | Payer: PRIVATE HEALTH INSURANCE | Source: Ambulatory Visit | Attending: Emergency Medicine | Admitting: Emergency Medicine

## 2018-04-11 DIAGNOSIS — F329 Major depressive disorder, single episode, unspecified: Secondary | ICD-10-CM | POA: Diagnosis not present

## 2018-04-11 DIAGNOSIS — F4323 Adjustment disorder with mixed anxiety and depressed mood: Secondary | ICD-10-CM | POA: Diagnosis not present

## 2018-04-12 ENCOUNTER — Other Ambulatory Visit: Payer: Self-pay

## 2018-04-12 DIAGNOSIS — Z09 Encounter for follow-up examination after completed treatment for conditions other than malignant neoplasm: Secondary | ICD-10-CM | POA: Diagnosis not present

## 2018-04-12 DIAGNOSIS — E559 Vitamin D deficiency, unspecified: Secondary | ICD-10-CM | POA: Diagnosis not present

## 2018-04-12 DIAGNOSIS — J449 Chronic obstructive pulmonary disease, unspecified: Secondary | ICD-10-CM | POA: Diagnosis not present

## 2018-04-12 DIAGNOSIS — I10 Essential (primary) hypertension: Secondary | ICD-10-CM | POA: Diagnosis not present

## 2018-04-12 NOTE — Patient Outreach (Signed)
[facility]) Care Management  04/12/2018  Kinjal Neitzke Spratt 1946/06/16 [id number]   2nd telephone call placed to the patient for initial assessment. No answer.  HIPAA compliant voicemail left with contact information.  Plan: RN Health Coach will make another outreach attempt to the patient within the next four business days.   Lazaro Arms RN, BSN, [place] Direct Dial:  [phone]  Fax: [phone]

## 2018-04-13 ENCOUNTER — Encounter (HOSPITAL_COMMUNITY)
Admission: RE | Admit: 2018-04-13 | Discharge: 2018-04-13 | Disposition: A | Discharge status: Home / Self Care | Payer: Self-pay | Source: Ambulatory Visit | Attending: Emergency Medicine | Admitting: Emergency Medicine

## 2018-04-17 DIAGNOSIS — I10 Essential (primary) hypertension: Secondary | ICD-10-CM | POA: Diagnosis not present

## 2018-04-17 DIAGNOSIS — Z79899 Other long term (current) drug therapy: Secondary | ICD-10-CM | POA: Diagnosis not present

## 2018-04-17 DIAGNOSIS — Z5181 Encounter for therapeutic drug level monitoring: Secondary | ICD-10-CM | POA: Diagnosis not present

## 2018-04-17 DIAGNOSIS — E559 Vitamin D deficiency, unspecified: Secondary | ICD-10-CM | POA: Diagnosis not present

## 2018-04-17 DIAGNOSIS — F419 Anxiety disorder, unspecified: Secondary | ICD-10-CM | POA: Diagnosis not present

## 2018-04-18 ENCOUNTER — Ambulatory Visit: Payer: Self-pay | Admitting: Pharmacist

## 2018-04-18 ENCOUNTER — Other Ambulatory Visit: Payer: Self-pay | Admitting: Pharmacist

## 2018-04-18 ENCOUNTER — Other Ambulatory Visit: Payer: Self-pay

## 2018-04-18 ENCOUNTER — Encounter (HOSPITAL_COMMUNITY)
Admission: RE | Admit: 2018-04-18 | Discharge: 2018-04-18 | Disposition: A | Discharge status: Home / Self Care | Payer: Self-pay | Source: Ambulatory Visit | Attending: Emergency Medicine | Admitting: Emergency Medicine

## 2018-04-18 DIAGNOSIS — F329 Major depressive disorder, single episode, unspecified: Secondary | ICD-10-CM | POA: Diagnosis not present

## 2018-04-18 DIAGNOSIS — J432 Centrilobular emphysema: Secondary | ICD-10-CM | POA: Insufficient documentation

## 2018-04-18 DIAGNOSIS — F4323 Adjustment disorder with mixed anxiety and depressed mood: Secondary | ICD-10-CM | POA: Diagnosis not present

## 2018-04-18 NOTE — Patient Outreach (Signed)
[facility]) Care Management  04/18/2018  Kayla Mack 11-02-1945 [id number]   Unsuccessful 2nd call attempt to [name] regarding nebulizer machine.  I left a HIPAA compliant voicemail on her home / mobile phone.    Plan: I will try to reach patient again later this week.   Ralene Bathe, PharmD, [place] [phone]

## 2018-04-18 NOTE — Patient Outreach (Signed)
[facility]) Care Management  04/18/2018  Julieanne Hadsall Stepp 09-30-1946 [id number]   3rd outreach attempt to the patient for initial assessment. No answer. HIPAA compliant voicemail left with contact information.  Plan: If no response to calls and letter in ten business days. Rn health Coach will proceed with case closure.    Lazaro Arms RN, BSN, [place] Direct Dial:  [phone]  Fax: [phone]

## 2018-04-21 ENCOUNTER — Ambulatory Visit: Payer: Self-pay | Admitting: Pharmacist

## 2018-04-21 ENCOUNTER — Other Ambulatory Visit: Payer: Self-pay | Admitting: Pharmacist

## 2018-04-21 NOTE — Patient Outreach (Signed)
[facility]) Care Management  04/21/2018  Jovana Rembold Macken Jun 17, 1946 [id number]   Successful call attempt #3 to Ms. Butkiewicz to follow-up on inhaler and nebulizer therapy.   1. Patient reports that she was taught how to use her nebulizer machine by the nurses at PT class and has been using it once daily.  She reports she is feeling much better with her breathing.  We reviewed that the Trelegy inhaler is for once daily use and the albuterol (Ventolin or nebulizer) is PRN SOB or wheezing.  Patient voiced understanding.  She has no further questions on administration or dosing.    2. Patient reports that Dr. Agustina Caroli office is assisting her with PAP for Trelegy.  I reminded her that she needs to have spent at least $600 TROOP since January 2019 to qualify.  Patient states that she thinks she has spent this now and will even pay for another prescription of Trelegy in order to ensure she reaches this amount.  I reviewed with patient to request print out from her pharmacy to include with application.  Patient voiced understanding.  She is planning on dropping off application material to Dr. Baldwin Crown office next week.  She has my phone number if she has any further medication related questions or concerns.   Plan: [place] will close case at this time.  I am happy to assist in the future as needed.   Ralene Bathe, PharmD, [place] [phone]

## 2018-04-24 DIAGNOSIS — M899 Disorder of bone, unspecified: Secondary | ICD-10-CM | POA: Diagnosis not present

## 2018-04-24 DIAGNOSIS — F17210 Nicotine dependence, cigarettes, uncomplicated: Secondary | ICD-10-CM | POA: Diagnosis not present

## 2018-04-24 DIAGNOSIS — J449 Chronic obstructive pulmonary disease, unspecified: Secondary | ICD-10-CM | POA: Diagnosis not present

## 2018-04-24 DIAGNOSIS — I10 Essential (primary) hypertension: Secondary | ICD-10-CM | POA: Diagnosis not present

## 2018-04-24 DIAGNOSIS — F329 Major depressive disorder, single episode, unspecified: Secondary | ICD-10-CM | POA: Diagnosis not present

## 2018-04-24 DIAGNOSIS — E559 Vitamin D deficiency, unspecified: Secondary | ICD-10-CM | POA: Diagnosis not present

## 2018-04-24 DIAGNOSIS — Z79899 Other long term (current) drug therapy: Secondary | ICD-10-CM | POA: Diagnosis not present

## 2018-04-24 DIAGNOSIS — F419 Anxiety disorder, unspecified: Secondary | ICD-10-CM | POA: Diagnosis not present

## 2018-04-24 DIAGNOSIS — R413 Other amnesia: Secondary | ICD-10-CM | POA: Diagnosis not present

## 2018-04-24 DIAGNOSIS — E7800 Pure hypercholesterolemia, unspecified: Secondary | ICD-10-CM | POA: Diagnosis not present

## 2018-04-24 DIAGNOSIS — R0609 Other forms of dyspnea: Secondary | ICD-10-CM | POA: Diagnosis not present

## 2018-04-25 ENCOUNTER — Other Ambulatory Visit: Payer: Self-pay

## 2018-04-25 ENCOUNTER — Encounter (HOSPITAL_COMMUNITY)
Admission: RE | Admit: 2018-04-25 | Discharge: 2018-04-25 | Disposition: A | Discharge status: Home / Self Care | Payer: Self-pay | Source: Ambulatory Visit | Attending: Emergency Medicine | Admitting: Emergency Medicine

## 2018-04-25 DIAGNOSIS — F329 Major depressive disorder, single episode, unspecified: Secondary | ICD-10-CM | POA: Diagnosis not present

## 2018-04-25 DIAGNOSIS — F4323 Adjustment disorder with mixed anxiety and depressed mood: Secondary | ICD-10-CM | POA: Diagnosis not present

## 2018-04-25 NOTE — Patient Outreach (Signed)
[facility]) Care Management  [place]  04/25/2018   Kayla Mack May 02, 1946 [id number]  Subjective: Telephone call received from the patient for assessment.  HIPAA verified.  The patient states that she is doing well.  She has pulmonary rehab on Tuesdays, and Thursdays. She was on her way to her appointment this morning. The patient denies any shortness of breath,  wheezing, pain or falls.  She had an appointment yesterday with her physician and states that everything was fine.  She states that her blood work came back good. She is waiting to be set up for her yearly mammogram. I encouraged the patient to continue to take all of medications as prescribed, stay hydrated and cool in this heat and continue with her pulmonary rehab.  Encounter Medications:  Outpatient Encounter Medications as of 04/25/2018  Medication Sig Note  . acetaminophen (TYLENOL) 500 MG tablet Take 1,000 mg by mouth 2 (two) times daily as needed for pain.    [name] albuterol (PROVENTIL) (2.5 MG/3ML) 0.083% nebulizer solution Take 3 mLs (2.5 mg total) by nebulization every 4 (four) hours as needed for wheezing or shortness of breath.   . bisoprolol (ZEBETA) 5 MG tablet Take 1 tablet (5 mg total) by mouth daily.   . calcium-vitamin D (OSCAL WITH D) 500-200 MG-UNIT per tablet Take 1 tablet by mouth 2 (two) times daily.   . clonazePAM (KLONOPIN) 0.5 MG tablet Take 1 tablet (0.5 mg total) by mouth 2 (two) times daily as needed for anxiety. (Patient taking differently: Take 0.5 mg by mouth daily as needed for anxiety. )   . clopidogrel (PLAVIX) 75 MG tablet Take 1 tablet (75 mg total) by mouth daily.   . diphenhydrAMINE (BENADRYL) 25 mg capsule Take 25 mg by mouth as needed for itching.   . Fluticasone-Umeclidin-Vilant (TRELEGY ELLIPTA) 100-62.5-25 MCG/INH AEPB Inhale 1 puff into the lungs daily.   . Fluticasone-Umeclidin-Vilant (TRELEGY ELLIPTA) 100-62.5-25 MCG/INH AEPB Inhale 1 puff into the lungs daily.    [name] lovastatin (MEVACOR) 40 MG tablet Take 40 mg by mouth every morning.   [name] PARoxetine (PAXIL) 20 MG tablet Take 20 mg by mouth daily.   . sodium chloride (OCEAN) 0.65 % SOLN nasal spray Place 1 spray into both nostrils as needed for congestion.   . VENTOLIN HFA 108 (90 Base) MCG/ACT inhaler    . predniSONE (DELTASONE) 20 MG tablet Take 3 tabs x 2 days, then take 2 tabs x 2 days, then take 1 tab x 2 days. (Patient not taking: Reported on 04/25/2018)   . Vitamin D, Ergocalciferol, (DRISDOL) 50000 units CAPS capsule Take 50,000 Units by mouth every 7 (seven) days. 05/04/2017: Sundays   No facility-administered encounter medications on file as of 04/25/2018.     Functional Status:  In your present state of health, do you have any difficulty performing the following activities: 04/02/2018 05/19/2017  Hearing? N N  Vision? N N  Difficulty concentrating or making decisions? N N  Walking or climbing stairs? N N  Dressing or bathing? N N  Doing errands, shopping? N Y  Conservation officer, nature and eating ? - N  Using the Toilet? - N  In the past six months, have you accidently leaked urine? - N  Do you have problems with loss of bowel control? - N  Managing your Medications? - N  Managing your Finances? - N  Housekeeping or managing your Housekeeping? - Y  Some recent data might be hidden    Fall/Depression Screening:  Fall Risk  04/25/2018 03/15/2018 02/03/2018  Falls in the past year? No Yes No  Comment - - -  Injury with Fall? - No -  Risk for fall due to : - Other (Comment) -  Risk for fall due to: Comment - slipped on wet floor -   PHQ 2/9 Scores 10/25/2017 08/29/2017 07/27/2017 07/04/2017 07/01/2017 05/19/2017 05/16/2017  PHQ - 2 Score _0 PHQ- 9 Score - 3 5 - _1 Assessment: Patient will continue to benefit from health coach outreach for disease management and support.  THN CM Care Plan Problem One     Most Recent Value  THN Long Term Goal   In 90 days the patient will verbalize that  she has not been hospitalized  THN Long Term Goal Start Date  04/25/18  Interventions for Problem One Long Term Goal  discussed with the patient medication adherenc diet and exercise  THN CM Short Term Goal #1   In 30 days the patient will verbalize that she has called and spoken with her physician about her health concerns.  THN CM Short Term Goal #1 Start Date  04/25/18  [facility] CM Short Term Goal #1 Met Date  04/25/18  Interventions for Short Term Goal #1  Patient is taking Trelegy     Plan: [place] will contact patient in the month of August and patient agrees to next outreach.  Lazaro Arms RN, BSN, [place] Direct Dial:  [phone]  Fax: [phone]

## 2018-04-27 ENCOUNTER — Encounter (HOSPITAL_COMMUNITY)
Admission: RE | Admit: 2018-04-27 | Discharge: 2018-04-27 | Disposition: A | Discharge status: Home / Self Care | Payer: Self-pay | Source: Ambulatory Visit | Attending: Emergency Medicine | Admitting: Emergency Medicine

## 2018-05-02 ENCOUNTER — Encounter (HOSPITAL_COMMUNITY)
Admission: RE | Admit: 2018-05-02 | Discharge: 2018-05-02 | Disposition: A | Discharge status: Home / Self Care | Payer: Self-pay | Source: Ambulatory Visit | Attending: Emergency Medicine | Admitting: Emergency Medicine

## 2018-05-02 DIAGNOSIS — F329 Major depressive disorder, single episode, unspecified: Secondary | ICD-10-CM | POA: Diagnosis not present

## 2018-05-02 DIAGNOSIS — F4323 Adjustment disorder with mixed anxiety and depressed mood: Secondary | ICD-10-CM | POA: Diagnosis not present

## 2018-05-04 ENCOUNTER — Encounter (HOSPITAL_COMMUNITY): Payer: Self-pay

## 2018-05-09 ENCOUNTER — Encounter (HOSPITAL_COMMUNITY)
Admission: RE | Admit: 2018-05-09 | Discharge: 2018-05-09 | Disposition: A | Discharge status: Home / Self Care | Payer: Self-pay | Source: Ambulatory Visit | Attending: Emergency Medicine | Admitting: Emergency Medicine

## 2018-05-09 DIAGNOSIS — F4323 Adjustment disorder with mixed anxiety and depressed mood: Secondary | ICD-10-CM | POA: Diagnosis not present

## 2018-05-09 DIAGNOSIS — F329 Major depressive disorder, single episode, unspecified: Secondary | ICD-10-CM | POA: Diagnosis not present

## 2018-05-11 ENCOUNTER — Other Ambulatory Visit: Payer: Self-pay | Admitting: Pulmonary Disease

## 2018-05-11 ENCOUNTER — Encounter (HOSPITAL_COMMUNITY)
Admission: RE | Admit: 2018-05-11 | Discharge: 2018-05-11 | Disposition: A | Discharge status: Home / Self Care | Payer: Self-pay | Source: Ambulatory Visit | Attending: Emergency Medicine | Admitting: Emergency Medicine

## 2018-05-11 ENCOUNTER — Encounter: Payer: Self-pay | Admitting: Emergency Medicine

## 2018-05-11 ENCOUNTER — Ambulatory Visit (INDEPENDENT_AMBULATORY_CARE_PROVIDER_SITE_OTHER): Payer: Medicare Other | Admitting: Emergency Medicine

## 2018-05-11 DIAGNOSIS — J449 Chronic obstructive pulmonary disease, unspecified: Secondary | ICD-10-CM

## 2018-05-11 DIAGNOSIS — J9611 Chronic respiratory failure with hypoxia: Secondary | ICD-10-CM | POA: Diagnosis not present

## 2018-05-11 MED ORDER — FLUTICASONE-UMECLIDIN-VILANT 100-62.5-25 MCG/INH IN AEPB: 1.0000 | INHALATION_SPRAY | Freq: Every day | RESPIRATORY_TRACT | 0 refills | Status: DC

## 2018-05-11 MED ORDER — FLUTICASONE-UMECLIDIN-VILANT 100-62.5-25 MCG/INH IN AEPB: 1.0000 | INHALATION_SPRAY | Freq: Every day | RESPIRATORY_TRACT | 5 refills | Status: DC

## 2018-05-11 NOTE — Addendum Note (Signed)
Addended by: Madolyn Frieze on: 05/11/2018 05:25 PM   Modules accepted: Orders

## 2018-05-11 NOTE — Patient Instructions (Signed)
Please continue Trelegy 1 inhalation once daily.  Rinse and gargle after using this medication.  We will work on trying to get financial assistance for this. Keep your albuterol available to use 2 puffs of your inhaler or 1 nebulizer treatment up to every 4 hours as needed for shortness of breath, chest tightness, wheezing. Please continue your oxygen at 4 L/min while resting, 8 to 10 L/min with exercise. Congratulations on participating in the pulmonary rehab maintenance program.  Keep up the good work. Follow with Dr Lamonte Sakai in 3 months or sooner if you have any problems.

## 2018-05-11 NOTE — Assessment & Plan Note (Signed)
Please continue your oxygen at 4 L/min while resting, 8 to 10 L/min with exercise.

## 2018-05-11 NOTE — Assessment & Plan Note (Signed)
Please continue Trelegy 1 inhalation once daily.  Rinse and gargle after using this medication.  We will work on trying to get financial assistance for this. Keep your albuterol available to use 2 puffs of your inhaler or 1 nebulizer treatment up to every 4 hours as needed for shortness of breath, chest tightness, wheezing. Congratulations on participating in the pulmonary rehab maintenance program.  Keep up the good work. Follow with Dr Lamonte Sakai in 3 months or sooner if you have any problems.

## 2018-05-11 NOTE — Progress Notes (Signed)
Subjective:    Patient ID: Kayla Mack, female    DOB: 04-20-1946, 72 y.o.   MRN: [id number]  COPD  Her past medical history is significant for COPD.   ROV 01/25/18 --patient follows up today for COPD and chronic hypoxic and hypercapnic respiratory failure.  She has severe obstruction by pulmonary function testing.  At her last visit we had planned to do a trial of Trelegy to see if she preferred - we didn't have any samples. Currently on Anoro. Uses albuterol very rarely. Denies any cough or wheeze. No flares since last time.   She has completed pulm rehab, noted that she needed 10L/min when she was working the hardest with them. She switched her o2 to [place], she now has a constant flow 15L/min regulator that she can use on one of her portable tanks to allow exercise on 10L/min. At other times she is on 4L/min.   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  She also has secondary PAH.  She was off her maintenance bronchodilators for a few months due to cost.  We tried to get these restarted in April.  She was unfortunately admitted to the hospital in early June with an acute exacerbation and combined respiratory failure.  She was finally able to get Trelegy with financial assistance program.  She is using it now.  She is on chronic oxygen 4 L/min at rest, 10 L/min with exercise.  She uses albuterol approximately. She is back at pulmonary rehab in the maintenance program. She believes that the trelegy is probably helping her, has allowed her to clear secretions more effectively. Probably better exercise tolerance. She is using albuterol nebs about every other day. She has some anxiety and depression.   Spirometry 06/06/17 > severe obstruction Pulmonary function testing performed today 10/4 were reviewed by me. These showSevere obstruction with an FEV1 of 0.92 L (48% predicted), no  bronchodilator response, hyperinflated volumes, severely decreased diffusion capacity. R heart cath 05/09/17: PAP 57/22 (38), PAOP 12   Review of Systems As above      Objective:   Physical Exam Vitals:   05/11/18 1514  BP: 108/68  Pulse: 78  SpO2: 95%  Weight: 130 lb (59 kg)  Height: _0  (1.575 m)   Gen: Pleasant, thin ill appearing, in no distress,  normal affect  ENT: No lesions,  mouth clear,  oropharynx clear, no postnasal drip  Neck: No JVD, no stridor  Lungs: No use of accessory muscles, very distant, no wheezes   Cardiovascular: RRR, heart sounds normal, no murmur or gallops, no peripheral edema  Musculoskeletal: No deformities, no cyanosis or clubbing  Neuro: alert, non focal  Skin: Warm, no lesions or rashes       Assessment & Plan:  COPD (chronic obstructive pulmonary disease) (HCC) Please continue Trelegy 1 inhalation once daily.  Rinse and gargle after using this medication.  We will work on trying to get financial assistance for this. Keep your albuterol available to use 2 puffs of your inhaler or 1 nebulizer treatment up to every 4 hours as needed for shortness of breath, chest tightness, wheezing. Congratulations on participating in the pulmonary rehab maintenance program.  Keep up the good work. Follow with Dr Lamonte Sakai in 3 months or sooner if you have any problems.  Chronic respiratory failure with hypoxia (HCC) Please continue your oxygen at 4 L/min while resting, 8 to 10 L/min with exercise.  Baltazar Apo, MD, PhD 05/11/2018, 3:30 PM [facility] [phone] or if no answer [phone]

## 2018-05-15 ENCOUNTER — Other Ambulatory Visit: Payer: Self-pay

## 2018-05-15 NOTE — Patient Outreach (Signed)
[facility]) Care Management  05/15/2018  Che Below Varano [birth year] [id number]    Received call from the patient.  HIPAA verified.  The patient states that she had a very odd weekend.  On Saturday she felt " very strange"  She felt like she was going to faint.  She started checking her blood pressure and it ranged from 133/70 to 197/110. She states that she had no changes in her daily routine but she did walk some with a friend. Sunday she states she took it easy and continued to monitor her blood pressure and it ranged from 112/57 to 189/92 before bedtime.  She reports that she did not feel like she was going to faint yesterday or today.   I advised the patient to call her physician office  and notify them of the problem, to continue to monitor her blood pressure and also check her monitor to make sure it is reading correct.  The patient verbalized understanding and will call her physician office today.   Plan:  RN Health Coach will call the patient at the next scheduled interval.  Lazaro Arms RN, BSN, [place] Direct Dial:  [phone]  Fax: [phone]

## 2018-05-16 ENCOUNTER — Encounter (HOSPITAL_COMMUNITY)
Admission: RE | Admit: 2018-05-16 | Discharge: 2018-05-16 | Disposition: A | Discharge status: Home / Self Care | Payer: Self-pay | Source: Ambulatory Visit | Attending: Emergency Medicine | Admitting: Emergency Medicine

## 2018-05-16 DIAGNOSIS — F4323 Adjustment disorder with mixed anxiety and depressed mood: Secondary | ICD-10-CM | POA: Diagnosis not present

## 2018-05-16 DIAGNOSIS — F329 Major depressive disorder, single episode, unspecified: Secondary | ICD-10-CM | POA: Diagnosis not present

## 2018-05-16 NOTE — Addendum Note (Signed)
Addended by: Waldon Reining on: 05/16/2018 09:02 AM   Modules accepted: Level of Service, SmartSet

## 2018-05-16 NOTE — Telephone Encounter (Signed)
  This encounter was created in error - please disregard.

## 2018-05-18 ENCOUNTER — Encounter (HOSPITAL_COMMUNITY)
Admission: RE | Admit: 2018-05-18 | Discharge: 2018-05-18 | Disposition: A | Discharge status: Home / Self Care | Payer: Self-pay | Source: Ambulatory Visit | Attending: Emergency Medicine | Admitting: Emergency Medicine

## 2018-05-18 DIAGNOSIS — E785 Hyperlipidemia, unspecified: Secondary | ICD-10-CM | POA: Diagnosis not present

## 2018-05-18 DIAGNOSIS — I10 Essential (primary) hypertension: Secondary | ICD-10-CM | POA: Diagnosis not present

## 2018-05-18 DIAGNOSIS — J449 Chronic obstructive pulmonary disease, unspecified: Secondary | ICD-10-CM | POA: Diagnosis not present

## 2018-05-18 DIAGNOSIS — Z0000 Encounter for general adult medical examination without abnormal findings: Secondary | ICD-10-CM | POA: Diagnosis not present

## 2018-05-18 DIAGNOSIS — E7800 Pure hypercholesterolemia, unspecified: Secondary | ICD-10-CM | POA: Diagnosis not present

## 2018-05-18 DIAGNOSIS — R42 Dizziness and giddiness: Secondary | ICD-10-CM | POA: Diagnosis not present

## 2018-05-18 DIAGNOSIS — J432 Centrilobular emphysema: Secondary | ICD-10-CM | POA: Insufficient documentation

## 2018-05-18 DIAGNOSIS — Z780 Asymptomatic menopausal state: Secondary | ICD-10-CM | POA: Diagnosis not present

## 2018-05-18 DIAGNOSIS — E559 Vitamin D deficiency, unspecified: Secondary | ICD-10-CM | POA: Diagnosis not present

## 2018-05-18 DIAGNOSIS — Z79899 Other long term (current) drug therapy: Secondary | ICD-10-CM | POA: Diagnosis not present

## 2018-05-23 ENCOUNTER — Encounter (HOSPITAL_COMMUNITY)
Admission: RE | Admit: 2018-05-23 | Discharge: 2018-05-23 | Disposition: A | Discharge status: Home / Self Care | Payer: PRIVATE HEALTH INSURANCE | Source: Ambulatory Visit | Attending: Emergency Medicine | Admitting: Emergency Medicine

## 2018-05-25 ENCOUNTER — Encounter (HOSPITAL_COMMUNITY): Payer: Self-pay

## 2018-05-25 ENCOUNTER — Other Ambulatory Visit: Payer: Self-pay | Admitting: Family Medicine

## 2018-05-25 DIAGNOSIS — Z780 Asymptomatic menopausal state: Secondary | ICD-10-CM | POA: Diagnosis not present

## 2018-05-25 DIAGNOSIS — I10 Essential (primary) hypertension: Secondary | ICD-10-CM | POA: Diagnosis not present

## 2018-05-25 DIAGNOSIS — R42 Dizziness and giddiness: Secondary | ICD-10-CM | POA: Diagnosis not present

## 2018-05-25 DIAGNOSIS — E7800 Pure hypercholesterolemia, unspecified: Secondary | ICD-10-CM | POA: Diagnosis not present

## 2018-05-26 ENCOUNTER — Other Ambulatory Visit: Payer: Self-pay

## 2018-05-26 NOTE — Patient Outreach (Signed)
[facility]) Care Management  05/26/2018   Kayla Mack [birth year] [id number]  Subjective: Telephone call to the patient for assessment. HIPAA verified. The patient states that she is doing fine.  She denies any pain or fall.  She reports that she went to see her physician concerning her blood pressure.  She had to monitor her blood pressure but has not had another episode since.  She states that she feels better. She states that she is adherent with her medications.  She reports that she is receiving help from Dr Agustina Caroli office for help Trelegy.  She continues to go to her physical therapy.  She has an appointment with Dr Lamonte Sakai on Monday 8/12.  I congratulated the patient on her efforts and advised her to be careful in the heat.   Current Medications:  Current Outpatient Medications  Medication Sig Dispense Refill  . acetaminophen (TYLENOL) 500 MG tablet Take 1,000 mg by mouth 2 (two) times daily as needed for pain.     [name] albuterol (PROVENTIL) (2.5 MG/3ML) 0.083% nebulizer solution Take 3 mLs (2.5 mg total) by nebulization every 4 (four) hours as needed for wheezing or shortness of breath. 75 mL 3  . bisoprolol (ZEBETA) 5 MG tablet Take 1 tablet (5 mg total) by mouth daily. 30 tablet 0  . calcium-vitamin D (OSCAL WITH D) 500-200 MG-UNIT per tablet Take 1 tablet by mouth 2 (two) times daily.    . clopidogrel (PLAVIX) 75 MG tablet Take 1 tablet (75 mg total) by mouth daily. 30 tablet 0  . diphenhydrAMINE (BENADRYL) 25 mg capsule Take 25 mg by mouth as needed for itching.    . Fluticasone-Umeclidin-Vilant (TRELEGY ELLIPTA) 100-62.5-25 MCG/INH AEPB Inhale 1 puff into the lungs daily. 2 each 0  . lovastatin (MEVACOR) 40 MG tablet Take 40 mg by mouth every morning.    [name] PARoxetine (PAXIL) 20 MG tablet Take 20 mg by mouth daily.    . sodium chloride (OCEAN) 0.65 % SOLN nasal spray Place 1 spray into both nostrils as needed for congestion.    . VENTOLIN HFA 108 (90 Base) MCG/ACT  inhaler     . clonazePAM (KLONOPIN) 0.5 MG tablet Take 1 tablet (0.5 mg total) by mouth 2 (two) times daily as needed for anxiety. (Patient taking differently: Take 0.5 mg by mouth daily as needed for anxiety. ) 10 tablet 0  . Fluticasone-Umeclidin-Vilant (TRELEGY ELLIPTA) 100-62.5-25 MCG/INH AEPB Inhale 1 puff into the lungs daily. 1 each 0  . Fluticasone-Umeclidin-Vilant (TRELEGY ELLIPTA) 100-62.5-25 MCG/INH AEPB Inhale 1 puff into the lungs daily. 1 each 5  . Fluticasone-Umeclidin-Vilant (TRELEGY ELLIPTA) 100-62.5-25 MCG/INH AEPB Inhale 1 puff into the lungs daily. 3 each 0  . predniSONE (DELTASONE) 20 MG tablet Take 3 tabs x 2 days, then take 2 tabs x 2 days, then take 1 tab x 2 days. (Patient not taking: Reported on 05/26/2018) 12 tablet 0  . Vitamin D, Ergocalciferol, (DRISDOL) 50000 units CAPS capsule Take 50,000 Units by mouth every 7 (seven) days.     No current facility-administered medications for this visit.     Functional Status:  In your present state of health, do you have any difficulty performing the following activities: 04/02/2018  Hearing? N  Vision? N  Difficulty concentrating or making decisions? N  Walking or climbing stairs? N  Dressing or bathing? N  Doing errands, shopping? N  Some recent data might be hidden    Fall/Depression Screening: Fall Risk  05/26/2018 04/25/2018 03/15/2018  Falls  in the past year? No No Yes  Comment - - -  Injury with Fall? - - No  Risk for fall due to : - - Other (Comment)  Risk for fall due to: Comment - - slipped on wet floor   PHQ 2/9 Scores 10/25/2017 08/29/2017 07/27/2017 07/04/2017 07/01/2017 05/19/2017 05/16/2017  PHQ - 2 Score _0 PHQ- 9 Score - 3 5 - _1 Assessment: Patient will continue to benefit from health coach outreach for disease management and support.  THN CM Care Plan Problem One     Most Recent Value  THN Long Term Goal   In 60 days the patient will verbalize that she has not been hospitalized  THN  Long Term Goal Start Date  05/26/18  Interventions for Problem One Long Term Goal  discusse medication adherenc, and sypmtoms of copd     Plan:  RN Health Coach will contact patient in the month of October and patient agrees to next outreach.  Lazaro Arms RN, BSN, [place] Direct Dial:  [phone]  Fax: [phone]

## 2018-05-29 ENCOUNTER — Ambulatory Visit (INDEPENDENT_AMBULATORY_CARE_PROVIDER_SITE_OTHER): Payer: Medicare Other | Admitting: Emergency Medicine

## 2018-05-29 ENCOUNTER — Encounter: Payer: Self-pay | Admitting: Emergency Medicine

## 2018-05-29 DIAGNOSIS — J9611 Chronic respiratory failure with hypoxia: Secondary | ICD-10-CM | POA: Diagnosis not present

## 2018-05-29 DIAGNOSIS — J449 Chronic obstructive pulmonary disease, unspecified: Secondary | ICD-10-CM

## 2018-05-29 NOTE — Patient Instructions (Addendum)
Please continue your Trelegy as you have been taking it. Keep your albuterol available to use 2 puffs or 1 nebulizer treatment up to every 4 hours if needed for shortness of breath, chest tightness, wheezing. Continue your oxygen at 4 L/min at rest, 8 to 10 L/min when you are exercising. Congratulations on your participation in the pulmonary rehab maintenance program.  Keep up the good work. Get the flu shot in the fall. Your pneumonia shots are up-to-date. Follow with Dr Lamonte Sakai in 3 months or sooner if you have any problems.

## 2018-05-29 NOTE — Assessment & Plan Note (Signed)
Continue your oxygen at 4 L/min at rest, 8 to 10 L/min when you are exercising.

## 2018-05-29 NOTE — Assessment & Plan Note (Signed)
Please continue your Trelegy as you have been taking it. Keep your albuterol available to use 2 puffs or 1 nebulizer treatment up to every 4 hours if needed for shortness of breath, chest tightness, wheezing. Congratulations on your participation in the pulmonary rehab maintenance program.  Keep up the good work. Get the flu shot in the fall. Your pneumonia shots are up-to-date. Follow with Dr Lamonte Sakai in 3 months or sooner if you have any problems.

## 2018-05-29 NOTE — Progress Notes (Signed)
Subjective:    Patient ID: Kayla Mack, female    DOB: 13-Nov-1945, 72 y.o.   MRN: [id number]  COPD  Her past medical history is significant for COPD.   ROV 01/25/18 --patient follows up today for COPD and chronic hypoxic and hypercapnic respiratory failure.  She has severe obstruction by pulmonary function testing.  At her last visit we had planned to do a trial of Trelegy to see if she preferred - we didn't have any samples. Currently on Anoro. Uses albuterol very rarely. Denies any cough or wheeze. No flares since last time.   She has completed pulm rehab, noted that she needed 10L/min when she was working the hardest with them. She switched her o2 to [place], she now has a constant flow 15L/min regulator that she can use on one of her portable tanks to allow exercise on 10L/min. At other times she is on 4L/min.   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  She also has secondary PAH.  She was off her maintenance bronchodilators for a few months due to cost.  We tried to get these restarted in April.  She was unfortunately admitted to the hospital in early June with an acute exacerbation and combined respiratory failure.  She was finally able to get Trelegy with financial assistance program.  She is using it now.  She is on chronic oxygen 4 L/min at rest, 10 L/min with exercise.  She uses albuterol approximately. She is back at pulmonary rehab in the maintenance program. She believes that the trelegy is probably helping her, has allowed her to clear secretions more effectively. Probably better exercise tolerance. She is using albuterol nebs about every other day. She has some anxiety and depression.   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  She is currently managed on trelegy.   She is using albuterol very rarely.  She uses oxygen at 4 L/min at rest, 8 to 10 L/min when she is exercising.  She has been participating in the pulmonary rehab maintenance program. No flares since last time. No pred, abx.   Spirometry 06/06/17 > severe obstruction Pulmonary function testing performed today 10/4 were reviewed by me. These showSevere obstruction with an FEV1 of 0.92 L (48% predicted), no bronchodilator response, hyperinflated volumes, severely decreased diffusion capacity. R heart cath 05/09/17: PAP 57/22 (38), PAOP 12   Review of Systems As above      Objective:   Physical Exam Vitals:   05/29/18 1610  BP: 124/70  Pulse: 65  SpO2: 98%  Weight: 136 lb (61.7 kg)  Height: _0  (1.575 m)   Gen: Pleasant, thin ill appearing, in no distress,  normal affect  ENT: No lesions,  mouth clear,  oropharynx clear, no postnasal drip  Neck: No JVD, no stridor  Lungs: No use of accessory muscles, very distant, no wheezes   Cardiovascular: RRR, heart sounds normal, no murmur or gallops, no peripheral edema  Musculoskeletal: No deformities, no cyanosis or clubbing  Neuro: alert, non focal  Skin: Warm, no lesions or rashes       Assessment & Plan:  COPD (chronic obstructive pulmonary disease) (HCC) Please continue your Trelegy as you have been taking it. Keep your albuterol available to use 2 puffs or 1 nebulizer treatment up to every 4 hours if needed for shortness of  breath, chest tightness, wheezing. Congratulations on your participation in the pulmonary rehab maintenance program.  Keep up the good work. Get the flu shot in the fall. Your pneumonia shots are up-to-date. Follow with Dr Lamonte Sakai in 3 months or sooner if you have any problems.  Chronic respiratory failure with hypoxia (HCC) Continue your oxygen at 4 L/min at rest, 8 to 10 L/min when you are exercising.  Baltazar Apo, MD, PhD 05/29/2018, 4:39 PM [facility] [phone] or if no  answer [phone]

## 2018-05-30 ENCOUNTER — Encounter (HOSPITAL_COMMUNITY)
Admission: RE | Admit: 2018-05-30 | Discharge: 2018-05-30 | Disposition: A | Discharge status: Home / Self Care | Payer: Self-pay | Source: Ambulatory Visit | Attending: Emergency Medicine | Admitting: Emergency Medicine

## 2018-06-01 ENCOUNTER — Encounter (HOSPITAL_COMMUNITY): Payer: Self-pay

## 2018-06-04 ENCOUNTER — Other Ambulatory Visit: Payer: Self-pay | Admitting: Family Medicine

## 2018-06-06 ENCOUNTER — Encounter (HOSPITAL_COMMUNITY)
Admission: RE | Admit: 2018-06-06 | Discharge: 2018-06-06 | Disposition: A | Discharge status: Home / Self Care | Payer: Self-pay | Source: Ambulatory Visit | Attending: Emergency Medicine | Admitting: Emergency Medicine

## 2018-06-06 DIAGNOSIS — F329 Major depressive disorder, single episode, unspecified: Secondary | ICD-10-CM | POA: Diagnosis not present

## 2018-06-06 DIAGNOSIS — F4323 Adjustment disorder with mixed anxiety and depressed mood: Secondary | ICD-10-CM | POA: Diagnosis not present

## 2018-06-07 DIAGNOSIS — Z961 Presence of intraocular lens: Secondary | ICD-10-CM | POA: Diagnosis not present

## 2018-06-08 ENCOUNTER — Encounter (HOSPITAL_COMMUNITY): Payer: Self-pay

## 2018-06-13 ENCOUNTER — Encounter (HOSPITAL_COMMUNITY): Payer: Self-pay

## 2018-06-13 DIAGNOSIS — F4323 Adjustment disorder with mixed anxiety and depressed mood: Secondary | ICD-10-CM | POA: Diagnosis not present

## 2018-06-13 DIAGNOSIS — F329 Major depressive disorder, single episode, unspecified: Secondary | ICD-10-CM | POA: Diagnosis not present

## 2018-06-15 ENCOUNTER — Encounter (HOSPITAL_COMMUNITY)
Admission: RE | Admit: 2018-06-15 | Discharge: 2018-06-15 | Disposition: A | Discharge status: Home / Self Care | Payer: PRIVATE HEALTH INSURANCE | Source: Ambulatory Visit | Attending: Emergency Medicine | Admitting: Emergency Medicine

## 2018-06-20 ENCOUNTER — Telehealth (HOSPITAL_COMMUNITY): Payer: Self-pay | Admitting: Internal Medicine

## 2018-06-20 ENCOUNTER — Encounter (HOSPITAL_COMMUNITY): Payer: Self-pay

## 2018-06-20 DIAGNOSIS — F4323 Adjustment disorder with mixed anxiety and depressed mood: Secondary | ICD-10-CM | POA: Diagnosis not present

## 2018-06-20 DIAGNOSIS — J432 Centrilobular emphysema: Secondary | ICD-10-CM | POA: Insufficient documentation

## 2018-06-20 DIAGNOSIS — F329 Major depressive disorder, single episode, unspecified: Secondary | ICD-10-CM | POA: Diagnosis not present

## 2018-06-22 ENCOUNTER — Encounter (HOSPITAL_COMMUNITY): Payer: Self-pay

## 2018-06-27 ENCOUNTER — Encounter (HOSPITAL_COMMUNITY): Payer: Self-pay

## 2018-06-27 DIAGNOSIS — F329 Major depressive disorder, single episode, unspecified: Secondary | ICD-10-CM | POA: Diagnosis not present

## 2018-06-27 DIAGNOSIS — F4323 Adjustment disorder with mixed anxiety and depressed mood: Secondary | ICD-10-CM | POA: Diagnosis not present

## 2018-06-28 DIAGNOSIS — Z23 Encounter for immunization: Secondary | ICD-10-CM | POA: Diagnosis not present

## 2018-06-29 ENCOUNTER — Encounter (HOSPITAL_COMMUNITY)
Admission: RE | Admit: 2018-06-29 | Discharge: 2018-06-29 | Disposition: A | Discharge status: Home / Self Care | Payer: Self-pay | Source: Ambulatory Visit | Attending: Emergency Medicine | Admitting: Emergency Medicine

## 2018-06-30 ENCOUNTER — Telehealth (HOSPITAL_COMMUNITY): Payer: Self-pay | Admitting: *Deleted

## 2018-06-30 NOTE — Telephone Encounter (Signed)
Called to check on pt.  Pt was in much better spirits.  Pt took a nap and didn't realize how tired she was.  Pt has plans to go to dinner with 3 of her church friends.  Pt has a plan to check in with her best friend daily. Advised pt that her counselor was out of town in [place] and does not have coverage during her absence.  Pt felt she was in a good head space.  Will follow up with pt on Monday via telephone and will see pt on Tuesday for exercise. Cherre Huger, BSN Cardiac and Training and development officer

## 2018-06-30 NOTE — Progress Notes (Signed)
On Friday, 06/30/18 at 9am.  Pt arrived ambulatory to pulmonary rehab for exercise.  Pulmonary rehab staff advised pt that this is the wrong day.  Pt was at pulmonary rehab for exercise on Thursday (note pulmonary rehab maintenance meets on Tuesdays and Thursdays) Pt seemed confused and unaware that this was not Friday. Pt stated that she was getting things mixed up lately.  Pt mentioned the cable guy and days of the week. Neurological assessment reveals no deficits other than time.  Pt with facial symmetry,equal hand grips, no arm drifts, PERRLA, pt able to identify place and person.  In talking with pt. Pt feels very overwhelmed and stressed outsince her mom died two weeks ago.  Pt was very tearful in talking about her mom passing away.  Pt expresses grief over not being able to help her mother toward the end because of her health.  Talked with pt extensively about grief cycle and allowing herself to grieve.,  Pt was unable to go to her mothers home to pack things up because this is too emotional for her.  Pt saw her counselor this past Tuesday.  Pt has been under the care of a counselor for a long period of time. Called pt counselor seeking advice on how best to proceed. Left message for pt counselor.  Pt felt able to drive herself home and thanked me for taking the time to listen to her and comfort her.  Pt escorted by wheelchair to the [facility]	[place].  Pt called and let rehab staff know she had arrived home safely.  Await return call from pt counselor. Cherre Huger, BSN Cardiac and Training and development officer

## 2018-07-04 ENCOUNTER — Encounter (HOSPITAL_COMMUNITY)
Admission: RE | Admit: 2018-07-04 | Discharge: 2018-07-04 | Disposition: A | Discharge status: Home / Self Care | Payer: Self-pay | Source: Ambulatory Visit | Attending: Emergency Medicine | Admitting: Emergency Medicine

## 2018-07-04 NOTE — Progress Notes (Signed)
On Monday 9/17 in the afternoon: Called and checked on pt for overall well being. Pt had a great weekend went to dinner with friends.  Pt was was in the office sorting her mothers affairs and this did not cause her any distress.  Pt indicated her friends were calling and checking on her and this made her feel good.  Pt looking forward to coming to exercise on Tuesday.  Will follow up then. Cherre Huger, BSN Cardiac and Training and development officer

## 2018-07-06 ENCOUNTER — Encounter (HOSPITAL_COMMUNITY)
Admission: RE | Admit: 2018-07-06 | Discharge: 2018-07-06 | Disposition: A | Discharge status: Home / Self Care | Payer: PRIVATE HEALTH INSURANCE | Source: Ambulatory Visit | Attending: Emergency Medicine | Admitting: Emergency Medicine

## 2018-07-11 ENCOUNTER — Encounter (HOSPITAL_COMMUNITY)
Admission: RE | Admit: 2018-07-11 | Discharge: 2018-07-11 | Disposition: A | Discharge status: Home / Self Care | Payer: Self-pay | Source: Ambulatory Visit | Attending: Emergency Medicine | Admitting: Emergency Medicine

## 2018-07-11 DIAGNOSIS — F329 Major depressive disorder, single episode, unspecified: Secondary | ICD-10-CM | POA: Diagnosis not present

## 2018-07-11 DIAGNOSIS — F4323 Adjustment disorder with mixed anxiety and depressed mood: Secondary | ICD-10-CM | POA: Diagnosis not present

## 2018-07-13 ENCOUNTER — Encounter (HOSPITAL_COMMUNITY): Payer: Self-pay

## 2018-07-18 ENCOUNTER — Encounter (HOSPITAL_COMMUNITY)
Admission: RE | Admit: 2018-07-18 | Discharge: 2018-07-18 | Disposition: A | Discharge status: Home / Self Care | Payer: Self-pay | Source: Ambulatory Visit | Attending: Emergency Medicine | Admitting: Emergency Medicine

## 2018-07-18 DIAGNOSIS — J432 Centrilobular emphysema: Secondary | ICD-10-CM | POA: Insufficient documentation

## 2018-07-18 DIAGNOSIS — F4323 Adjustment disorder with mixed anxiety and depressed mood: Secondary | ICD-10-CM | POA: Diagnosis not present

## 2018-07-18 DIAGNOSIS — F329 Major depressive disorder, single episode, unspecified: Secondary | ICD-10-CM | POA: Diagnosis not present

## 2018-07-20 ENCOUNTER — Encounter (HOSPITAL_COMMUNITY)
Admission: RE | Admit: 2018-07-20 | Discharge: 2018-07-20 | Disposition: A | Discharge status: Home / Self Care | Payer: Self-pay | Source: Ambulatory Visit | Attending: Emergency Medicine | Admitting: Emergency Medicine

## 2018-07-25 ENCOUNTER — Encounter (HOSPITAL_COMMUNITY)
Admission: RE | Admit: 2018-07-25 | Discharge: 2018-07-25 | Disposition: A | Discharge status: Home / Self Care | Payer: Self-pay | Source: Ambulatory Visit | Attending: Emergency Medicine | Admitting: Emergency Medicine

## 2018-07-25 DIAGNOSIS — F4323 Adjustment disorder with mixed anxiety and depressed mood: Secondary | ICD-10-CM | POA: Diagnosis not present

## 2018-07-25 DIAGNOSIS — F329 Major depressive disorder, single episode, unspecified: Secondary | ICD-10-CM | POA: Diagnosis not present

## 2018-07-26 ENCOUNTER — Encounter (HOSPITAL_COMMUNITY): Payer: Self-pay | Admitting: *Deleted

## 2018-07-26 ENCOUNTER — Emergency Department (HOSPITAL_COMMUNITY): Payer: Medicare Other

## 2018-07-26 ENCOUNTER — Inpatient Hospital Stay (HOSPITAL_COMMUNITY)
Admission: EM | Admit: 2018-07-26 | Discharge: 2018-07-30 | DRG: 190 | Disposition: A | Discharge status: Home Health | Payer: Medicare Other | Attending: Internal Medicine | Admitting: Internal Medicine

## 2018-07-26 ENCOUNTER — Other Ambulatory Visit: Payer: Self-pay

## 2018-07-26 DIAGNOSIS — Z7401 Bed confinement status: Secondary | ICD-10-CM | POA: Diagnosis not present

## 2018-07-26 DIAGNOSIS — F419 Anxiety disorder, unspecified: Secondary | ICD-10-CM | POA: Diagnosis present

## 2018-07-26 DIAGNOSIS — Z87891 Personal history of nicotine dependence: Secondary | ICD-10-CM | POA: Diagnosis not present

## 2018-07-26 DIAGNOSIS — Z7902 Long term (current) use of antithrombotics/antiplatelets: Secondary | ICD-10-CM | POA: Diagnosis not present

## 2018-07-26 DIAGNOSIS — J441 Chronic obstructive pulmonary disease with (acute) exacerbation: Secondary | ICD-10-CM | POA: Diagnosis not present

## 2018-07-26 DIAGNOSIS — Z886 Allergy status to analgesic agent status: Secondary | ICD-10-CM | POA: Diagnosis not present

## 2018-07-26 DIAGNOSIS — R000 Tachycardia, unspecified: Secondary | ICD-10-CM | POA: Diagnosis present

## 2018-07-26 DIAGNOSIS — Z8249 Family history of ischemic heart disease and other diseases of the circulatory system: Secondary | ICD-10-CM

## 2018-07-26 DIAGNOSIS — J80 Acute respiratory distress syndrome: Secondary | ICD-10-CM | POA: Diagnosis not present

## 2018-07-26 DIAGNOSIS — Z79899 Other long term (current) drug therapy: Secondary | ICD-10-CM | POA: Diagnosis not present

## 2018-07-26 DIAGNOSIS — Z888 Allergy status to other drugs, medicaments and biological substances status: Secondary | ICD-10-CM

## 2018-07-26 DIAGNOSIS — R0689 Other abnormalities of breathing: Secondary | ICD-10-CM | POA: Diagnosis not present

## 2018-07-26 DIAGNOSIS — I10 Essential (primary) hypertension: Secondary | ICD-10-CM | POA: Diagnosis present

## 2018-07-26 DIAGNOSIS — E785 Hyperlipidemia, unspecified: Secondary | ICD-10-CM | POA: Diagnosis present

## 2018-07-26 DIAGNOSIS — R404 Transient alteration of awareness: Secondary | ICD-10-CM | POA: Diagnosis not present

## 2018-07-26 DIAGNOSIS — J9621 Acute and chronic respiratory failure with hypoxia: Secondary | ICD-10-CM | POA: Diagnosis not present

## 2018-07-26 DIAGNOSIS — J439 Emphysema, unspecified: Secondary | ICD-10-CM | POA: Diagnosis not present

## 2018-07-26 DIAGNOSIS — M2550 Pain in unspecified joint: Secondary | ICD-10-CM | POA: Diagnosis not present

## 2018-07-26 DIAGNOSIS — G9341 Metabolic encephalopathy: Secondary | ICD-10-CM | POA: Diagnosis present

## 2018-07-26 DIAGNOSIS — I2729 Other secondary pulmonary hypertension: Secondary | ICD-10-CM | POA: Diagnosis present

## 2018-07-26 DIAGNOSIS — Z880 Allergy status to penicillin: Secondary | ICD-10-CM | POA: Diagnosis not present

## 2018-07-26 DIAGNOSIS — J9622 Acute and chronic respiratory failure with hypercapnia: Secondary | ICD-10-CM | POA: Diagnosis present

## 2018-07-26 DIAGNOSIS — I2510 Atherosclerotic heart disease of native coronary artery without angina pectoris: Secondary | ICD-10-CM | POA: Diagnosis present

## 2018-07-26 DIAGNOSIS — J9602 Acute respiratory failure with hypercapnia: Secondary | ICD-10-CM | POA: Diagnosis not present

## 2018-07-26 DIAGNOSIS — I272 Pulmonary hypertension, unspecified: Secondary | ICD-10-CM | POA: Diagnosis not present

## 2018-07-26 DIAGNOSIS — I959 Hypotension, unspecified: Secondary | ICD-10-CM | POA: Diagnosis present

## 2018-07-26 DIAGNOSIS — J9690 Respiratory failure, unspecified, unspecified whether with hypoxia or hypercapnia: Secondary | ICD-10-CM | POA: Diagnosis present

## 2018-07-26 DIAGNOSIS — I340 Nonrheumatic mitral (valve) insufficiency: Secondary | ICD-10-CM | POA: Diagnosis not present

## 2018-07-26 DIAGNOSIS — J9620 Acute and chronic respiratory failure, unspecified whether with hypoxia or hypercapnia: Secondary | ICD-10-CM | POA: Diagnosis not present

## 2018-07-26 DIAGNOSIS — R0902 Hypoxemia: Secondary | ICD-10-CM | POA: Diagnosis not present

## 2018-07-26 LAB — I-STAT TROPONIN, ED: Troponin i, poc: 0.01 ng/mL (ref 0.00–0.08)

## 2018-07-26 LAB — I-STAT CG4 LACTIC ACID, ED
Lactic Acid, Venous: 7.29 mmol/L (ref 0.5–1.9)
Lactic Acid, Venous: 2.14 mmol/L (ref 0.5–1.9)

## 2018-07-26 LAB — I-STAT VENOUS BLOOD GAS, ED
Acid-base deficit: 12 mmol/L — ABNORMAL HIGH (ref 0.0–2.0)
Bicarbonate: 21.4 mmol/L (ref 20.0–28.0)
O2 Saturation: 99 %
TCO2: 24 mmol/L (ref 22–32)
pCO2, Ven: 86.1 mmHg (ref 44.0–60.0)
pH, Ven: 7.004 — CL (ref 7.250–7.430)
pO2, Ven: 194 mmHg — ABNORMAL HIGH (ref 32.0–45.0)

## 2018-07-26 LAB — CBG MONITORING, ED: Glucose-Capillary: 264 mg/dL — ABNORMAL HIGH (ref 70–99)

## 2018-07-26 LAB — CBC
HCT: 45 % (ref 36.0–46.0)
Hemoglobin: 13.1 g/dL (ref 12.0–15.0)
MCH: 29.4 pg (ref 26.0–34.0)
MCHC: 29.1 g/dL — ABNORMAL LOW (ref 30.0–36.0)
MCV: 101.1 fL — ABNORMAL HIGH (ref 80.0–100.0)
Platelets: 343 10*3/uL (ref 150–400)
RBC: 4.45 MIL/uL (ref 3.87–5.11)
RDW: 12.5 % (ref 11.5–15.5)
WBC: 15 10*3/uL — ABNORMAL HIGH (ref 4.0–10.5)
nRBC: 0 % (ref 0.0–0.2)

## 2018-07-26 LAB — COMPREHENSIVE METABOLIC PANEL
ALT: 20 U/L (ref 0–44)
AST: 52 U/L — ABNORMAL HIGH (ref 15–41)
Albumin: 3.9 g/dL (ref 3.5–5.0)
Alkaline Phosphatase: 84 U/L (ref 38–126)
Anion gap: 13 (ref 5–15)
BUN: 8 mg/dL (ref 8–23)
CO2: 20 mmol/L — ABNORMAL LOW (ref 22–32)
Calcium: 8.8 mg/dL — ABNORMAL LOW (ref 8.9–10.3)
Chloride: 104 mmol/L (ref 98–111)
Creatinine, Ser: 1.08 mg/dL — ABNORMAL HIGH (ref 0.44–1.00)
GFR calc Af Amer: 58 mL/min — ABNORMAL LOW (ref >60)
GFR calc non Af Amer: 50 mL/min — ABNORMAL LOW (ref >60)
Glucose, Bld: 303 mg/dL — ABNORMAL HIGH (ref 70–99)
Potassium: 3.8 mmol/L (ref 3.5–5.1)
Sodium: 137 mmol/L (ref 135–145)
Total Bilirubin: 0.9 mg/dL (ref 0.3–1.2)
Total Protein: 6.5 g/dL (ref 6.5–8.1)

## 2018-07-26 LAB — I-STAT CHEM 8, ED
BUN: 8 mg/dL (ref 8–23)
Calcium, Ion: 1.15 mmol/L (ref 1.15–1.40)
Chloride: 104 mmol/L (ref 98–111)
Creatinine, Ser: 0.9 mg/dL (ref 0.44–1.00)
Glucose, Bld: 306 mg/dL — ABNORMAL HIGH (ref 70–99)
HCT: 43 % (ref 36.0–46.0)
Hemoglobin: 14.6 g/dL (ref 12.0–15.0)
Potassium: 3.7 mmol/L (ref 3.5–5.1)
Sodium: 137 mmol/L (ref 135–145)
TCO2: 23 mmol/L (ref 22–32)

## 2018-07-26 LAB — GLUCOSE, CAPILLARY: Glucose-Capillary: 169 mg/dL — ABNORMAL HIGH (ref 70–99)

## 2018-07-26 LAB — BRAIN NATRIURETIC PEPTIDE: B Natriuretic Peptide: 338 pg/mL — ABNORMAL HIGH (ref 0.0–100.0)

## 2018-07-26 LAB — PROCALCITONIN: Procalcitonin: 1.34 ng/mL

## 2018-07-26 LAB — TROPONIN I: Troponin I: 0.29 ng/mL (ref <0.03)

## 2018-07-26 LAB — TSH: TSH: 0.783 u[IU]/mL (ref 0.350–4.500)

## 2018-07-26 LAB — D-DIMER, QUANTITATIVE: D-Dimer, Quant: 6.46 ug/mL-FEU — ABNORMAL HIGH (ref 0.00–0.50)

## 2018-07-26 LAB — LACTIC ACID, PLASMA: Lactic Acid, Venous: 5.2 mmol/L (ref 0.5–1.9)

## 2018-07-26 MED ORDER — SODIUM CHLORIDE 0.9 % IV BOLUS: 500.0000 mL | Freq: Once | INTRAVENOUS | Status: DC

## 2018-07-26 MED ORDER — AZITHROMYCIN 250 MG PO TABS
250.0000 mg | ORAL_TABLET | Freq: Every day | ORAL | Status: AC
  Administered 2018-07-27 – 2018-07-30 (×4): 250 mg via ORAL
  Filled 2018-07-26 (×4): qty 1

## 2018-07-26 MED ORDER — ALBUTEROL SULFATE (2.5 MG/3ML) 0.083% IN NEBU: 2.5000 mg | INHALATION_SOLUTION | Freq: Four times a day (QID) | RESPIRATORY_TRACT | Status: DC

## 2018-07-26 MED ORDER — ALBUTEROL SULFATE (2.5 MG/3ML) 0.083% IN NEBU: 2.5000 mg | INHALATION_SOLUTION | RESPIRATORY_TRACT | Status: DC | PRN

## 2018-07-26 MED ORDER — GUAIFENESIN ER 600 MG PO TB12
600.0000 mg | ORAL_TABLET | Freq: Two times a day (BID) | ORAL | Status: DC
  Administered 2018-07-26 – 2018-07-30 (×8): 600 mg via ORAL
  Filled 2018-07-26 (×8): qty 1

## 2018-07-26 MED ORDER — INSULIN ASPART 100 UNIT/ML ~~LOC~~ SOLN: 0.0000 [IU] | Freq: Three times a day (TID) | SUBCUTANEOUS | Status: DC

## 2018-07-26 MED ORDER — SODIUM CHLORIDE 0.9 % IV SOLN
Freq: Once | INTRAVENOUS | Status: AC
  Administered 2018-07-26: 999 mL via INTRAVENOUS

## 2018-07-26 MED ORDER — IPRATROPIUM BROMIDE 0.02 % IN SOLN
0.5000 mg | Freq: Once | RESPIRATORY_TRACT | Status: AC
  Administered 2018-07-26: 0.5 mg via RESPIRATORY_TRACT
  Filled 2018-07-26: qty 2.5

## 2018-07-26 MED ORDER — SALINE SPRAY 0.65 % NA SOLN: 1.0000 | NASAL | Status: DC | PRN

## 2018-07-26 MED ORDER — ACETAMINOPHEN 325 MG PO TABS
650.0000 mg | ORAL_TABLET | Freq: Four times a day (QID) | ORAL | Status: DC | PRN
  Administered 2018-07-26 – 2018-07-29 (×4): 650 mg via ORAL
  Filled 2018-07-26 (×5): qty 2

## 2018-07-26 MED ORDER — INSULIN ASPART 100 UNIT/ML ~~LOC~~ SOLN: 0.0000 [IU] | Freq: Every day | SUBCUTANEOUS | Status: DC

## 2018-07-26 MED ORDER — IPRATROPIUM BROMIDE 0.02 % IN SOLN: 0.5000 mg | Freq: Four times a day (QID) | RESPIRATORY_TRACT | Status: DC

## 2018-07-26 MED ORDER — NITROGLYCERIN 2 % TD OINT
1.0000 [in_us] | TOPICAL_OINTMENT | Freq: Once | TRANSDERMAL | Status: AC
  Administered 2018-07-26: 1 [in_us] via TOPICAL
  Filled 2018-07-26: qty 1

## 2018-07-26 MED ORDER — SODIUM CHLORIDE 0.9 % IV BOLUS
500.0000 mL | Freq: Once | INTRAVENOUS | Status: AC
  Administered 2018-07-27: 500 mL via INTRAVENOUS

## 2018-07-26 MED ORDER — ENOXAPARIN SODIUM 40 MG/0.4ML ~~LOC~~ SOLN
40.0000 mg | SUBCUTANEOUS | Status: DC
  Administered 2018-07-26 – 2018-07-29 (×4): 40 mg via SUBCUTANEOUS
  Filled 2018-07-26 (×4): qty 0.4

## 2018-07-26 MED ORDER — ARFORMOTEROL TARTRATE 15 MCG/2ML IN NEBU
15.0000 ug | INHALATION_SOLUTION | Freq: Two times a day (BID) | RESPIRATORY_TRACT | Status: DC
  Administered 2018-07-26 – 2018-07-30 (×8): 15 ug via RESPIRATORY_TRACT
  Filled 2018-07-26 (×8): qty 2

## 2018-07-26 MED ORDER — IPRATROPIUM-ALBUTEROL 0.5-2.5 (3) MG/3ML IN SOLN
3.0000 mL | Freq: Four times a day (QID) | RESPIRATORY_TRACT | Status: DC
  Administered 2018-07-26 – 2018-07-29 (×12): 3 mL via RESPIRATORY_TRACT
  Filled 2018-07-26 (×13): qty 3

## 2018-07-26 MED ORDER — ALBUTEROL SULFATE (2.5 MG/3ML) 0.083% IN NEBU
10.0000 mg | INHALATION_SOLUTION | Freq: Once | RESPIRATORY_TRACT | Status: AC
  Administered 2018-07-26: 10 mg via RESPIRATORY_TRACT
  Filled 2018-07-26: qty 12

## 2018-07-26 MED ORDER — ALBUTEROL SULFATE (2.5 MG/3ML) 0.083% IN NEBU: 2.5000 mg | INHALATION_SOLUTION | Freq: Once | RESPIRATORY_TRACT | Status: DC

## 2018-07-26 MED ORDER — METOPROLOL TARTRATE 5 MG/5ML IV SOLN
2.5000 mg | Freq: Three times a day (TID) | INTRAVENOUS | Status: DC
  Administered 2018-07-26 – 2018-07-27 (×2): 2.5 mg via INTRAVENOUS
  Filled 2018-07-26 (×2): qty 5

## 2018-07-26 MED ORDER — BUDESONIDE 0.5 MG/2ML IN SUSP
0.5000 mg | Freq: Two times a day (BID) | RESPIRATORY_TRACT | Status: DC
  Administered 2018-07-26 – 2018-07-30 (×8): 0.5 mg via RESPIRATORY_TRACT
  Filled 2018-07-26 (×8): qty 2

## 2018-07-26 MED ORDER — METHYLPREDNISOLONE SODIUM SUCC 125 MG IJ SOLR
60.0000 mg | Freq: Four times a day (QID) | INTRAMUSCULAR | Status: DC
  Administered 2018-07-26 – 2018-07-28 (×7): 60 mg via INTRAVENOUS
  Filled 2018-07-26 (×7): qty 2

## 2018-07-26 MED ORDER — AZITHROMYCIN 250 MG PO TABS
500.0000 mg | ORAL_TABLET | Freq: Every day | ORAL | Status: AC
  Administered 2018-07-26: 500 mg via ORAL
  Filled 2018-07-26: qty 2

## 2018-07-26 MED ORDER — LORAZEPAM 2 MG/ML IJ SOLN
0.5000 mg | Freq: Four times a day (QID) | INTRAMUSCULAR | Status: DC | PRN
  Administered 2018-07-27: 0.5 mg via INTRAVENOUS
  Filled 2018-07-26: qty 1

## 2018-07-26 MED ORDER — FLUTICASONE-UMECLIDIN-VILANT 100-62.5-25 MCG/INH IN AEPB: 1.0000 | INHALATION_SPRAY | Freq: Every day | RESPIRATORY_TRACT | Status: DC

## 2018-07-26 NOTE — Patient Outreach (Signed)
[facility]) Care Management  07/26/2018   Kayla Mack [birth year] [id number]  Subjective: Successful call to the patient for assessment. HIPAA verified. The patient states that she is excited because she is going to attend a symposium at [facility] in November concerning COPD.  The patient denies any pain or falls.  The patient reports that her mother just recently passed away.  RN Health Coach offered her my condolences.  She states that she has been noticing that it has become harder to breath with exertion.  She feels that it may have stemmed from her having to clean her mother's home and situating her belongings. There are  a lot of allergens in the home.  I discussed with the patient the signs and symptoms of COPD and if her symptoms persist she need to call her physician.  She verbalized understanding.  The patient states that she is taking her medications as prescribed.  She states that [place] called and told her about a nebulizer medication that would replace one of her powder inhalers.  The patient was unable to give me the name of the medication.  She states that she has been using it for a short time.  She uses the nebulizer twice a day.  She feels that it may be helping some. The patient states that she has received her flu shot in the beginning of September.    Current Medications:  Current Outpatient Medications  Medication Sig Dispense Refill  . acetaminophen (TYLENOL) 500 MG tablet Take 1,000 mg by mouth 2 (two) times daily as needed for pain.     [name] albuterol (PROVENTIL) (2.5 MG/3ML) 0.083% nebulizer solution Take 3 mLs (2.5 mg total) by nebulization every 4 (four) hours as needed for wheezing or shortness of breath. 75 mL 3  . bisoprolol (ZEBETA) 5 MG tablet Take 1 tablet (5 mg total) by mouth daily. 30 tablet 0  . calcium-vitamin D (OSCAL WITH D) 500-200 MG-UNIT per tablet Take 1 tablet by mouth 2 (two) times daily.    . clonazePAM (KLONOPIN) 0.5 MG tablet Take  1 tablet (0.5 mg total) by mouth 2 (two) times daily as needed for anxiety. (Patient taking differently: Take 0.5 mg by mouth daily as needed for anxiety. ) 10 tablet 0  . clopidogrel (PLAVIX) 75 MG tablet Take 1 tablet (75 mg total) by mouth daily. 30 tablet 0  . diphenhydrAMINE (BENADRYL) 25 mg capsule Take 25 mg by mouth as needed for itching.    . Fluticasone-Umeclidin-Vilant (TRELEGY ELLIPTA) 100-62.5-25 MCG/INH AEPB Inhale 1 puff into the lungs daily. 1 each 5  . lovastatin (MEVACOR) 40 MG tablet Take 40 mg by mouth every morning.    [name] PARoxetine (PAXIL) 20 MG tablet Take 20 mg by mouth daily.    . sodium chloride (OCEAN) 0.65 % SOLN nasal spray Place 1 spray into both nostrils as needed for congestion.    . VENTOLIN HFA 108 (90 Base) MCG/ACT inhaler      No current facility-administered medications for this visit.     Functional Status:  In your present state of health, do you have any difficulty performing the following activities: 04/02/2018  Hearing? N  Vision? N  Difficulty concentrating or making decisions? N  Walking or climbing stairs? N  Dressing or bathing? N  Doing errands, shopping? N  Some recent data might be hidden    Fall/Depression Screening: Fall Risk  07/26/2018 05/26/2018 04/25/2018  Falls in the past year? No No No  Comment - - -  Injury with Fall? - - -  Risk for fall due to : - - -  Risk for fall due to: Comment - - -   PHQ 2/9 Scores 10/25/2017 08/29/2017 07/27/2017 07/04/2017 07/01/2017 05/19/2017 05/16/2017  PHQ - 2 Score _0 PHQ- 9 Score - 3 5 - _1 Assessment: Patient will continue to benefit from health coach outreach for disease management and support.  THN CM Care Plan Problem One     Most Recent Value  THN Long Term Goal   In 60 days the patient will verbalize that she has not been hospitalized  THN Long Term Goal Start Date  07/26/18  Interventions for Problem One Long Term Goal  Talked with the patient about signs and syptoms of  copd  and medication adherence.     Plan:  RN Health Coach will contact patient in the month of December and patient agrees to next outreach.   Lazaro Arms RN, BSN, [place] Direct Dial:  [phone]  Fax: [phone]

## 2018-07-26 NOTE — ED Notes (Signed)
Blood Pressure dropped to 88/67 , Dr. Colvin Caroli aware, orders received Nitro paste removed from chest and500cc bolus given. Patient is alert oriented, able to speak remains on Bipap.

## 2018-07-26 NOTE — Progress Notes (Signed)
Pt transported on Bipap from ED to 3P29 without complications.

## 2018-07-26 NOTE — H&P (Signed)
[facility]                                                                                    Patient Demographics  Kayla Mack, is a 72 y.o. female  CSN: [id number]  MRN: [id number]  DOB - 07/23/1946  Admit Date - 07/26/2018  Outpatient Primary MD for the patient is Jani Gravel, MD   With History of -  Past Medical History:  Diagnosis Date  . Anxiety   . COPD (chronic obstructive pulmonary disease) ([place])   . Depression   . Hyperlipidemia   . Hypertension       Past Surgical History:  Procedure Laterality Date  . APPENDECTOMY    . RIGHT/LEFT HEART CATH AND CORONARY ANGIOGRAPHY N/A 05/09/2017   Procedure: Right/Left Heart Cath and Coronary Angiography;  Surgeon: [name], MD;  Location: [place] [facility];  Service: Cardiovascular;  Laterality: N/A;  . SHOULDER SURGERY Right Pin  . TONSILLECTOMY      in for   Respiratory failure  HPI  Kayla Mack  is a 72 y.o. female, with past medical history significant for severe COPD and pulmonary hypertension on oxygen for to 5 L/min baseline who ran out of her oxygen while at church today and was brought to the emergency room obtunded with pulse ox in the 70s.  The patient was started on BiPAP and her mental status improved significantly.  Patient received fluid boluses for hypotension and severe lactacidosis in the emergency room.  Portable chest x-ray showed bullous emphysema with no congestive heart failure and her EKG showed sinus tachycardia with RVH.  I was able to communicate with her with the BiPAP on.    Review of Systems    In addition to the HPI above,  No Fever-chills, No Headache, No changes with Vision or hearing, No problems swallowing food or Liquids, No Abdominal pain, No Nausea or Vommitting, Bowel movements are regular, No Blood in stool or Urine, No dysuria, No new skin rashes or bruises, No new joints pains-aches,  No new weakness, tingling, numbness in any  extremity, No recent weight gain or loss, No polyuria, polydypsia or polyphagia, No significant Mental Stressors.  A full 10 point Review of Systems was done, except as stated above, all other Review of Systems were negative.   Social History Social History   Tobacco Use  . Smoking status: Former Smoker    Packs/day: 1.50    Years: 40.00    Pack years: 60.00    Types: Cigarettes    Last attempt to quit: 03/18/2017    Years since quitting: 1.3  . Smokeless tobacco: Never Used  Substance Use Topics  . Alcohol use: Yes    Comment: liquor      Family History Family History  Problem Relation Age of Onset  . Heart disease Mother   . Heart failure Mother   . Heart disease Father        cabg  . Heart disease Sister   . Diabetes Sister      Prior to Admission medications   Medication Sig Start Date End Date Taking? Authorizing Provider  acetaminophen (TYLENOL) 500 MG tablet  Take 1,000 mg by mouth 2 (two) times daily as needed for pain.     [provider]  albuterol (PROVENTIL) (2.5 MG/3ML) 0.083% nebulizer solution Take 3 mLs (2.5 mg total) by nebulization every 4 (four) hours as needed for wheezing or shortness of breath. 04/04/18   Mikhail, Velta Addison, DO  bisoprolol (ZEBETA) 5 MG tablet Take 1 tablet (5 mg total) by mouth daily. 05/14/17   Caroline More, DO  calcium-vitamin D (OSCAL WITH D) 500-200 MG-UNIT per tablet Take 1 tablet by mouth 2 (two) times daily. 06/06/13   Riebock, Estill Bakes, NP  clonazePAM (KLONOPIN) 0.5 MG tablet Take 1 tablet (0.5 mg total) by mouth 2 (two) times daily as needed for anxiety. Patient taking differently: Take 0.5 mg by mouth daily as needed for anxiety.  06/06/13   Riebock, Estill Bakes, NP  clopidogrel (PLAVIX) 75 MG tablet Take 1 tablet (75 mg total) by mouth daily. 05/14/17   Caroline More, DO  diphenhydrAMINE (BENADRYL) 25 mg capsule Take 25 mg by mouth as needed for itching.    [provider]  Fluticasone-Umeclidin-Vilant (TRELEGY  ELLIPTA) 100-62.5-25 MCG/INH AEPB Inhale 1 puff into the lungs daily. 05/11/18   Lauraine Rinne, NP  lovastatin (MEVACOR) 40 MG tablet Take 40 mg by mouth every morning.    [provider]  PARoxetine (PAXIL) 20 MG tablet Take 20 mg by mouth daily.    [provider]  sodium chloride (OCEAN) 0.65 % SOLN nasal spray Place 1 spray into both nostrils as needed for congestion.    [provider]  VENTOLIN HFA 108 [phone] Base) MCG/ACT inhaler  05/23/17   [provider]    Allergies  Allergen Reactions  . Aleve [Naproxen Sodium] Hives  . Aspirin Swelling    Facial swelling  . Penicillins Hives and Swelling    Has patient had a PCN reaction causing immediate rash, facial/tongue/throat swelling, SOB or lightheadedness with hypotension: Yes Has patient had a PCN reaction causing severe rash involving mucus membranes or skin necrosis: Yes Has patient had a PCN reaction that required hospitalization: Unk Has patient had a PCN reaction occurring within the last 10 years: No If all of the above answers are "NO", then may proceed with Cephalosporin use.     Physical Exam  Vitals  Blood pressure 95/63, pulse 87, resp. rate 19, SpO2 95 %.   1. General extremely pleasant female on BiPAP  2. Normal affect and insight, Not Suicidal or Homicidal, Awake Alert, Oriented X 3.  3. No F.N deficits, grossly, patient moving all extremities.  4. Ears and Eyes appear Normal, Conjunctivae clear, PERRLA. Moist Oral Mucosa.  5. Supple Neck, No JVD, No cervical lymphadenopathy appriciated, No Carotid Bruits.  6. Symmetrical Chest wall movement, decreased breath sounds bilaterally  7. RRR, No Gallops, Rubs or Murmurs, No Parasternal Heave.  8. Positive Bowel Sounds, Abdomen Soft, Non tender, No organomegaly appriciated,No rebound -guarding or rigidity.  9.  No Cyanosis, Normal Skin Turgor, No Skin Rash or Bruise.  10. Good muscle tone,  joints appear normal , no effusions,  Normal ROM.    Data Review  CBC Recent Labs  Lab 07/26/18 1239 07/26/18 1249  WBC 15.0*  --   HGB 13.1 14.6  HCT 45.0 43.0  PLT 343  --   MCV 101.1*  --   MCH 29.4  --   MCHC 29.1*  --   RDW 12.5  --    ------------------------------------------------------------------------------------------------------------------  Chemistries  Recent Labs  Lab 07/26/18 1239  07/26/18 1249  NA 137 137  K 3.8 3.7  CL 104 104  CO2 20*  --   GLUCOSE 303* 306*  BUN 8 8  CREATININE 1.08* 0.90  CALCIUM 8.8*  --   AST 52*  --   ALT 20  --   ALKPHOS 84  --   BILITOT 0.9  --    ------------------------------------------------------------------------------------------------------------------ CrCl cannot be calculated (Unknown ideal weight.). ------------------------------------------------------------------------------------------------------------------ No results for input(s): TSH, T4TOTAL, T3FREE, THYROIDAB in the last 72 hours.  Invalid input(s): FREET3   Coagulation profile No results for input(s): INR, PROTIME in the last 168 hours. ------------------------------------------------------------------------------------------------------------------- No results for input(s): DDIMER in the last 72 hours. -------------------------------------------------------------------------------------------------------------------  Cardiac Enzymes No results for input(s): CKMB, TROPONINI, MYOGLOBIN in the last 168 hours.  Invalid input(s): CK ------------------------------------------------------------------------------------------------------------------ Invalid input(s): POCBNP   ---------------------------------------------------------------------------------------------------------------  Urinalysis    Component Value Date/Time   COLORURINE STRAW (A) 04/03/2018 0554   APPEARANCEUR CLEAR 04/03/2018 0554   LABSPEC 1.006 04/03/2018 0554   PHURINE 6.0 04/03/2018 0554   GLUCOSEU  NEGATIVE 04/03/2018 0554   HGBUR NEGATIVE 04/03/2018 0554   BILIRUBINUR NEGATIVE 04/03/2018 0554   KETONESUR NEGATIVE 04/03/2018 0554   PROTEINUR NEGATIVE 04/03/2018 0554   UROBILINOGEN 0.2 06/02/2013 2253   NITRITE NEGATIVE 04/03/2018 0554   LEUKOCYTESUR NEGATIVE 04/03/2018 0554    ----------------------------------------------------------------------------------------------------------------    Imaging results:   Dg Chest Portable 1 View  Result Date: 07/26/2018 CLINICAL DATA:  Respiratory distress, history of COPD, trauma. EXAM: PORTABLE CHEST 1 VIEW COMPARISON:  Portable chest x-ray of April 02, 2018 FINDINGS: The lungs remain hyperinflated. The interstitial markings remain increased in the mid and lower lungs bilaterally. Bullous lesions in the apices bilaterally are stable. The heart and pulmonary vascularity are normal. There is calcification in the wall of the aortic arch. There is no pleural effusion or pneumothorax. IMPRESSION: Bullous emphysema. Increased interstitial markings in the mid and lower lungs is chronic but superimposed acute bronchitis may be present. No CHF. Thoracic aortic atherosclerosis. Electronically Signed   By: David  Martinique M.D.   On: 07/26/2018 13:03    My personal review of EKG: Sinus tach at 111 bpm with right ventricular hypertrophy    Assessment & Plan  1.  Respiratory failure /usually followed by Dr. Lamonte Sakai      Continue with BiPAP and admit to stepdown     pulmonary consulted, Dr. Pearline Cables  2.  COPD/pulmonary hypertension      Continue Solu-Medrol/neb treatments  3.  History of depression/anxiety      Holding Paxil and Klonopin for now on Ativan as needed due to BiPAP  4.  Hyperlipidemia; Mevacor on hold  5.  Hypertension/tachycardia      Holding Xarelto for now continue with IV Lopressor every 8 hours   DVT Prophylaxis Lovenox  AM Labs Ordered, also please review Full Orders  Code Status full  Disposition Plan: Home  Time spent in  minutes : 46 minutes  Condition critical   _0 @

## 2018-07-26 NOTE — ED Triage Notes (Signed)
Patient was at work at [employer] , she is normally on 2/[employer] and ran out of her o2 , patient was found outside decreased loc patient was also out of her inhaler.  Was found outside  Upon firedept. They gave patient albuterol treatment. . Ems also gave albulerol 63m atrovent.5 mg , solu-medrol 1224m, mag2m55mIv upon arrival patient is alert however not following commands

## 2018-07-26 NOTE — Consult Note (Addendum)
NAME:  Kayla Mack, MRN:  [id number], DOB:  08-19-1946, LOS: 0 ADMISSION DATE:  07/26/2018, CONSULTATION DATE:  07/26/2018 REFERRING MD:  Dr. Laren Everts, CHIEF COMPLAINT:  resp distress  Brief History   33 yoF chronic hypoxic/ hypercapnia on O2 5L baseline up to 10L w/exertion, COPD and Pulm hypertension with increasing O2 needs and dyspnea improved with rest since Sunday.  Ran out of O2 today and likely syncopal episode while trying to get new O2 tank.  Initial respiratory distress requiring nebs, mag, and BiPAP with resolution of AMS and distress.    Past Medical History  COPD, chronic hypoxic/ hypercapnia respiratory failure, home O2 5-10L, pulm HTN, HTN, HLD Significant Hospital Events   10/9 Admit to SDU by [facility]  Consults: date of consult/date signed off & final recs:  PCCM 10/9  Procedures (surgical and bedside):   Significant Diagnostic Tests:   Micro Data:  10/9 RVP >> 10/9 sputum cx >> 10/9 MRSA PCR >>  Antimicrobials:  10/9 azithro >>  Subjective:  Asking for BiPAP off, denies any further dyspnea  Objective   Blood pressure 95/63, pulse 87, resp. rate 19, SpO2 95 %.    FiO2 (%):  [40 %-100 %] 40 %  No intake or output data in the 24 hours ending 07/26/18 1646 There were no vitals filed for this visit.  Examination: General:  Very pleasant elderly female sitting up in ER stretcher in NAD HEENT: fitting full face mask, pupils 3/reactive, +JVD Neuro: Awake, oriented, MAE, non focal  CV: RR, no murmur, +2 pulses PULM: even/non-labored on BiPAP 15/5, TV ~450-650 ml, on 40% sats ~95%, lungs bilaterally clear, good air movement, no wheeze, speaking in full sentences without difficutly GI: soft, non-tender, bs active  Extremities: warm/dry, no BLE edema, no erythema/ warmth/ swelling Skin: no rashes   Resolved Hospital Problem list    Assessment & Plan:  Acute on chronic hypoxic/ hypercarbic respiratory failure related to possible AECOPD vs viral process vs  inflammatory process vs bacterial process  - CXR with increased bilateral lobe interstitial markings P:  BiPAP as needed for increased WOB Supplemental O2 for sats 88-94% to her baseline of 5-6L/ 10L w/exertion Hold home trelegy- brovonna, pulmicort and duonebs ordered, albuterol prn Hold on systemic steroids for now, no wheeze and good air movement Send sputum for cx Add guaifenesin to help thin secretions Starting empiric azithro Assessing PCT, if neg can d/c abx Assessing RVP  Checking d-dimer, low ddx for PE  Pulm HTN - supplemental O2 as above  Disposition / Summary of Today's Plan 07/26/18   Admit to SDU/ [facility] for stabilization, see above     Diet: NPO while on bipap Pain/Anxiety/Delirium protocol (if indicated): n/a VAP protocol (if indicated): n/a DVT prophylaxis: SCDs/ heparin SQ GI prophylaxis: protonix po Hyperglycemia protocol: n/a Mobility: BR for now, PT consult when able Code Status:  Patient states she's not had prior longterm GOCs discussions, given her underlying conditions.  Started discussions of what her wishes would be for longterm support vs temporary, etc.  Patient to remain full code for now. Family Communication: No immediate family locally.  Lives alone.  Close friend Robert Bellow knows her [facility], would be her niece Simmie Davies who lives up [place].   Labs   CBC: Recent Labs  Lab 07/26/18 1239 07/26/18 1249  WBC 15.0*  --   HGB 13.1 14.6  HCT 45.0 43.0  MCV 101.1*  --   PLT 343  --     Basic  Metabolic Panel: Recent Labs  Lab 07/26/18 1239 07/26/18 1249  NA 137 137  K 3.8 3.7  CL 104 104  CO2 20*  --   GLUCOSE 303* 306*  BUN 8 8  CREATININE 1.08* 0.90  CALCIUM 8.8*  --    GFR: CrCl cannot be calculated (Unknown ideal weight.). Recent Labs  Lab 07/26/18 1239 07/26/18 1250 07/26/18 1455  WBC 15.0*  --   --   LATICACIDVEN  --  7.29* 2.14*    Liver Function Tests: Recent Labs  Lab 07/26/18 1239  AST 52*  ALT 20  ALKPHOS 84   BILITOT 0.9  PROT 6.5  ALBUMIN 3.9   No results for input(s): LIPASE, AMYLASE in the last 168 hours. No results for input(s): AMMONIA in the last 168 hours.  ABG    Component Value Date/Time   PHART 7.202 (L) 04/02/2018 0423   PCO2ART 68.3 (HH) 04/02/2018 0423   PO2ART 122.0 (H) 04/02/2018 0423   HCO3 21.4 07/26/2018 1335   TCO2 24 07/26/2018 1335   ACIDBASEDEF 12.0 (H) 07/26/2018 1335   O2SAT 99.0 07/26/2018 1335     Coagulation Profile: No results for input(s): INR, PROTIME in the last 168 hours.  Cardiac Enzymes: No results for input(s): CKTOTAL, CKMB, CKMBINDEX, TROPONINI in the last 168 hours.  HbA1C: Hgb A1c MFr Bld  Date/Time Value Ref Range Status  05/04/2017 10:50 AM 5.8 (H) 4.8 - 5.6 % Final    Comment:    (NOTE)         Pre-diabetes: 5.7 - 6.4         Diabetes: >6.4         Glycemic control for adults with diabetes: <7.0     CBG: Recent Labs  Lab 07/26/18 1235  GLUCAP 264*    Admitting History of Present Illness.   72 year old female with PMH significant for chronic hypoxic/ hypercapnic respiratory failure on baseline O2 4 L up to 10L with exercise, COPD, former smoker, pulmonary hypertension, HTN, HLD, anxiety and depression.    She is followed by Dr. Lamonte Sakai in the office, participates in pulmonary rehab and has been maintained on Trelegy and prn albuterol nebs.  Prior spirometry 05/2017 noted for severe obstruction with FEV1 at 48% and RHC 04/2017 with PAP 57/22 (38). States she is up to date on flu vaccination.  She lives alone and is independent of ADLs.    Patient states she has been doing well at home.  Recently, her mother passed away and she was packing up her house on Saturday and states she had a cat and there was a lot of dander.  On Sunday, she noticed she was more dyspneic, sneezing, and requiring up to 6 L O2 baseline and using her albuterol inhaler frequently.  DOE would improve with rest. She has had sick contacts at church.  Denies fever or  chills and no change in her chronic sputum production of small creamy yellow. Denies sinus congestion or watery/itchy eyes. No travel.  Denies CP, lower leg swelling or pain.    She was at church today and noticed she was running out of O2 so she went to get O2 tank out of car.  Friends noticed she was increasing confused.  She was found outside and doesn't remember events.  EMS found her with sats in the 70's, started her on BiPAP, nebs and mag.  In ER, her mental status improved with BiPAP.  She was initially significantly hypertensive and nitro paste applied but  then became hypotensive requiring fluid bolus and removal of NTG.  Labs noted for LA 7.29 -> 2.14, neg trop, WBC 15, ABG 7.004/ 86.1/ 194/ 21.4, BNP 338, EKG with some twave depressions thought due to demand ischemia.   TRH was called for admission who asked for pulmonary evaluation.    Review of Systems:   POSITIVES IN BOLD  Gen: Denies fever, chills, weight change, fatigue HEENT: Denies vision changes, sinus congestion, sneezing, rhinorrhea, sore throat, neck stiffness, dysphagia PULM: Denies shortness of breath, cough, chronic sputum production, hemoptysis, wheezing CV: Denies chest pain, edema, orthopnea, paroxysmal nocturnal dyspnea, palpitations GI: Denies abdominal pain, nausea, vomiting, change in bowel habits Derm: Denies rash or skin change Heme: Denies easy bruising, bleeding, Neuro: Denies headache, numbness, weakness, slurred speech, loss of memory or consciousness  Past Medical History  She,  has a past medical history of Anxiety, COPD (chronic obstructive pulmonary disease) ([place]), Depression, Hyperlipidemia, and Hypertension.   Surgical History    Past Surgical History:  Procedure Laterality Date  . APPENDECTOMY    . RIGHT/LEFT HEART CATH AND CORONARY ANGIOGRAPHY N/A 05/09/2017   Procedure: Right/Left Heart Cath and Coronary Angiography;  Surgeon: [name], MD;  Location: [place] [facility];  Service:  Cardiovascular;  Laterality: N/A;  . SHOULDER SURGERY Right Pin  . TONSILLECTOMY       Social History   Social History   Socioeconomic History  . Marital status: Single    Spouse name: Not on file  . Number of children: Not on file  . Years of education: Not on file  . Highest education level: Not on file  Occupational History  . Not on file  Social Needs  . Financial resource strain: Not on file  . Food insecurity:    Worry: Not on file    Inability: Not on file  . Transportation needs:    Medical: Not on file    Non-medical: Not on file  Tobacco Use  . Smoking status: Former Smoker    Packs/day: 1.50    Years: 40.00    Pack years: 60.00    Types: Cigarettes    Last attempt to quit: 03/18/2017    Years since quitting: 1.3  . Smokeless tobacco: Never Used  Substance and Sexual Activity  . Alcohol use: Yes    Comment: liquor   . Drug use: No  . Sexual activity: Not Currently  Lifestyle  . Physical activity:    Days per week: Not on file    Minutes per session: Not on file  . Stress: Not on file  Relationships  . Social connections:    Talks on phone: Not on file    Gets together: Not on file    Attends religious service: Not on file    Active member of club or organization: Not on file    Attends meetings of clubs or organizations: Not on file    Relationship status: Not on file  . Intimate partner violence:    Fear of current or ex partner: Not on file    Emotionally abused: Not on file    Physically abused: Not on file    Forced sexual activity: Not on file  Other Topics Concern  . Not on file  Social History Narrative  . Not on file  ,  reports that she quit smoking about 16 months ago. Her smoking use included cigarettes. She has a 60.00 pack-year smoking history. She has never used smokeless tobacco. She reports that she  drinks alcohol. She reports that she does not use drugs.   Family History   Her family history includes Diabetes in her sister;  Heart disease in her father, mother, and sister; Heart failure in her mother.   Allergies Allergies  Allergen Reactions  . Aleve [Naproxen Sodium] Hives  . Aspirin Swelling    Facial swelling  . Penicillins Hives and Swelling    Has patient had a PCN reaction causing immediate rash, facial/tongue/throat swelling, SOB or lightheadedness with hypotension: Yes Has patient had a PCN reaction causing severe rash involving mucus membranes or skin necrosis: Yes Has patient had a PCN reaction that required hospitalization: Unk Has patient had a PCN reaction occurring within the last 10 years: No If all of the above answers are "NO", then may proceed with Cephalosporin use.      Home Medications  Prior to Admission medications   Medication Sig Start Date End Date Taking? Authorizing Provider  acetaminophen (TYLENOL) 500 MG tablet Take 1,000 mg by mouth 2 (two) times daily as needed for pain.     [provider]  albuterol (PROVENTIL) (2.5 MG/3ML) 0.083% nebulizer solution Take 3 mLs (2.5 mg total) by nebulization every 4 (four) hours as needed for wheezing or shortness of breath. 04/04/18   Mikhail, Velta Addison, DO  bisoprolol (ZEBETA) 5 MG tablet Take 1 tablet (5 mg total) by mouth daily. 05/14/17   Caroline More, DO  calcium-vitamin D (OSCAL WITH D) 500-200 MG-UNIT per tablet Take 1 tablet by mouth 2 (two) times daily. 06/06/13   Riebock, Estill Bakes, NP  clonazePAM (KLONOPIN) 0.5 MG tablet Take 1 tablet (0.5 mg total) by mouth 2 (two) times daily as needed for anxiety. Patient taking differently: Take 0.5 mg by mouth daily as needed for anxiety.  06/06/13   Riebock, Estill Bakes, NP  clopidogrel (PLAVIX) 75 MG tablet Take 1 tablet (75 mg total) by mouth daily. 05/14/17   Caroline More, DO  diphenhydrAMINE (BENADRYL) 25 mg capsule Take 25 mg by mouth as needed for itching.    [provider]  Fluticasone-Umeclidin-Vilant (TRELEGY ELLIPTA) 100-62.5-25 MCG/INH AEPB Inhale 1 puff into the lungs  daily. 05/11/18   Lauraine Rinne, NP  lovastatin (MEVACOR) 40 MG tablet Take 40 mg by mouth every morning.    [provider]  PARoxetine (PAXIL) 20 MG tablet Take 20 mg by mouth daily.    [provider]  sodium chloride (OCEAN) 0.65 % SOLN nasal spray Place 1 spray into both nostrils as needed for congestion.    [provider]  VENTOLIN HFA 108 [phone] Base) MCG/ACT inhaler  05/23/17   [provider]     Kennieth Rad, AGACNP-BC [facility] Pgr: [phone] or if no answer [phone] 07/26/2018, 6:18 PM

## 2018-07-26 NOTE — Progress Notes (Signed)
CRITICAL VALUE ALERT  Critical Value:  Troponin 0.29  Date & Time Notied:  07/26/18 2145  Provider Notified: Dyann Kief  Orders Received/Actions taken: No new orders at present.

## 2018-07-26 NOTE — Progress Notes (Signed)
RT was unable to remove patient's dentures prior to placing patient on Bipap due to her teeth clinched tight.  MD notified.

## 2018-07-26 NOTE — ED Provider Notes (Signed)
[facility] EMERGENCY DEPARTMENT Provider Note   CSN: [id number] Arrival date & time: 07/26/18  1222     History   Chief Complaint No chief complaint on file.   HPI Kayla Mack is a 71 y.o. female.  HPI Initial report is from EMS.  Patient arrived initially very confused and somnolent and respiratory distress.  Per EMS patient went to church event with coworkers.  She arrived in normal condition alert and well.  Her oxygen tank ran out.  She was trying to get a second tank but became extremely confused and short of breath.  EMS was called.  They report on arrival patient was poorly responsive in respiratory distress.  Had very poor airflow.  They applied BiPAP and administered Solu-Medrol, magnesium, albuterol 10 mg and Atrovent 0.5 mg.  On arrival, oxygen saturation was in the 70s.  Transport they did reach low 90s.  The patient arrives in significant distress, they report she has improved since initial intake.  Patient was later able to explain that she saw her oxygen running out and tried to go to her vehicle to get another tank but started getting more and more short of breath to the point of hardly be on the breathe at all.  She reports remembering trying to get her tanks but then perhaps losing consciousness because she does not remember what happened after that.  She does reports that this week is felt harder to get a good deep breath in.  She has turned her oxygen up from a baseline of 6 up to 7.  Reports she has pulmonary hypertension.  She denies chest pain.  She denies recent fever chills or productive cough.  No recent extremity swelling.  No history of CHF. Past Medical History:  Diagnosis Date  . Anxiety   . COPD (chronic obstructive pulmonary disease) ([place])   . Depression   . Hyperlipidemia   . Hypertension     Patient Active Problem List   Diagnosis Date Noted  . Depression 04/02/2018  . Anxiety 04/02/2018  . Hypertension 04/02/2018  .  Hyperlipidemia 04/02/2018  . Chronic respiratory failure with hypoxia ([place]) 12/29/2017  . Status post carpal tunnel release 12/08/2017  . Carpal tunnel syndrome, right upper limb 10/17/2017  . Trigger finger, right ring finger 10/17/2017  . Pulmonary hypertension ([place])   . SOB (shortness of breath)   . Hyperkalemia   . Elevated troponin   . COPD (chronic obstructive pulmonary disease) ([place]) 05/04/2017  . Acute respiratory distress   . Tachycardia   . Hypokalemia   . Hypotension   . Tobacco use disorder 06/04/2013  . T12 burst fracture ([place]) 06/03/2013  . Left radius fracture 06/03/2013  . Right calcaneal fracture 06/03/2013  . Fall from ladder 06/03/2013  . Osteopenia of the elderly 06/03/2013    Past Surgical History:  Procedure Laterality Date  . APPENDECTOMY    . RIGHT/LEFT HEART CATH AND CORONARY ANGIOGRAPHY N/A 05/09/2017   Procedure: Right/Left Heart Cath and Coronary Angiography;  Surgeon: [name], MD;  Location: [facility];  Service: Cardiovascular;  Laterality: N/A;  . SHOULDER SURGERY Right Pin  . TONSILLECTOMY       OB History   None      Home Medications    Prior to Admission medications   Medication Sig Start Date End Date Taking? Authorizing Provider  acetaminophen (TYLENOL) 500 MG tablet Take 1,000 mg by mouth 2 (two) times daily as needed for pain.  [provider]  albuterol (PROVENTIL) (2.5 MG/3ML) 0.083% nebulizer solution Take 3 mLs (2.5 mg total) by nebulization every 4 (four) hours as needed for wheezing or shortness of breath. 04/04/18   Mikhail, Velta Addison, DO  bisoprolol (ZEBETA) 5 MG tablet Take 1 tablet (5 mg total) by mouth daily. 05/14/17   Caroline More, DO  calcium-vitamin D (OSCAL WITH D) 500-200 MG-UNIT per tablet Take 1 tablet by mouth 2 (two) times daily. 06/06/13   Riebock, Estill Bakes, NP  clonazePAM (KLONOPIN) 0.5 MG tablet Take 1 tablet (0.5 mg total) by mouth 2 (two) times daily as needed for anxiety. Patient  taking differently: Take 0.5 mg by mouth daily as needed for anxiety.  06/06/13   Riebock, Estill Bakes, NP  clopidogrel (PLAVIX) 75 MG tablet Take 1 tablet (75 mg total) by mouth daily. 05/14/17   Caroline More, DO  diphenhydrAMINE (BENADRYL) 25 mg capsule Take 25 mg by mouth as needed for itching.    [provider]  Fluticasone-Umeclidin-Vilant (TRELEGY ELLIPTA) 100-62.5-25 MCG/INH AEPB Inhale 1 puff into the lungs daily. 05/11/18   Lauraine Rinne, NP  lovastatin (MEVACOR) 40 MG tablet Take 40 mg by mouth every morning.    [provider]  PARoxetine (PAXIL) 20 MG tablet Take 20 mg by mouth daily.    [provider]  sodium chloride (OCEAN) 0.65 % SOLN nasal spray Place 1 spray into both nostrils as needed for congestion.    [provider]  VENTOLIN HFA 108 [phone] Base) MCG/ACT inhaler  05/23/17   [provider]    Family History Family History  Problem Relation Age of Onset  . Heart disease Mother   . Heart failure Mother   . Heart disease Father        cabg  . Heart disease Sister   . Diabetes Sister     Social History Social History   Tobacco Use  . Smoking status: Former Smoker    Packs/day: 1.50    Years: 40.00    Pack years: 60.00    Types: Cigarettes    Last attempt to quit: 03/18/2017    Years since quitting: 1.3  . Smokeless tobacco: Never Used  Substance Use Topics  . Alcohol use: Yes    Comment: liquor   . Drug use: No     Allergies   Aleve [naproxen sodium]; Aspirin; and Penicillins   Review of Systems Review of Systems 10 Systems reviewed and are negative for acute change except as noted in the HPI.   Physical Exam Updated Vital Signs BP 98/70   Pulse (!) 115   Resp 18   SpO2 94%   Physical Exam  Constitutional:  Patient arrives by EMS in significant respiratory distress on BiPAP.  Patient is pale and slightly diaphoretic.  Somnolent and confused.  HENT:  Head: Normocephalic and atraumatic.  Airway patent.  No  secretions.  Eyes:  Pupils are dilated midrange bilaterally.  Neck: Neck supple.  Cardiovascular:  Tachycardia.  No gross rub murmur gallop however significant respiratory noise.  Pulmonary/Chest:  Respiratory distress.  Expiratory wheeze.  Poor general airflow.  No rhonchi no rales.  Abdominal: Soft. She exhibits no distension. There is no tenderness. There is no guarding.  Musculoskeletal: Normal range of motion. She exhibits no edema or tenderness.  Neurological:  Patient arrived somnolent and obtunded.  She did have spontaneous movement of all extremities.  After treatment and improvement, patient has normal neurologic function, normal cognitive function, normal speech.  Skin:  Arrival,  pale and slightly diaphoretic     ED Treatments / Results  Labs (all labs ordered are listed, but only abnormal results are displayed) Labs Reviewed  CBC - Abnormal; Notable for the following components:      Result Value   WBC 15.0 (*)    MCV 101.1 (*)    MCHC 29.1 (*)    All other components within normal limits  COMPREHENSIVE METABOLIC PANEL - Abnormal; Notable for the following components:   CO2 20 (*)    Glucose, Bld 303 (*)    Creatinine, Ser 1.08 (*)    Calcium 8.8 (*)    AST 52 (*)    GFR calc non Af Amer 50 (*)    GFR calc Af Amer 58 (*)    All other components within normal limits  CBG MONITORING, ED - Abnormal; Notable for the following components:   Glucose-Capillary 264 (*)    All other components within normal limits  I-STAT CG4 LACTIC ACID, ED - Abnormal; Notable for the following components:   Lactic Acid, Venous 7.29 (*)    All other components within normal limits  I-STAT CHEM 8, ED - Abnormal; Notable for the following components:   Glucose, Bld 306 (*)    All other components within normal limits  I-STAT VENOUS BLOOD GAS, ED - Abnormal; Notable for the following components:   pH, Ven 7.004 (*)    pCO2, Ven 86.1 (*)    pO2, Ven 194.0 (*)    Acid-base deficit  12.0 (*)    All other components within normal limits  BRAIN NATRIURETIC PEPTIDE  BLOOD GAS, VENOUS  I-STAT TROPONIN, ED    EKG None  Radiology Dg Chest Portable 1 View  Result Date: 07/26/2018 CLINICAL DATA:  Respiratory distress, history of COPD, trauma. EXAM: PORTABLE CHEST 1 VIEW COMPARISON:  Portable chest x-ray of April 02, 2018 FINDINGS: The lungs remain hyperinflated. The interstitial markings remain increased in the mid and lower lungs bilaterally. Bullous lesions in the apices bilaterally are stable. The heart and pulmonary vascularity are normal. There is calcification in the wall of the aortic arch. There is no pleural effusion or pneumothorax. IMPRESSION: Bullous emphysema. Increased interstitial markings in the mid and lower lungs is chronic but superimposed acute bronchitis may be present. No CHF. Thoracic aortic atherosclerosis. Electronically Signed   By: David  Martinique M.D.   On: 07/26/2018 13:03    Procedures Procedures (including critical care time) CRITICAL CARE Performed by: Charlesetta Shanks   Total critical care time: 45 minutes  Critical care time was exclusive of separately billable procedures and treating other patients.  Critical care was necessary to treat or prevent imminent or life-threatening deterioration.  Critical care was time spent personally by me on the following activities: development of treatment plan with patient and/or surrogate as well as nursing, discussions with consultants, evaluation of patient's response to treatment, examination of patient, obtaining history from patient or surrogate, ordering and performing treatments and interventions, ordering and review of laboratory studies, ordering and review of radiographic studies, pulse oximetry and re-evaluation of patient's condition. Medications Ordered in ED Medications  ipratropium (ATROVENT) nebulizer solution 0.5 mg (0.5 mg Nebulization Given 07/26/18 1234)  albuterol (PROVENTIL) (2.5  MG/3ML) 0.083% nebulizer solution 10 mg (10 mg Nebulization Given 07/26/18 1234)  nitroGLYCERIN (NITROGLYN) 2 % ointment 1 inch (1 inch Topical Given 07/26/18 1243)  0.9 %  sodium chloride infusion (999 mLs Intravenous New Bag/Given 07/26/18 1309)     Initial Impression / Assessment and  Plan / ED Course  I have reviewed the triage vital signs and the nursing notes.  Pertinent labs & imaging results that were available during my care of the patient were reviewed by me and considered in my medical decision making (see chart for details).  Clinical Course as of Jul 27 1526  Wed Jul 26, 2018  1335 Patient has improved dramatically.  She is now alert and cheerful.  She is speaking full sentences through the BiPAP.  She reports at baseline she has to keep her oxygen at 6 L due to pulmonary hypertension.  She reports when she does rehab exercises she is up to 10 L.   [MP]  1506 Consult to tried hospitalist ordered.   [MP]    Clinical Course User Index [MP] Charlesetta Shanks, MD   She was maintained on BiPAP.  Initially, tachycardic to the 130s with hypertensive blood pressure diastolic greater than 910.  She had been treated by EMS with Solu-Medrol, magnesium, butyryl 10 mg and Atrovent 0.5 mg.  Repeat albuterol therapy initiated with 10 and 0.5.  Nitroglycerin paste applied for significant hypertension.  This patient's respiratory status began to improve, heart rate improved.  Initial EKG had inferior ST depression.  Comparing this was previous this appeared likely to be pre-existing with exaggeration due to tachycardia.  Repeat  EKG with slower rate, showed improvement and consistent with previous EKG.  Troponin within normal limits.  She denied chest pain.  She may have experienced some demand ischemia but at this time no signs of acute MI.  With improved respiratory status, patient's blood pressure began to drop.  Pressure reached 80 systolic, nitroglycerin paste removed and fluid bolus initiated.   Patient reassessed and reported feeling much improved.  No symptomatic findings from hypotension.  With Fluid administration, blood pressure returning to there is a 90 systolic.  Clinical condition continues to improve.  Final Clinical Impressions(s) / ED Diagnoses   Final diagnoses:  Acute on chronic respiratory failure with hypoxia and hypercapnia ([place])  Pulmonary hypertension [facility])    ED Discharge Orders    None       Charlesetta Shanks, MD 07/27/18 1527

## 2018-07-26 NOTE — Progress Notes (Signed)
CRITICAL VALUE ALERT  Critical Value:  Lactic Acid - 5.2  Date & Time Notied:  07/26/2018 @ 2240  Provider Notified: [place]  Orders Received/Actions taken: No new orders at this time.

## 2018-07-26 NOTE — ED Notes (Signed)
Patient is much more alert , states she remembers that she was running out of her 02 and was going to the car to get another tank.

## 2018-07-26 NOTE — Progress Notes (Addendum)
RT asked to adjust Bipap mask.  RT removed mask and the patient was able to remove her lower denture plate, but unable to remove the top plate due to sealant.  RT placed pt back on bipap and placed lower denture in denture cup with pt label.

## 2018-07-27 ENCOUNTER — Inpatient Hospital Stay (HOSPITAL_COMMUNITY): Payer: Medicare Other

## 2018-07-27 ENCOUNTER — Other Ambulatory Visit: Payer: Self-pay

## 2018-07-27 ENCOUNTER — Encounter (HOSPITAL_COMMUNITY): Payer: Self-pay

## 2018-07-27 DIAGNOSIS — I340 Nonrheumatic mitral (valve) insufficiency: Secondary | ICD-10-CM

## 2018-07-27 DIAGNOSIS — J9622 Acute and chronic respiratory failure with hypercapnia: Secondary | ICD-10-CM

## 2018-07-27 DIAGNOSIS — J9621 Acute and chronic respiratory failure with hypoxia: Secondary | ICD-10-CM

## 2018-07-27 LAB — ECHOCARDIOGRAM COMPLETE

## 2018-07-27 LAB — RESPIRATORY PANEL BY PCR

## 2018-07-27 LAB — GLUCOSE, CAPILLARY
Glucose-Capillary: 136 mg/dL — ABNORMAL HIGH (ref 70–99)
Glucose-Capillary: 150 mg/dL — ABNORMAL HIGH (ref 70–99)
Glucose-Capillary: 134 mg/dL — ABNORMAL HIGH (ref 70–99)
Glucose-Capillary: 157 mg/dL — ABNORMAL HIGH (ref 70–99)

## 2018-07-27 LAB — LACTIC ACID, PLASMA: Lactic Acid, Venous: 2.1 mmol/L (ref 0.5–1.9)

## 2018-07-27 LAB — BASIC METABOLIC PANEL
Anion gap: 12 (ref 5–15)
BUN: 15 mg/dL (ref 8–23)
CO2: 20 mmol/L — ABNORMAL LOW (ref 22–32)
Calcium: 8.1 mg/dL — ABNORMAL LOW (ref 8.9–10.3)
Chloride: 107 mmol/L (ref 98–111)
Creatinine, Ser: 0.83 mg/dL (ref 0.44–1.00)
GFR calc Af Amer: >60 mL/min (ref >60)
GFR calc non Af Amer: >60 mL/min (ref >60)
Glucose, Bld: 161 mg/dL — ABNORMAL HIGH (ref 70–99)
Potassium: 4.3 mmol/L (ref 3.5–5.1)
Sodium: 139 mmol/L (ref 135–145)

## 2018-07-27 LAB — TROPONIN I: Troponin I: 0.22 ng/mL (ref <0.03)

## 2018-07-27 LAB — MRSA PCR SCREENING: MRSA by PCR: NEGATIVE

## 2018-07-27 MED ORDER — PRAVASTATIN SODIUM 40 MG PO TABS
40.0000 mg | ORAL_TABLET | Freq: Every day | ORAL | Status: DC
  Administered 2018-07-27 – 2018-07-29 (×3): 40 mg via ORAL
  Filled 2018-07-27 (×3): qty 1

## 2018-07-27 MED ORDER — PAROXETINE HCL 20 MG PO TABS
20.0000 mg | ORAL_TABLET | Freq: Every day | ORAL | Status: DC
  Administered 2018-07-27 – 2018-07-30 (×4): 20 mg via ORAL
  Filled 2018-07-27 (×4): qty 1

## 2018-07-27 MED ORDER — FLUTICASONE PROPIONATE 50 MCG/ACT NA SUSP
2.0000 | Freq: Every day | NASAL | Status: DC
  Administered 2018-07-28 – 2018-07-30 (×3): 2 via NASAL
  Filled 2018-07-27: qty 16

## 2018-07-27 MED ORDER — BISOPROLOL FUMARATE 5 MG PO TABS
2.5000 mg | ORAL_TABLET | Freq: Every day | ORAL | Status: DC
  Administered 2018-07-27 – 2018-07-30 (×4): 2.5 mg via ORAL
  Filled 2018-07-27 (×4): qty 1

## 2018-07-27 MED ORDER — CLOPIDOGREL BISULFATE 75 MG PO TABS
75.0000 mg | ORAL_TABLET | Freq: Every day | ORAL | Status: DC
  Administered 2018-07-27 – 2018-07-30 (×4): 75 mg via ORAL
  Filled 2018-07-27 (×4): qty 1

## 2018-07-27 MED ORDER — INSULIN ASPART 100 UNIT/ML ~~LOC~~ SOLN
0.0000 [IU] | Freq: Three times a day (TID) | SUBCUTANEOUS | Status: DC
  Administered 2018-07-27 – 2018-07-29 (×7): 1 [IU] via SUBCUTANEOUS

## 2018-07-27 MED ORDER — CLONAZEPAM 0.5 MG PO TABS: 0.2500 mg | ORAL_TABLET | Freq: Two times a day (BID) | ORAL | Status: DC | PRN

## 2018-07-27 MED ORDER — CLONAZEPAM 0.25 MG PO TBDP
0.2500 mg | ORAL_TABLET | Freq: Two times a day (BID) | ORAL | Status: DC | PRN
  Administered 2018-07-27 – 2018-07-29 (×3): 0.25 mg via ORAL
  Filled 2018-07-27 (×3): qty 1

## 2018-07-27 MED ORDER — LORATADINE 10 MG PO TABS
10.0000 mg | ORAL_TABLET | Freq: Every day | ORAL | Status: DC
  Administered 2018-07-27 – 2018-07-30 (×4): 10 mg via ORAL
  Filled 2018-07-27 (×4): qty 1

## 2018-07-27 NOTE — Progress Notes (Signed)
  Echocardiogram 2D Echocardiogram has been performed.  Merrie Roof F 07/27/2018, 11:40 AM

## 2018-07-27 NOTE — Patient Outreach (Signed)
[facility]) Care Management  07/27/2018  Aerica Rincon Sher 1946-03-19 [id number]    [place] notified that the patient was admitted to th hospital on 07/26/2018 for Acute on Chronic Respiratory failure with hypoxia.   Plan: RN Health Coach will close the case at this time and send message to the [facility].  Lazaro Arms RN, BSN, [place] Direct Dial:  [phone]  Fax: [phone]

## 2018-07-27 NOTE — Plan of Care (Signed)
Discussed plan of care for the evening with patient.  Emphasized the importance of using the call bell when assistance is needed.  Good teach back displayed.

## 2018-07-27 NOTE — Progress Notes (Signed)
NAME:  Kayla Mack, MRN:  [id number], DOB:  25-Feb-1946, LOS: 1 ADMISSION DATE:  07/26/2018, CONSULTATION DATE:  07/26/2018 REFERRING MD:  Dr. Laren Everts, CHIEF COMPLAINT:  resp distress  Brief History   40 yoF chronic hypoxic/ hypercapnia on O2 5L baseline up to 10L w/exertion, COPD and Pulm hypertension with increasing O2 needs and dyspnea improved with rest since Sunday.  Ran out of O2 today and likely syncopal episode while trying to get new O2 tank.  Initial respiratory distress requiring nebs, mag, and BiPAP with resolution of AMS and distress.    Past Medical History  COPD, chronic hypoxic/ hypercapnia respiratory failure, home O2 5-10L, pulm HTN, HTN, HLD Significant Hospital Events   10/9 Admit to SDU by [facility]  Consults: date of consult/date signed off & final recs:  PCCM 10/9  Procedures (surgical and bedside):   Significant Diagnostic Tests:  10/10 TTE >> - Left ventricle: The cavity size was normal. Systolic function was normal. The estimated ejection fraction was in the range of 60% to 65%. Wall motion was normal; there were no regional wall  motion abnormalities. Features are consistent with a pseudonormal  left ventricular filling pattern, with concomitant abnormal  relaxation and increased filling pressure (grade 2 diastolic dysfunction).   Aortic valve: Trileaflet; mildly thickened, mildly calcified leaflets. Mitral valve: There was moderate regurgitation directed posteriorly.  Left atrium: The atrium was mildly dilated.  Normal PAP pressure   Micro Data:  10/9 RVP >> neg 10/9 sputum cx >> 10/9 MRSA PCR >> negative  Antimicrobials:  10/9 azithro >>  Subjective:  Off bipap overnight On HFNC 8L [place] Afebrile, mild increase in WBC  Continued productive cough   Objective   Blood pressure (!) 147/87, pulse (!) 118, temperature 98.3 F (36.8 C), temperature source Oral, resp. rate (!) 36, SpO2 95 %.    FiO2 (%):  [40 %] 40 %   Intake/Output Summary (Last 24 hours)  at 07/27/2018 1439 Last data filed at 07/27/2018 0300 Gross per 24 hour  Intake 1500 ml  Output -  Net 1500 ml   There were no vitals filed for this visit.  Examination: General:  Pleasant elderly female sitting up in bed HEENT: MM pink/moist, +JVD Neuro: alert, intact CV: ST 108, rrr PULM: speaking full sentences with mild dyspnea and work of breathing on HFNC on 8L, clear anteriorly, diminished in r base, no wheeze YB:WLSL, non-tender, bs active  Extremities: warm/dry, no BLE edema  Skin: no rashes   Resolved Hospital Problem list    Assessment & Plan:  Acute on chronic hypoxic/ hypercarbic respiratory failure related to possible AECOPD vs inflammatory/ allergic process vs bacterial process  - RVP neg P:  Ongoing BiPAP as needed for increased WOB Supplemental O2 for sats 88-94% to her baseline of 5-6L/ 10L w/exertion continue brovonna, pulmicort and duonebs ordered, albuterol prn Solumedrol per primary team Send sputum for cx- sending today  Continue guaifenesin Adding Flonase and Claritin for probable allergic component- elevated eso on previous diff Continue azithro, PCT 1.34, slight increase in WBC could also be steroid induced.  RVP neg D-dimer positive, however TTE today notes normal RV function and PAP pressures Ativan prn per primary team as needed for anxiety   Pulm HTN- noted on prior RHC - TTE today notes normal RV function and PAP - supplemental O2 as above  Disposition / Summary of Today's Plan 07/27/18   Pulmonary will continue to follow    Diet: advance as tolerated Pain/Anxiety/Delirium protocol (if  indicated): n/a VAP protocol (if indicated): n/a DVT prophylaxis: SCDs/ lovenox Hyperglycemia protocol: ssi Mobility: BR for now, PT consult when able Code Status:  Full code Family Communication: No immediate family locally.  Patient updated on plan of care   Labs   CBC: Recent Labs  Lab 07/26/18 1239 07/26/18 1249  WBC 15.0*  --   HGB 13.1  14.6  HCT 45.0 43.0  MCV 101.1*  --   PLT 343  --     Basic Metabolic Panel: Recent Labs  Lab 07/26/18 1239 07/26/18 1249 07/27/18 0456  NA 137 137 139  K 3.8 3.7 4.3  CL 104 104 107  CO2 20*  --  20*  GLUCOSE 303* 306* 161*  BUN _0 CREATININE 1.08* 0.90 0.83  CALCIUM 8.8*  --  8.1*   GFR: CrCl cannot be calculated (Unknown ideal weight.). Recent Labs  Lab 07/26/18 1239 07/26/18 1250 07/26/18 1455 07/26/18 1923 07/26/18 2157 07/27/18 0758  PROCALCITON  --   --   --  1.34  --   --   WBC 15.0*  --   --   --   --   --   LATICACIDVEN  --  7.29* 2.14*  --  5.2* 2.1*    Liver Function Tests: Recent Labs  Lab 07/26/18 1239  AST 52*  ALT 20  ALKPHOS 84  BILITOT 0.9  PROT 6.5  ALBUMIN 3.9   No results for input(s): LIPASE, AMYLASE in the last 168 hours. No results for input(s): AMMONIA in the last 168 hours.  ABG    Component Value Date/Time   PHART 7.202 (L) 04/02/2018 0423   PCO2ART 68.3 (HH) 04/02/2018 0423   PO2ART 122.0 (H) 04/02/2018 0423   HCO3 21.4 07/26/2018 1335   TCO2 24 07/26/2018 1335   ACIDBASEDEF 12.0 (H) 07/26/2018 1335   O2SAT 99.0 07/26/2018 1335     Coagulation Profile: No results for input(s): INR, PROTIME in the last 168 hours.  Cardiac Enzymes: Recent Labs  Lab 07/26/18 1923 07/27/18 0456  TROPONINI 0.29* 0.22*    HbA1C: Hgb A1c MFr Bld  Date/Time Value Ref Range Status  05/04/2017 10:50 AM 5.8 (H) 4.8 - 5.6 % Final    Comment:    (NOTE)         Pre-diabetes: 5.7 - 6.4         Diabetes: >6.4         Glycemic control for adults with diabetes: <7.0     CBG: Recent Labs  Lab 07/26/18 1235 07/26/18 2143 07/27/18 0721 07/27/18 [place], AGACNP-BC [facility] Pgr: [phone] or if no answer [phone] 07/27/2018, 2:39 PM

## 2018-07-27 NOTE — Progress Notes (Signed)
[facility] Progress Note  Patient: Kayla Mack SWN:[id number]   PCP: Jani Gravel, MD DOB: 1946/09/18   DOA: 07/26/2018   DOS: 07/27/2018   Date of Service: the patient was seen and examined on 07/27/2018  Brief hospital course: Pt. with PMH of severe COPD, HTN, HLD, depression, anxiety; admitted on 07/26/2018, presented with complaint of shortness of breath and confusion, was found to have acute hypoxic respiratory failure on chronic hypoxic. Currently further plan is continue current management.  Subjective: Feeling better this morning.  No confusion noted.  No acute events overnight.  Still has some tightness of breathing.  Assessment and Plan: 1.  Acute metabolic encephalopathy secondary to hypoxia. Acute on chronic respiratory failure. Severe COPD with acute exacerbation At home uses 5 L at rest and 10 L with exertion. And out of her oxygen tank yesterday and was unable to connect. Likely passed out secondary to hypoxia. EMS brought the patient to the hospital.  On BiPAP. This morning on 8 L of oxygen. Doing better able to talk in longer sentences. Continue IV steroids.  Continue nebulizers.  Continue azithromycin. Appreciate PCCM input. Respiratory virus panel is negative. Possibly allergic exposure. Lactic acidosis has resolved.  2.  Essential hypertension. Blood pressure mildly elevated. Resume home bisoprolol but at a lower dose of 2.5 mg daily.  3.  HLD. Continue statin starting tomorrow.  4. Anxiety and depression. Patient is on Paxil at home which I will resume. Currently on IV Ativan 0.5 mg every 6 hours as needed.  We will discontinue that medication for now.  5.  Nonobstructive CAD. Resume Plavix.  Diet: Cardiac diet  DVT Prophylaxis: subcutaneous Heparin  Advance goals of care discussion: Appreciate CCM having goals of care discussion.  Currently DNI but would like to be resuscitated  Family Communication: no family was present at bedside, at  the time of interview.   Disposition:  Discharge to home.  Consultants: PCCM  Procedures: Echocardiogram   Scheduled Meds: . arformoterol  15 mcg Nebulization BID  . azithromycin  250 mg Oral Daily  . bisoprolol  2.5 mg Oral Daily  . budesonide (PULMICORT) nebulizer solution  0.5 mg Nebulization BID  . enoxaparin (LOVENOX) injection  40 mg Subcutaneous Q24H  . fluticasone  2 spray Each Nare Daily  . guaiFENesin  600 mg Oral BID  . insulin aspart  0-5 Units Subcutaneous QHS  . insulin aspart  0-9 Units Subcutaneous TID WC  . ipratropium-albuterol  3 mL Nebulization Q6H  . loratadine  10 mg Oral Daily  . methylPREDNISolone (SOLU-MEDROL) injection  60 mg Intravenous Q6H   Continuous Infusions: PRN Meds: acetaminophen, albuterol, albuterol, LORazepam, sodium chloride Antibiotics: Anti-infectives (From admission, onward)   Start     Dose/Rate Route Frequency Ordered Stop   07/27/18 1000  azithromycin (ZITHROMAX) tablet 250 mg     250 mg Oral Daily 07/26/18 1722 07/31/18 0959   07/26/18 1800  azithromycin (ZITHROMAX) tablet 500 mg     500 mg Oral Daily 07/26/18 1722 07/26/18 1855       Objective: Physical Exam: Vitals:   07/27/18 0400 07/27/18 0717 07/27/18 1234 07/27/18 1351  BP: 114/76  (!) 147/87   Pulse: 94  (!) 118   Resp: 20  (!) 36   Temp:   98.3 F (36.8 C)   TempSrc:   Oral   SpO2: 96% 95% 93% 95%    Intake/Output Summary (Last 24 hours) at 07/27/2018 1713 Last data filed at 07/27/2018 0300 Gross per 24  hour  Intake 1500 ml  Output -  Net 1500 ml   There were no vitals filed for this visit. General: Alert, Awake and Oriented to Time, Place and Person. Appear in moderate distress, affect appropriate Eyes: PERRL, Conjunctiva normal ENT: Oral Mucosa clear moist. Neck: no JVD, no Abnormal Mass Or lumps Cardiovascular: S1 and S2 Present, no Murmur, Peripheral Pulses Present Respiratory: increased respiratory effort, Bilateral Air entry equal and Decreased, no  use of accessory muscle, no Crackles, Occasional  wheezes Abdomen: Bowel Sound present, Soft and no tenderness, no hernia Skin: no redness, no Rash, no induration Extremities: no Pedal edema, no calf tenderness Neurologic: Grossly no focal neuro deficit. Bilaterally Equal motor strength  Data Reviewed: CBC: Recent Labs  Lab 07/26/18 1239 07/26/18 1249  WBC 15.0*  --   HGB 13.1 14.6  HCT 45.0 43.0  MCV 101.1*  --   PLT 343  --    Basic Metabolic Panel: Recent Labs  Lab 07/26/18 1239 07/26/18 1249 07/27/18 0456  NA 137 137 139  K 3.8 3.7 4.3  CL 104 104 107  CO2 20*  --  20*  GLUCOSE 303* 306* 161*  BUN _0 CREATININE 1.08* 0.90 0.83  CALCIUM 8.8*  --  8.1*    Liver Function Tests: Recent Labs  Lab 07/26/18 1239  AST 52*  ALT 20  ALKPHOS 84  BILITOT 0.9  PROT 6.5  ALBUMIN 3.9   No results for input(s): LIPASE, AMYLASE in the last 168 hours. No results for input(s): AMMONIA in the last 168 hours. Coagulation Profile: No results for input(s): INR, PROTIME in the last 168 hours. Cardiac Enzymes: Recent Labs  Lab 07/26/18 1923 07/27/18 0456  TROPONINI 0.29* 0.22*   BNP (last 3 results) No results for input(s): PROBNP in the last 8760 hours. CBG: Recent Labs  Lab 07/26/18 1235 07/26/18 2143 07/27/18 0721 07/27/18 1221 07/27/18 1619  GLUCAP 264* 169* 136* 150* 134*   Studies: No results found.   Time spent: 35 minutes  Author: Berle Mull, MD [facility] Pager: [phone] 07/27/2018 5:13 PM  If 7PM-7AM, please contact night-coverage at [email or web address], password [facility]

## 2018-07-28 ENCOUNTER — Telehealth (HOSPITAL_COMMUNITY): Payer: Self-pay | Admitting: Internal Medicine

## 2018-07-28 LAB — GLUCOSE, CAPILLARY
Glucose-Capillary: 142 mg/dL — ABNORMAL HIGH (ref 70–99)
Glucose-Capillary: 146 mg/dL — ABNORMAL HIGH (ref 70–99)
Glucose-Capillary: 138 mg/dL — ABNORMAL HIGH (ref 70–99)
Glucose-Capillary: 122 mg/dL — ABNORMAL HIGH (ref 70–99)

## 2018-07-28 LAB — MAGNESIUM: Magnesium: 2.1 mg/dL (ref 1.7–2.4)

## 2018-07-28 LAB — COMPREHENSIVE METABOLIC PANEL
ALT: 20 U/L (ref 0–44)
AST: 30 U/L (ref 15–41)
Albumin: 3.6 g/dL (ref 3.5–5.0)
Alkaline Phosphatase: 56 U/L (ref 38–126)
Anion gap: 7 (ref 5–15)
BUN: 22 mg/dL (ref 8–23)
CO2: 27 mmol/L (ref 22–32)
Calcium: 8.7 mg/dL — ABNORMAL LOW (ref 8.9–10.3)
Chloride: 106 mmol/L (ref 98–111)
Creatinine, Ser: 0.82 mg/dL (ref 0.44–1.00)
GFR calc Af Amer: >60 mL/min (ref >60)
GFR calc non Af Amer: >60 mL/min (ref >60)
Glucose, Bld: 162 mg/dL — ABNORMAL HIGH (ref 70–99)
Potassium: 4 mmol/L (ref 3.5–5.1)
Sodium: 140 mmol/L (ref 135–145)
Total Bilirubin: 0.9 mg/dL (ref 0.3–1.2)
Total Protein: 6 g/dL — ABNORMAL LOW (ref 6.5–8.1)

## 2018-07-28 MED ORDER — METHYLPREDNISOLONE SODIUM SUCC 125 MG IJ SOLR
60.0000 mg | Freq: Two times a day (BID) | INTRAMUSCULAR | Status: DC
  Administered 2018-07-28 – 2018-07-29 (×2): 60 mg via INTRAVENOUS
  Filled 2018-07-28 (×2): qty 2

## 2018-07-28 NOTE — Evaluation (Signed)
Physical Therapy Evaluation Patient Details Name: Kayla Mack MRN: [id number] DOB: 10-10-1946 Today's Date: 07/28/2018   History of Present Illness  Pt. with PMH of severe COPD, HTN, HLD, depression, anxiety; admitted on 07/26/2018, presented with complaint of shortness of breath and confusion, was found to have acute hypoxic respiratory failure on chronic hypoxic.     Clinical Impression  Pt admitted with above diagnosis. Pt currently with functional limitations due to the deficits listed below (see PT Problem List). PTA, pt living alone mod independent with rollator, on 5L of home O2 but turning it up to 10L with activity at times. Today, pt walking hallway S with RW for 100' quickly, desats on 8L to mid 80s, returns quickly to 90's on 8L, satting aroud 92% on 6L at rest.  Pt will benefit from skilled PT to increase their independence and safety with mobility to allow discharge to the venue listed below.       Follow Up Recommendations Outpatient PT;Supervision for mobility/OOB(OP cardiac rehab)    Equipment Recommendations  None recommended by PT    Recommendations for Other Services       Precautions / Restrictions Precautions Precautions: Fall Precaution Comments: watch O2 Restrictions Weight Bearing Restrictions: No      Mobility  Bed Mobility Overal bed mobility: Independent                Transfers Overall transfer level: Modified independent                  Ambulation/Gait Ambulation/Gait assistance: Supervision Gait Distance (Feet): 100 Feet Assistive device: Rolling walker (2 wheeled) Gait Pattern/deviations: Step-to pattern;Step-through pattern Gait velocity: normal   General Gait Details: normal pace, pt DOE 3/4 quickly, SpO2 desats to 85% on 8L, returns with rest and cues for breathing.   Stairs            Wheelchair Mobility    Modified Rankin (Stroke Patients Only)       Balance Overall balance assessment: Mild  deficits observed, not formally tested(can walk aroudn room without RW)                                           Pertinent Vitals/Pain Pain Assessment: No/denies pain    Home Living Family/patient expects to be discharged to:: Private residence Living Arrangements: Alone Available Help at Discharge: Friend(s);Available PRN/intermittently Type of Home: House Home Access: Stairs to enter     Home Layout: One level        Prior Function Level of Independence: Independent               Hand Dominance        Extremity/Trunk Assessment                Communication      Cognition Arousal/Alertness: Awake/alert Behavior During Therapy: Anxious Overall Cognitive Status: Within Functional Limits for tasks assessed                                        General Comments      Exercises     Assessment/Plan    PT Assessment    PT Problem List         PT Treatment Interventions      PT Goals (Current goals  can be found in the Care Plan section)       Frequency Min 3X/week   Barriers to discharge        Co-evaluation               AM-PAC PT "6 Clicks" Daily Activity  Outcome Measure Difficulty turning over in bed (including adjusting bedclothes, sheets and blankets)?: None Difficulty moving from lying on back to sitting on the side of the bed? : None Difficulty sitting down on and standing up from a chair with arms (e.g., wheelchair, bedside commode, etc,.)?: None Help needed moving to and from a bed to chair (including a wheelchair)?: A Little Help needed walking in hospital room?: A Little Help needed climbing 3-5 steps with a railing? : A Little 6 Click Score: 21    End of Session Equipment Utilized During Treatment: Gait belt Activity Tolerance: Patient tolerated treatment well Patient left: in chair;with call bell/phone within reach Nurse Communication: Mobility status PT Visit Diagnosis:  Unsteadiness on feet (R26.81)    Time: 1100-1140 PT Time Calculation (min) (ACUTE ONLY): 40 min   Charges:   PT Evaluation $PT Eval Low Complexity: 1 Low PT Treatments $Gait Training: 8-22 mins        Reinaldo Berber, PT, DPT Acute Rehabilitation Services Pager: [phone] Office: [place] 07/28/2018, 2:44 PM

## 2018-07-28 NOTE — Care Management Note (Signed)
Case Management Note  Patient Details  Name: Kayla Mack MRN: [id number] Date of Birth: 1946/06/24  Subjective/Objective:  From home, has home oxygen 5 liters.  Presents with resp failure, await pt eval.                  Action/Plan: NCM will follow for transition of care needs.   Expected Discharge Date:                  Expected Discharge Plan:  [place]  In-House Referral:     Discharge planning Services  CM Consult  Post Acute Care Choice:    Choice offered to:     DME Arranged:    DME Agency:     HH Arranged:    [place] Agency:     Status of Service:  In process, will continue to follow  If discussed at Long Length of Stay Meetings, dates discussed:    Additional Comments:  Zenon Mayo, RN 07/28/2018, 9:14 AM

## 2018-07-28 NOTE — Progress Notes (Signed)
[facility] Progress Note  Patient: Kayla Mack INO:[id number]   PCP: Jani Gravel, MD DOB: [birth year]   DOA: 07/26/2018   DOS: 07/28/2018   Date of Service: the patient was seen and examined on 07/28/2018  Brief hospital course: Pt. with PMH of severe COPD, HTN, HLD, depression, anxiety; admitted on 07/26/2018, presented with complaint of shortness of breath and confusion, was found to have acute hypoxic respiratory failure on chronic hypoxic. Currently further plan is continue current management.  Subjective: Feeling better this morning.  No confusion noted.  No acute events overnight.  Still has some tightness of breathing.  Assessment and Plan: 1.  Acute metabolic encephalopathy secondary to hypoxia. Acute on chronic respiratory failure. Severe COPD with acute exacerbation At home uses 5 L at rest and 10 L with exertion. And out of her oxygen tank yesterday and was unable to connect. Likely passed out secondary to hypoxia. EMS brought the patient to the hospital.  On BiPAP. This morning on 8 L of oxygen. Doing better able to talk in longer sentences. Continue IV steroids.  Continue nebulizers.  Continue azithromycin. Appreciate PCCM input. Respiratory virus panel is negative. Possibly allergic exposure. Lactic acidosis has resolved.  2.  Essential hypertension. Blood pressure mildly elevated. Resume home bisoprolol but at a lower dose of 2.5 mg daily.  3.  HLD. Continue statin.  4. Anxiety and depression. Patient is on Paxil at home which I will resume. Currently on IV Ativan 0.5 mg every 6 hours as needed.  We will discontinue that medication for now.  5.  Nonobstructive CAD. Resume Plavix.  Diet: Cardiac diet  DVT Prophylaxis: subcutaneous Heparin  Advance goals of care discussion: Appreciate CCM having goals of care discussion.  Currently DNI but would like to be resuscitated  Family Communication: no family was present at bedside, at the time of  interview.   Disposition:  Discharge to home.  Consultants: PCCM  Procedures: Echocardiogram   Scheduled Meds: . arformoterol  15 mcg Nebulization BID  . azithromycin  250 mg Oral Daily  . bisoprolol  2.5 mg Oral Daily  . budesonide (PULMICORT) nebulizer solution  0.5 mg Nebulization BID  . clopidogrel  75 mg Oral Daily  . enoxaparin (LOVENOX) injection  40 mg Subcutaneous Q24H  . fluticasone  2 spray Each Nare Daily  . guaiFENesin  600 mg Oral BID  . insulin aspart  0-5 Units Subcutaneous QHS  . insulin aspart  0-9 Units Subcutaneous TID WC  . ipratropium-albuterol  3 mL Nebulization Q6H  . loratadine  10 mg Oral Daily  . methylPREDNISolone (SOLU-MEDROL) injection  60 mg Intravenous Q12H  . PARoxetine  20 mg Oral Daily  . pravastatin  40 mg Oral q1800   Continuous Infusions: PRN Meds: acetaminophen, albuterol, albuterol, clonazePAM, sodium chloride Antibiotics: Anti-infectives (From admission, onward)   Start     Dose/Rate Route Frequency Ordered Stop   07/27/18 1000  azithromycin (ZITHROMAX) tablet 250 mg     250 mg Oral Daily 07/26/18 1722 07/31/18 0959   07/26/18 1800  azithromycin (ZITHROMAX) tablet 500 mg     500 mg Oral Daily 07/26/18 1722 07/26/18 1855       Objective: Physical Exam: Vitals:   07/28/18 0728 07/28/18 0743 07/28/18 1346 07/28/18 1724  BP: 140/81   (!) 144/82  Pulse: 89   98  Resp: 16   18  Temp: 99.2 F (37.3 C)   99 F (37.2 C)  TempSrc: Oral   Oral  SpO2: 95%  94% 94% 100%    Intake/Output Summary (Last 24 hours) at 07/28/2018 1752 Last data filed at 07/28/2018 1300 Gross per 24 hour  Intake 480 ml  Output 2050 ml  Net -1570 ml   There were no vitals filed for this visit. General: Alert, Awake and Oriented to Time, Place and Person. Appear in moderate distress, affect appropriate Eyes: PERRL, Conjunctiva normal ENT: Oral Mucosa clear moist. Neck: no JVD, no Abnormal Mass Or lumps Cardiovascular: S1 and S2 Present, no Murmur,  Peripheral Pulses Present Respiratory: increased respiratory effort, Bilateral Air entry equal and Decreased, no use of accessory muscle, no Crackles, Occasional  wheezes Abdomen: Bowel Sound present, Soft and no tenderness, no hernia Skin: no redness, no Rash, no induration Extremities: no Pedal edema, no calf tenderness Neurologic: Grossly no focal neuro deficit. Bilaterally Equal motor strength  Data Reviewed: CBC: Recent Labs  Lab 07/26/18 1239 07/26/18 1249 07/28/18 0809  WBC 15.0*  --  17.2*  NEUTROABS  --   --  15.7*  HGB 13.1 14.6 11.0*  HCT 45.0 43.0 35.6*  MCV 101.1*  --  94.4  PLT 343  --  709   Basic Metabolic Panel: Recent Labs  Lab 07/26/18 1239 07/26/18 1249 07/27/18 0456 07/28/18 0248  NA 137 137 139 140  K 3.8 3.7 4.3 4.0  CL 104 104 107 106  CO2 20*  --  20* 27  GLUCOSE 303* 306* 161* 162*  BUN _0 CREATININE 1.08* 0.90 0.83 0.82  CALCIUM 8.8*  --  8.1* 8.7*  MG  --   --   --  2.1    Liver Function Tests: Recent Labs  Lab 07/26/18 1239 07/28/18 0248  AST 52* 30  ALT 20 20  ALKPHOS 84 56  BILITOT 0.9 0.9  PROT 6.5 6.0*  ALBUMIN 3.9 3.6   No results for input(s): LIPASE, AMYLASE in the last 168 hours. No results for input(s): AMMONIA in the last 168 hours. Coagulation Profile: No results for input(s): INR, PROTIME in the last 168 hours. Cardiac Enzymes: Recent Labs  Lab 07/26/18 1923 07/27/18 0456  TROPONINI 0.29* 0.22*   BNP (last 3 results) No results for input(s): PROBNP in the last 8760 hours. CBG: Recent Labs  Lab 07/27/18 1619 07/27/18 2113 07/28/18 0718 07/28/18 1234 07/28/18 1602  GLUCAP 134* 157* 142* 146* 138*   Studies: No results found.   Time spent: 35 minutes  Author: Berle Mull, MD [facility] Pager: [phone] 07/28/2018 5:52 PM  If 7PM-7AM, please contact night-coverage at [email or web address], password [facility]

## 2018-07-28 NOTE — Care Management Important Message (Signed)
Important Message  Patient Details  Name: Kayla Mack MRN: [id number] Date of Birth: Dec 27, 1945   Medicare Important Message Given:  Yes    Jordin Dambrosio 07/28/2018, 12:40 PM

## 2018-07-28 NOTE — Progress Notes (Signed)
RT increased FIO2 8L HFNC due to low sats.

## 2018-07-29 LAB — CBC WITH DIFFERENTIAL/PLATELET
Abs Immature Granulocytes: 0.12 10*3/uL — ABNORMAL HIGH (ref 0.00–0.07)
Basophils Absolute: 0 10*3/uL (ref 0.0–0.1)
Basophils Relative: 0 %
Eosinophils Absolute: 0 10*3/uL (ref 0.0–0.5)
Eosinophils Relative: 0 %
HCT: 35.6 % — ABNORMAL LOW (ref 36.0–46.0)
Hemoglobin: 11 g/dL — ABNORMAL LOW (ref 12.0–15.0)
Immature Granulocytes: 1 %
Lymphocytes Relative: 5 %
Lymphs Abs: 0.9 10*3/uL (ref 0.7–4.0)
MCH: 29.2 pg (ref 26.0–34.0)
MCHC: 30.9 g/dL (ref 30.0–36.0)
MCV: 94.4 fL (ref 80.0–100.0)
Monocytes Absolute: 0.5 10*3/uL (ref 0.1–1.0)
Monocytes Relative: 3 %
Neutro Abs: 15.7 10*3/uL — ABNORMAL HIGH (ref 1.7–7.7)
Neutrophils Relative %: 91 %
Platelets: 263 10*3/uL (ref 150–400)
RBC: 3.77 MIL/uL — ABNORMAL LOW (ref 3.87–5.11)
RDW: 13.3 % (ref 11.5–15.5)
WBC: 17.2 10*3/uL — ABNORMAL HIGH (ref 4.0–10.5)
nRBC: 0 % (ref 0.0–0.2)

## 2018-07-29 LAB — GLUCOSE, CAPILLARY
Glucose-Capillary: 122 mg/dL — ABNORMAL HIGH (ref 70–99)
Glucose-Capillary: 102 mg/dL — ABNORMAL HIGH (ref 70–99)
Glucose-Capillary: 117 mg/dL — ABNORMAL HIGH (ref 70–99)
Glucose-Capillary: 117 mg/dL — ABNORMAL HIGH (ref 70–99)

## 2018-07-29 MED ORDER — PREDNISONE 20 MG PO TABS
50.0000 mg | ORAL_TABLET | Freq: Every day | ORAL | Status: DC
  Administered 2018-07-30: 50 mg via ORAL
  Filled 2018-07-29: qty 2

## 2018-07-29 MED ORDER — BISACODYL 5 MG PO TBEC
5.0000 mg | DELAYED_RELEASE_TABLET | Freq: Every day | ORAL | Status: DC | PRN
  Administered 2018-07-29: 5 mg via ORAL
  Filled 2018-07-29: qty 1

## 2018-07-29 NOTE — Progress Notes (Signed)
SATURATION QUALIFICATIONS: (This note is used to comply with regulatory documentation for home oxygen)  Patient Saturations on Room Air at Rest =UTA  Patient Saturations on Room Air while Ambulating =UTA   Patient Saturations on 6 Liters of oxygen while Ambulating = 85%  Please briefly explain why patient needs home oxygen: COPD with chronic oxygen requirements

## 2018-07-29 NOTE — Plan of Care (Signed)
Discussed plan of care for the evening with patient.  Emphasized the importance of using the call bell when assistance is needed.  Good teach back displayed. Will continue to monitor

## 2018-07-29 NOTE — Progress Notes (Signed)
[facility] Progress Note  Patient: Kayla Mack   PCP: Jani Gravel, MD DOB: 04/19/1946   DOA: 07/26/2018   DOS: 07/29/2018   Date of Service: the patient was seen and examined on 07/29/2018  Brief hospital course: Pt. with PMH of severe COPD, HTN, HLD, depression, anxiety; admitted on 07/26/2018, presented with complaint of shortness of breath and confusion, was found to have acute hypoxic respiratory failure on chronic hypoxic. Currently further plan is continue current management.  Subjective: At rest her breathing is better and almost close to baseline.  She participates in cardiopulmonary rehab and walking around the room reaching out to the restroom is possible right now due to her severe breathing per the patient.  No chest pain no abdominal pain no fever no chills no nausea no vomiting.  Oral intake is better.  Slept well last night.  Assessment and Plan: 1.  Acute metabolic encephalopathy secondary to hypoxia. Acute on chronic respiratory failure. Severe COPD with acute exacerbation At home uses 5 L at rest and 10 L with exertion. And out of her oxygen tank yesterday and was unable to connect. Likely passed out secondary to hypoxia. EMS brought the patient to the hospital On BiPAP. This morning still on 8 L of oxygen. Doing better able to talk in longer sentences. Continue steroids, change to oral prednisone.  Continue nebulizers.  Continue azithromycin. Appreciate PCCM input. Respiratory virus panel is negative. Possibly allergic exposure. Lactic acidosis has resolved.  2.  Essential hypertension. Blood pressure remains well controlled on 2.5 mg bisoprolol Resume home bisoprolol but at a lower dose of 2.5 mg daily.  3.  HLD. Continue statin.  4. Anxiety and depression. Patient is on Paxil at home which I will resume.  5.  Nonobstructive CAD. Resume Plavix.  Diet: Cardiac diet  DVT Prophylaxis: subcutaneous Heparin  Advance goals of care  discussion: Appreciate CCM having goals of care discussion.  Currently DNI but would like to be resuscitated  Family Communication: no family was present at bedside, at the time of interview.   Disposition:  Discharge to home.  Consultants: PCCM  Procedures: Echocardiogram   Scheduled Meds: . arformoterol  15 mcg Nebulization BID  . azithromycin  250 mg Oral Daily  . bisoprolol  2.5 mg Oral Daily  . budesonide (PULMICORT) nebulizer solution  0.5 mg Nebulization BID  . clopidogrel  75 mg Oral Daily  . enoxaparin (LOVENOX) injection  40 mg Subcutaneous Q24H  . fluticasone  2 spray Each Nare Daily  . guaiFENesin  600 mg Oral BID  . insulin aspart  0-5 Units Subcutaneous QHS  . insulin aspart  0-9 Units Subcutaneous TID WC  . ipratropium-albuterol  3 mL Nebulization Q6H  . loratadine  10 mg Oral Daily  . PARoxetine  20 mg Oral Daily  . pravastatin  40 mg Oral q1800  . [START ON 07/30/2018] predniSONE  50 mg Oral Q breakfast   Continuous Infusions: PRN Meds: acetaminophen, albuterol, albuterol, clonazePAM, sodium chloride Antibiotics: Anti-infectives (From admission, onward)   Start     Dose/Rate Route Frequency Ordered Stop   07/27/18 1000  azithromycin (ZITHROMAX) tablet 250 mg     250 mg Oral Daily 07/26/18 1722 07/31/18 0959   07/26/18 1800  azithromycin (ZITHROMAX) tablet 500 mg     500 mg Oral Daily 07/26/18 1722 07/26/18 1855       Objective: Physical Exam: Vitals:   07/28/18 2338 07/29/18 0723 07/29/18 0729 07/29/18 1311  BP: 137/87  133/82  Pulse: 82  75   Resp: 18  19   Temp: 98 F (36.7 C)  97.6 F (36.4 C)   TempSrc: Oral  Oral   SpO2: 98% 96% 95% 95%   No intake or output data in the 24 hours ending 07/29/18 1543 There were no vitals filed for this visit. General: Alert, Awake and Oriented to Time, Place and Person. Appear in moderate distress, affect appropriate Eyes: PERRL, Conjunctiva normal ENT: Oral Mucosa clear moist. Neck: no JVD, no Abnormal  Mass Or lumps Cardiovascular: S1 and S2 Present, no Murmur, Peripheral Pulses Present Respiratory: increased respiratory effort, Bilateral Air entry equal and Decreased, no use of accessory muscle, no Crackles, Occasional  wheezes Abdomen: Bowel Sound present, Soft and no tenderness, no hernia Skin: no redness, no Rash, no induration Extremities: no Pedal edema, no calf tenderness Neurologic: Grossly no focal neuro deficit. Bilaterally Equal motor strength  Data Reviewed: CBC: Recent Labs  Lab 07/26/18 1239 07/26/18 1249 07/28/18 0809  WBC 15.0*  --  17.2*  NEUTROABS  --   --  15.7*  HGB 13.1 14.6 11.0*  HCT 45.0 43.0 35.6*  MCV 101.1*  --  94.4  PLT 343  --  034   Basic Metabolic Panel: Recent Labs  Lab 07/26/18 1239 07/26/18 1249 07/27/18 0456 07/28/18 0248  NA 137 137 139 140  K 3.8 3.7 4.3 4.0  CL 104 104 107 106  CO2 20*  --  20* 27  GLUCOSE 303* 306* 161* 162*  BUN _0 CREATININE 1.08* 0.90 0.83 0.82  CALCIUM 8.8*  --  8.1* 8.7*  MG  --   --   --  2.1    Liver Function Tests: Recent Labs  Lab 07/26/18 1239 07/28/18 0248  AST 52* 30  ALT 20 20  ALKPHOS 84 56  BILITOT 0.9 0.9  PROT 6.5 6.0*  ALBUMIN 3.9 3.6   No results for input(s): LIPASE, AMYLASE in the last 168 hours. No results for input(s): AMMONIA in the last 168 hours. Coagulation Profile: No results for input(s): INR, PROTIME in the last 168 hours. Cardiac Enzymes: Recent Labs  Lab 07/26/18 1923 07/27/18 0456  TROPONINI 0.29* 0.22*   BNP (last 3 results) No results for input(s): PROBNP in the last 8760 hours. CBG: Recent Labs  Lab 07/28/18 1234 07/28/18 1602 07/28/18 2059 07/29/18 0731 07/29/18 1150  GLUCAP 146* 138* 122* 122* 102*   Studies: No results found.   Time spent: 35 minutes  Author: Berle Mull, MD [facility] Pager: [phone] 07/29/2018 3:43 PM  If 7PM-7AM, please contact night-coverage at [email or web address], password [facility]

## 2018-07-30 LAB — MAGNESIUM: Magnesium: 2.1 mg/dL (ref 1.7–2.4)

## 2018-07-30 LAB — CBC
HCT: 33.3 % — ABNORMAL LOW (ref 36.0–46.0)
Hemoglobin: 10 g/dL — ABNORMAL LOW (ref 12.0–15.0)
MCH: 28.8 pg (ref 26.0–34.0)
MCHC: 30 g/dL (ref 30.0–36.0)
MCV: 96 fL (ref 80.0–100.0)
Platelets: 226 10*3/uL (ref 150–400)
RBC: 3.47 MIL/uL — ABNORMAL LOW (ref 3.87–5.11)
RDW: 13.5 % (ref 11.5–15.5)
WBC: 12.2 10*3/uL — ABNORMAL HIGH (ref 4.0–10.5)
nRBC: 0 % (ref 0.0–0.2)

## 2018-07-30 LAB — BASIC METABOLIC PANEL
Anion gap: 9 (ref 5–15)
BUN: 23 mg/dL (ref 8–23)
CO2: 25 mmol/L (ref 22–32)
Calcium: 8.4 mg/dL — ABNORMAL LOW (ref 8.9–10.3)
Chloride: 105 mmol/L (ref 98–111)
Creatinine, Ser: 0.73 mg/dL (ref 0.44–1.00)
GFR calc Af Amer: >60 mL/min (ref >60)
GFR calc non Af Amer: >60 mL/min (ref >60)
Glucose, Bld: 96 mg/dL (ref 70–99)
Potassium: 4 mmol/L (ref 3.5–5.1)
Sodium: 139 mmol/L (ref 135–145)

## 2018-07-30 LAB — GLUCOSE, CAPILLARY: Glucose-Capillary: 82 mg/dL (ref 70–99)

## 2018-07-30 MED ORDER — FLUTICASONE PROPIONATE 50 MCG/ACT NA SUSP: 2.0000 | Freq: Every day | NASAL | 0 refills | Status: DC

## 2018-07-30 MED ORDER — BUDESONIDE 0.5 MG/2ML IN SUSP: 0.5000 mg | Freq: Two times a day (BID) | RESPIRATORY_TRACT | 0 refills | Status: DC

## 2018-07-30 MED ORDER — ALBUTEROL SULFATE (2.5 MG/3ML) 0.083% IN NEBU: 2.5000 mg | INHALATION_SOLUTION | RESPIRATORY_TRACT | 0 refills | Status: DC | PRN

## 2018-07-30 MED ORDER — ARFORMOTEROL TARTRATE 15 MCG/2ML IN NEBU: 15.0000 ug | INHALATION_SOLUTION | Freq: Two times a day (BID) | RESPIRATORY_TRACT | 0 refills | Status: DC

## 2018-07-30 MED ORDER — GUAIFENESIN ER 600 MG PO TB12: 600.0000 mg | ORAL_TABLET | Freq: Two times a day (BID) | ORAL | 0 refills | Status: AC

## 2018-07-30 MED ORDER — PREDNISONE 10 MG PO TABS: ORAL_TABLET | ORAL | 0 refills | Status: DC

## 2018-07-30 MED ORDER — IPRATROPIUM-ALBUTEROL 0.5-2.5 (3) MG/3ML IN SOLN
3.0000 mL | Freq: Three times a day (TID) | RESPIRATORY_TRACT | Status: DC
  Administered 2018-07-30 (×2): 3 mL via RESPIRATORY_TRACT
  Filled 2018-07-30 (×2): qty 3

## 2018-07-30 MED ORDER — IPRATROPIUM-ALBUTEROL 0.5-2.5 (3) MG/3ML IN SOLN: 3.0000 mL | Freq: Three times a day (TID) | RESPIRATORY_TRACT | 0 refills | Status: DC

## 2018-07-30 MED ORDER — LORATADINE 10 MG PO TABS: 10.0000 mg | ORAL_TABLET | Freq: Every day | ORAL | 0 refills | Status: AC

## 2018-07-30 NOTE — Care Management Note (Addendum)
Case Management Note  Patient Details  Name: Kayla Mack MRN: [id number] Date of Birth: 26-Apr-1946  Subjective/Objective:                 Severe COPD. Requiring 10L O2, and Brovana   Action/Plan:  Patient from home alone. 1- Patient has O2 at 6L through [facility], will DC on 10 L. Spoke w Caryl Pina at [facility] who is checking if 10L concentrator can be delivered to house today. Caryl Pina states her portable O2 needs will be addressed Monday or Tuesday of this week. Patient understands that she will be homebound until portable oxygen is set up through [facility].  Patient has nebulizer. 2- Patient will need [facility] transport home. Forms will placed on front of chart. Patient to call her friend after church at 12:00. Friend has her key to the house. Patient will ask her if she can open the house and stay and wait for O2 concentrator to be delivered, as well as pick up Rxs for her today. O2 will need to at home prior to calling [facility].  3- Patient will be provided with 2 week coupon for [facility] and two week Rx, she will not be on medication beyond the two weeks per Dr Posey Pronto. 4- Home health will be provided by [facility] home first program. 5- Referral has been made for [facility] community case manager.   13:45 Spoke w patient's friend who is at her house, ready to receive new oxygen concentrator. She is also agreeable to go get new Rx's tonight for patient. Received call from [place], tech at her house, he is delivering new concentrator. He states he will wait at house for her to arrive to ensure she is set up well and comfortable.  Called [facility] to verify they have [place] in stock, as [facility] did not. They do, Rx and coupon faxed. Also provided to patient to take home. She understands her Rxs are at two different pharmacies. Called and arranged [facility] to pick up.   Bedside RN updated, patient OK to DC.   Expected Discharge Date:  07/30/18               Expected Discharge Plan:  [place]  In-House Referral:     Discharge planning Services  CM Consult, Medication Assistance  Post Acute Care Choice:  Home Health Choice offered to:  Patient  DME Arranged:  Oxygen DME Agency:  [facility]  HH Arranged:  RN, PT, OT, Nurse's Aide [place] Agency:  [place]  Status of Service:  Completed, signed off  If discussed at [place] of Stay Meetings, dates discussed:    Additional Comments:  Carles Collet, RN 07/30/2018, 9:53 AM

## 2018-07-30 NOTE — Progress Notes (Signed)
SATURATION QUALIFICATIONS: (This note is used to comply with regulatory documentation for home oxygen)  Patient Saturations on Room Air at Rest = UTA  Patient Saturations on Room Air while Ambulating =UTA  Patient Saturations on 10 Liters of oxygen while Ambulating = 90%  Please briefly explain why patient needs home oxygen: COPD and chronic O2

## 2018-07-31 ENCOUNTER — Other Ambulatory Visit: Payer: Self-pay

## 2018-07-31 ENCOUNTER — Telehealth: Payer: Self-pay | Admitting: Emergency Medicine

## 2018-07-31 ENCOUNTER — Other Ambulatory Visit: Payer: Self-pay | Admitting: *Deleted

## 2018-07-31 ENCOUNTER — Telehealth: Payer: Self-pay | Admitting: Internal Medicine

## 2018-07-31 ENCOUNTER — Telehealth: Payer: Self-pay

## 2018-07-31 DIAGNOSIS — J9621 Acute and chronic respiratory failure with hypoxia: Secondary | ICD-10-CM | POA: Diagnosis not present

## 2018-07-31 DIAGNOSIS — J439 Emphysema, unspecified: Secondary | ICD-10-CM | POA: Diagnosis not present

## 2018-07-31 MED ORDER — BUDESONIDE 0.5 MG/2ML IN SUSP: 0.5000 mg | Freq: Two times a day (BID) | RESPIRATORY_TRACT | 0 refills | Status: DC

## 2018-07-31 MED ORDER — IPRATROPIUM-ALBUTEROL 0.5-2.5 (3) MG/3ML IN SOLN: 3.0000 mL | Freq: Three times a day (TID) | RESPIRATORY_TRACT | 0 refills | Status: DC

## 2018-07-31 NOTE — Telephone Encounter (Signed)
Called and spoke with patient. She states she was told by Dr. Posey Pronto not to take multiple nebulizers, but she couldn't remember which one he told her to stop. Patient states she is only taking the brovana.  TP please advise.

## 2018-07-31 NOTE — Telephone Encounter (Signed)
Attempted to call patient mobile number just rings and no voicemail will pick up. Patient needs a Hospital F/U appt next week per TP. Will attempt to call back later in day.

## 2018-07-31 NOTE — Patient Outreach (Signed)
[facility]) Care Management  07/31/2018  Darien Mignogna Cantrell 02-Jan-1946 [id number]   Care Coordination: Client discharged to home with the [place] will not open case.  Thea Silversmith, RN, MSN, [place] Coordinator Cell: [phone]

## 2018-07-31 NOTE — Telephone Encounter (Signed)
Called and spoke with pt stating to her the information per RB.  Stated to pt if it continues, we needed her to schedule an OV this week with a cxr.  Pt expressed understanding and stated she would call us back for a sooner appt if it continued.  Nothing further needed.

## 2018-07-31 NOTE — Telephone Encounter (Signed)
Called and spoke with pt who stated she started coughing up bloody mucus last night 10/13 which was dark but then today, 10/14 she was coughing up bright red blood.  Pt also states she has some mild blood from the left nostril  Pt is scheduled to come in for a hospital follow up with TP 08/07/18.  Dr. Lamonte Sakai, please advise on recommendations for pt. Thanks!

## 2018-07-31 NOTE — Telephone Encounter (Signed)
[place], medications were sent over by Dr. Serita Grit office. The pharmacy said not DX codes were attached and that is all they needed. She also said once I send them it shouldn't cost patient anything going through medicare.  I resent medication with dx codes attached. Called patient to let her know these are at the [facility]

## 2018-07-31 NOTE — Telephone Encounter (Signed)
Patient returned call, scheduled hospital follow up for 08/07/2018 with TP.  May close message.

## 2018-07-31 NOTE — Consult Note (Signed)
Made aware by Kayla Mack with Kayla Mack that Kayla Mack enrolled in the [place] program.   Notification sent to [place] Management office to make aware of Kayla Mack's enrollment with [place], RN,BSN [facility] Liaison [phone]

## 2018-07-31 NOTE — Telephone Encounter (Signed)
Let her know that I suspect that the blood is coming from her nose, going to her chest. If it continues I would favor having her seen sooner than 10/21 with a CXR.

## 2018-07-31 NOTE — Telephone Encounter (Signed)
Nothing further needed.   Route to TP for FYI

## 2018-07-31 NOTE — Telephone Encounter (Signed)
Talked with patient.  Patient's provider was called to the [facility] which she was able to get.  She was also supposed to have gotten DuoNeb and Pulmicort please call the pharmacy at [facility] and see why she did not receive these medications she is aware that she is supposed to hold her Trelegy inhaler while on this until she comes back for her post hospital follow-up next week  Please find out how much Pulmicort will call us and how much DuoNeb will calls and also please identify why they were not filled for her.

## 2018-07-31 NOTE — Discharge Summary (Signed)
[facility] Discharge Summary   Patient: Kayla Mack KNL:[id number]   PCP: Jani Gravel, MD DOB: 04/28/1946   Date of admission: 07/26/2018   Date of discharge: 07/30/2018    Discharge Diagnoses:  Active Problems:   Respiratory failure ([place])   Admitted From: home  Disposition:  Home with home first program, pt lives alone and has needs for very high oxygen  Recommendations for Outpatient Follow-up:  1. Please follow up with PCP and Dr Lamonte Sakai in 1 month   Follow-up Information    Jani Gravel, MD. Schedule an appointment as soon as possible for a visit in 1 week(s).   Specialty:  Internal Medicine Contact information: [address] [place] [address] [phone]        Collene Gobble, MD. Schedule an appointment as soon as possible for a visit in 1 week(s).   Specialty:  Pulmonary Disease Contact information: [address] [place] [address] [phone]        Care, [facility] Follow up.   Specialty:  Home Health Services Contact information: [place] [address] [phone]          Diet recommendation: cardiac diet  Activity: The patient is advised to gradually reintroduce usual activities.  Discharge Condition: good  Code Status: DNI  History of present illness: As per the H and P dictated on admission, "Kayla Mack  is a 72 y.o. female, with past medical history significant for severe COPD and pulmonary hypertension on oxygen for to 5 L/min baseline who ran out of her oxygen while at church today and was brought to the emergency room obtunded with pulse ox in the 70s.  The patient was started on BiPAP and her mental status improved significantly.  Patient received fluid boluses for hypotension and severe lactacidosis in the emergency room.  Portable chest x-ray showed bullous emphysema with no congestive heart failure and her EKG showed sinus tachycardia with RVH.  I was able to  communicate with her with the BiPAP on."  Hospital Course:  Summary of her active problems in the hospital is as following. 1.  Acute metabolic encephalopathy secondary to hypoxia. Acute on chronic respiratory failure. Severe COPD with acute exacerbation At home uses 6 L at rest and 10 L with exertion. Pt was out of her oxygen tank and was unable to connect. Likely passed out secondary to hypoxia. EMS brought the patient to the hospital On BiPAP. Doing better able to talk in longer sentences. Continue steroids, change to oral prednisone.  Continue nebulizers. Continue azithromycin. Respiratory virus panel is negative. Possibly allergic exposure. Lactic acidosis has resolved.  2.  Essential hypertension. Blood pressure remains well controlled on 2.5 mg bisoprolol Resume home bisoprolol  3.  HLD. Continue statin.  4. Anxiety and depression. Patient is on Paxil at home which I will resume.  5.  Nonobstructive CAD. Resume Plavix.  All other chronic medical condition were stable during the hospitalization.  Patient was seen by physical therapy. Home health was arranged by Education officer, museum and case Freight forwarder. On the day of the discharge the patient's vitals were stable , and no other acute medical condition were reported by patient. the patient was felt safe to be discharge at home with home health.  Consultants: PCCM  Procedures: none  DISCHARGE MEDICATION: Allergies as of 07/30/2018      Reactions   Aleve [naproxen Sodium] Hives   Aspirin Swelling   Facial swelling   Penicillins Hives,  Swelling   Has patient had a PCN reaction causing immediate rash, facial/tongue/throat swelling, SOB or lightheadedness with hypotension: Yes Has patient had a PCN reaction causing severe rash involving mucus membranes or skin necrosis: Yes Has patient had a PCN reaction that required hospitalization: Unk Has patient had a PCN reaction occurring within the last 10 years: No If all of the  above answers are "NO", then may proceed with Cephalosporin use.      Medication List    TAKE these medications   acetaminophen 500 MG tablet Commonly known as:  TYLENOL Take 1,000 mg by mouth 2 (two) times daily as needed for pain.   arformoterol 15 MCG/2ML Nebu Commonly known as:  BROVANA Take 2 mLs (15 mcg total) by nebulization 2 (two) times daily for 7 days.   bisoprolol 5 MG tablet Commonly known as:  ZEBETA Take 1 tablet (5 mg total) by mouth daily.   budesonide 0.5 MG/2ML nebulizer solution Commonly known as:  PULMICORT Take 2 mLs (0.5 mg total) by nebulization 2 (two) times daily for 7 days.   calcium-vitamin D 500-200 MG-UNIT tablet Commonly known as:  OSCAL WITH D Take 1 tablet by mouth 2 (two) times daily. What changed:  when to take this   clonazePAM 0.5 MG tablet Commonly known as:  KLONOPIN Take 1 tablet (0.5 mg total) by mouth 2 (two) times daily as needed for anxiety. What changed:    when to take this  reasons to take this   clopidogrel 75 MG tablet Commonly known as:  PLAVIX Take 1 tablet (75 mg total) by mouth daily.   diphenhydrAMINE 25 mg capsule Commonly known as:  BENADRYL Take 25 mg by mouth every 6 (six) hours as needed for allergies.   fluticasone 50 MCG/ACT nasal spray Commonly known as:  FLONASE Place 2 sprays into both nostrils daily.   Fluticasone-Umeclidin-Vilant 100-62.5-25 MCG/INH Aepb Inhale 1 puff into the lungs daily.   guaiFENesin 600 MG 12 hr tablet Commonly known as:  MUCINEX Take 1 tablet (600 mg total) by mouth 2 (two) times daily.   ipratropium-albuterol 0.5-2.5 (3) MG/3ML Soln Commonly known as:  DUONEB Take 3 mLs by nebulization 3 (three) times daily.   loratadine 10 MG tablet Commonly known as:  CLARITIN Take 1 tablet (10 mg total) by mouth daily.   lovastatin 40 MG tablet Commonly known as:  MEVACOR Take 40 mg by mouth every morning.   PARoxetine 20 MG tablet Commonly known as:  PAXIL Take 20 mg by  mouth daily.   predniSONE 10 MG tablet Commonly known as:  DELTASONE Take 44m daily for 3days,Take 457mdaily for 3days,Take 3041maily for 3days,Take 78m24mily for 3days,Take 10mg64mly for 3days, then stop   sodium chloride 0.65 % Soln nasal spray Commonly known as:  OCEAN Place 1 spray into both nostrils as needed for congestion.   VENTOLIN HFA 108 (90 Base) MCG/ACT inhaler Generic drug:  albuterol Inhale 2 puffs into the lungs every 6 (six) hours as needed for wheezing or shortness of breath.   albuterol (2.5 MG/3ML) 0.083% nebulizer solution Commonly known as:  PROVENTIL Take 3 mLs (2.5 mg total) by nebulization every 4 (four) hours as needed for wheezing or shortness of breath.      Allergies  Allergen Reactions  . Aleve [Naproxen Sodium] Hives  . Aspirin Swelling    Facial swelling  . Penicillins Hives and Swelling    Has patient had a PCN reaction causing immediate rash, facial/tongue/throat swelling, SOB  or lightheadedness with hypotension: Yes Has patient had a PCN reaction causing severe rash involving mucus membranes or skin necrosis: Yes Has patient had a PCN reaction that required hospitalization: Unk Has patient had a PCN reaction occurring within the last 10 years: No If all of the above answers are "NO", then may proceed with Cephalosporin use.    Discharge Instructions    Diet - low sodium heart healthy   Complete by:  As directed    Discharge instructions   Complete by:  As directed    It is important that you read following instructions as well as go over your medication list with RN to help you understand your care after this hospitalization.  Discharge Instructions: Please follow-up with PCP in one week  Please request your primary care physician to go over all Hospital Tests and Procedure/Radiological results at the follow up,  Please get all Hospital records sent to your PCP by signing hospital release before you go home.   Do not take more than  prescribed Pain, Sleep and Anxiety Medications. You were cared for by a hospitalist during your hospital stay. If you have any questions about your discharge medications or the care you received while you were in the hospital after you are discharged, you can call the unit and ask to speak with the hospitalist on call if the hospitalist that took care of you is not available.  Once you are discharged, your primary care physician will handle any further medical issues. Please note that NO REFILLS for any discharge medications will be authorized once you are discharged, as it is imperative that you return to your primary care physician (or establish a relationship with a primary care physician if you do not have one) for your aftercare needs so that they can reassess your need for medications and monitor your lab values. You Must read complete instructions/literature along with all the possible adverse reactions/side effects for all the Medicines you take and that have been prescribed to you. Take any new Medicines after you have completely understood and accept all the possible adverse reactions/side effects. Wear Seat belts while driving. If you have smoked or chewed Tobacco in the last 2 yrs please stop smoking and/or stop any Recreational drug use.   Increase activity slowly   Complete by:  As directed      Discharge Exam: There were no vitals filed for this visit. Vitals:   07/30/18 0808 07/30/18 1348  BP: 134/72   Pulse: 91   Resp: 20   Temp: (!) 97.3 F (36.3 C)   SpO2: 94% 98%   General: Appear in no distress, no Rash; Oral Mucosa moist. Cardiovascular: S1 and S2 Present, no Murmur, no JVD Respiratory: Bilateral Air entry present and Clear to Auscultation, no Crackles, no wheezes Abdomen: Bowel Sound present, Soft and no tenderness Extremities: no Pedal edema, no calf tenderness Neurology: Grossly no focal neuro deficit.  The results of significant diagnostics from this  hospitalization (including imaging, microbiology, ancillary and laboratory) are listed below for reference.    Significant Diagnostic Studies: Dg Chest Portable 1 View  Result Date: 07/26/2018 CLINICAL DATA:  Respiratory distress, history of COPD, trauma. EXAM: PORTABLE CHEST 1 VIEW COMPARISON:  Portable chest x-ray of April 02, 2018 FINDINGS: The lungs remain hyperinflated. The interstitial markings remain increased in the mid and lower lungs bilaterally. Bullous lesions in the apices bilaterally are stable. The heart and pulmonary vascularity are normal. There is calcification in the wall of the  aortic arch. There is no pleural effusion or pneumothorax. IMPRESSION: Bullous emphysema. Increased interstitial markings in the mid and lower lungs is chronic but superimposed acute bronchitis may be present. No CHF. Thoracic aortic atherosclerosis. Electronically Signed   By: David  Martinique M.D.   On: 07/26/2018 13:03    Microbiology: Recent Results (from the past 240 hour(s))  Respiratory Panel by PCR     Status: None   Collection Time: 07/26/18  9:48 PM  Result Value Ref Range Status   Adenovirus NOT DETECTED NOT DETECTED Final   Coronavirus 229E NOT DETECTED NOT DETECTED Final   Coronavirus HKU1 NOT DETECTED NOT DETECTED Final   Coronavirus NL63 NOT DETECTED NOT DETECTED Final   Coronavirus OC43 NOT DETECTED NOT DETECTED Final   Metapneumovirus NOT DETECTED NOT DETECTED Final   Rhinovirus / Enterovirus NOT DETECTED NOT DETECTED Final   Influenza A NOT DETECTED NOT DETECTED Final   Influenza B NOT DETECTED NOT DETECTED Final   Parainfluenza Virus 1 NOT DETECTED NOT DETECTED Final   Parainfluenza Virus 2 NOT DETECTED NOT DETECTED Final   Parainfluenza Virus 3 NOT DETECTED NOT DETECTED Final   Parainfluenza Virus 4 NOT DETECTED NOT DETECTED Final   Respiratory Syncytial Virus NOT DETECTED NOT DETECTED Final   Bordetella pertussis NOT DETECTED NOT DETECTED Final   Chlamydophila pneumoniae NOT  DETECTED NOT DETECTED Final   Mycoplasma pneumoniae NOT DETECTED NOT DETECTED Final    Comment: Performed at [facility], [address], [place], [place] [address]  MRSA PCR Screening     Status: None   Collection Time: 07/26/18  9:48 PM  Result Value Ref Range Status   MRSA by PCR NEGATIVE NEGATIVE Final    Comment:        The GeneXpert MRSA Assay (FDA approved for NASAL specimens only), is one component of a comprehensive MRSA colonization surveillance program. It is not intended to diagnose MRSA infection nor to guide or monitor treatment for MRSA infections. Performed at [facility], [place] [address] [place], [place] [address]      Labs: CBC: Recent Labs  Lab 07/26/18 1239 07/26/18 1249 07/28/18 0809 07/30/18 0229  WBC 15.0*  --  17.2* 12.2*  NEUTROABS  --   --  15.7*  --   HGB 13.1 14.6 11.0* 10.0*  HCT 45.0 43.0 35.6* 33.3*  MCV 101.1*  --  94.4 96.0  PLT 343  --  263 623   Basic Metabolic Panel: Recent Labs  Lab 07/26/18 1239 07/26/18 1249 07/27/18 0456 07/28/18 0248 07/30/18 0229  NA 137 137 139 140 139  K 3.8 3.7 4.3 4.0 4.0  CL 104 104 107 106 105  CO2 20*  --  20* 27 25  GLUCOSE 303* 306* 161* 162* 96  BUN _0 CREATININE 1.08* 0.90 0.83 0.82 0.73  CALCIUM 8.8*  --  8.1* 8.7* 8.4*  MG  --   --   --  2.1 2.1   Liver Function Tests: Recent Labs  Lab 07/26/18 1239 07/28/18 0248  AST 52* 30  ALT 20 20  ALKPHOS 84 56  BILITOT 0.9 0.9  PROT 6.5 6.0*  ALBUMIN 3.9 3.6   No results for input(s): LIPASE, AMYLASE in the last 168 hours. No results for input(s): AMMONIA in the last 168 hours. Cardiac Enzymes: Recent Labs  Lab 07/26/18 1923 07/27/18 0456  TROPONINI 0.29* 0.22*   BNP (last 3 results) Recent Labs    04/02/18 0400 07/26/18  1233  BNP 131.2* 338.0*   CBG: Recent Labs  Lab 07/29/18 0731 07/29/18 1150 07/29/18 1635 07/29/18 2117 07/30/18 0717  GLUCAP 122* 102* 117* 117* 82   Time spent: 35  minutes  Signed:  Berle Mull  [facility] 07/30/2018 , 7:30 AM

## 2018-07-31 NOTE — Telephone Encounter (Signed)
I was called regarding prescription for DuoNeb's.  I called the pharmacy and talk with the staff.  Pharmacist was talking about prescription for Brovana and budesonide.  She was seeing that DuoNeb somehow fell off her list. I informed her that the [facility] prescription is already taken care by our case manager. Patient will definitely benefit from budesonide nebulization and pharmacist will follow up on that. New prescription for DuoNeb was also prescribed since the earlier one fell off the chart. Author:  Berle Mull, MD [facility] Pager: [phone] 07/31/2018 12:50 PM

## 2018-07-31 NOTE — Addendum Note (Signed)
Addended by: Amado Coe on: 07/31/2018 03:58 PM   Modules accepted: Orders

## 2018-08-01 ENCOUNTER — Encounter (HOSPITAL_COMMUNITY): Admission: RE | Admit: 2018-08-01 | Payer: Self-pay | Source: Ambulatory Visit

## 2018-08-03 ENCOUNTER — Encounter (HOSPITAL_COMMUNITY): Payer: Self-pay

## 2018-08-03 DIAGNOSIS — J9621 Acute and chronic respiratory failure with hypoxia: Secondary | ICD-10-CM | POA: Diagnosis not present

## 2018-08-03 DIAGNOSIS — J439 Emphysema, unspecified: Secondary | ICD-10-CM | POA: Diagnosis not present

## 2018-08-04 DIAGNOSIS — F329 Major depressive disorder, single episode, unspecified: Secondary | ICD-10-CM | POA: Diagnosis not present

## 2018-08-04 DIAGNOSIS — F4323 Adjustment disorder with mixed anxiety and depressed mood: Secondary | ICD-10-CM | POA: Diagnosis not present

## 2018-08-07 ENCOUNTER — Ambulatory Visit (INDEPENDENT_AMBULATORY_CARE_PROVIDER_SITE_OTHER): Payer: Medicare Other | Admitting: Adult Health

## 2018-08-07 ENCOUNTER — Encounter: Payer: Self-pay | Admitting: Adult Health

## 2018-08-07 DIAGNOSIS — J9611 Chronic respiratory failure with hypoxia: Secondary | ICD-10-CM

## 2018-08-07 DIAGNOSIS — J441 Chronic obstructive pulmonary disease with (acute) exacerbation: Secondary | ICD-10-CM | POA: Diagnosis not present

## 2018-08-07 MED ORDER — FIRST-DUKES MOUTHWASH MT SUSP: OROMUCOSAL | 0 refills | Status: DC

## 2018-08-07 MED ORDER — BUDESONIDE 0.5 MG/2ML IN SUSP: 0.5000 mg | Freq: Two times a day (BID) | RESPIRATORY_TRACT | 5 refills | Status: DC

## 2018-08-07 MED ORDER — ARFORMOTEROL TARTRATE 15 MCG/2ML IN NEBU: 15.0000 ug | INHALATION_SOLUTION | Freq: Two times a day (BID) | RESPIRATORY_TRACT | 5 refills | Status: DC

## 2018-08-07 MED ORDER — IPRATROPIUM-ALBUTEROL 0.5-2.5 (3) MG/3ML IN SOLN: 3.0000 mL | Freq: Three times a day (TID) | RESPIRATORY_TRACT | 5 refills | Status: DC

## 2018-08-07 NOTE — Patient Instructions (Addendum)
Change Oxygen 4l/m rest and 6l/m with activity , goal if for Oxygen level to be 90-92%  Continue on Brovana and Budeonside Neb Twice daily   Continue on  Duoneb three -four times a day  .  Taper prednisone as directed.  Mucinex DM Twice daily  As needed  Cough /congestion  Follow up in .Dr. Lamonte Sakai  In 4 weeks and As needed   Please contact office for sooner follow up if symptoms do not improve or worsen or seek emergency care

## 2018-08-07 NOTE — Assessment & Plan Note (Addendum)
Recent exacerbation now improving Patient is high risk for decompensation, will discuss in further detail possible TRILOGY vent at next office visit  EOL goals of care discussed. Living will   Plan  Patient Instructions  Change Oxygen 4l/m rest and 6l/m with activity , goal if for Oxygen level to be 90-92%  Continue on Brovana and Budeonside Neb Twice daily   Continue on  Duoneb three -four times a day  .  Taper prednisone as directed.  Mucinex DM Twice daily  As needed  Cough /congestion  Follow up in .Dr. Lamonte Sakai  In 4 weeks and As needed   Please contact office for sooner follow up if symptoms do not improve or worsen or seek emergency care      Late Add :  Pt returned call would like to begin TRILOGY Vent At bedtime    She has hypercarbic and hypoxic respiratory with tendency to COPD exacerbations and hospitalizations  Patient's chronic respiratory failure due to severe COPD that is life threatening.  Previous ABG's have documented high PCO2 and spirometry reveals severe obstructive ventilatory defect.  Patient would benefit from non-invasive ventilation.  Without this therapy, the patient is at high risk of ending up with worsening symptoms, worsened respiratory failure, need for ER visits and/or recurrent hospitalizations.  Bilevel device unable to adequately support patient's nocturnal ventilation needs.  Patient would benefit from NIV therapy with set tidal volumes and pressure.

## 2018-08-07 NOTE — Assessment & Plan Note (Signed)
Hypercarbic and hypoxemic respiratory failure.  Patient is continue on oxygen.  O2 goals are to keep sats between 90 to 92%. Continue on oxygen 4 L at rest 6 L with activity.

## 2018-08-07 NOTE — Addendum Note (Signed)
Addended by: Parke Poisson E on: 08/07/2018 03:03 PM   Modules accepted: Orders

## 2018-08-07 NOTE — Progress Notes (Signed)
_0  ID: Kayla Mack, female    DOB: 1946/03/04, 72 y.o.   MRN: [id number]  Chief Complaint  Patient presents with  . Follow-up    COPD     Referring provider: Jani Gravel, MD  HPI: 72 year old female former smoker quit June 2018 seen for pulmonary consult during hospitalization July 2018 for COPD exacerbation and PNA , Hypercarbic RF   TEST /Events  July 2018 hospitalization for severe COPD exacerbation with hypercarbic respiratory failure requiring BiPAP. CT chest with severe bullous emphysema, w/ mod. PAH  Echo 05/05/17  >LVEF 60-65%, no wall motion abnormalities. , PAP 54mHg R/LHC  05/09/17 >mild nonobstructive CAd, Mod Pulmonary HTN  05/2017 Spirometry today shows  FEV1 41%, ratio 43, FVC 73 Completed pulmonary rehab 2019 October 2019 echo EF 66 5%, grade 2 diastolic dysfunction moderate regurg mitral valve  08/07/2018 Follow [facility] : COPD , [facility]  [facility]  [facility] returns for a follow-up visit.Patient has known severe COPD with hypoxic and hypercarbic respiratory failure.  She has severe bullous emphysema on CT chest.     Patient was recently hospitalized for COPD exacerbation, acute encephalopathy secondary to hypoxemia.  Prior to admission patient was at church and oxygen ran out.  Patient was brought to the emergency room with O2 saturations in the 70s she required initial BiPAP support.  She did have some hypotension and lactic acidosis that responded to fluid resuscitation.   pH on admission was 7.004, PCO2 86 .she was treated with antibiotics and steroids.  Patient improved.  Did require increased oxygen levels at discharge 6 L at rest and 2 L with activity.  Previously was on 4 L at rest and 6 L with activity.  Since discharge patient is feeling better.  Says that she is starting to return to baseline.  Patient is on current steroid taper.  She was changed from her inhalers to Brovana and budesonide.  Along with DuoNeb Three times a day  . Says they  are working good. Needs them sent to DME , not affordable at pharmacy .   Pt is going to lawyer and get healthcare POA / living will . Long discussion with pt regarding EOL decisions. She currently opts for a DNR status. She wants treatment of illness but would not want to intubated or on vent support . No CPR .   We discussed her potential for frequent COPD flares and severity when she has these aloing with severe decompensation . She has chronic hypercarbia which decompensates during her exacerbations.  She did improve with BiPAP support during most recent admission.  Discussed options such asTRILOGY Vent at bedtime going long-term.  She would like to think about this and discuss it on return.  She says she continues to be active.  She is involved in the maintenance program at pulmonary rehab   Influenza shot is up-to-date    Allergies  Allergen Reactions  . Aleve [Naproxen Sodium] Hives  . Aspirin Swelling    Facial swelling  . Penicillins Hives and Swelling    Has patient had a PCN reaction causing immediate rash, facial/tongue/throat swelling, SOB or lightheadedness with hypotension: Yes Has patient had a PCN reaction causing severe rash involving mucus membranes or skin necrosis: Yes Has patient had a PCN reaction that required hospitalization: Unk Has patient had a PCN reaction occurring within the last 10 years: No If all of the above answers are "NO", then may proceed with Cephalosporin use.  Immunization History  Administered Date(s) Administered  . Influenza, High Dose Seasonal PF 07/18/2016, 07/13/2017, 07/17/2018  . Influenza-Unspecified 07/18/2013  . Pneumococcal Conjugate-13 07/13/2017  . Pneumococcal Polysaccharide-23 07/18/2016    Past Medical History:  Diagnosis Date  . Anxiety   . COPD (chronic obstructive pulmonary disease) ([place])   . Depression   . Hyperlipidemia   . Hypertension     Tobacco History: Social History   Tobacco Use  Smoking Status  Former Smoker  . Packs/day: 1.50  . Years: 40.00  . Pack years: 60.00  . Types: Cigarettes  . Last attempt to quit: 03/18/2017  . Years since quitting: 1.3  Smokeless Tobacco Never Used   Counseling given: Not Answered   Outpatient Medications Prior to Visit  Medication Sig Dispense Refill  . acetaminophen (TYLENOL) 500 MG tablet Take 1,000 mg by mouth 2 (two) times daily as needed for pain.     [name] albuterol (PROVENTIL) (2.5 MG/3ML) 0.083% nebulizer solution Take 3 mLs (2.5 mg total) by nebulization every 4 (four) hours as needed for wheezing or shortness of breath. 75 mL 0  . arformoterol (BROVANA) 15 MCG/2ML NEBU Take 2 mLs (15 mcg total) by nebulization 2 (two) times daily for 7 days. 28 mL 0  . bisoprolol (ZEBETA) 5 MG tablet Take 1 tablet (5 mg total) by mouth daily. 30 tablet 0  . budesonide (PULMICORT) 0.5 MG/2ML nebulizer solution Take 2 mLs (0.5 mg total) by nebulization 2 (two) times daily for 7 days. 28 mL 0  . calcium-vitamin D (OSCAL WITH D) 500-200 MG-UNIT per tablet Take 1 tablet by mouth 2 (two) times daily. (Patient taking differently: Take 1 tablet by mouth daily. )    . clonazePAM (KLONOPIN) 0.5 MG tablet Take 1 tablet (0.5 mg total) by mouth 2 (two) times daily as needed for anxiety. (Patient taking differently: Take 0.5 mg by mouth 3 times/day as needed-between meals & bedtime for anxiety (sleep). ) 10 tablet 0  . clopidogrel (PLAVIX) 75 MG tablet Take 1 tablet (75 mg total) by mouth daily. 30 tablet 0  . diphenhydrAMINE (BENADRYL) 25 mg capsule Take 25 mg by mouth every 6 (six) hours as needed for allergies.     . fluticasone (FLONASE) 50 MCG/ACT nasal spray Place 2 sprays into both nostrils daily. 16 g 0  . guaiFENesin (MUCINEX) 600 MG 12 hr tablet Take 1 tablet (600 mg total) by mouth 2 (two) times daily. 30 tablet 0  . ipratropium-albuterol (DUONEB) 0.5-2.5 (3) MG/3ML SOLN Take 3 mLs by nebulization 3 (three) times daily. 360 mL 0  . loratadine (CLARITIN) 10 MG tablet  Take 1 tablet (10 mg total) by mouth daily. 30 tablet 0  . lovastatin (MEVACOR) 40 MG tablet Take 40 mg by mouth every morning.    [name] PARoxetine (PAXIL) 20 MG tablet Take 20 mg by mouth daily.    . predniSONE (DELTASONE) 10 MG tablet Take 7m daily for 3days,Take 463mdaily for 3days,Take 3056maily for 3days,Take 23m69mily for 3days,Take 10mg67mly for 3days, then stop 45 tablet 0  . sodium chloride (OCEAN) 0.65 % SOLN nasal spray Place 1 spray into both nostrils as needed for congestion.    . VENTOLIN HFA 108 (90 Base) MCG/ACT inhaler Inhale 2 puffs into the lungs every 6 (six) hours as needed for wheezing or shortness of breath.     . Fluticasone-Umeclidin-Vilant (TRELEGY ELLIPTA) 100-62.5-25 MCG/INH AEPB Inhale 1 puff into the lungs daily. (Patient not taking: Reported on 08/07/2018) 1 each 5  No facility-administered medications prior to visit.      Review of Systems  Constitutional:   No  weight loss, night sweats,  Fevers, chills,  +fatigue, or  lassitude.  HEENT:   No headaches,  Difficulty swallowing,  Tooth/dental problems, or  Sore throat,                No sneezing, itching, ear ache, nasal congestion, post nasal drip,   CV:  No chest pain,  Orthopnea, PND, swelling in lower extremities, anasarca, dizziness, palpitations, syncope.   GI  No heartburn, indigestion, abdominal pain, nausea, vomiting, diarrhea, change in bowel habits, loss of appetite, bloody stools.   Resp:   No chest wall deformity  Skin: no rash or lesions.  GU: no dysuria, change in color of urine, no urgency or frequency.  No flank pain, no hematuria   MS:  No joint pain or swelling.  No decreased range of motion.  No back pain.    Physical Exam  BP 130/80 (BP Location: Right Arm, Cuff Size: Normal)   Pulse 92   Ht 5' 1.75" (1.568 m)   Wt 137 lb (62.1 kg)   SpO2 95%   BMI 25.26 kg/m   GEN: A/Ox3; pleasant , NAD, elderly, on oxygen   HEENT:  [place]/AT,  EACs-clear, TMs-wnl, NOSE-clear,  THROAT-clear, no lesions, no postnasal drip or exudate noted.   NECK:  Supple w/ fair ROM; no JVD; normal carotid impulses w/o bruits; no thyromegaly or nodules palpated; no lymphadenopathy.    RESP decreased breath sounds in the bases . no accessory muscle use, no dullness to percussion  CARD:  RRR, no m/r/g, no peripheral edema, pulses intact, no cyanosis or clubbing.  GI:   Soft & nt; nml bowel sounds; no organomegaly or masses detected.   Musco: Warm bil, no deformities or joint swelling noted.   Neuro: alert, no focal deficits noted.    Skin: Warm, no lesions or rashes    Lab Results:  CBC BMET   ProBNP No results found for: PROBNP  Imaging: Dg Chest Portable 1 View  Result Date: 07/26/2018 CLINICAL DATA:  Respiratory distress, history of COPD, trauma. EXAM: PORTABLE CHEST 1 VIEW COMPARISON:  Portable chest x-ray of April 02, 2018 FINDINGS: The lungs remain hyperinflated. The interstitial markings remain increased in the mid and lower lungs bilaterally. Bullous lesions in the apices bilaterally are stable. The heart and pulmonary vascularity are normal. There is calcification in the wall of the aortic arch. There is no pleural effusion or pneumothorax. IMPRESSION: Bullous emphysema. Increased interstitial markings in the mid and lower lungs is chronic but superimposed acute bronchitis may be present. No CHF. Thoracic aortic atherosclerosis. Electronically Signed   By: David  Martinique M.D.   On: 07/26/2018 13:03     Assessment & Plan:   COPD (chronic obstructive pulmonary disease) ([place]) Recent exacerbation now improving Patient is high risk for decompensation, will discuss in further detail possible TRILOGY vent at next office visit  EOL goals of care discussed. Living will   Plan  Patient Instructions  Change Oxygen 4l/m rest and 6l/m with activity , goal if for Oxygen level to be 90-92%  Continue on Brovana and Budeonside Neb Twice daily   Continue on  Duoneb three  -four times a day  .  Taper prednisone as directed.  Mucinex DM Twice daily  As needed  Cough /congestion  Follow up in .Dr. Lamonte Sakai  In 4 weeks and As needed   Please contact office  for sooner follow up if symptoms do not improve or worsen or seek emergency care      Chronic respiratory failure with hypoxia ([place]) Hypercarbic and hypoxemic respiratory failure.  Patient is continue on oxygen.  O2 goals are to keep sats between 90 to 92%. Continue on oxygen 4 L at rest 6 L with activity.     Rexene Edison, NP 08/07/2018

## 2018-08-08 ENCOUNTER — Encounter (HOSPITAL_COMMUNITY): Payer: Self-pay

## 2018-08-08 ENCOUNTER — Telehealth: Payer: Self-pay | Admitting: Emergency Medicine

## 2018-08-08 DIAGNOSIS — J441 Chronic obstructive pulmonary disease with (acute) exacerbation: Secondary | ICD-10-CM

## 2018-08-08 DIAGNOSIS — J9621 Acute and chronic respiratory failure with hypoxia: Secondary | ICD-10-CM | POA: Diagnosis not present

## 2018-08-08 DIAGNOSIS — J439 Emphysema, unspecified: Secondary | ICD-10-CM | POA: Diagnosis not present

## 2018-08-08 MED ORDER — IPRATROPIUM-ALBUTEROL 0.5-2.5 (3) MG/3ML IN SOLN: 3.0000 mL | Freq: Three times a day (TID) | RESPIRATORY_TRACT | 5 refills | Status: DC

## 2018-08-08 NOTE — Telephone Encounter (Signed)
Called [facility] and spoke with Cl[place]. Per Estill Bamberg, the duoneb neb sol can only be used as a rescue method due to pt obtaining the brovana and budesonide. Per Estill Bamberg, the brovana and budesonide neb sol shipped yesterday, 10/21  Estill Bamberg wants to know if we wanted to change the instructions for the duoneb to make it for a rescue method or if we wanted to send the rx to a different pharmacy instead of [facility]'s pharmacy.  The instructions currently for pt's duoneb neb sol states for pt to take 72ms by nebulization 3 times daily.  Called and spoke with pt letting her know the above information. Pt expressed understanding and stated to send the duoneb to [facility] [place] I have sent the rx to [facility] per pt's request. Nothing further needed.

## 2018-08-09 ENCOUNTER — Telehealth: Payer: Self-pay | Admitting: Emergency Medicine

## 2018-08-09 DIAGNOSIS — J9602 Acute respiratory failure with hypercapnia: Secondary | ICD-10-CM

## 2018-08-09 DIAGNOSIS — J9611 Chronic respiratory failure with hypoxia: Secondary | ICD-10-CM

## 2018-08-09 DIAGNOSIS — J441 Chronic obstructive pulmonary disease with (acute) exacerbation: Secondary | ICD-10-CM

## 2018-08-09 MED ORDER — IPRATROPIUM-ALBUTEROL 0.5-2.5 (3) MG/3ML IN SOLN: 3.0000 mL | Freq: Every day | RESPIRATORY_TRACT | 5 refills | Status: DC

## 2018-08-09 NOTE — Telephone Encounter (Signed)
Called and spoke with pt who stated she decided she wants to go ahead with the non invasive vent machine that was discussed with her by TP at her last OV.  Tammy, please advise on this for pt. Thanks!

## 2018-08-09 NOTE — Telephone Encounter (Signed)
Spoke with pt, she stated she wanted to clarify if she is supposed to be taking the budesonide and brovana daily and duoneb as needed. I spoke with pharmacist earlier and they stated that Medicare will not pay for duoneb 3 times a day so I had to send in the Rx to take 4ms daily. TP is it ok for pt to take duoneb as needed? If this is ok, we do not have to call pt back.    Patient Instructions by PMelvenia Needles NP at 08/07/2018 11:00 AM  Author: PMelvenia Needles NP Author Type: Nurse Practitioner Filed: 08/07/2018 12:06 PM  Note Status: Addendum Cosign: Cosign Not Required Encounter Date: 08/07/2018  Editor: PMelvenia Needles NP (Nurse Practitioner)  Prior Versions: 1. Parrett, TFonnie Mu NP (Nurse Practitioner) at 08/07/2018 12:02 PM - Signed    Change Oxygen 4l/m rest and 6l/m with activity , goal if for Oxygen level to be 90-92%  Continue on Brovana and Budeonside Neb Twice daily   Continue on  Duoneb three -four times a day  .  Taper prednisone as directed.  Mucinex DM Twice daily  As needed  Cough /congestion  Follow up in .Dr. BLamonte Sakai In 4 weeks and As needed   Please contact office for sooner follow up if symptoms do not improve or worsen or seek emergency care

## 2018-08-09 NOTE — Telephone Encounter (Signed)
Spoke with pharmacist and he stated that the Duoneb had to be changed to one time daily because pt already had Brovana and Pulmicort. And there are duplicate therapies with those combinations. We must send in a RX for Duoneb to take once daily because Medicare will not cover more than that. I sent in a RX for what they will cover. FYI RB

## 2018-08-09 NOTE — Telephone Encounter (Signed)
Patient called back stating she would like TP place an order for a non-invasive machine to use at night. Pt contact # [phone]

## 2018-08-10 ENCOUNTER — Other Ambulatory Visit: Payer: Self-pay | Admitting: *Deleted

## 2018-08-10 ENCOUNTER — Encounter (HOSPITAL_COMMUNITY): Payer: Self-pay

## 2018-08-10 DIAGNOSIS — J9611 Chronic respiratory failure with hypoxia: Secondary | ICD-10-CM

## 2018-08-10 DIAGNOSIS — J9602 Acute respiratory failure with hypercapnia: Secondary | ICD-10-CM

## 2018-08-10 DIAGNOSIS — J441 Chronic obstructive pulmonary disease with (acute) exacerbation: Secondary | ICD-10-CM

## 2018-08-10 NOTE — Telephone Encounter (Signed)
Order has been placed for patient's trilogy vent. Nothing further needed.

## 2018-08-10 NOTE — Telephone Encounter (Signed)
Please order TRILOGY Vent At bedtime  For pt  Dx COPD  , Emphysema ,severe  Hypercarbic Respirtory Failure . , COPD exacerbation   Keep planned follow up in 2-3 weeks as planned.  Please contact office for sooner follow up if symptoms do not improve or worsen or seek emergency care

## 2018-08-14 DIAGNOSIS — F329 Major depressive disorder, single episode, unspecified: Secondary | ICD-10-CM | POA: Diagnosis not present

## 2018-08-14 DIAGNOSIS — F4323 Adjustment disorder with mixed anxiety and depressed mood: Secondary | ICD-10-CM | POA: Diagnosis not present

## 2018-08-15 ENCOUNTER — Inpatient Hospital Stay (HOSPITAL_COMMUNITY)
Admission: EM | Admit: 2018-08-15 | Discharge: 2018-08-19 | DRG: 190 | Disposition: A | Discharge status: Home Health | Payer: Medicare Other | Attending: Family Medicine | Admitting: Family Medicine

## 2018-08-15 ENCOUNTER — Encounter: Payer: Self-pay | Admitting: Adult Health

## 2018-08-15 ENCOUNTER — Ambulatory Visit (INDEPENDENT_AMBULATORY_CARE_PROVIDER_SITE_OTHER): Payer: Medicare Other | Admitting: Adult Health

## 2018-08-15 ENCOUNTER — Encounter (HOSPITAL_COMMUNITY): Payer: Self-pay

## 2018-08-15 ENCOUNTER — Emergency Department (HOSPITAL_COMMUNITY): Payer: Medicare Other

## 2018-08-15 ENCOUNTER — Encounter (HOSPITAL_COMMUNITY): Payer: Self-pay | Admitting: *Deleted

## 2018-08-15 DIAGNOSIS — E785 Hyperlipidemia, unspecified: Secondary | ICD-10-CM | POA: Diagnosis present

## 2018-08-15 DIAGNOSIS — Z886 Allergy status to analgesic agent status: Secondary | ICD-10-CM

## 2018-08-15 DIAGNOSIS — J9621 Acute and chronic respiratory failure with hypoxia: Secondary | ICD-10-CM | POA: Diagnosis not present

## 2018-08-15 DIAGNOSIS — Z8249 Family history of ischemic heart disease and other diseases of the circulatory system: Secondary | ICD-10-CM

## 2018-08-15 DIAGNOSIS — Z7902 Long term (current) use of antithrombotics/antiplatelets: Secondary | ICD-10-CM

## 2018-08-15 DIAGNOSIS — I272 Pulmonary hypertension, unspecified: Secondary | ICD-10-CM | POA: Diagnosis not present

## 2018-08-15 DIAGNOSIS — J441 Chronic obstructive pulmonary disease with (acute) exacerbation: Secondary | ICD-10-CM | POA: Diagnosis present

## 2018-08-15 DIAGNOSIS — R062 Wheezing: Secondary | ICD-10-CM | POA: Diagnosis not present

## 2018-08-15 DIAGNOSIS — J439 Emphysema, unspecified: Secondary | ICD-10-CM | POA: Diagnosis not present

## 2018-08-15 DIAGNOSIS — I10 Essential (primary) hypertension: Secondary | ICD-10-CM | POA: Diagnosis not present

## 2018-08-15 DIAGNOSIS — K219 Gastro-esophageal reflux disease without esophagitis: Secondary | ICD-10-CM | POA: Diagnosis not present

## 2018-08-15 DIAGNOSIS — J9611 Chronic respiratory failure with hypoxia: Secondary | ICD-10-CM

## 2018-08-15 DIAGNOSIS — Z9981 Dependence on supplemental oxygen: Secondary | ICD-10-CM

## 2018-08-15 DIAGNOSIS — Z87891 Personal history of nicotine dependence: Secondary | ICD-10-CM

## 2018-08-15 DIAGNOSIS — Z880 Allergy status to penicillin: Secondary | ICD-10-CM

## 2018-08-15 DIAGNOSIS — Z7951 Long term (current) use of inhaled steroids: Secondary | ICD-10-CM

## 2018-08-15 DIAGNOSIS — R0602 Shortness of breath: Secondary | ICD-10-CM | POA: Diagnosis not present

## 2018-08-15 DIAGNOSIS — F418 Other specified anxiety disorders: Secondary | ICD-10-CM | POA: Diagnosis not present

## 2018-08-15 DIAGNOSIS — Z79899 Other long term (current) drug therapy: Secondary | ICD-10-CM

## 2018-08-15 LAB — I-STAT TROPONIN, ED: Troponin i, poc: 0 ng/mL (ref 0.00–0.08)

## 2018-08-15 LAB — CBC WITH DIFFERENTIAL/PLATELET
Abs Immature Granulocytes: 0.03 10*3/uL (ref 0.00–0.07)
Basophils Absolute: 0.1 10*3/uL (ref 0.0–0.1)
Basophils Relative: 1 %
Eosinophils Absolute: 0.4 10*3/uL (ref 0.0–0.5)
Eosinophils Relative: 4 %
HCT: 35.1 % — ABNORMAL LOW (ref 36.0–46.0)
Hemoglobin: 10.7 g/dL — ABNORMAL LOW (ref 12.0–15.0)
Immature Granulocytes: 0 %
Lymphocytes Relative: 13 %
Lymphs Abs: 1.2 10*3/uL (ref 0.7–4.0)
MCH: 29.3 pg (ref 26.0–34.0)
MCHC: 30.5 g/dL (ref 30.0–36.0)
MCV: 96.2 fL (ref 80.0–100.0)
Monocytes Absolute: 0.4 10*3/uL (ref 0.1–1.0)
Monocytes Relative: 4 %
Neutro Abs: 7.1 10*3/uL (ref 1.7–7.7)
Neutrophils Relative %: 78 %
Platelets: 297 10*3/uL (ref 150–400)
RBC: 3.65 MIL/uL — ABNORMAL LOW (ref 3.87–5.11)
RDW: 13.8 % (ref 11.5–15.5)
WBC: 9.2 10*3/uL (ref 4.0–10.5)
nRBC: 0 % (ref 0.0–0.2)

## 2018-08-15 LAB — URINALYSIS, ROUTINE W REFLEX MICROSCOPIC
Bilirubin Urine: NEGATIVE
Glucose, UA: NEGATIVE mg/dL
Hgb urine dipstick: NEGATIVE
Ketones, ur: NEGATIVE mg/dL
Nitrite: NEGATIVE
Protein, ur: NEGATIVE mg/dL
Specific Gravity, Urine: 1.012 (ref 1.005–1.030)
pH: 6 (ref 5.0–8.0)

## 2018-08-15 LAB — BLOOD GAS, VENOUS
Acid-Base Excess: 3.7 mmol/L — ABNORMAL HIGH (ref 0.0–2.0)
Bicarbonate: 29 mmol/L — ABNORMAL HIGH (ref 20.0–28.0)
O2 Saturation: 97.9 %
Patient temperature: 98.6
pCO2, Ven: 50 mmHg (ref 44.0–60.0)
pH, Ven: 7.382 (ref 7.250–7.430)
pO2, Ven: 108 mmHg — ABNORMAL HIGH (ref 32.0–45.0)

## 2018-08-15 LAB — COMPREHENSIVE METABOLIC PANEL
ALT: 17 U/L (ref 0–44)
AST: 25 U/L (ref 15–41)
Albumin: 3.7 g/dL (ref 3.5–5.0)
Alkaline Phosphatase: 63 U/L (ref 38–126)
Anion gap: 9 (ref 5–15)
BUN: 21 mg/dL (ref 8–23)
CO2: 26 mmol/L (ref 22–32)
Calcium: 9.2 mg/dL (ref 8.9–10.3)
Chloride: 103 mmol/L (ref 98–111)
Creatinine, Ser: 0.79 mg/dL (ref 0.44–1.00)
GFR calc Af Amer: >60 mL/min (ref >60)
GFR calc non Af Amer: >60 mL/min (ref >60)
Glucose, Bld: 108 mg/dL — ABNORMAL HIGH (ref 70–99)
Potassium: 4.5 mmol/L (ref 3.5–5.1)
Sodium: 138 mmol/L (ref 135–145)
Total Bilirubin: 1 mg/dL (ref 0.3–1.2)
Total Protein: 6.6 g/dL (ref 6.5–8.1)

## 2018-08-15 LAB — I-STAT CG4 LACTIC ACID, ED: Lactic Acid, Venous: 1.46 mmol/L (ref 0.5–1.9)

## 2018-08-15 MED ORDER — ZOLPIDEM TARTRATE 5 MG PO TABS: 5.0000 mg | ORAL_TABLET | Freq: Every evening | ORAL | Status: DC | PRN

## 2018-08-15 MED ORDER — IPRATROPIUM-ALBUTEROL 0.5-2.5 (3) MG/3ML IN SOLN
3.0000 mL | RESPIRATORY_TRACT | Status: DC
  Administered 2018-08-15: 3 mL via RESPIRATORY_TRACT
  Filled 2018-08-15: qty 3

## 2018-08-15 MED ORDER — ARFORMOTEROL TARTRATE 15 MCG/2ML IN NEBU
15.0000 ug | INHALATION_SOLUTION | Freq: Two times a day (BID) | RESPIRATORY_TRACT | Status: DC
  Administered 2018-08-16 – 2018-08-19 (×7): 15 ug via RESPIRATORY_TRACT
  Filled 2018-08-15 (×7): qty 2

## 2018-08-15 MED ORDER — PANTOPRAZOLE SODIUM 40 MG PO TBEC
40.0000 mg | DELAYED_RELEASE_TABLET | Freq: Every day | ORAL | Status: DC
  Administered 2018-08-16 – 2018-08-18 (×4): 40 mg via ORAL
  Filled 2018-08-15 (×4): qty 1

## 2018-08-15 MED ORDER — DIPHENHYDRAMINE HCL 25 MG PO CAPS: 25.0000 mg | ORAL_CAPSULE | Freq: Four times a day (QID) | ORAL | Status: DC | PRN

## 2018-08-15 MED ORDER — FLUTICASONE PROPIONATE 50 MCG/ACT NA SUSP
2.0000 | Freq: Every day | NASAL | Status: DC
  Administered 2018-08-16 – 2018-08-19 (×4): 2 via NASAL
  Filled 2018-08-15: qty 16

## 2018-08-15 MED ORDER — METHYLPREDNISOLONE SODIUM SUCC 125 MG IJ SOLR
60.0000 mg | Freq: Three times a day (TID) | INTRAMUSCULAR | Status: DC
  Administered 2018-08-16 (×2): 60 mg via INTRAVENOUS
  Filled 2018-08-15 (×2): qty 2

## 2018-08-15 MED ORDER — IPRATROPIUM-ALBUTEROL 0.5-2.5 (3) MG/3ML IN SOLN
3.0000 mL | Freq: Four times a day (QID) | RESPIRATORY_TRACT | Status: DC
  Administered 2018-08-16 – 2018-08-18 (×12): 3 mL via RESPIRATORY_TRACT
  Filled 2018-08-15 (×12): qty 3

## 2018-08-15 MED ORDER — LORATADINE 10 MG PO TABS: 10.0000 mg | ORAL_TABLET | Freq: Every day | ORAL | Status: DC | PRN

## 2018-08-15 MED ORDER — BISOPROLOL FUMARATE 5 MG PO TABS
5.0000 mg | ORAL_TABLET | Freq: Every day | ORAL | Status: DC
  Administered 2018-08-16 – 2018-08-19 (×4): 5 mg via ORAL
  Filled 2018-08-15 (×4): qty 1

## 2018-08-15 MED ORDER — ALUM & MAG HYDROXIDE-SIMETH 200-200-20 MG/5ML PO SUSP: 30.0000 mL | Freq: Four times a day (QID) | ORAL | Status: DC | PRN

## 2018-08-15 MED ORDER — METHYLPREDNISOLONE SODIUM SUCC 125 MG IJ SOLR
125.0000 mg | Freq: Once | INTRAMUSCULAR | Status: AC
  Administered 2018-08-15: 125 mg via INTRAVENOUS
  Filled 2018-08-15: qty 2

## 2018-08-15 MED ORDER — ENOXAPARIN SODIUM 40 MG/0.4ML ~~LOC~~ SOLN
40.0000 mg | SUBCUTANEOUS | Status: DC
  Administered 2018-08-16 – 2018-08-19 (×4): 40 mg via SUBCUTANEOUS
  Filled 2018-08-15 (×4): qty 0.4

## 2018-08-15 MED ORDER — ONDANSETRON HCL 4 MG/2ML IJ SOLN: 4.0000 mg | Freq: Three times a day (TID) | INTRAMUSCULAR | Status: DC | PRN

## 2018-08-15 MED ORDER — GUAIFENESIN ER 600 MG PO TB12
600.0000 mg | ORAL_TABLET | Freq: Two times a day (BID) | ORAL | Status: DC
  Administered 2018-08-16 – 2018-08-19 (×8): 600 mg via ORAL
  Filled 2018-08-15 (×8): qty 1

## 2018-08-15 MED ORDER — SODIUM CHLORIDE 0.9 % IV BOLUS
1000.0000 mL | Freq: Once | INTRAVENOUS | Status: AC
  Administered 2018-08-15: 1000 mL via INTRAVENOUS

## 2018-08-15 MED ORDER — HYDRALAZINE HCL 20 MG/ML IJ SOLN: 5.0000 mg | INTRAMUSCULAR | Status: DC | PRN

## 2018-08-15 MED ORDER — PRAVASTATIN SODIUM 40 MG PO TABS
40.0000 mg | ORAL_TABLET | Freq: Every day | ORAL | Status: DC
  Administered 2018-08-16 – 2018-08-18 (×3): 40 mg via ORAL
  Filled 2018-08-15 (×3): qty 1

## 2018-08-15 MED ORDER — ALBUTEROL (5 MG/ML) CONTINUOUS INHALATION SOLN
15.0000 mg/h | INHALATION_SOLUTION | Freq: Once | RESPIRATORY_TRACT | Status: AC
  Administered 2018-08-15: 15 mg/h via RESPIRATORY_TRACT
  Filled 2018-08-15: qty 20

## 2018-08-15 MED ORDER — ALBUTEROL SULFATE (2.5 MG/3ML) 0.083% IN NEBU: 2.5000 mg | INHALATION_SOLUTION | RESPIRATORY_TRACT | Status: DC | PRN

## 2018-08-15 MED ORDER — CLOPIDOGREL BISULFATE 75 MG PO TABS
75.0000 mg | ORAL_TABLET | Freq: Every day | ORAL | Status: DC
  Administered 2018-08-16 – 2018-08-19 (×4): 75 mg via ORAL
  Filled 2018-08-15 (×4): qty 1

## 2018-08-15 MED ORDER — DOXYCYCLINE HYCLATE 100 MG PO TABS
100.0000 mg | ORAL_TABLET | Freq: Two times a day (BID) | ORAL | Status: DC
  Administered 2018-08-16 – 2018-08-19 (×8): 100 mg via ORAL
  Filled 2018-08-15 (×8): qty 1

## 2018-08-15 MED ORDER — CLONAZEPAM 0.5 MG PO TABS
0.5000 mg | ORAL_TABLET | Freq: Two times a day (BID) | ORAL | Status: DC | PRN
  Administered 2018-08-17 – 2018-08-18 (×3): 0.5 mg via ORAL
  Filled 2018-08-15 (×3): qty 1

## 2018-08-15 MED ORDER — SALINE SPRAY 0.65 % NA SOLN
1.0000 | NASAL | Status: DC | PRN
  Filled 2018-08-15: qty 44

## 2018-08-15 MED ORDER — IPRATROPIUM BROMIDE 0.02 % IN SOLN
0.5000 mg | Freq: Once | RESPIRATORY_TRACT | Status: AC
  Administered 2018-08-15: 0.5 mg via RESPIRATORY_TRACT
  Filled 2018-08-15: qty 2.5

## 2018-08-15 MED ORDER — PAROXETINE HCL 20 MG PO TABS
20.0000 mg | ORAL_TABLET | Freq: Every day | ORAL | Status: DC
  Administered 2018-08-16 – 2018-08-19 (×4): 20 mg via ORAL
  Filled 2018-08-15 (×4): qty 1

## 2018-08-15 MED ORDER — MAGNESIUM SULFATE 2 GM/50ML IV SOLN
2.0000 g | Freq: Once | INTRAVENOUS | Status: AC
  Administered 2018-08-15: 2 g via INTRAVENOUS
  Filled 2018-08-15: qty 50

## 2018-08-15 MED ORDER — ACETAMINOPHEN 325 MG PO TABS
650.0000 mg | ORAL_TABLET | Freq: Four times a day (QID) | ORAL | Status: DC | PRN
  Administered 2018-08-16 (×2): 650 mg via ORAL
  Filled 2018-08-15 (×2): qty 2

## 2018-08-15 MED ORDER — CALCIUM CARBONATE-VITAMIN D 500-200 MG-UNIT PO TABS
1.0000 | ORAL_TABLET | Freq: Every day | ORAL | Status: DC
  Administered 2018-08-16 – 2018-08-19 (×4): 1 via ORAL
  Filled 2018-08-15 (×4): qty 1

## 2018-08-15 NOTE — Assessment & Plan Note (Addendum)
Acute on chronic hypoxic respiratory failure .  Report to EMS   Plan  Adjust O2 sats for O2 88-92%.   EOL discussion -pt is partial code - no intubation /  Okay for CPR and BIPAP .

## 2018-08-15 NOTE — ED Notes (Signed)
Attempt report x1

## 2018-08-15 NOTE — Progress Notes (Signed)
_0  ID: Kayla Mack, female    DOB: 1946-01-26, 72 y.o.   MRN: [id number]  Chief Complaint  Patient presents with  . Acute Visit    COPD     Referring provider: Jani Gravel, MD  HPI: 72 year old female former smoker quit June 2018 seen for pulmonary consult during hospitalization July 2018 for COPD exacerbationand PNA , Hypercarbic RF   TEST /Events July 2018 hospitalization for severe COPD exacerbation with hypercarbic respiratory failure requiring BiPAP. CT chest with severe bullous emphysema, w/ mod. PAH Echo 05/05/17>LVEF 60-65%, no wall motion abnormalities., PAP 7mHg R/LHC 05/09/17 >mild nonobstructive CAd, Mod Pulmonary HTN  05/2017 Spirometry today showsFEV1 41%, ratio 43, FVC 73 Completed pulmonary rehab 2019 October 2019 echo EF 66 5%, grade 2 diastolic dysfunction moderate regurg mitral valve  08/15/2018 Acute OV : COPD  Patient presents for an acute office visit.  She complains over the last 3 days that she has had increased cough with thick mucus wheezing and shortness of breath.  O2 saturations have been dropping despite being on 6 L of oxygen.  On arrival today O2 saturation was 81% on 6 L of oxygen.  We confirmed that her oxygen tank was working appropriately.  Once rested O2 saturations were slowly increased to greater than 90% after xopenex neb.  She says she has had some low-grade fever and chills. Says she has been hurting in back and chest for last 3 days . Feels tight .  Got real weak try to walk at all today , could not catch breath .   Patient was hospitalized earlier this month for acute on chronic hypoxic and hypercarbic respiratory failure..   Patient has severe COPD.  She is on Brovana and budesonide nebulizers twice daily and DuoNeb nebulizer 3 times daily.   Allergies  Allergen Reactions  . Aleve [Naproxen Sodium] Hives  . Aspirin Swelling    Facial swelling  . Penicillins Hives and Swelling    Has patient had a PCN reaction  causing immediate rash, facial/tongue/throat swelling, SOB or lightheadedness with hypotension: Yes Has patient had a PCN reaction causing severe rash involving mucus membranes or skin necrosis: Yes Has patient had a PCN reaction that required hospitalization: Unk Has patient had a PCN reaction occurring within the last 10 years: No If all of the above answers are "NO", then may proceed with Cephalosporin use.     Immunization History  Administered Date(s) Administered  . Influenza, High Dose Seasonal PF 07/18/2016, 07/13/2017, 07/17/2018  . Influenza-Unspecified 07/18/2013  . Pneumococcal Conjugate-13 07/13/2017  . Pneumococcal Polysaccharide-23 07/18/2016    Past Medical History:  Diagnosis Date  . Anxiety   . COPD [place] disease) [place]   . Depression   . Hyperlipidemia   . Hypertension     Tobacco History: Social History   Tobacco Use  Smoking Status Former Smoker  . Packs/day: 1.50  . Years: 40.00  . Pack years: 60.00  . Types: Cigarettes  . Last attempt to quit: 03/18/2017  . Years since quitting: 1.4  Smokeless Tobacco Never Used   Counseling given: Not Answered   Outpatient Medications Prior to Visit  Medication Sig Dispense Refill  . acetaminophen (TYLENOL) 500 MG tablet Take 1,000 mg by mouth 2 (two) times daily as needed for pain.     .Marland Kitchenalbuterol (PROVENTIL) (2.5 MG/3ML) 0.083% nebulizer solution Take 3 mLs (2.5 mg total) by nebulization every 4 (four) hours as needed for wheezing or shortness of breath. 75 mL 0  .  arformoterol (BROVANA) 15 MCG/2ML NEBU Take 2 mLs (15 mcg total) by nebulization 2 (two) times daily. Dx: J44.9 120 mL 5  . bisoprolol (ZEBETA) 5 MG tablet Take 1 tablet (5 mg total) by mouth daily. 30 tablet 0  . budesonide (PULMICORT) 0.5 MG/2ML nebulizer solution Take 2 mLs (0.5 mg total) by nebulization 2 (two) times daily. Dx: J44.9 120 mL 5  . calcium-vitamin D (OSCAL WITH D) 500-200 MG-UNIT per tablet Take 1 tablet by  mouth 2 (two) times daily. (Patient taking differently: Take 1 tablet by mouth daily. )    . clonazePAM (KLONOPIN) 0.5 MG tablet Take 1 tablet (0.5 mg total) by mouth 2 (two) times daily as needed for anxiety. (Patient taking differently: Take 0.5 mg by mouth 3 times/day as needed-between meals & bedtime for anxiety (sleep). ) 10 tablet 0  . clopidogrel (PLAVIX) 75 MG tablet Take 1 tablet (75 mg total) by mouth daily. 30 tablet 0  . Diphenhyd-Hydrocort-Nystatin (FIRST-DUKES MOUTHWASH) SUSP Gargle and swallow 1 tsp four times daily 240 mL 0  . diphenhydrAMINE (BENADRYL) 25 mg capsule Take 25 mg by mouth every 6 (six) hours as needed for allergies.     . fluticasone (FLONASE) 50 MCG/ACT nasal spray Place 2 sprays into both nostrils daily. 16 g 0  . guaiFENesin (MUCINEX) 600 MG 12 hr tablet Take 1 tablet (600 mg total) by mouth 2 (two) times daily. 30 tablet 0  . ipratropium-albuterol (DUONEB) 0.5-2.5 (3) MG/3ML SOLN Take 3 mLs by nebulization daily. Dx: J44.9 90 mL 5  . loratadine (CLARITIN) 10 MG tablet Take 1 tablet (10 mg total) by mouth daily. 30 tablet 0  . lovastatin (MEVACOR) 40 MG tablet Take 40 mg by mouth every morning.    [name] PARoxetine (PAXIL) 20 MG tablet Take 20 mg by mouth daily.    . sodium chloride (OCEAN) 0.65 % SOLN nasal spray Place 1 spray into both nostrils as needed for congestion.    . VENTOLIN HFA 108 (90 Base) MCG/ACT inhaler Inhale 2 puffs into the lungs every 6 (six) hours as needed for wheezing or shortness of breath.     . predniSONE (DELTASONE) 10 MG tablet Take 55m daily for 3days,Take 413mdaily for 3days,Take 3086maily for 3days,Take 64m26mily for 3days,Take 10mg37mly for 3days, then stop (Patient not taking: Reported on 08/15/2018) 45 tablet 0   No facility-administered medications prior to visit.      Review of Systems  Constitutional:   No  weight loss, night sweats,  Fevers, chills, + fatigue, or  lassitude.  HEENT:   No headaches,  Difficulty swallowing,   Tooth/dental problems, or  Sore throat,                No sneezing, itching, ear ache, nasal congestion, post nasal drip,   CV:  No,  Orthopnea, PND, swelling in lower extremities, anasarca, dizziness, palpitations, syncope.   GI  No heartburn, indigestion, abdominal pain, nausea, vomiting, diarrhea, change in bowel habits, loss of appetite, bloody stools.   Resp:    No chest wall deformity  Skin: no rash or lesions.  GU: no dysuria, change in color of urine, no urgency or frequency.  No flank pain, no hematuria   MS:  No joint pain or swelling.  No decreased range of motion.  No back pain.    Physical Exam  BP 136/84 (BP Location: Left Arm, Cuff Size: Normal)   Pulse 86   Ht 5' 1.75" (1.568 m)  Wt 137 lb 3.2 oz (62.2 kg)   SpO2 96%   BMI 25.30 kg/m   GEN: A/Ox3; pleasant , NAD, frail and elderly on oxygen   HEENT:  [place]/AT,  EACs-clear, TMs-wnl, NOSE-clear, THROAT-clear, no lesions, no postnasal drip or exudate noted.   NECK:  Supple w/ fair ROM; no JVD; normal carotid impulses w/o bruits; no thyromegaly or nodules palpated; no lymphadenopathy.    RESP decreased breath sounds in the bases.  No wheezing noted  no accessory muscle use, no dullness to percussion  CARD:  RRR, no m/r/g, no peripheral edema, pulses intact, no cyanosis or clubbing.  GI:   Soft & nt; nml bowel sounds; no organomegaly or masses detected.   Musco: Warm bil, no deformities or joint swelling noted.   Neuro: alert, no focal deficits noted.    Skin: Warm, no lesions or rashes    Lab Results:  CBC    Component Value Date/Time   WBC 12.2 (H) 07/30/2018 0229   RBC 3.47 (L) 07/30/2018 0229   HGB 10.0 (L) 07/30/2018 0229   HCT 33.3 (L) 07/30/2018 0229   PLT 226 07/30/2018 0229   MCV 96.0 07/30/2018 0229   MCH 28.8 07/30/2018 0229   MCHC 30.0 07/30/2018 0229   RDW 13.5 07/30/2018 0229   LYMPHSABS 0.9 07/28/2018 0809   MONOABS 0.5 07/28/2018 0809   EOSABS 0.0 07/28/2018 0809   BASOSABS 0.0  07/28/2018 0809    BMET    Component Value Date/Time   NA 139 07/30/2018 0229   K 4.0 07/30/2018 0229   CL 105 07/30/2018 0229   CO2 25 07/30/2018 0229   GLUCOSE 96 07/30/2018 0229   BUN 23 07/30/2018 0229   CREATININE 0.73 07/30/2018 0229   CALCIUM 8.4 (L) 07/30/2018 0229   GFRNONAA >60 07/30/2018 0229   GFRAA >60 07/30/2018 0229    BNP    Component Value Date/Time   BNP 338.0 (H) 07/26/2018 1233    ProBNP No results found for: PROBNP  Imaging: Dg Chest Portable 1 View  Result Date: 07/26/2018 CLINICAL DATA:  Respiratory distress, history of COPD, trauma. EXAM: PORTABLE CHEST 1 VIEW COMPARISON:  Portable chest x-ray of April 02, 2018 FINDINGS: The lungs remain hyperinflated. The interstitial markings remain increased in the mid and lower lungs bilaterally. Bullous lesions in the apices bilaterally are stable. The heart and pulmonary vascularity are normal. There is calcification in the wall of the aortic arch. There is no pleural effusion or pneumothorax. IMPRESSION: Bullous emphysema. Increased interstitial markings in the mid and lower lungs is chronic but superimposed acute bronchitis may be present. No CHF. Thoracic aortic atherosclerosis. Electronically Signed   By: David  Martinique M.D.   On: 07/26/2018 13:03      PFT Results Latest Ref Rng & Units 07/21/2017  FVC-Pre L 2.19  FVC-Predicted Pre % 86  FVC-Post L 2.11  FVC-Predicted Post % 83  Pre FEV1/FVC % % 42  Post FEV1/FCV % % 42  FEV1-Pre L 0.92  FEV1-Predicted Pre % 48  DLCO UNC% % 24  DLCO COR %Predicted % 29  TLC L 5.32  TLC % Predicted % 115  RV % Predicted % 140    No results found for: NITRICOXIDE      Assessment & Plan:   COPD (chronic obstructive pulmonary disease) (H[facility]) Acute exacerbation - will need hospital admission  [facility] , waiting in ER .  Will need to transfer to ER via EMS for  further evaluation and probable admission  She was given xopenex neb in  office  She is in stable condition for transport.   Plan  Transfer to ER via EMS    Chronic respiratory failure with hypoxia (H[facility]) Acute on chronic hypoxic respiratory failure .   Plan  Adjust O2 sats for O2 88-92%.   EOL discussion -pt is partial code - no intubation /  Okay for CPR and BIPAP .      Rexene Edison, NP 08/15/2018

## 2018-08-15 NOTE — Patient Instructions (Signed)
Transport to ER

## 2018-08-15 NOTE — H&P (Signed)
History and Physical    [name] KDX:[id number] DOB: 1946-05-04 DOA: 08/15/2018  Referring MD/NP/PA:   PCP: Jani Gravel, MD   Patient coming from:  The patient is coming from home.  At baseline, pt is independent for most of ADL.  Chief Complaint: SOB and cough  HPI: Kayla Mack is a 72 y.o. female with medical history significant of chronic respiratory failure due to severe COPD, on 4 to 6 L nasal cannula oxygen at home, hypertension, hyperlipidemia, depression with anxiety, pulmonary hypertension, who presents with shortness of breath and cough.  Patient was recently hospitalized from 10/9-10/13 due to COPD exacerbation.  She has been doing fine after discharge until 3 days ago, when she started having worsening shortness of breath and cough.  She coughs up yellow-colored sputum, she had subjective fever and chills.  She states that she normally uses 4 to 6 L nasal cannula oxygen.  At the good time she only need 4 L nasal cannula oxygen, but in the past 2 days she has to increase oxygen to 6 L, and still has shortness of breath on 6L of oxygen. She did not measure body temperature.  Per EMS report, patient had temperature 101, but her temperature is 97.4 in ED.  Patient states that she has mild chest pain on coughing earlier, which has completely resolved.  Currently no chest pain.  Denies recent long distant traveling, no tenderness in the calf areas.  Patient states that she does not have nausea, vomiting, diarrhea or abdominal pain, but reports that she has acid reflux.  Denies symptoms of UTI or unilateral weakness.  Patient was seen by pulmonologist nurse practitioner today, and was treated with nebulizer without improvement.   ED Course: pt was found to have WBC 9.2, lactic acid of 1.46, negative troponin, electrolytes renal function okay, temperature 97.4, no tachycardia, has tachypnea, oxygen saturation 95% on 5 L nasal cannula oxygen in the ED, chest x-ray showed COPD  change, without infiltration.  Patient is placed on telemetry bed for observation.  Review of Systems:   General: had subjective fevers, chills, no body weight gain, has poor appetite, has fatigue HEENT: no blurry vision, hearing changes or sore throat Respiratory: has dyspnea, coughing, wheezing CV: no chest pain, no palpitations GI: no nausea, vomiting, abdominal pain, diarrhea, constipation. has acid reflux. GU: no dysuria, burning on urination, increased urinary frequency, hematuria  Ext: no leg edema Neuro: no unilateral weakness, numbness, or tingling, no vision change or hearing loss Skin: no rash, no skin tear. MSK: No muscle spasm, no deformity, no limitation of range of movement in spin Heme: No easy bruising.  Travel history: No recent long distant travel.  Allergy:  Allergies  Allergen Reactions  . Aleve [Naproxen Sodium] Hives  . Aspirin Swelling    Facial swelling  . Penicillins Hives and Swelling    Has patient had a PCN reaction causing immediate rash, facial/tongue/throat swelling, SOB or lightheadedness with hypotension: Yes Has patient had a PCN reaction causing severe rash involving mucus membranes or skin necrosis: Yes Has patient had a PCN reaction that required hospitalization: Unk Has patient had a PCN reaction occurring within the last 10 years: No If all of the above answers are "NO", then may proceed with Cephalosporin use.     Past Medical History:  Diagnosis Date  . Anxiety   . COPD (chronic obstructive pulmonary disease) ([place])   . Depression   . Hyperlipidemia   . Hypertension     Past  Surgical History:  Procedure Laterality Date  . APPENDECTOMY    . RIGHT/LEFT HEART CATH AND CORONARY ANGIOGRAPHY N/A 05/09/2017   Procedure: Right/Left Heart Cath and Coronary Angiography;  Surgeon: [name], MD;  Location: [facility];  Service: Cardiovascular;  Laterality: N/A;  . SHOULDER SURGERY Right Pin  . TONSILLECTOMY      Social  History:  reports that she quit smoking about 16 months ago. Her smoking use included cigarettes. She has a 60.00 pack-year smoking history. She has never used smokeless tobacco. She reports that she drinks alcohol. She reports that she does not use drugs.  Family History:  Family History  Problem Relation Age of Onset  . Heart disease Mother   . Heart failure Mother   . Heart disease Father        cabg  . Heart disease Sister   . Diabetes Sister      Prior to Admission medications   Medication Sig Start Date End Date Taking? Authorizing Provider  acetaminophen (TYLENOL) 500 MG tablet Take 1,000 mg by mouth 2 (two) times daily as needed for pain.     [provider]  albuterol (PROVENTIL) (2.5 MG/3ML) 0.083% nebulizer solution Take 3 mLs (2.5 mg total) by nebulization every 4 (four) hours as needed for wheezing or shortness of breath. 07/30/18   Lavina Hamman, MD  arformoterol (BROVANA) 15 MCG/2ML NEBU Take 2 mLs (15 mcg total) by nebulization 2 (two) times daily. Dx: J44.9 08/07/18   Parrett, Fonnie Mu, NP  bisoprolol (ZEBETA) 5 MG tablet Take 1 tablet (5 mg total) by mouth daily. 05/14/17   Tammi Klippel, Sherin, DO  budesonide (PULMICORT) 0.5 MG/2ML nebulizer solution Take 2 mLs (0.5 mg total) by nebulization 2 (two) times daily. Dx: J44.9 08/07/18   Parrett, Fonnie Mu, NP  calcium-vitamin D (OSCAL WITH D) 500-200 MG-UNIT per tablet Take 1 tablet by mouth 2 (two) times daily. Patient taking differently: Take 1 tablet by mouth daily.  06/06/13   Riebock, Estill Bakes, NP  clonazePAM (KLONOPIN) 0.5 MG tablet Take 1 tablet (0.5 mg total) by mouth 2 (two) times daily as needed for anxiety. Patient taking differently: Take 0.5 mg by mouth 3 times/day as needed-between meals & bedtime for anxiety (sleep).  06/06/13   Riebock, Estill Bakes, NP  clopidogrel (PLAVIX) 75 MG tablet Take 1 tablet (75 mg total) by mouth daily. 05/14/17   Caroline More, DO  Diphenhyd-Hydrocort-Nystatin (FIRST-DUKES MOUTHWASH) SUSP  Gargle and swallow 1 tsp four times daily 08/07/18   Parrett, Fonnie Mu, NP  diphenhydrAMINE (BENADRYL) 25 mg capsule Take 25 mg by mouth every 6 (six) hours as needed for allergies.     [provider]  fluticasone (FLONASE) 50 MCG/ACT nasal spray Place 2 sprays into both nostrils daily. 07/31/18   Lavina Hamman, MD  guaiFENesin (MUCINEX) 600 MG 12 hr tablet Take 1 tablet (600 mg total) by mouth 2 (two) times daily. 07/30/18   Lavina Hamman, MD  ipratropium-albuterol (DUONEB) 0.5-2.5 (3) MG/3ML SOLN Take 3 mLs by nebulization daily. Dx: J44.9 08/09/18   Collene Gobble, MD  loratadine (CLARITIN) 10 MG tablet Take 1 tablet (10 mg total) by mouth daily. 07/31/18   Lavina Hamman, MD  lovastatin (MEVACOR) 40 MG tablet Take 40 mg by mouth every morning.    [provider]  PARoxetine (PAXIL) 20 MG tablet Take 20 mg by mouth daily.    [provider]  sodium chloride (OCEAN) 0.65 % SOLN nasal spray Place 1  spray into both nostrils as needed for congestion.    [provider]  VENTOLIN HFA 108 (90 Base) MCG/ACT inhaler Inhale 2 puffs into the lungs every 6 (six) hours as needed for wheezing or shortness of breath.  05/23/17   [provider]    Physical Exam: Vitals:   08/15/18 1734 08/15/18 1743 08/15/18 1813 08/15/18 1833  BP:    (!) 147/76  Pulse:  78  80  Resp:  17  (!) 21  Temp: (!) 97.4 F (36.3 C)     TempSrc: Oral     SpO2:  98%  100%  Weight:   61.7 kg   Height:   _0  (1.575 m)    General: Not in acute distress HEENT:       Eyes: PERRL, EOMI, no scleral icterus.       ENT: No discharge from the ears and nose, no pharynx injection, no tonsillar enlargement.        Neck: No JVD, no bruit, no mass felt. Heme: No neck lymph node enlargement. Cardiac: S1/S2, RRR, No murmurs, No gallops or rubs. Respiratory: Has severely decreased air movement bilaterally, has wheezing bilaterally. GI: Soft, nondistended, nontender, no rebound pain, no  organomegaly, BS present. GU: No hematuria Ext: No pitting leg edema bilaterally. 2+DP/PT pulse bilaterally. Musculoskeletal: No joint deformities, No joint redness or warmth, no limitation of ROM in spin. Skin: No rashes.  Neuro: Alert, oriented X3, cranial nerves II-XII grossly intact, moves all extremities normally.  Psych: Patient is not psychotic, no suicidal or hemocidal ideation.  Labs on Admission: I have personally reviewed following labs and imaging studies  CBC: Recent Labs  Lab 08/15/18 1812  WBC 9.2  NEUTROABS 7.1  HGB 10.7*  HCT 35.1*  MCV 96.2  PLT 841   Basic Metabolic Panel: Recent Labs  Lab 08/15/18 1812  NA 138  K 4.5  CL 103  CO2 26  GLUCOSE 108*  BUN 21  CREATININE 0.79  CALCIUM 9.2   GFR: Estimated Creatinine Clearance: 54.1 mL/min (by C-G formula based on SCr of 0.79 mg/dL). Liver Function Tests: Recent Labs  Lab 08/15/18 1812  AST 25  ALT 17  ALKPHOS 63  BILITOT 1.0  PROT 6.6  ALBUMIN 3.7   No results for input(s): LIPASE, AMYLASE in the last 168 hours. No results for input(s): AMMONIA in the last 168 hours. Coagulation Profile: No results for input(s): INR, PROTIME in the last 168 hours. Cardiac Enzymes: No results for input(s): CKTOTAL, CKMB, CKMBINDEX, TROPONINI in the last 168 hours. BNP (last 3 results) No results for input(s): PROBNP in the last 8760 hours. HbA1C: No results for input(s): HGBA1C in the last 72 hours. CBG: No results for input(s): GLUCAP in the last 168 hours. Lipid Profile: No results for input(s): CHOL, HDL, LDLCALC, TRIG, CHOLHDL, LDLDIRECT in the last 72 hours. Thyroid Function Tests: No results for input(s): TSH, T4TOTAL, FREET4, T3FREE, THYROIDAB in the last 72 hours. Anemia Panel: No results for input(s): VITAMINB12, FOLATE, FERRITIN, TIBC, IRON, RETICCTPCT in the last 72 hours. Urine analysis:    Component Value Date/Time   COLORURINE STRAW (A) 04/03/2018 0554   APPEARANCEUR CLEAR 04/03/2018  0554   LABSPEC 1.006 04/03/2018 0554   PHURINE 6.0 04/03/2018 [place] 04/03/2018 0554   HGBUR NEGATIVE 04/03/2018 0554   BILIRUBINUR NEGATIVE 04/03/2018 [place] 04/03/2018 0554   PROTEINUR NEGATIVE 04/03/2018 0554   UROBILINOGEN 0.2 06/02/2013 2253   NITRITE NEGATIVE 04/03/2018 0554  LEUKOCYTESUR NEGATIVE 04/03/2018 0554   Sepsis Labs: _0 (procalcitonin:4,lacticidven:4) )No results found for this or any previous visit (from the past 240 hour(s)).   Radiological Exams on Admission: Dg Chest Port 1 View  Result Date: 08/15/2018 CLINICAL DATA:  COPD exacerbation and hypercarbia 3 weeks ago. Two days ago patient started experiencing dyspnea on exertion. EXAM: PORTABLE CHEST 1 VIEW COMPARISON:  07/26/2018 FINDINGS: Stable heart and mediastinal contours with moderate aortic atherosclerosis at the arch. Nonaneurysmal appearance of the thoracic aorta is noted. Emphysematous bullous hyperinflation of the lungs, upper lobe predominant and right greater than left is redemonstrated without alveolar consolidation, pulmonary edema, effusion or pneumothorax. No acute nor concerning osseous abnormalities. 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 No active pulmonary disease. Electronically Signed   By: Ashley Royalty M.D.   On: 08/15/2018 18:59     EKG:   Not done in ED, will get one.   Assessment/Plan Principal Problem:   COPD exacerbation (HCC) Active Problems:   Hypertension   Hyperlipidemia   Depression with anxiety   Acute on chronic respiratory failure with hypoxia (HCC)   GERD (gastroesophageal reflux disease)    Acute on chronic respiratory failure with hypoxia due to COPD exacerbation: Patient has worsening shortness of breath, productive cough, severely decreased air movement, bilateral wheezing on auscultation, clinically consistent with COPD exacerbation.  Chest x-ray did not show  infiltration. Pt had negative respiratory virus panel in recent admission. Pt received 2 g of magnesium sulfate in ED.  -will place on tele bed for obs -Nebulizers: scheduled Duoneb and prn albuterol -Solu-Medrol 60 mg IV tid -Doxycycline -Mucinex for cough  -Incentive spirometry -Follow up blood culture x2, sputum culture -Nasal cannula oxygen as needed to maintain O2 saturation 92% or greater  HTN:  -Continue home medications: Zeteba -IV hydralazine prn  Hyperlipidemia: -Pravastatin  Depression and anxiety: Stable, no suicidal or homicidal ideations. -Continue home medications: Klonopin, Paxil  GERD: -start Protonix -Mylanta   DVT ppx:   SQ Lovenox Code Status: Full code Family Communication: None at bed side.    Disposition Plan:  Anticipate discharge back to previous home environment Consults called:  none Admission status: Obs / tele     Date of Service 08/15/2018    Ivor Costa [facility] Pager [phone]  If 7PM-7AM, please contact night-coverage [email or web address] Password [email or web address] 08/15/2018, 8:04 PM

## 2018-08-15 NOTE — Assessment & Plan Note (Addendum)
Acute exacerbation - will need hospital admission  [facility] and [facility] have no open beds , waiting in ER .  Will need to transfer to ER via EMS for further evaluation and probable admission  She was given xopenex neb in office  She is in stable condition for transport.   Plan  Transfer to ER via EMS

## 2018-08-15 NOTE — ED Notes (Signed)
Bed: WA08 Expected date:  Expected time:  Means of arrival:  Comments: EMS [facility]

## 2018-08-15 NOTE — ED Notes (Signed)
ED TO INPATIENT HANDOFF REPORT  Name/Age/Gender Kayla Mack 72 y.o. female  Code Status    Code Status Orders  (From admission, onward)         Start     Ordered   08/15/18 1951  Limited resuscitation (code)  Continuous    Question Answer Comment  In the event of cardiac or respiratory ARREST: Initiate Code Blue, Call Rapid Response Yes   In the event of cardiac or respiratory ARREST: Perform CPR Yes   In the event of cardiac or respiratory ARREST: Perform Intubation/Mechanical Ventilation No   In the event of cardiac or respiratory ARREST: Use NIPPV/BiPAp only if indicated Yes   In the event of cardiac or respiratory ARREST: Administer ACLS medications if indicated Yes   In the event of cardiac or respiratory ARREST: Perform Defibrillation or Cardioversion if indicated Yes      08/15/18 1951        Code Status History    Date Active Date Inactive Code Status Order ID Comments User Context   07/27/2018 1524 07/30/2018 1720 Partial Code [id number]  Collene Gobble, MD Inpatient   07/26/2018 1819 07/27/2018 1524 Full Code [id number]  Merton Border, MD Inpatient   04/02/2018 0826 04/04/2018 1628 Full Code [id number]  Rondel Jumbo, PA-C ED   05/04/2017 1051 05/13/2017 1740 Full Code [id number]  Tonette Bihari, MD ED      Home/SNF/Other Home  Chief Complaint shortness of breath  Level of Care/Admitting Diagnosis ED Disposition    ED Disposition Condition [place]: [facility] [[id number]]  Level of Care: Telemetry [5]  Admit to tele based on following criteria: Other see comments  Comments: SOB  Diagnosis: COPD exacerbation [facility]) [[id number]]  Admitting Physician: Ivor Costa [[id number]]  Attending Physician: Ivor Costa [[id number]]  PT Class (Do Not Modify): Observation [104]  PT Acc Code (Do Not Modify): Observation [10022]       Medical History Past Medical History:  Diagnosis Date  . Anxiety   . COPD (chronic obstructive  pulmonary disease) ([place])   . Depression   . Hyperlipidemia   . Hypertension     Allergies Allergies  Allergen Reactions  . Aleve [Naproxen Sodium] Hives  . Aspirin Swelling    Facial swelling  . Penicillins Hives and Swelling    Has patient had a PCN reaction causing immediate rash, facial/tongue/throat swelling, SOB or lightheadedness with hypotension: Yes Has patient had a PCN reaction causing severe rash involving mucus membranes or skin necrosis: Yes Has patient had a PCN reaction that required hospitalization: Unk Has patient had a PCN reaction occurring within the last 10 years: No If all of the above answers are "NO", then may proceed with Cephalosporin use.     IV Location/Drains/Wounds Patient Lines/Drains/Airways Status   Active Line/Drains/Airways    Name:   Placement date:   Placement time:   Site:   Days:   Peripheral IV 08/15/18 Left Antecubital   08/15/18    1807    Antecubital   less than 1          Labs/Imaging Results for orders placed or performed during the hospital encounter of 08/15/18 (from the past 48 hour(s))  Blood gas, venous     Status: Abnormal   Collection Time: 08/15/18  5:50 PM  Result Value Ref Range   pH, Ven 7.382 7.250 - 7.430   pCO2, Ven 50.0 44.0 - 60.0 mmHg   pO2, Ven 108.0 (H)  32.0 - 45.0 mmHg   Bicarbonate 29.0 (H) 20.0 - 28.0 mmol/L   Acid-Base Excess 3.7 (H) 0.0 - 2.0 mmol/L   O2 Saturation 97.9 %   Patient temperature 98.6    Collection site VENOUS    Drawn by DRAWN BY RN    Sample type VENOUS     Comment: Performed at [facility], [place] [address], [place], [place] [address]  Comprehensive metabolic panel     Status: Abnormal   Collection Time: 08/15/18  6:12 PM  Result Value Ref Range   Sodium 138 135 - 145 mmol/L   Potassium 4.5 3.5 - 5.1 mmol/L   Chloride 103 98 - 111 mmol/L   CO2 26 22 - 32 mmol/L   Glucose, Bld 108 (H) 70 - 99 mg/dL   BUN 21 8 - 23 mg/dL   Creatinine, Ser 0.79 0.44 - 1.00 mg/dL    Calcium 9.2 8.9 - 10.3 mg/dL   Total Protein 6.6 6.5 - 8.1 g/dL   Albumin 3.7 3.5 - 5.0 g/dL   AST 25 15 - 41 U/L   ALT 17 0 - 44 U/L   Alkaline Phosphatase 63 38 - 126 U/L   Total Bilirubin 1.0 0.3 - 1.2 mg/dL   GFR calc non Af Amer >60 >60 mL/min   GFR calc Af Amer >60 >60 mL/min    Comment: (NOTE) The eGFR has been calculated using the CKD EPI equation. This calculation has not been validated in all clinical situations. eGFR's persistently <60 mL/min signify possible Chronic Kidney Disease.    Anion gap 9 5 - 15    Comment: Performed at [facility], [place] [address] [place], [place] [address]  CBC WITH DIFFERENTIAL     Status: Abnormal   Collection Time: 08/15/18  6:12 PM  Result Value Ref Range   WBC 9.2 4.0 - 10.5 K/uL   RBC 3.65 (L) 3.87 - 5.11 MIL/uL   Hemoglobin 10.7 (L) 12.0 - 15.0 g/dL   HCT 35.1 (L) 36.0 - 46.0 %   MCV 96.2 80.0 - 100.0 fL   MCH 29.3 26.0 - 34.0 pg   MCHC 30.5 30.0 - 36.0 g/dL   RDW 13.8 11.5 - 15.5 %   Platelets 297 150 - 400 K/uL   nRBC 0.0 0.0 - 0.2 %   Neutrophils Relative % 78 %   Neutro Abs 7.1 1.7 - 7.7 K/uL   Lymphocytes Relative 13 %   Lymphs Abs 1.2 0.7 - 4.0 K/uL   Monocytes Relative 4 %   Monocytes Absolute 0.4 0.1 - 1.0 K/uL   Eosinophils Relative 4 %   Eosinophils Absolute 0.4 0.0 - 0.5 K/uL   Basophils Relative 1 %   Basophils Absolute 0.1 0.0 - 0.1 K/uL   Immature Granulocytes 0 %   Abs Immature Granulocytes 0.03 0.00 - 0.07 K/uL    Comment: Performed at [facility], [place] [address] [place], [place] [address]  I-stat troponin, ED (not at [facility], [facility])     Status: None   Collection Time: 08/15/18  6:24 PM  Result Value Ref Range   Troponin i, poc 0.00 0.00 - 0.08 ng/mL   Comment 3            Comment: Due to the release kinetics of cTnI, a negative result within the first hours of the onset of symptoms does not rule out myocardial infarction with certainty. If myocardial infarction is  still suspected, repeat the test at appropriate  intervals.   I-Stat CG4 Lactic Acid, ED  (not at  [facility])     Status: None   Collection Time: 08/15/18  6:26 PM  Result Value Ref Range   Lactic Acid, Venous 1.46 0.5 - 1.9 mmol/L  Urinalysis, Routine w reflex microscopic     Status: Abnormal   Collection Time: 08/15/18  8:25 PM  Result Value Ref Range   Color, Urine STRAW (A) YELLOW   APPearance CLEAR CLEAR   Specific Gravity, Urine 1.012 1.005 - 1.030   pH 6.0 5.0 - 8.0   Glucose, UA NEGATIVE NEGATIVE mg/dL   Hgb urine dipstick NEGATIVE NEGATIVE   Bilirubin Urine NEGATIVE NEGATIVE   Ketones, ur NEGATIVE NEGATIVE mg/dL   Protein, ur NEGATIVE NEGATIVE mg/dL   Nitrite NEGATIVE NEGATIVE   Leukocytes, UA SMALL (A) NEGATIVE   RBC / HPF 0-5 0 - 5 RBC/hpf   WBC, UA 11-20 0 - 5 WBC/hpf   Bacteria, UA RARE (A) NONE SEEN   Squamous Epithelial / LPF 0-5 0 - 5   Mucus PRESENT     Comment: Performed at [facility], [place] [address], [place], [place] [address]   Dg Chest Port 1 View  Result Date: 08/15/2018 CLINICAL DATA:  COPD exacerbation and hypercarbia 3 weeks ago. Two days ago patient started experiencing dyspnea on exertion. EXAM: PORTABLE CHEST 1 VIEW COMPARISON:  07/26/2018 FINDINGS: Stable heart and mediastinal contours with moderate aortic atherosclerosis at the arch. Nonaneurysmal appearance of the thoracic aorta is noted. Emphysematous bullous hyperinflation of the lungs, upper lobe predominant and right greater than left is redemonstrated without alveolar consolidation, pulmonary edema, effusion or pneumothorax. No acute nor concerning osseous abnormalities. 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 No active pulmonary disease. Electronically Signed   By: Ashley Royalty M.D.   On: 08/15/2018 18:59   None  Pending Labs Unresulted Labs (From admission, onward)    Start     Ordered   08/15/18 1952  Culture,  sputum-assessment  Once,   R     08/15/18 1951   08/15/18 1731  Blood Culture (routine x 2)  BLOOD CULTURE X 2,   STAT     08/15/18 1731   08/15/18 1731  Urine culture  STAT,   STAT     08/15/18 1731          Vitals/Pain Today's Vitals   08/15/18 1833 08/15/18 2025 08/15/18 2143 08/15/18 2244  BP: (!) 147/76 139/69  128/69  Pulse: 80 95  95  Resp: (!) _0 Temp:      TempSrc:      SpO2: 100% 96% 97% 95%  Weight:      Height:      PainSc:        Isolation Precautions No active isolations  Medications Medications  bisoprolol (ZEBETA) tablet 5 mg (has no administration in time range)  pravastatin (PRAVACHOL) tablet 40 mg (has no administration in time range)  PARoxetine (PAXIL) tablet 20 mg (has no administration in time range)  clopidogrel (PLAVIX) tablet 75 mg (has no administration in time range)  clonazePAM (KLONOPIN) tablet 0.5 mg (has no administration in time range)  calcium-vitamin D (OSCAL WITH D) 500-200 MG-UNIT per tablet 1 tablet (has no administration in time range)  arformoterol (BROVANA) nebulizer solution 15 mcg (has no administration in time range)  diphenhydrAMINE (BENADRYL) capsule 25 mg (has no administration in time range)  fluticasone (FLONASE) 50 MCG/ACT nasal spray  2 spray (has no administration in time range)  guaiFENesin (MUCINEX) 12 hr tablet 600 mg (has no administration in time range)  loratadine (CLARITIN) tablet 10 mg (has no administration in time range)  sodium chloride (OCEAN) 0.65 % nasal spray 1 spray (has no administration in time range)  albuterol (PROVENTIL) (2.5 MG/3ML) 0.083% nebulizer solution 2.5 mg (has no administration in time range)  methylPREDNISolone sodium succinate (SOLU-MEDROL) 125 mg/2 mL injection 60 mg (has no administration in time range)  doxycycline (VIBRA-TABS) tablet 100 mg (has no administration in time range)  pantoprazole (PROTONIX) EC tablet 40 mg (has no administration in time range)  enoxaparin  (LOVENOX) injection 40 mg (has no administration in time range)  ondansetron (ZOFRAN) injection 4 mg (has no administration in time range)  hydrALAZINE (APRESOLINE) injection 5 mg (has no administration in time range)  acetaminophen (TYLENOL) tablet 650 mg (has no administration in time range)  zolpidem (AMBIEN) tablet 5 mg (has no administration in time range)  alum & mag hydroxide-simeth (MAALOX/MYLANTA) 200-200-20 MG/5ML suspension 30 mL (has no administration in time range)  ipratropium-albuterol (DUONEB) 0.5-2.5 (3) MG/3ML nebulizer solution 3 mL (has no administration in time range)  sodium chloride 0.9 % bolus 1,000 mL (0 mLs Intravenous Stopped 08/15/18 1845)  albuterol (PROVENTIL,VENTOLIN) solution continuous neb (15 mg/hr Nebulization Given 08/15/18 1742)  ipratropium (ATROVENT) nebulizer solution 0.5 mg (0.5 mg Nebulization Given 08/15/18 1742)  methylPREDNISolone sodium succinate (SOLU-MEDROL) 125 mg/2 mL injection 125 mg (125 mg Intravenous Given 08/15/18 1808)  magnesium sulfate IVPB 2 g 50 mL (0 g Intravenous Stopped 08/15/18 1949)    Mobility Walks normally, exertional SOB currently

## 2018-08-15 NOTE — Plan of Care (Signed)
Sputum Cup labelled/placed in room, pt. made aware.

## 2018-08-15 NOTE — ED Provider Notes (Signed)
[facility] DEPT Provider Note   CSN: [id number] Arrival date & time: 08/15/18  1703     History   Chief Complaint Chief Complaint  Patient presents with  . Shortness of Breath    HPI Kayla Mack is a 72 y.o. female history of COPD on 6 L nasal cannula at baseline, depression, hyperlipidemia, here presenting with worsening shortness of breath, cough, fever.  Patient was admitted to the hospital for COPD exacerbation about 3 weeks ago.  Patient also has known pulmonary hypertension.  Patient still on p.o. steroids and has worsening shortness of breath for the 3 days.  Patient went to her doctor's office and was given 1 albuterol nebulizer.  Patient was noted to be febrile 101 per EMS but no meds were given.  Patient does have some subjective chills for the last 2 days as well but no documented fever at home.   The history is provided by the patient.    Past Medical History:  Diagnosis Date  . Anxiety   . COPD (chronic obstructive pulmonary disease) ([place])   . Depression   . Hyperlipidemia   . Hypertension     Patient Active Problem List   Diagnosis Date Noted  . Depression with anxiety 08/15/2018  . Respiratory failure ([place]) 07/26/2018  . Depression 04/02/2018  . Anxiety 04/02/2018  . Hypertension 04/02/2018  . Hyperlipidemia 04/02/2018  . Chronic respiratory failure with hypoxia ([place]) 12/29/2017  . Status post carpal tunnel release 12/08/2017  . Carpal tunnel syndrome, right upper limb 10/17/2017  . Trigger finger, right ring finger 10/17/2017  . Pulmonary hypertension ([place])   . SOB (shortness of breath)   . Hyperkalemia   . Elevated troponin   . COPD (chronic obstructive pulmonary disease) ([place]) 05/04/2017  . Acute respiratory distress   . Tachycardia   . Hypokalemia   . Hypotension   . Tobacco use disorder 06/04/2013  . T12 burst fracture ([place]) 06/03/2013  . Left radius fracture 06/03/2013  . Right calcaneal fracture  06/03/2013  . Fall from ladder 06/03/2013  . Osteopenia of the elderly 06/03/2013    Past Surgical History:  Procedure Laterality Date  . APPENDECTOMY    . RIGHT/LEFT HEART CATH AND CORONARY ANGIOGRAPHY N/A 05/09/2017   Procedure: Right/Left Heart Cath and Coronary Angiography;  Surgeon: [name], MD;  Location: [facility];  Service: Cardiovascular;  Laterality: N/A;  . SHOULDER SURGERY Right Pin  . TONSILLECTOMY       OB History   None      Home Medications    Prior to Admission medications   Medication Sig Start Date End Date Taking? Authorizing Provider  acetaminophen (TYLENOL) 500 MG tablet Take 1,000 mg by mouth 2 (two) times daily as needed for pain.     [provider]  albuterol (PROVENTIL) (2.5 MG/3ML) 0.083% nebulizer solution Take 3 mLs (2.5 mg total) by nebulization every 4 (four) hours as needed for wheezing or shortness of breath. 07/30/18   Lavina Hamman, MD  arformoterol (BROVANA) 15 MCG/2ML NEBU Take 2 mLs (15 mcg total) by nebulization 2 (two) times daily. Dx: J44.9 08/07/18   Parrett, Fonnie Mu, NP  bisoprolol (ZEBETA) 5 MG tablet Take 1 tablet (5 mg total) by mouth daily. 05/14/17   Tammi Klippel, Sherin, DO  budesonide (PULMICORT) 0.5 MG/2ML nebulizer solution Take 2 mLs (0.5 mg total) by nebulization 2 (two) times daily. Dx: J44.9 08/07/18   Parrett, Fonnie Mu, NP  calcium-vitamin D (OSCAL WITH D)  500-200 MG-UNIT per tablet Take 1 tablet by mouth 2 (two) times daily. Patient taking differently: Take 1 tablet by mouth daily.  06/06/13   Riebock, Estill Bakes, NP  clonazePAM (KLONOPIN) 0.5 MG tablet Take 1 tablet (0.5 mg total) by mouth 2 (two) times daily as needed for anxiety. Patient taking differently: Take 0.5 mg by mouth 3 times/day as needed-between meals & bedtime for anxiety (sleep).  06/06/13   Riebock, Estill Bakes, NP  clopidogrel (PLAVIX) 75 MG tablet Take 1 tablet (75 mg total) by mouth daily. 05/14/17   Caroline More, DO    Diphenhyd-Hydrocort-Nystatin (FIRST-DUKES MOUTHWASH) SUSP Gargle and swallow 1 tsp four times daily 08/07/18   Parrett, Fonnie Mu, NP  diphenhydrAMINE (BENADRYL) 25 mg capsule Take 25 mg by mouth every 6 (six) hours as needed for allergies.     [provider]  fluticasone (FLONASE) 50 MCG/ACT nasal spray Place 2 sprays into both nostrils daily. 07/31/18   Lavina Hamman, MD  guaiFENesin (MUCINEX) 600 MG 12 hr tablet Take 1 tablet (600 mg total) by mouth 2 (two) times daily. 07/30/18   Lavina Hamman, MD  ipratropium-albuterol (DUONEB) 0.5-2.5 (3) MG/3ML SOLN Take 3 mLs by nebulization daily. Dx: J44.9 08/09/18   Collene Gobble, MD  loratadine (CLARITIN) 10 MG tablet Take 1 tablet (10 mg total) by mouth daily. 07/31/18   Lavina Hamman, MD  lovastatin (MEVACOR) 40 MG tablet Take 40 mg by mouth every morning.    [provider]  PARoxetine (PAXIL) 20 MG tablet Take 20 mg by mouth daily.    [provider]  sodium chloride (OCEAN) 0.65 % SOLN nasal spray Place 1 spray into both nostrils as needed for congestion.    [provider]  VENTOLIN HFA 108 (90 Base) MCG/ACT inhaler Inhale 2 puffs into the lungs every 6 (six) hours as needed for wheezing or shortness of breath.  05/23/17   [provider]    Family History Family History  Problem Relation Age of Onset  . Heart disease Mother   . Heart failure Mother   . Heart disease Father        cabg  . Heart disease Sister   . Diabetes Sister     Social History Social History   Tobacco Use  . Smoking status: Former Smoker    Packs/day: 1.50    Years: 40.00    Pack years: 60.00    Types: Cigarettes    Last attempt to quit: 03/18/2017    Years since quitting: 1.4  . Smokeless tobacco: Never Used  Substance Use Topics  . Alcohol use: Yes    Comment: liquor   . Drug use: No     Allergies   Aleve [naproxen sodium]; Aspirin; and Penicillins   Review of Systems Review of Systems   Respiratory: Positive for shortness of breath.   All other systems reviewed and are negative.    Physical Exam Updated Vital Signs BP (!) 147/76   Pulse 80   Temp (!) 97.4 F (36.3 C) (Oral)   Resp (!) 21   Ht _0  (1.575 m)   Wt 61.7 kg   SpO2 100%   BMI 24.87 kg/m   Physical Exam  Constitutional: She is oriented to person, place, and time.  tachypneic   HENT:  Head: Normocephalic.  Mouth/Throat: Oropharynx is clear and moist.  Eyes: Pupils are equal, round, and reactive to light. EOM are normal.  Neck: Normal range of motion.  Cardiovascular: Normal rate.  Pulmonary/Chest:  Tachypneic, mild diffuse wheezing, mild retractions   Abdominal: Soft. Bowel sounds are normal.  Musculoskeletal: Normal range of motion.       Right lower leg: Normal.       Left lower leg: Normal.  Neurological: She is alert and oriented to person, place, and time.  Skin: Skin is warm. Capillary refill takes less than 2 seconds.  Psychiatric: She has a normal mood and affect. Her behavior is normal.  Nursing note and vitals reviewed.    ED Treatments / Results  Labs (all labs ordered are listed, but only abnormal results are displayed) Labs Reviewed  COMPREHENSIVE METABOLIC PANEL - Abnormal; Notable for the following components:      Result Value   Glucose, Bld 108 (*)    All other components within normal limits  CBC WITH DIFFERENTIAL/PLATELET - Abnormal; Notable for the following components:   RBC 3.65 (*)    Hemoglobin 10.7 (*)    HCT 35.1 (*)    All other components within normal limits  BLOOD GAS, VENOUS - Abnormal; Notable for the following components:   pO2, Ven 108.0 (*)    Bicarbonate 29.0 (*)    Acid-Base Excess 3.7 (*)    All other components within normal limits  CULTURE, BLOOD (ROUTINE X 2)  CULTURE, BLOOD (ROUTINE X 2)  URINE CULTURE  URINALYSIS, ROUTINE W REFLEX MICROSCOPIC  I-STAT CG4 LACTIC ACID, ED  I-STAT TROPONIN, ED  I-STAT CG4 LACTIC ACID, ED     EKG None  Radiology Dg Chest Port 1 View  Result Date: 08/15/2018 CLINICAL DATA:  COPD exacerbation and hypercarbia 3 weeks ago. Two days ago patient started experiencing dyspnea on exertion. EXAM: PORTABLE CHEST 1 VIEW COMPARISON:  07/26/2018 FINDINGS: Stable heart and mediastinal contours with moderate aortic atherosclerosis at the arch. Nonaneurysmal appearance of the thoracic aorta is noted. Emphysematous bullous hyperinflation of the lungs, upper lobe predominant and right greater than left is redemonstrated without alveolar consolidation, pulmonary edema, effusion or pneumothorax. No acute nor concerning osseous abnormalities. 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 No active pulmonary disease. Electronically Signed   By: Ashley Royalty M.D.   On: 08/15/2018 18:59    Procedures Procedures (including critical care time)  CRITICAL CARE Performed by: Wandra Arthurs   Total critical care time: 30 minutes  Critical care time was exclusive of separately billable procedures and treating other patients.  Critical care was necessary to treat or prevent imminent or life-threatening deterioration.  Critical care was time spent personally by me on the following activities: development of treatment plan with patient and/or surrogate as well as nursing, discussions with consultants, evaluation of patient's response to treatment, examination of patient, obtaining history from patient or surrogate, ordering and performing treatments and interventions, ordering and review of laboratory studies, ordering and review of radiographic studies, pulse oximetry and re-evaluation of patient's condition.   Medications Ordered in ED Medications  sodium chloride 0.9 % bolus 1,000 mL (1,000 mLs Intravenous New Bag/Given 08/15/18 1814)  albuterol (PROVENTIL,VENTOLIN) solution continuous neb (15 mg/hr Nebulization Given 08/15/18 1742)  ipratropium  (ATROVENT) nebulizer solution 0.5 mg (0.5 mg Nebulization Given 08/15/18 1742)  methylPREDNISolone sodium succinate (SOLU-MEDROL) 125 mg/2 mL injection 125 mg (125 mg Intravenous Given 08/15/18 1808)  magnesium sulfate IVPB 2 g 50 mL (2 g Intravenous New Bag/Given 08/15/18 1809)     Initial Impression / Assessment and Plan / ED Course  I have reviewed the triage vital signs  and the nursing notes.  Pertinent labs & imaging results that were available during my care of the patient were reviewed by me and considered in my medical decision making (see chart for details).    Kayla Mack is a 72 y.o. female here with SOB. I think likely COPD vs pulmonary hypertension vs pneumonia. Patient had fever 101 per EMS but temp is 97.4 in the ED. Will get labs, lactate, cultures, CXR. Will give continuous nebs, steroids, magnesium. Will need admission.   7:37 PM VBG reassuring on 6 L [place]. CXR showed no pneumonia. WBC nl, lactate nl. Felt better after nebs. Will admit for COPD exacerbation.     Final Clinical Impressions(s) / ED Diagnoses   Final diagnoses:  COPD exacerbation [facility])    ED Discharge Orders    None       Drenda Freeze, MD 08/15/18 [phone]

## 2018-08-15 NOTE — ED Triage Notes (Signed)
Pt BIB [facility] from [place]. 3 weeks ago pt became sick and was admitted for hypercarbia and COPD exacerbation. 2 days ago she started experiencing SOb upon exertion. She went to [facility] and was given 1 treatment 64m albuterol. Pt is currently on prednisone from previous hospital admission. Temp is 101 via EMS.

## 2018-08-16 ENCOUNTER — Other Ambulatory Visit: Payer: Self-pay

## 2018-08-16 ENCOUNTER — Telehealth: Payer: Self-pay | Admitting: Adult Health

## 2018-08-16 ENCOUNTER — Encounter (HOSPITAL_COMMUNITY): Payer: Self-pay

## 2018-08-16 DIAGNOSIS — F418 Other specified anxiety disorders: Secondary | ICD-10-CM | POA: Diagnosis not present

## 2018-08-16 DIAGNOSIS — E785 Hyperlipidemia, unspecified: Secondary | ICD-10-CM | POA: Diagnosis not present

## 2018-08-16 DIAGNOSIS — J441 Chronic obstructive pulmonary disease with (acute) exacerbation: Secondary | ICD-10-CM | POA: Diagnosis present

## 2018-08-16 DIAGNOSIS — Z7902 Long term (current) use of antithrombotics/antiplatelets: Secondary | ICD-10-CM | POA: Diagnosis not present

## 2018-08-16 DIAGNOSIS — I10 Essential (primary) hypertension: Secondary | ICD-10-CM | POA: Diagnosis not present

## 2018-08-16 DIAGNOSIS — K219 Gastro-esophageal reflux disease without esophagitis: Secondary | ICD-10-CM | POA: Diagnosis not present

## 2018-08-16 DIAGNOSIS — Z87891 Personal history of nicotine dependence: Secondary | ICD-10-CM | POA: Diagnosis not present

## 2018-08-16 DIAGNOSIS — J9621 Acute and chronic respiratory failure with hypoxia: Secondary | ICD-10-CM

## 2018-08-16 DIAGNOSIS — I272 Pulmonary hypertension, unspecified: Secondary | ICD-10-CM | POA: Diagnosis present

## 2018-08-16 DIAGNOSIS — Z7951 Long term (current) use of inhaled steroids: Secondary | ICD-10-CM | POA: Diagnosis not present

## 2018-08-16 DIAGNOSIS — Z79899 Other long term (current) drug therapy: Secondary | ICD-10-CM | POA: Diagnosis not present

## 2018-08-16 DIAGNOSIS — Z9981 Dependence on supplemental oxygen: Secondary | ICD-10-CM | POA: Diagnosis not present

## 2018-08-16 DIAGNOSIS — Z880 Allergy status to penicillin: Secondary | ICD-10-CM | POA: Diagnosis not present

## 2018-08-16 DIAGNOSIS — Z8249 Family history of ischemic heart disease and other diseases of the circulatory system: Secondary | ICD-10-CM | POA: Diagnosis not present

## 2018-08-16 DIAGNOSIS — Z886 Allergy status to analgesic agent status: Secondary | ICD-10-CM | POA: Diagnosis not present

## 2018-08-16 MED ORDER — METHYLPREDNISOLONE SODIUM SUCC 125 MG IJ SOLR
60.0000 mg | Freq: Four times a day (QID) | INTRAMUSCULAR | Status: DC
  Administered 2018-08-16 – 2018-08-19 (×12): 60 mg via INTRAVENOUS
  Filled 2018-08-16 (×12): qty 2

## 2018-08-16 MED ORDER — LORATADINE 10 MG PO TABS
10.0000 mg | ORAL_TABLET | Freq: Every day | ORAL | Status: DC | PRN
  Administered 2018-08-16: 10 mg via ORAL
  Filled 2018-08-16: qty 1

## 2018-08-16 NOTE — Progress Notes (Signed)
[facility]  PROGRESS NOTE  Kayla Mack HMC:[id number] DOB: [birth year] DOA: 08/15/2018 PCP: Jani Gravel, MD   Brief HPI:   72 year old female with a history of chronic respiratory failure due to COPD on 4 to 6 L of oxygen via nasal cannula, hypertension, hyperlipidemia, depression with anxiety, pulmonary hypertension came to the hospital with shortness of breath and cough.  Patient treated with COPD exacerbation    Subjective   This morning patient feels better, but still becomes hypoxic on ambulation.  O2 sats dropped to 83% on 6 L of oxygen via nasal cannula.   Assessment/Plan:     1. Acute on chronic hypoxic respiratory failure-due to COPD exacerbation, continue Solu-Medrol 60 mg IV every 6 hours, DuoNeb nebulizer every 6 hours, Mucinex 600 mg p.o. twice daily.  Continue antibiotics doxycycline  2. Hypertension-blood pressure is controlled, continue Zebeta  3. Hyperlipidemia-continue pravastatin  4. Depression/anxiety-stable, no suicidal or homicidal ideation, continue Paxil, Klonopin  5. GERD-started on Protonix, Mylanta as needed     CBC: Recent Labs  Lab 08/15/18 1812  WBC 9.2  NEUTROABS 7.1  HGB 10.7*  HCT 35.1*  MCV 96.2  PLT 662    Basic Metabolic Panel: Recent Labs  Lab 08/15/18 1812  NA 138  K 4.5  CL 103  CO2 26  GLUCOSE 108*  BUN 21  CREATININE 0.79  CALCIUM 9.2     DVT prophylaxis: Lovenox  Code Status: Full code  Family Communication: No family at bedside  Disposition Plan: likely home when medically ready for discharge   Consultants:  None   Procedures        None   Antibiotics:   Anti-infectives (From admission, onward)   Start     Dose/Rate Route Frequency Ordered Stop   08/15/18 2200  doxycycline (VIBRA-TABS) tablet 100 mg     100 mg Oral Every 12 hours 08/15/18 1948         Objective   Vitals:   08/16/18 0447 08/16/18 0814 08/16/18 0828 08/16/18 1458  BP: (!) 147/83     Pulse: 93     Resp: 20      Temp: 98.3 F (36.8 C)     TempSrc: Oral     SpO2: 99% 94% 94% 92%  Weight:      Height:        Intake/Output Summary (Last 24 hours) at 08/16/2018 1859 Last data filed at 08/16/2018 1434 Gross per 24 hour  Intake 890 ml  Output 1 ml  Net 889 ml   Filed Weights   08/15/18 1813 08/15/18 2355  Weight: 61.7 kg 61.9 kg     Physical Examination:    General: Appears in no acute distress  Cardiovascular: S1-S2, regular  Respiratory: Decreased breath sounds bilaterally  Abdomen: Soft, nontender, no organomegaly  Extremities: No edema in the lower extremities  Neurologic: Alert, oriented x3, no focal deficit noted     Data Reviewed: I have personally reviewed following labs and imaging studies   Recent Results (from the past 240 hour(s))  Blood Culture (routine x 2)     Status: None (Preliminary result)   Collection Time: 08/15/18  6:12 PM  Result Value Ref Range Status   Specimen Description   Final    BLOOD BLOOD LEFT FOREARM Performed at [facility] [place] [address] [place], [place] [address]    Special Requests   Final    BOTTLES DRAWN AEROBIC AND ANAEROBIC Blood Culture adequate volume Performed at [facility], [place] [address], [place], [place] [address]    Culture   Final    NO GROWTH < 24 HOURS Performed at [place] [address], [place], [place] [address]    Report Status PENDING  Incomplete  Blood Culture (routine x 2)     Status: None (Preliminary result)   Collection Time: 08/15/18  6:18 PM  Result Value Ref Range Status   Specimen Description   Final    BLOOD LEFT ANTECUBITAL Performed at [facility] [address], [place], [place] [address]    Special Requests   Final    BOTTLES DRAWN AEROBIC AND ANAEROBIC Blood Culture adequate volume Performed at [facility] [address], [place], [place] [address]    Culture   Final    NO GROWTH < 24  HOURS Performed at [place] [address], [place], [place] [address]    Report Status PENDING  Incomplete     Liver Function Tests: Recent Labs  Lab 08/15/18 1812  AST 25  ALT 17  ALKPHOS 63  BILITOT 1.0  PROT 6.6  ALBUMIN 3.7   No results for input(s): LIPASE, AMYLASE in the last 168 hours. No results for input(s): AMMONIA in the last 168 hours.  Cardiac Enzymes: No results for input(s): CKTOTAL, CKMB, CKMBINDEX, TROPONINI in the last 168 hours. BNP (last 3 results) Recent Labs    04/02/18 0400 07/26/18 1233  BNP 131.2* 338.0*    ProBNP (last 3 results) No results for input(s): PROBNP in the last 8760 hours.    Studies: Dg Chest Port 1 View  Result Date: 08/15/2018 CLINICAL DATA:  COPD exacerbation and hypercarbia 3 weeks ago. Two days ago patient started experiencing dyspnea on exertion. EXAM: PORTABLE CHEST 1 VIEW COMPARISON:  07/26/2018 FINDINGS: Stable heart and mediastinal contours with moderate aortic atherosclerosis at the arch. Nonaneurysmal appearance of the thoracic aorta is noted. Emphysematous bullous hyperinflation of the lungs, upper lobe predominant and right greater than left is redemonstrated without alveolar consolidation, pulmonary edema, effusion or pneumothorax. No acute nor concerning osseous abnormalities. 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 No active pulmonary disease. Electronically Signed   By: Ashley Royalty M.D.   On: 08/15/2018 18:59    Scheduled Meds: . arformoterol  15 mcg Nebulization BID  . bisoprolol  5 mg Oral Daily  . calcium-vitamin D  1 tablet Oral Daily  . clopidogrel  75 mg Oral Daily  . doxycycline  100 mg Oral Q12H  . enoxaparin (LOVENOX) injection  40 mg Subcutaneous Q24H  . fluticasone  2 spray Each Nare Daily  . guaiFENesin  600 mg Oral BID  . ipratropium-albuterol  3 mL Nebulization Q6H  . methylPREDNISolone (SOLU-MEDROL) injection  60 mg  Intravenous Q6H  . pantoprazole  40 mg Oral Q1200  . PARoxetine  20 mg Oral Daily  . pravastatin  40 mg Oral q1800    Admission status: Inpatient: Based on patients clinical presentation and evaluation of above clinical data, I have made determination that patient meets Inpatient criteria at this time.  Patient continues to require IV Solu-Medrol, breathing not at baseline, becomes hypoxic on ambulation with O2 sats dropped to 83% on 6 L of oxygen via nasal cannula.  Time spent: 20 minutes  [facility] Pager [phone]. If 7PM-7AM, please contact night-coverage at [email or web address], Office  [phone]  password [place]  08/16/2018, 6:59 PM  LOS: 0 days

## 2018-08-16 NOTE — Progress Notes (Signed)
Pt ambulated in room to bathroom on  6 l/min and oxygen saturation dropped to 83%. She became very SOB. She recovered quickly to 95% when rested. Eulas Post, RN

## 2018-08-16 NOTE — Telephone Encounter (Signed)
Received call from pt.  She is in [facility] room 1438.  Was seen by Tammy in office on 08/15/18.  Was to be admitted for COPD exacerbation.  No beds in hospital, so sent to ER.  Noted to have oxygen desaturation in hospital.  Will add to [facility] rounding list for PCCM and have patient seen by pulmonary on 08/17/18.

## 2018-08-16 NOTE — Progress Notes (Signed)
Pt asked that [facility] be cancelled.  A called was made to info in house rep.

## 2018-08-16 NOTE — Care Management Obs Status (Signed)
[facility] NOTIFICATION   Patient Details  Name: Kayla Mack MRN: [id number] Date of Birth: 06-19-1946   Medicare Observation Status Notification Given:   yes    Purcell Mouton, RN 08/16/2018, 2:45 PM

## 2018-08-16 NOTE — Progress Notes (Signed)
Nutrition Brief Note  RD consulted via COPD protocol.  Pt has been steadily gaining weight since 2018.   Wt Readings from Last 15 Encounters:  08/15/18 61.9 kg  08/15/18 62.2 kg  08/07/18 62.1 kg  05/29/18 61.7 kg  05/11/18 59 kg  04/10/18 60.6 kg  04/02/18 61.4 kg  01/25/18 59 kg  12/29/17 58.8 kg  10/20/17 57.8 kg  10/13/17 57.8 kg  10/06/17 56.5 kg  09/29/17 56.3 kg  09/22/17 56.6 kg  09/20/17 56.3 kg    Body mass index is 24.97 kg/m. Patient meets criteria for normal based on current BMI.   Current diet order is heart healthy. Labs and medications reviewed.   No nutrition interventions warranted at this time. If nutrition issues arise, please consult RD.   Clayton Bibles, MS, RD, [place] Dietitian Pager: [phone] After Hours Pager: [phone]

## 2018-08-16 NOTE — Telephone Encounter (Signed)
Called patient unable to reach left message to give [place] a call back.

## 2018-08-17 ENCOUNTER — Encounter (HOSPITAL_COMMUNITY): Admission: RE | Admit: 2018-08-17 | Payer: Self-pay | Source: Ambulatory Visit

## 2018-08-17 DIAGNOSIS — I10 Essential (primary) hypertension: Secondary | ICD-10-CM

## 2018-08-17 DIAGNOSIS — E785 Hyperlipidemia, unspecified: Secondary | ICD-10-CM

## 2018-08-17 LAB — URINE CULTURE: Culture: <10000 — AB

## 2018-08-17 NOTE — Consult Note (Addendum)
[facility] CM Inpatient Consult   08/17/2018  Kayla Mack February 08, 1946 [id number]    Patient screened for potential [facility] Care Management services due to hospital readmission.   Went to bedside to speak with [name] about [facility] Management re-engagement. She is agreeable to [facility] follow up for COPD and [facility] Social Work follow up for transportation concerns. Active [facility] Care Management consent remains on file. Another [facility] brochure and 24-hr nurse advice line magnet provided.  [name] was previously active with [facility]. She states she does not need [facility] services any longer. Explained [facility] [place] [facility] Care Management will not interfere or replace home health services.   [name] lives alone. She has supportive friends. However, she states her friends sometimes cannot take her to MD appointments. Hence, referral for transportation assistance. Confirmed Primary Care MD is Dr. Jani Gravel ([place] [facility] listed as doing transition of care call). Confirmed best contact number as [phone].  [name] states "I need someone to help me understand when I am getting into trouble and recognize the signs with my COPD".   Made inpatient RNCM aware [facility] will follow post discharge.  Will make referral to [facility] Social Worker.     Marthenia Rolling, MSN-Ed, RN,BSN [facility] Liaison [phone]

## 2018-08-17 NOTE — Progress Notes (Signed)
Patient visited socially, needs IV steroids and abx for COPD exacerbation  PCCM will be available as needed.  Rush Farmer, M.D. [facility] Pulmonary/Critical Care Medicine. Pager: [phone]. After hours pager: [phone].

## 2018-08-17 NOTE — Progress Notes (Signed)
SATURATION QUALIFICATIONS: (This note is used to comply with regulatory documentation for home oxygen)  Patient Saturations on Room Air at Rest = 96%- pt using 6L  Patient Saturations on Room Air while Ambulating = 81% on 8L  Patient Saturations on 8 Liters of oxygen while Ambulating = 81%  Please briefly explain why patient needs home oxygen: PT desaturated early in the morning to low 70's per pt. MD wanted RN to put pt on 8L and ambulate. 8L O2 was 81%

## 2018-08-17 NOTE — Progress Notes (Signed)
[facility]  PROGRESS NOTE  Kayla Mack ZMO:[id number] DOB: 1945/11/28 DOA: 08/15/2018 PCP: Jani Gravel, MD   Brief HPI:   72 year old female with a history of chronic respiratory failure due to COPD on 4 to 6 L of oxygen via nasal cannula, hypertension, hyperlipidemia, depression with anxiety, pulmonary hypertension came to the hospital with shortness of breath and cough.  Patient treated with COPD exacerbation   Subjective   Patient seen and examined, continues to be hypoxic on 6 L of oxygen on ambulation   Assessment/Plan:     1. Acute on chronic hypoxic respiratory failure-slowly improving, noted baseline he will become hypoxic on ambulation on 6 L of oxygen via nasal,.  Secondary to COPD exacerbation, continue Solu-Medrol 60 mg IV every 6 hours, DuoNeb nebulizer every 6 hours, Mucinex 600 mg p.o. twice daily.  Continue antibiotics doxycycline.  2. Hypertension-blood pressure is controlled, continue Zebeta  3. Hyperlipidemia-continue pravastatin  4. Depression/anxiety-stable, no suicidal or homicidal ideation, continue Paxil, Klonopin  5. GERD-started on Protonix, Mylanta as needed     CBC: Recent Labs  Lab 08/15/18 1812  WBC 9.2  NEUTROABS 7.1  HGB 10.7*  HCT 35.1*  MCV 96.2  PLT 035    Basic Metabolic Panel: Recent Labs  Lab 08/15/18 1812  NA 138  K 4.5  CL 103  CO2 26  GLUCOSE 108*  BUN 21  CREATININE 0.79  CALCIUM 9.2     DVT prophylaxis: Lovenox  Code Status: Full code  Family Communication: No family at bedside  Disposition Plan: likely home when medically ready for discharge   Consultants:  None   Procedures        None   Antibiotics:   Anti-infectives (From admission, onward)   Start     Dose/Rate Route Frequency Ordered Stop   08/15/18 2200  doxycycline (VIBRA-TABS) tablet 100 mg     100 mg Oral Every 12 hours 08/15/18 1948         Objective   Vitals:   08/17/18 0834 08/17/18 0954 08/17/18 1300 08/17/18  1406  BP:  (!) 143/70 137/82   Pulse:  (!) 102 90   Resp:  20 20   Temp:  98.2 F (36.8 C) 98.6 F (37 C)   TempSrc:  Oral Oral   SpO2: 96% 96% 95% 96%  Weight:  61.9 kg    Height:  5' 1.75" (1.568 m)      Intake/Output Summary (Last 24 hours) at 08/17/2018 1458 Last data filed at 08/17/2018 1345 Gross per 24 hour  Intake 1020 ml  Output -  Net 1020 ml   Filed Weights   08/15/18 1813 08/15/18 2355 08/17/18 0954  Weight: 61.7 kg 61.9 kg 61.9 kg     Physical Examination:    Neck: Supple, no deformities, masses, or tenderness Lungs: Normal respiratory effort, decreased breath sounds at lung bases Heart: Regular rate and rhythm, S1 and S2 normal, no murmurs, rubs auscultated Abdomen: BS normoactive,soft,nondistended,non-tender to palpation,no organomegaly Extremities: No pretibial edema, no erythema, no cyanosis, no clubbing Neuro : Alert and oriented to time, place and person, No focal deficits     Data Reviewed: I have personally reviewed following labs and imaging studies   Recent Results (from the past 240 hour(s))  Blood Culture (routine x 2)     Status: None (Preliminary result)   Collection Time: 08/15/18  6:12 PM  Result Value Ref Range Status   Specimen Description   Final    BLOOD BLOOD LEFT FOREARM  Performed at [facility], [place] [address], [place], [place] [address]    Special Requests   Final    BOTTLES DRAWN AEROBIC AND ANAEROBIC Blood Culture adequate volume Performed at [facility] [address], [place], [place] [address]    Culture   Final    NO GROWTH 2 DAYS Performed at [facility] [address] [place], [place] [address]    Report Status PENDING  Incomplete  Blood Culture (routine x 2)     Status: None (Preliminary result)   Collection Time: 08/15/18  6:18 PM  Result Value Ref Range Status   Specimen Description   Final    BLOOD LEFT ANTECUBITAL Performed at [facility] [address], [place], [place] [address]    Special Requests   Final    BOTTLES DRAWN AEROBIC AND ANAEROBIC Blood Culture adequate volume Performed at [facility] [address] [place], [place] [address]    Culture   Final    NO GROWTH 2 DAYS Performed at [facility] [address], [place], [place] [address]    Report Status PENDING  Incomplete  Urine culture     Status: Abnormal   Collection Time: 08/15/18  8:25 PM  Result Value Ref Range Status   Specimen Description   Final    URINE, RANDOM Performed at [facility] [address] [place], [place] [address]    Special Requests   Final    NONE Performed at [facility], [place] [address] [place], [place] [address]    Culture (A)  Final    <10,000 COLONIES/mL INSIGNIFICANT GROWTH Performed at [facility] [address] [place], [place] [address]    Report Status 08/17/2018 FINAL  Final     Liver Function Tests: Recent Labs  Lab 08/15/18 1812  AST 25  ALT 17  ALKPHOS 63  BILITOT 1.0  PROT 6.6  ALBUMIN 3.7   No results for input(s): LIPASE, AMYLASE in the last 168 hours. No results for input(s): AMMONIA in the last 168 hours.  Cardiac Enzymes: No results for input(s): CKTOTAL, CKMB, CKMBINDEX, TROPONINI in the last 168 hours. BNP (last 3 results) Recent Labs    04/02/18 0400 07/26/18 1233  BNP 131.2* 338.0*    ProBNP (last 3 results) No results for input(s): PROBNP in the last 8760 hours.    Studies: Dg Chest Port 1 View  Result Date: 08/15/2018 CLINICAL DATA:  COPD exacerbation and hypercarbia 3 weeks ago. Two days ago patient started experiencing dyspnea on exertion. EXAM: PORTABLE CHEST 1 VIEW COMPARISON:  07/26/2018 FINDINGS: Stable heart and mediastinal contours with moderate aortic atherosclerosis at the arch. Nonaneurysmal appearance of the thoracic aorta is noted. Emphysematous bullous hyperinflation of the  lungs, upper lobe predominant and right greater than left is redemonstrated without alveolar consolidation, pulmonary edema, effusion or pneumothorax. No acute nor concerning osseous abnormalities. 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 No active pulmonary disease. Electronically Signed   By: Ashley Royalty M.D.   On: 08/15/2018 18:59    Scheduled Meds: . arformoterol  15 mcg Nebulization BID  . bisoprolol  5 mg Oral Daily  . calcium-vitamin D  1 tablet Oral Daily  . clopidogrel  75 mg Oral Daily  . doxycycline  100 mg Oral Q12H  . enoxaparin (LOVENOX) injection  40 mg Subcutaneous Q24H  . fluticasone  2  spray Each Nare Daily  . guaiFENesin  600 mg Oral BID  . ipratropium-albuterol  3 mL Nebulization Q6H  . methylPREDNISolone (SOLU-MEDROL) injection  60 mg Intravenous Q6H  . pantoprazole  40 mg Oral Q1200  . PARoxetine  20 mg Oral Daily  . pravastatin  40 mg Oral q1800    Admission status: Inpatient: Based on patients clinical presentation and evaluation of above clinical data, I have made determination that patient meets Inpatient criteria at this time.  Patient continues to require IV Solu-Medrol, breathing not at baseline, becomes hypoxic on ambulation with O2 sats dropped to 83% on 6 L of oxygen via nasal cannula.  Time spent: 20 minutes  [facility] Pager [phone]. If 7PM-7AM, please contact night-coverage at [email or web address], Office  [phone]  password [place]  08/17/2018, 2:58 PM  LOS: 1 day

## 2018-08-18 MED ORDER — IPRATROPIUM-ALBUTEROL 0.5-2.5 (3) MG/3ML IN SOLN
3.0000 mL | Freq: Three times a day (TID) | RESPIRATORY_TRACT | Status: DC
  Administered 2018-08-19: 3 mL via RESPIRATORY_TRACT
  Filled 2018-08-18: qty 3

## 2018-08-18 NOTE — Progress Notes (Signed)
[facility]  PROGRESS NOTE  Kayla Mack WGY:[id number] DOB: [birth year] DOA: 08/15/2018 PCP: Jani Gravel, MD   Brief HPI:   72 year old female with a history of chronic respiratory failure due to COPD on 4 to 6 L of oxygen via nasal cannula, hypertension, hyperlipidemia, depression with anxiety, pulmonary hypertension came to the hospital with shortness of breath and cough.  Patient treated with COPD exacerbation   Subjective   Patient seen and examined, still oxygen saturation drops to 87% on 8 L of oxygen on ambulation.   Assessment/Plan:     1. Acute on chronic hypoxic respiratory failure-slowly improving, noted baseline he will become hypoxic on ambulation on 8 L of oxygen via nasal,.  Secondary to COPD exacerbation, continue Solu-Medrol 60 mg IV every 6 hours, DuoNeb nebulizer every 6 hours, Mucinex 600 mg p.o. twice daily.  Continue antibiotics doxycycline.  2. Hypertension-blood pressure is controlled, continue Zebeta  3. Hyperlipidemia-continue pravastatin  4. Depression/anxiety-stable, no suicidal or homicidal ideation, continue Paxil, Klonopin  5. GERD-started on Protonix, Mylanta as needed     CBC: Recent Labs  Lab 08/15/18 1812  WBC 9.2  NEUTROABS 7.1  HGB 10.7*  HCT 35.1*  MCV 96.2  PLT 779    Basic Metabolic Panel: Recent Labs  Lab 08/15/18 1812  NA 138  K 4.5  CL 103  CO2 26  GLUCOSE 108*  BUN 21  CREATININE 0.79  CALCIUM 9.2     DVT prophylaxis: Lovenox  Code Status: Full code  Family Communication: Discussed with patient's niece at bedside  Disposition Plan: likely home when medically ready for discharge   Consultants:  None   Procedures        None   Antibiotics:   Anti-infectives (From admission, onward)   Start     Dose/Rate Route Frequency Ordered Stop   08/15/18 2200  doxycycline (VIBRA-TABS) tablet 100 mg     100 mg Oral Every 12 hours 08/15/18 1948         Objective   Vitals:   08/18/18 0209  08/18/18 0534 08/18/18 0750 08/18/18 1408  BP:  (!) 149/86    Pulse:  85    Resp:  14    Temp:  98.8 F (37.1 C)    TempSrc:  Oral    SpO2: 96% 96% 97% 94%  Weight:      Height:        Intake/Output Summary (Last 24 hours) at 08/18/2018 1612 Last data filed at 08/18/2018 1500 Gross per 24 hour  Intake 1542 ml  Output -  Net 1542 ml   Filed Weights   08/15/18 1813 08/15/18 2355 08/17/18 0954  Weight: 61.7 kg 61.9 kg 61.9 kg     Physical Examination:     Neck: Supple, no deformities, masses, or tenderness Lungs: Normal respiratory effort, decreased breath sounds bilaterally Heart: Regular rate and rhythm, S1 and S2 normal, no murmurs, rubs auscultated Abdomen: BS normoactive,soft,nondistended,non-tender to palpation,no organomegaly Extremities: No pretibial edema, no erythema, no cyanosis, no clubbing Neuro : Alert and oriented to time, place and person, No focal deficits Skin: No rashes seen on exam     Data Reviewed: I have personally reviewed following labs and imaging studies   Recent Results (from the past 240 hour(s))  Blood Culture (routine x 2)     Status: None (Preliminary result)   Collection Time: 08/15/18  6:12 PM  Result Value Ref Range Status   Specimen Description   Final    BLOOD BLOOD LEFT  FOREARM Performed at [facility], [place] [address], [place], [place] [address]    Special Requests   Final    BOTTLES DRAWN AEROBIC AND ANAEROBIC Blood Culture adequate volume Performed at [facility] [address], [place], [place] [address]    Culture   Final    NO GROWTH 3 DAYS Performed at [facility], [place] [address] [place], [place] [address]    Report Status PENDING  Incomplete  Blood Culture (routine x 2)     Status: None (Preliminary result)   Collection Time: 08/15/18  6:18 PM  Result Value Ref Range Status   Specimen Description   Final    BLOOD LEFT ANTECUBITAL Performed at [facility] [address] [place], [place] [address]    Special Requests   Final    BOTTLES DRAWN AEROBIC AND ANAEROBIC Blood Culture adequate volume Performed at [facility] [address] [place], [place] [address]    Culture   Final    NO GROWTH 3 DAYS Performed at [facility], [place] [address] [place], [place] [address]    Report Status PENDING  Incomplete  Urine culture     Status: Abnormal   Collection Time: 08/15/18  8:25 PM  Result Value Ref Range Status   Specimen Description   Final    URINE, RANDOM Performed at [facility] [address], [place], [place] [address]    Special Requests   Final    NONE Performed at [facility], [place] [address] [place], [place] [address]    Culture (A)  Final    <10,000 COLONIES/mL INSIGNIFICANT GROWTH Performed at [facility] [address], [place],  [address]    Report Status 08/17/2018 FINAL  Final     Liver Function Tests: Recent Labs  Lab 08/15/18 1812  AST 25  ALT 17  ALKPHOS 63  BILITOT 1.0  PROT 6.6  ALBUMIN 3.7   No results for input(s): LIPASE, AMYLASE in the last 168 hours. No results for input(s): AMMONIA in the last 168 hours.  Cardiac Enzymes: No results for input(s): CKTOTAL, CKMB, CKMBINDEX, TROPONINI in the last 168 hours. BNP (last 3 results) Recent Labs    04/02/18 0400 07/26/18 1233  BNP 131.2* 338.0*    ProBNP (last 3 results) No results for input(s): PROBNP in the last 8760 hours.    Studies: No results found.  Scheduled Meds: . arformoterol  15 mcg Nebulization BID  . bisoprolol  5 mg Oral Daily  . calcium-vitamin D  1 tablet Oral Daily  . clopidogrel  75 mg Oral Daily  . doxycycline  100 mg Oral Q12H  . enoxaparin (LOVENOX) injection  40 mg Subcutaneous Q24H  . fluticasone  2 spray Each Nare Daily  . guaiFENesin  600 mg Oral BID  . ipratropium-albuterol  3 mL Nebulization Q6H  . methylPREDNISolone  (SOLU-MEDROL) injection  60 mg Intravenous Q6H  . pantoprazole  40 mg Oral Q1200  . PARoxetine  20 mg Oral Daily  . pravastatin  40 mg Oral q1800    Admission status: Inpatient: Based on patients clinical presentation and evaluation of above clinical data, I have made determination that patient meets Inpatient criteria at this time.  Patient continues to require IV Solu-Medrol, breathing not at baseline, becomes hypoxic on ambulation with O2 sats dropped to 87% on 8 L of oxygen via nasal cannula.  Time spent: 20  minutes  [facility] Pager [phone]. If 7PM-7AM, please contact night-coverage at [email or web address], Office  [phone]  password [place]  08/18/2018, 4:12 PM  LOS: 2 days

## 2018-08-18 NOTE — Progress Notes (Signed)
Received report from [place], Therapist, sports. I agree with previous assessment. Patient with no new complaints. Will continue to monitor closely.

## 2018-08-18 NOTE — Care Management Note (Signed)
Case Management Note  Patient Details  Name: YANA SCHORR MRN: [id number] Date of Birth: 28-Jul-1946  Subjective/Objective:                    Action/Plan: Pt did not want to continue with [facility]. [facility] for [facility].    Expected Discharge Date:  (unknown)               Expected Discharge Plan:  [place]  In-House Referral:     Discharge planning Services  CM Consult  Post Acute Care Choice:    Choice offered to:  Patient  DME Arranged:    DME Agency:     HH Arranged:  RN [place] Agency:  [facility]  Status of Service:  Completed, signed off  If discussed at [place] of Stay Meetings, dates discussed:    Additional CommentsPurcell Mouton, RN 08/18/2018, 12:14 PM

## 2018-08-18 NOTE — Progress Notes (Signed)
SATURATION QUALIFICATIONS: (This note is used to comply with regulatory documentation for home oxygen)  Patient Saturations on Room Air at Rest =  94 on 6L @ rest  Patient Saturations on Room Air while Ambulating =   Patient Saturations on 8 Liters of oxygen while Ambulating = 87%  Please briefly explain why patient needs home oxygen:

## 2018-08-19 MED ORDER — IPRATROPIUM-ALBUTEROL 0.5-2.5 (3) MG/3ML IN SOLN: 3.0000 mL | Freq: Every day | RESPIRATORY_TRACT | Status: DC

## 2018-08-19 MED ORDER — PREDNISONE 10 MG PO TABS: 10.0000 mg | ORAL_TABLET | Freq: Every day | ORAL | 0 refills | Status: DC

## 2018-08-19 NOTE — Progress Notes (Signed)
SATURATION QUALIFICATIONS: (This note is used to comply with regulatory documentation for home oxygen)  Patient Saturations on Room Air at Rest = n/a  Patient Saturations on Room Air while Ambulating = n/a   Patient Saturations on 8 Liters of oxygen while Ambulating = 88-90%  Please briefly explain why patient needs home oxygen: patient on home oxygen at baseline, 94% on 6 liters at rest

## 2018-08-19 NOTE — Discharge Summary (Signed)
Physician Discharge Summary  Kayla Mack FHL:[id number] DOB: [birth year] DOA: 08/15/2018  PCP: Jani Gravel, MD  Admit date: 08/15/2018 Discharge date: 08/19/2018  Time spent: 40 minutes  Recommendations for Outpatient Follow-up:  1. Follow up PCP in 2 weeks   Discharge Diagnoses:  Principal Problem:   COPD exacerbation ([place]) Active Problems:   Hypertension   Hyperlipidemia   Depression with anxiety   Acute on chronic respiratory failure with hypoxia (HCC)   GERD (gastroesophageal reflux disease)   COPD with acute exacerbation [facility])   Discharge Condition: Stable  Diet recommendation: Heart healthy diet  Filed Weights   08/15/18 1813 08/15/18 2355 08/17/18 0954  Weight: 61.7 kg 61.9 kg 61.9 kg    History of present illness:   72 year old female with a history of chronic respiratory failure due to COPD on 4 to 6 L of oxygen via nasal cannula, hypertension, hyperlipidemia, depression with anxiety, pulmonary hypertension came to the hospital with shortness of breath and cough.  Patient treated with COPD exacerbation  Hospital Course:   1. Acute on chronic hypoxic respiratory failure- improved, back to baseline.   Secondary to COPD exacerbation, patient was started on Solu-Medrol 60 mg IV every 6 hours, will send him on po prednisone taper. DuoNeb nebulizer every 6 hours, Mucinex 600 mg p.o. twice daily. Completed 5 days of Doxycycline.  2. Hypertension-blood pressure is controlled, continue Zebeta.  3. Hyperlipidemia-continue pravastatin.  4. Depression/anxiety-stable, no suicidal or homicidal ideation, continue Paxil, Klonopin  5. GERD-started on Protonix, Mylanta as needed  Procedures:  None   Consultations:  None   Discharge Exam: Vitals:   08/19/18 0537 08/19/18 0740  BP: (!) 150/88   Pulse: 70 74  Resp: 16 17  Temp: 97.6 F (36.4 C)   SpO2: 100% 98%    General: Appears in no acute distress Cardiovascular: S1S2 RRR Respiratory: Decreased  breath sounds bilaterally  Discharge Instructions   Discharge Instructions    AMB Referral to [place] Management   Complete by:  As directed    Please assign to [facility] for COPD and complex case management and [facility] Social Worker for transportation needs. Active consent remains on file. PCP [facility]) listed as doing toc. Currently at [facility]. Please call with questions. Thanks. Marthenia Rolling, [place], RN,BSN-THN [facility] [id number]-[phone]   Reason for consult:  Please assign to [place] and [facility] Social Worker   Diagnoses of:  COPD/ Pneumonia   Expected date of contact:  1-3 days (reserved for hospital discharges)   Diet - low sodium heart healthy   Complete by:  As directed    Increase activity slowly   Complete by:  As directed      Allergies as of 08/19/2018      Reactions   Aleve [naproxen Sodium] Hives   Aspirin Swelling   Facial swelling   Penicillins Hives, Swelling   Has patient had a PCN reaction causing immediate rash, facial/tongue/throat swelling, SOB or lightheadedness with hypotension: Yes Has patient had a PCN reaction causing severe rash involving mucus membranes or skin necrosis: Yes Has patient had a PCN reaction that required hospitalization: Unk Has patient had a PCN reaction occurring within the last 10 years: No If all of the above answers are "NO", then may proceed with Cephalosporin use.      Medication List    TAKE these medications   acetaminophen 500 MG tablet Commonly known as:  TYLENOL Take 1,000 mg by mouth 2 (two) times daily  as needed for pain.   albuterol (2.5 MG/3ML) 0.083% nebulizer solution Commonly known as:  PROVENTIL Take 3 mLs (2.5 mg total) by nebulization every 4 (four) hours as needed for wheezing or shortness of breath. What changed:  Another medication with the same name was removed. Continue taking this medication, and follow the directions you see here.   arformoterol 15 MCG/2ML  Nebu Commonly known as:  BROVANA Take 2 mLs (15 mcg total) by nebulization 2 (two) times daily. Dx: J44.9   bisoprolol 5 MG tablet Commonly known as:  ZEBETA Take 1 tablet (5 mg total) by mouth daily.   budesonide 0.5 MG/2ML nebulizer solution Commonly known as:  PULMICORT Take 2 mLs (0.5 mg total) by nebulization 2 (two) times daily. Dx: J44.9   calcium-vitamin D 500-200 MG-UNIT tablet Commonly known as:  OSCAL WITH D Take 1 tablet by mouth 2 (two) times daily. What changed:  when to take this   clonazePAM 0.5 MG tablet Commonly known as:  KLONOPIN Take 1 tablet (0.5 mg total) by mouth 2 (two) times daily as needed for anxiety. What changed:    when to take this  reasons to take this   clopidogrel 75 MG tablet Commonly known as:  PLAVIX Take 1 tablet (75 mg total) by mouth daily.   diphenhydrAMINE 25 mg capsule Commonly known as:  BENADRYL Take 25 mg by mouth every 6 (six) hours as needed for allergies.   FIRST-DUKES MOUTHWASH Susp Gargle and swallow 1 tsp four times daily   fluticasone 50 MCG/ACT nasal spray Commonly known as:  FLONASE Place 2 sprays into both nostrils daily.   guaiFENesin 600 MG 12 hr tablet Commonly known as:  MUCINEX Take 1 tablet (600 mg total) by mouth 2 (two) times daily.   ipratropium-albuterol 0.5-2.5 (3) MG/3ML Soln Commonly known as:  DUONEB Take 3 mLs by nebulization daily. Dx: J44.9   loratadine 10 MG tablet Commonly known as:  CLARITIN Take 1 tablet (10 mg total) by mouth daily. What changed:    when to take this  reasons to take this   lovastatin 40 MG tablet Commonly known as:  MEVACOR Take 40 mg by mouth every morning.   PARoxetine 20 MG tablet Commonly known as:  PAXIL Take 20 mg by mouth daily.   predniSONE 10 MG tablet Commonly known as:  DELTASONE Take 1 tablet (10 mg total) by mouth daily. Prednisone 40 mg po daily x 1 day then Prednisone 30 mg po daily x 1 day then Prednisone 20 mg po daily x 1 day  then Prednisone 10 mg daily x 1 day then stop...   sodium chloride 0.65 % Soln nasal spray Commonly known as:  OCEAN Place 1 spray into both nostrils as needed for congestion.      Allergies  Allergen Reactions  . Aleve [Naproxen Sodium] Hives  . Aspirin Swelling    Facial swelling  . Penicillins Hives and Swelling    Has patient had a PCN reaction causing immediate rash, facial/tongue/throat swelling, SOB or lightheadedness with hypotension: Yes Has patient had a PCN reaction causing severe rash involving mucus membranes or skin necrosis: Yes Has patient had a PCN reaction that required hospitalization: Unk Has patient had a PCN reaction occurring within the last 10 years: No If all of the above answers are "NO", then may proceed with Cephalosporin use.    Follow-up Information    [facility] Follow up.   Specialty:  Home Health Services Why:  [facility], will call you within 24 to 48 hours after discharge for an appointment to come to your home.  Contact information: [address] [place] [address] [phone]            The results of significant diagnostics from this hospitalization (including imaging, microbiology, ancillary and laboratory) are listed below for reference.    Significant Diagnostic Studies: Dg Chest Port 1 View  Result Date: 08/15/2018 CLINICAL DATA:  COPD exacerbation and hypercarbia 3 weeks ago. Two days ago patient started experiencing dyspnea on exertion. EXAM: PORTABLE CHEST 1 VIEW COMPARISON:  07/26/2018 FINDINGS: Stable heart and mediastinal contours with moderate aortic atherosclerosis at the arch. Nonaneurysmal appearance of the thoracic aorta is noted. Emphysematous bullous hyperinflation of the lungs, upper lobe predominant and right greater than left is redemonstrated without alveolar consolidation, pulmonary edema, effusion or pneumothorax. No acute nor concerning osseous abnormalities. IMPRESSION: Upper lobe  predominant bullous emphysematous hyperinflation of the lungs consistent with COPD. No active pulmonary disease. Electronically Signed   By: Ashley Royalty M.D.   On: 08/15/2018 18:59   Dg Chest Portable 1 View  Result Date: 07/26/2018 CLINICAL DATA:  Respiratory distress, history of COPD, trauma. EXAM: PORTABLE CHEST 1 VIEW COMPARISON:  Portable chest x-ray of April 02, 2018 FINDINGS: The lungs remain hyperinflated. The interstitial markings remain increased in the mid and lower lungs bilaterally. Bullous lesions in the apices bilaterally are stable. The heart and pulmonary vascularity are normal. There is calcification in the wall of the aortic arch. There is no pleural effusion or pneumothorax. IMPRESSION: Bullous emphysema. Increased interstitial markings in the mid and lower lungs is chronic but superimposed acute bronchitis may be present. No CHF. Thoracic aortic atherosclerosis. Electronically Signed   By: David  Martin[place] M.D.   On: 07/26/2018 13:03    Microbiology: Recent Results (from the past 240 hour(s))  Blood Culture (routine x 2)     Status: None (Preliminary result)   Collection Time: 08/15/18  6:12 PM  Result Value Ref Range Status   Specimen Description   Final    BLOOD BLOOD LEFT FOREARM Performed at [facility] [address] [place], [place] [address]    Special Requests   Final    BOTTLES DRAWN AEROBIC AND ANAEROBIC Blood Culture adequate volume Performed at [facility] [address], [place], [place] [address]    Culture   Final    NO GROWTH 4 DAYS Performed at [facility], [place] [address], [place], [place] [address]    Report Status PENDING  Incomplete  Blood Culture (routine x 2)     Status: None (Preliminary result)   Collection Time: 08/15/18  6:18 PM  Result Value Ref Range Status   Specimen Description   Final    BLOOD LEFT ANTECUBITAL Performed at [facility] [address] [place],  [place] [address]    Special Requests   Final    BOTTLES DRAWN AEROBIC AND ANAEROBIC Blood Culture adequate volume Performed at [facility] [address] [place], [place] [address]    Culture   Final    NO GROWTH 4 DAYS Performed at [facility], [place] [address] [place],  [address]    Report Status PENDING  Incomplete  Urine culture     Status: Abnormal   Collection Time: 08/15/18  8:25 PM  Result Value Ref Range Status   Specimen Description   Final  URINE, RANDOM Performed at [facility], [place] [address], [place], [place] [address]    Special Requests   Final    NONE Performed at [facility], [place] [address] [place], [place] [address]    Culture (A)  Final    <10,000 COLONIES/mL INSIGNIFICANT GROWTH Performed at [facility] [address], [place], [place] [address]    Report Status 08/17/2018 FINAL  Final     Labs: Basic Metabolic Panel: Recent Labs  Lab 08/15/18 1812  NA 138  K 4.5  CL 103  CO2 26  GLUCOSE 108*  BUN 21  CREATININE 0.79  CALCIUM 9.2   Liver Function Tests: Recent Labs  Lab 08/15/18 1812  AST 25  ALT 17  ALKPHOS 63  BILITOT 1.0  PROT 6.6  ALBUMIN 3.7   CBC: Recent Labs  Lab 08/15/18 1812  WBC 9.2  NEUTROABS 7.1  HGB 10.7*  HCT 35.1*  MCV 96.2  PLT 297    BNP (last 3 results) Recent Labs    04/02/18 0400 07/26/18 1233  BNP 131.2* 338.0*      Signed:  Oswald Hillock MD.  [facility] 08/19/2018, 10:52 AM

## 2018-08-19 NOTE — Care Management Note (Signed)
Case Management Note  Patient Details  Name: Kayla Mack MRN: [id number] Date of Birth: Sep 19, 1946  Subjective/Objective:   COPD exac                 Action/Plan: NCM spoke to pt's niece with pt's permission. Pt lives alone. Confirmed she did not want [facility] for [facility]. She had [facility] program in the past. Pt has contact number for [facility] rep, Dorian Pod to cal to arrange visit for tomorrow. Pt has oxygen with [employer]. Niece will call on Monday to arrange getting a different light weight portable tank. She has RW. Her Neurologist is going to work on getting her a Hoveround at home.    Expected Discharge Date:  08/19/18               Expected Discharge Plan:  [place]  In-House Referral:  NA  Discharge planning Services  CM Consult  Post Acute Care Choice:  Home Health Choice offered to:  Patient  DME Arranged:  N/A DME Agency:  NA  HH Arranged:  RN [place] Agency:  [facility]  Status of Service:  Completed, signed off  If discussed at [place] of Stay Meetings, dates discussed:    Additional Comments:  Erenest Rasher, RN 08/19/2018, 11:48 AM

## 2018-08-19 NOTE — Plan of Care (Signed)
Discharge instructions reviewed with patient and her niece, questions answered, verbalized understanding.  Patient transported to main entrance of hospital to be taken home by niece with her oxygen tank and all belongings in her possession.

## 2018-08-20 DIAGNOSIS — E785 Hyperlipidemia, unspecified: Secondary | ICD-10-CM | POA: Diagnosis not present

## 2018-08-20 DIAGNOSIS — Z9981 Dependence on supplemental oxygen: Secondary | ICD-10-CM | POA: Diagnosis not present

## 2018-08-20 DIAGNOSIS — Z87891 Personal history of nicotine dependence: Secondary | ICD-10-CM | POA: Diagnosis not present

## 2018-08-20 DIAGNOSIS — Z7951 Long term (current) use of inhaled steroids: Secondary | ICD-10-CM | POA: Diagnosis not present

## 2018-08-20 DIAGNOSIS — J441 Chronic obstructive pulmonary disease with (acute) exacerbation: Secondary | ICD-10-CM | POA: Diagnosis not present

## 2018-08-20 DIAGNOSIS — F418 Other specified anxiety disorders: Secondary | ICD-10-CM | POA: Diagnosis not present

## 2018-08-20 DIAGNOSIS — J9611 Chronic respiratory failure with hypoxia: Secondary | ICD-10-CM | POA: Diagnosis not present

## 2018-08-20 DIAGNOSIS — F329 Major depressive disorder, single episode, unspecified: Secondary | ICD-10-CM | POA: Diagnosis not present

## 2018-08-20 DIAGNOSIS — I2510 Atherosclerotic heart disease of native coronary artery without angina pectoris: Secondary | ICD-10-CM | POA: Diagnosis not present

## 2018-08-20 DIAGNOSIS — I10 Essential (primary) hypertension: Secondary | ICD-10-CM | POA: Diagnosis not present

## 2018-08-20 DIAGNOSIS — I272 Pulmonary hypertension, unspecified: Secondary | ICD-10-CM | POA: Diagnosis not present

## 2018-08-20 DIAGNOSIS — Z9181 History of falling: Secondary | ICD-10-CM | POA: Diagnosis not present

## 2018-08-20 LAB — CULTURE, BLOOD (ROUTINE X 2)
Culture: NO GROWTH
Culture: NO GROWTH
Special Requests: ADEQUATE
Special Requests: ADEQUATE

## 2018-08-20 NOTE — Telephone Encounter (Signed)
  This encounter was created in error - please disregard.

## 2018-08-21 ENCOUNTER — Other Ambulatory Visit: Payer: Self-pay

## 2018-08-21 ENCOUNTER — Telehealth: Payer: Self-pay | Admitting: Emergency Medicine

## 2018-08-21 DIAGNOSIS — J9611 Chronic respiratory failure with hypoxia: Secondary | ICD-10-CM

## 2018-08-21 DIAGNOSIS — J432 Centrilobular emphysema: Secondary | ICD-10-CM

## 2018-08-21 NOTE — Telephone Encounter (Signed)
Belenda Cruise ([facility] [phone]) requesting orders; requesting patient to receive skilled nursing care (2 week at one & 1 week at six); requesting patient to have physical and occupational therapy for evaluation and treatment; asking that Cardiac Rehab be on hold based on patient health condition; and would like to speak to nurse to clarify medications that the patient have in their possession

## 2018-08-21 NOTE — Telephone Encounter (Signed)
Spoke with Joellen Jersey at [facility], who is requesting that we place an order to d/c cardiac rehab, and replace it with home health skilled nursing, home health PT, and home health OT.  Order needs to be faxed to [facility]: fax: [phone] attn: Herma Mering RN   RB please advise if ok to place order.  Thanks!

## 2018-08-21 NOTE — Telephone Encounter (Signed)
Order placed as requested.  Nothing further needed at this time.

## 2018-08-21 NOTE — Patient Outreach (Signed)
[facility]) Care Management  08/21/2018  Kayla Mack [place] 10-Feb-1946 [id number]   RNCM called to follow up. Client confirmed two patient identifiers. She states this is not a good time to talk. She reports she is having a problem with her oxygen compressor. [name] states she has called [employer] and states they are sending someone to check on it. She states she is using her portable oxygen and has plenty of extra portable tanks.   RNCM called [employer] and spoke with Gilda who confirmed that she has already called a Merchant navy officer and someone is scheduled to go out to client's home.   Plan: follow up with client within the next 3-4 business days.  Thea Silversmith, RN, MSN, [place] Coordinator Cell: [phone]

## 2018-08-21 NOTE — Telephone Encounter (Signed)
OK to make these changes.

## 2018-08-21 NOTE — Patient Outreach (Signed)
[facility]) Care Management  08/21/2018  [place] 1946-06-17 [id number]   Social work referral received on 08/17/18 for transportation resources.  [name] discharged from inpatient on Saturday, 08/19/18.  BSW attempted to outreach patient today but there was no answer or option to leave voicemail.  BSW will attempt to reach her again within four business days. Unsuccessful outreach letter mailed.   Ronn Melena, BSW Social Worker [phone]

## 2018-08-22 ENCOUNTER — Encounter (HOSPITAL_COMMUNITY)
Admission: RE | Admit: 2018-08-22 | Discharge: 2018-08-22 | Disposition: A | Discharge status: Home / Self Care | Payer: Self-pay | Source: Ambulatory Visit | Attending: Emergency Medicine | Admitting: Emergency Medicine

## 2018-08-22 DIAGNOSIS — J432 Centrilobular emphysema: Secondary | ICD-10-CM | POA: Insufficient documentation

## 2018-08-22 NOTE — Progress Notes (Signed)
Kayla Mack has been in the hospital 08/15/18 through 08/19/18. I contacted her today and discussed her situation since being  dischared from the hospital. They ordered home health physical therapy to start at discharge. I am going to discharge her from the Pulmonary Maintenance program. [name] would like to return to the Pulmonary Undergraduate program once she has completed her home health physical therapy. We will proceed to ask for an order for that from Dr. Lamonte Sakai.

## 2018-08-23 ENCOUNTER — Other Ambulatory Visit: Payer: Self-pay

## 2018-08-23 ENCOUNTER — Telehealth: Payer: Self-pay | Admitting: Emergency Medicine

## 2018-08-23 DIAGNOSIS — I10 Essential (primary) hypertension: Secondary | ICD-10-CM | POA: Diagnosis not present

## 2018-08-23 DIAGNOSIS — J9611 Chronic respiratory failure with hypoxia: Secondary | ICD-10-CM | POA: Diagnosis not present

## 2018-08-23 DIAGNOSIS — J441 Chronic obstructive pulmonary disease with (acute) exacerbation: Secondary | ICD-10-CM | POA: Diagnosis not present

## 2018-08-23 DIAGNOSIS — I2510 Atherosclerotic heart disease of native coronary artery without angina pectoris: Secondary | ICD-10-CM | POA: Diagnosis not present

## 2018-08-23 DIAGNOSIS — F329 Major depressive disorder, single episode, unspecified: Secondary | ICD-10-CM | POA: Diagnosis not present

## 2018-08-23 DIAGNOSIS — I272 Pulmonary hypertension, unspecified: Secondary | ICD-10-CM | POA: Diagnosis not present

## 2018-08-23 NOTE — Telephone Encounter (Signed)
Called and spoke to [place] with [facility].  [place] states pt had a recent admission on 08/15/18. Prior to admission, pt was taking Trelegy daily.  Pt is no longer taking this medication. Per [place] it is not mentioned on discharge summary to d/c trelegy. [place] also stated that pt was taking duoneb TID prior to admission, but was instructed to only do duoneb q4h prn at discharge. Marye Round is wanting to know if pt should be takingTrelegy and should duoneb be PRN or daily. [place] is currently with pt and is requesting call back ASAP.   RB please advise.

## 2018-08-23 NOTE — Telephone Encounter (Signed)
Per RB verbally- resume Trelegy daily and duoneb q4h PRN.  [place] with [facility] is aware of recommendations and voiced her understanding.  Nothing further is needed.

## 2018-08-23 NOTE — Patient Outreach (Signed)
[facility] [facility]) Care Management  08/23/2018  Kayla Mack [birth year] [id number]   Second outreach regarding social work referral for transportation resources.  BSW was able to connect with patient today.  BSW explained social work services and reason for referral.  Patient reported that she currently still drives so she is not in need of transportation assistance.  Patient denied any other social work needs so BSW is closing case at this time.   Ronn Melena, BSW Social Worker [phone]

## 2018-08-24 ENCOUNTER — Encounter (HOSPITAL_COMMUNITY): Payer: Self-pay

## 2018-08-24 ENCOUNTER — Ambulatory Visit: Payer: Self-pay

## 2018-08-25 ENCOUNTER — Encounter: Payer: Self-pay | Admitting: *Deleted

## 2018-08-25 ENCOUNTER — Other Ambulatory Visit: Payer: Self-pay | Admitting: *Deleted

## 2018-08-25 ENCOUNTER — Other Ambulatory Visit: Payer: Self-pay

## 2018-08-25 DIAGNOSIS — I10 Essential (primary) hypertension: Secondary | ICD-10-CM | POA: Diagnosis not present

## 2018-08-25 DIAGNOSIS — J9611 Chronic respiratory failure with hypoxia: Secondary | ICD-10-CM | POA: Diagnosis not present

## 2018-08-25 DIAGNOSIS — F329 Major depressive disorder, single episode, unspecified: Secondary | ICD-10-CM | POA: Diagnosis not present

## 2018-08-25 DIAGNOSIS — J441 Chronic obstructive pulmonary disease with (acute) exacerbation: Secondary | ICD-10-CM | POA: Diagnosis not present

## 2018-08-25 DIAGNOSIS — I2510 Atherosclerotic heart disease of native coronary artery without angina pectoris: Secondary | ICD-10-CM | POA: Diagnosis not present

## 2018-08-25 DIAGNOSIS — I272 Pulmonary hypertension, unspecified: Secondary | ICD-10-CM | POA: Diagnosis not present

## 2018-08-25 NOTE — Patient Outreach (Addendum)
[facility]) Care Management [place]  EMMI discharge [place] notification 08/25/2018  CHANCEY CULLINANE September 16, 1946 [id number]  Successful outgoing telephone call x 2 to Marinell Blight, 72 y/o female referred to [facility] after recent hospitalization October 29- August 19, 2018 for COPD exacerbation.  HIPAA/ identity verified during both phone calls this afternoon.  Received notification of patient EMMI Red Flag regarding hospital discharge instructions and positive depression screen in staff coverage of patient's primary [facility] [place] RN CM.  2:30 pm:  Patient answered phone after several rings and sounded noticeably short of breath; reported that she had to "run inside" to answer phone, as she was "just leaving the house, and has car running."  I questioned if patient was able to drive, given her obvious shortness of breath, and she stated that she drives herself "everywhere," as she lives alone.  Patient stated that she "is fine" but is unable to talk, as she needs to leave her home; requested that I call back later today, and hung the phone up.  4:50 pm:  Placed second call to patient, as she requested; patient sounds much better with this call and she is not short of breath.  Patient stated that "is doing okay," and she denies needs/ concerns/ issues or problems this afternoon.  Explained to patient the purpose of my call this afternoon.  Patient stated that she "never knows how to answer those computer calls," and stated that the calls "irritate" her and she has a tendency to "become belligerent" with her responses to the calls.  I questioned if patient would like for me to request that the automated EMMI calls be stopped and she immediately responded, "yes- please."  Patient stated that she has had so many phone calls after being hospitalized that she "can't keep up with them."  I discussed with patient that she should not rush to answer calls (or etc) to the  point where it causes her to become short of breath, and she agreed with me, but stated it is "her normal nature" to try to take all phone calls, even if she can not talk.  I assured patient that [place] team would always leave her a voice message if she is unable to answer phone.   Patient further reports:  -- no specific questions around discharge instructions from recent hospital discharge; stated that she completed her prednisone taper, and denies questions/ concerns around her medications.  -- reports that she is "somewhat depressed" as her mother recently passed away and she has been handling the estate; reports that she has an Pharmacist, hospital that she has been "visiting regularly," and states that her counselor "is fabulous."  Denies hopelessness, suicide ideation- reports still attending church and denies loss of interest in regular activities  -- participating in home health services through [facility], as ordered post-hospital discharge; reports "visits going very well."    -- using home O2 at 5-6 L/min continuously via oxygenator; again confirms that her previously reported concerns around oxygenator "have now been fixed;" denies ongoing issues around home O2; states has been on O2 at home "for a couple of years."  Reports continuing to use rescue inhaler and nebulizer daily.  Has portable O2 tanks for when she leaves home.  Reports using CPAP at night.  Describes an incident this week where she woke up in the middle of the night feeling short of breath- states she used her nebulizer, took anti-anxiety medication, and read a book, and  this episode eventually resolved.  -- confirms that she is aware of scheduled pulmonology provider office visit next week on Wednesday 08/30/18; verbalizes plans to attend as scheduled, states "will drive self" to appointment.  Patient denies further issues, concerns, or problems today.  I explained that I would make her primary [facility] RN CCM aware of our  conversation today and confirmed that I would place request for automated EMMI calls to stop, as patient requested.  Plan:  Will place request with [facility] CMA for automated EMMI calls to be stopped, as patient requested  Will update patient's primary [facility] RN CCM of today's phone calls with patient  [facility] CM Care Plan Problem One     Most Recent Value  Care Plan Problem One  at risk for readmission  Role Documenting the Problem One  Care Management [place] for Problem One  Active  [facility] Long Term Goal   client will not be readmitted within the next 31 days.  [facility]N Long Term Goal Start Date  08/25/18  Interventions for Problem One Long Term Goal  EMMI red flag follow up phone call completed,  discussed with patient her current clnical condition,  placed request with [facility] CMA to end [place] calls per patient request,  confirmed that patient is aware of upcoming scheduled pulmonology appointment and plans to attend by driving herself to appointment  [facility] CM Short Term Goal #1   client will participate in home health services as scheduled within the next 30 days.  [facility] CM Short Term Goal #1 Start Date  08/25/18  Interventions for Short Term Goal #1  Confirmed with patient that she is actively participating in home health services as ordered post-hospital discharge,  discussed today's home health visit with patient     Oneta Rack, RN, BSN, [place] Coordinator [facility] Care Management  [phone]

## 2018-08-25 NOTE — Patient Outreach (Addendum)
[place] [facility]) Care Management  08/25/2018  Kayla Mack May 29, 1946 [id number]   RNCM called to follow up. Also noted EMMI red from yesterday call regarding: discharge papers and depression questions. RNCM spoke with client. Kayla Mack short of breath while talking states that she is with the physical therapist and states that she is taking a walking test with physical therapy. Client acknowledges that the problem with her oxygen compressor was fixed, but due to client is currently in a physical therapy session, RNCM will attempt to follow up with client at another time.    Thea Silversmith, RN, MSN, [place] Coordinator Cell: [phone]

## 2018-08-28 DIAGNOSIS — F329 Major depressive disorder, single episode, unspecified: Secondary | ICD-10-CM | POA: Diagnosis not present

## 2018-08-28 DIAGNOSIS — F4323 Adjustment disorder with mixed anxiety and depressed mood: Secondary | ICD-10-CM | POA: Diagnosis not present

## 2018-08-29 ENCOUNTER — Encounter (HOSPITAL_COMMUNITY): Payer: Self-pay

## 2018-08-30 ENCOUNTER — Ambulatory Visit (INDEPENDENT_AMBULATORY_CARE_PROVIDER_SITE_OTHER): Payer: Medicare Other | Admitting: Emergency Medicine

## 2018-08-30 ENCOUNTER — Encounter: Payer: Self-pay | Admitting: Emergency Medicine

## 2018-08-30 DIAGNOSIS — I272 Pulmonary hypertension, unspecified: Secondary | ICD-10-CM | POA: Diagnosis not present

## 2018-08-30 DIAGNOSIS — J441 Chronic obstructive pulmonary disease with (acute) exacerbation: Secondary | ICD-10-CM

## 2018-08-30 DIAGNOSIS — J9611 Chronic respiratory failure with hypoxia: Secondary | ICD-10-CM

## 2018-08-30 NOTE — Patient Instructions (Signed)
Stop Trelegy altogether for now Continue to use budesonide and Brovana nebulized twice a day. Start using DuoNeb nebulized 4 times a day on a schedule. Keep albuterol nebulized available to use if needed for shortness of breath, wheezing, chest tightness. Continue to use your trilogy ventilator at night while sleeping. Continue oxygen at 6 L/min at rest, increase to up to 10 L/min with heavier exertion. Follow with Dr Lamonte Sakai in 2 months or sooner if you have any problems.

## 2018-08-30 NOTE — Assessment & Plan Note (Signed)
Almost certainly secondary to profound COPD and chronic hypoxemic respiratory failure.  She is going to go for evaluation at [facility] to discuss [place].  I suspect that she is not a good candidate for medical therapy given the principal underlying cause of her PAH.

## 2018-08-30 NOTE — Progress Notes (Signed)
Subjective:    Patient ID: Kayla Mack, female    DOB: Aug 23, 1946, 72 y.o.   MRN: [id number]  COPD  Her past medical history is significant for COPD.   ROV 01/25/18 --patient follows up today for COPD and chronic hypoxic and hypercapnic respiratory failure.  She has severe obstruction by pulmonary function testing.  At her last visit we had planned to do a trial of Trelegy to see if she preferred - we didn't have any samples. Currently on Anoro. Uses albuterol very rarely. Denies any cough or wheeze. No flares since last time.   She has completed pulm rehab, noted that she needed 10L/min when she was working the hardest with them. She switched her o2 to [place], she now has a constant flow 15L/min regulator that she can use on one of her portable tanks to allow exercise on 10L/min. At other times she is on 4L/min.   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  She also has secondary PAH.  She was off her maintenance bronchodilators for a few months due to cost.  We tried to get these restarted in April.  She was unfortunately admitted to the hospital in early June with an acute exacerbation and combined respiratory failure.  She was finally able to get Trelegy with financial assistance program.  She is using it now.  She is on chronic oxygen 4 L/min at rest, 10 L/min with exercise.  She uses albuterol approximately. She is back at pulmonary rehab in the maintenance program. She believes that the trelegy is probably helping her, has allowed her to clear secretions more effectively. Probably better exercise tolerance. She is using albuterol nebs about every other day. She has some anxiety and depression.   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  She is currently managed on trelegy.   She is using albuterol very rarely.  She uses oxygen at 4 L/min at rest, 8 to 10 L/min when she is exercising.  She has been participating in the pulmonary rehab maintenance program. No flares since last time. No pred, abx.   Spirometry 06/06/17 > severe obstruction Pulmonary function testing performed today 10/4 were reviewed by me. These showSevere obstruction with an FEV1 of 0.92 L (48% predicted), no bronchodilator response, hyperinflated volumes, severely decreased diffusion capacity. R heart cath 05/09/17: PAP 57/22 (38), PAOP 76  ROV 72 year old woman with very severe COPD, combined hypoxia and hypercapnic respiratory failure.  FEV1 0.92 L for this 48% predicted).  She has secondary pulmonary hypertension principally due to her pulmonary disease.  She uses a trilogy mechanical ventilator nightly.  She was admitted on 08/15/2018 with an acute exacerbation.,  Possible superimposed pneumonia.  She was discharged on nebulized therapy, I asked [employer] to change her back to Trelegy  She is actually using brovana / budesonide bid, albuterol nebs prn.  Not clear to me that she is using duoneb. She has high FiO2 needs > 6L/min up to 10L/min with her pulm rehab and exertion. She is planning to go to [place] today to discuss her [place].    Review of Systems As above      Objective:   Physical Exam Vitals:   08/30/18 1517 08/30/18 1518  BP:  124/70  Pulse:  96  SpO2:  93%  Weight: 134 lb (60.8 kg)   Height:  _0  (1.575 m)    Gen: Pleasant, thin ill appearing, in no distress,  normal affect  ENT: No lesions,  mouth clear,  oropharynx clear, no postnasal drip  Neck: No JVD, no stridor  Lungs: No use of accessory muscles, very distant, no wheezes   Cardiovascular: RRR, heart sounds normal, no murmur or gallops, no peripheral edema  Musculoskeletal: No deformities, no cyanosis or clubbing  Neuro: alert, non focal  Skin: Warm, no lesions or rashes       Assessment & Plan:  Chronic  respiratory failure with hypoxia (HCC) Requiring high flow oxygen at baseline.  She uses a trilogy ventilator at night.  Continue same regimen  COPD (chronic obstructive pulmonary disease) (HCC) Confusion regarding her bronchodilators.  She may have benefited more from the nebulized therapy so I will stop the Trelegy altogether.  Continue budesonide/Brovana twice daily, and back DuoNeb 4 times a day on a schedule.  Continue albuterol nebs as needed.  Pulmonary hypertension ([place]) Almost certainly secondary to profound COPD and chronic hypoxemic respiratory failure.  She is going to go for evaluation at [facility] to discuss [place].  I suspect that she is not a good candidate for medical therapy given the principal underlying cause of her PAH.  Baltazar Apo, MD, PhD 08/30/2018, 3:46 PM [facility] [phone] or if no answer [phone]

## 2018-08-30 NOTE — Assessment & Plan Note (Signed)
Confusion regarding her bronchodilators.  She may have benefited more from the nebulized therapy so I will stop the Trelegy altogether.  Continue budesonide/Brovana twice daily, and back DuoNeb 4 times a day on a schedule.  Continue albuterol nebs as needed.

## 2018-08-30 NOTE — Assessment & Plan Note (Signed)
Requiring high flow oxygen at baseline.  She uses a trilogy ventilator at night.  Continue same regimen

## 2018-08-31 ENCOUNTER — Encounter (HOSPITAL_COMMUNITY): Payer: Self-pay

## 2018-08-31 ENCOUNTER — Other Ambulatory Visit: Payer: Self-pay

## 2018-08-31 DIAGNOSIS — J431 Panlobular emphysema: Secondary | ICD-10-CM | POA: Diagnosis not present

## 2018-08-31 DIAGNOSIS — Z79899 Other long term (current) drug therapy: Secondary | ICD-10-CM | POA: Diagnosis not present

## 2018-08-31 DIAGNOSIS — R0609 Other forms of dyspnea: Secondary | ICD-10-CM | POA: Diagnosis not present

## 2018-08-31 DIAGNOSIS — J9610 Chronic respiratory failure, unspecified whether with hypoxia or hypercapnia: Secondary | ICD-10-CM | POA: Diagnosis not present

## 2018-08-31 DIAGNOSIS — R918 Other nonspecific abnormal finding of lung field: Secondary | ICD-10-CM | POA: Diagnosis not present

## 2018-08-31 DIAGNOSIS — J439 Emphysema, unspecified: Secondary | ICD-10-CM | POA: Diagnosis not present

## 2018-08-31 DIAGNOSIS — I272 Pulmonary hypertension, unspecified: Secondary | ICD-10-CM | POA: Diagnosis not present

## 2018-08-31 DIAGNOSIS — R0602 Shortness of breath: Secondary | ICD-10-CM | POA: Diagnosis not present

## 2018-08-31 DIAGNOSIS — I2721 Secondary pulmonary arterial hypertension: Secondary | ICD-10-CM | POA: Diagnosis not present

## 2018-08-31 NOTE — Patient Outreach (Signed)
[facility]) Care Management  08/31/2018  Kayla Mack [birth year] [id number]  72 year old with history of COPD, pulmonary hypertension, chronic respiratory failure with hypoxia, depression, anxiety, hyperlipidemia, Gerd, Per chart client is on continuous O2 at 4L/[place] at rest, 10L/ with exercise. Per chart client was seen by pulmonologist on 08/30/18.  RNCM called to follow up. No answer. HIPPA compliant message left.  Plan: RNCM will await return call and outreach within the next 3-4 business days if no return call.  Thea Silversmith, RN, MSN, [place] Coordinator Cell: [phone]

## 2018-08-31 NOTE — Patient Outreach (Signed)
[facility]) Care Management  08/31/2018  Kayla Mack [birth year] [id number]   Multidisciplinary case discussion completed. RNCM will continue to outreach/follow.  Thea Silversmith, RN, MSN, [place] Coordinator Cell: [phone]

## 2018-09-02 DIAGNOSIS — J9611 Chronic respiratory failure with hypoxia: Secondary | ICD-10-CM | POA: Diagnosis not present

## 2018-09-02 DIAGNOSIS — J441 Chronic obstructive pulmonary disease with (acute) exacerbation: Secondary | ICD-10-CM | POA: Diagnosis not present

## 2018-09-02 DIAGNOSIS — I10 Essential (primary) hypertension: Secondary | ICD-10-CM | POA: Diagnosis not present

## 2018-09-02 DIAGNOSIS — I272 Pulmonary hypertension, unspecified: Secondary | ICD-10-CM | POA: Diagnosis not present

## 2018-09-02 DIAGNOSIS — I2510 Atherosclerotic heart disease of native coronary artery without angina pectoris: Secondary | ICD-10-CM | POA: Diagnosis not present

## 2018-09-02 DIAGNOSIS — F329 Major depressive disorder, single episode, unspecified: Secondary | ICD-10-CM | POA: Diagnosis not present

## 2018-09-04 DIAGNOSIS — F329 Major depressive disorder, single episode, unspecified: Secondary | ICD-10-CM | POA: Diagnosis not present

## 2018-09-04 DIAGNOSIS — F4323 Adjustment disorder with mixed anxiety and depressed mood: Secondary | ICD-10-CM | POA: Diagnosis not present

## 2018-09-05 ENCOUNTER — Encounter (HOSPITAL_COMMUNITY): Payer: Self-pay

## 2018-09-06 ENCOUNTER — Other Ambulatory Visit: Payer: Self-pay

## 2018-09-06 NOTE — Patient Outreach (Signed)
[facility]) Care Management  09/06/2018  Kayla Mack 07-17-1946 [id number]   72 year old with history of COPD, pulmonary hypertension, chronic respiratory failure with hypoxia, depression, anxiety, hyperlipidemia, Gerd, Per chart client is on continuous O2 at 4L/[place] at rest, 10L/[place] with exercise. Per chart client was seen by pulmonologist on 08/30/18.  RNCM called to follow up. No answer. HIPPA compliant message left. Second attempt.  Plan: send outreach letter and reach out within 3-4 business days.  Thea Silversmith, RN, MSN, [place] Coordinator Cell: [phone]

## 2018-09-07 ENCOUNTER — Encounter (HOSPITAL_COMMUNITY): Payer: Self-pay

## 2018-09-07 DIAGNOSIS — F329 Major depressive disorder, single episode, unspecified: Secondary | ICD-10-CM | POA: Diagnosis not present

## 2018-09-07 DIAGNOSIS — J441 Chronic obstructive pulmonary disease with (acute) exacerbation: Secondary | ICD-10-CM | POA: Diagnosis not present

## 2018-09-07 DIAGNOSIS — I2510 Atherosclerotic heart disease of native coronary artery without angina pectoris: Secondary | ICD-10-CM | POA: Diagnosis not present

## 2018-09-07 DIAGNOSIS — I10 Essential (primary) hypertension: Secondary | ICD-10-CM | POA: Diagnosis not present

## 2018-09-07 DIAGNOSIS — I272 Pulmonary hypertension, unspecified: Secondary | ICD-10-CM | POA: Diagnosis not present

## 2018-09-07 DIAGNOSIS — J9611 Chronic respiratory failure with hypoxia: Secondary | ICD-10-CM | POA: Diagnosis not present

## 2018-09-08 ENCOUNTER — Telehealth: Payer: Self-pay | Admitting: Emergency Medicine

## 2018-09-08 NOTE — Telephone Encounter (Signed)
lmtcb for Almyra Free at [employer].

## 2018-09-08 NOTE — Telephone Encounter (Signed)
Instructions from pt's last OV with RB 08/30/18  Stop Trelegy altogether for now due to confusion with bronchodilator inhalers Continue to use budesonide and Brovana nebulized twice a day. Start using DuoNeb nebulized 4 times a day on a schedule. Keep albuterol nebulized available to use if needed for shortness of breath, wheezing, chest tightness. Continue to use your trilogy ventilator at night while sleeping. Continue oxygen at 6 L/min at rest, increase to up to 10 L/min with heavier exertion. Follow with Dr Lamonte Sakai in 2 months or sooner if you have any problems.     Mcarthur Rossetti with [facility] but was unable to speak to her. Left message for Almyra Free to return call.

## 2018-09-08 NOTE — Telephone Encounter (Signed)
Almyra Free returning call on this pt and can be reached @ [phone].[place]

## 2018-09-11 ENCOUNTER — Other Ambulatory Visit: Payer: Self-pay

## 2018-09-11 DIAGNOSIS — F4323 Adjustment disorder with mixed anxiety and depressed mood: Secondary | ICD-10-CM | POA: Diagnosis not present

## 2018-09-11 DIAGNOSIS — F329 Major depressive disorder, single episode, unspecified: Secondary | ICD-10-CM | POA: Diagnosis not present

## 2018-09-11 NOTE — Patient Outreach (Signed)
[facility]) Care Management  09/11/2018  Kayla Mack 11/11/1945 [id number]   72 year old with history of COPD, pulmonary hypertension, chronic respiratory failure with hypoxia, depression, anxiety, hyperlipidemia, Gerd, Per chart client is on continuous O2 at 4L/[place] at rest, 10L/[place] with exercise. Per chart client was seen by pulmonologist on 08/30/18.  RNCM called to follow up. Two patient identifiers confirmed. Client does not seem SOB as when RNCM last spoke with client telephonically. She reports being in contact with her providers.  Kayla Mack reports feeling overwhelmed and states, "I have just been bombarded with nurses". She reports home health nurse, Sport and exercise psychologist, [place] care as wall as Palliative care nurse calls. She adds that she is overwhelmed with trying to care for herself and taking care of her mother and father's estate. She reports a counselor is coming over this afternoon to talk with her. She also reports a Palliative care nurse is coming to see her tomorrow and they are going to go over her medications.  RNCM encouraged client to express her feelings and assured client that it was not the intent to add to her sense of feeling overwhelmed, only to make sure she has the resources that she needs. Kayla Mack verbalizes understanding. Client declines scheduling a home visit at this time, but agrees to completing assessment telephonically tomorrow. She is agreeable to [facility] calling after her visit with the Palliative care nurse tomorrow.   Plan: follow up with client tomorrow.  Thea Silversmith, RN, MSN, [place] Coordinator Cell: [phone]

## 2018-09-11 NOTE — Telephone Encounter (Signed)
Called and spoke with Kayla Mack she stated that she needed to verify her nebulizer medications.   Confusion regarding her bronchodilators.  She may have benefited more from the nebulized therapy so I will stop the Trelegy altogether.  Continue budesonide/Brovana twice daily, and back DuoNeb 4 times a day on a schedule.  Continue albuterol nebs as needed.  Per RB last AVS above I advised Kayla Mack that these were the medications that the patient has been prescribed. Kayla Mack stated that per [employer] the patient cannot be covered for the Duoneb four times a day since she is also taking the Budesonide/Brovana. She can only be given the Duoneb once a day for it to be covered. Kayla Mack also stated that the patient is having increased anxiety/panic attacks and they are wanting to see if we can increase her clonazepam to more than once a day.   RB please advise, thank you.

## 2018-09-11 NOTE — Telephone Encounter (Signed)
Attempted to contact Kayla Mack with [facility]. I was advised that she was in the office yet. I have left a message for her to return our call.

## 2018-09-11 NOTE — Telephone Encounter (Signed)
Kayla Mack with [facility] returned call, CB is cell [phone].

## 2018-09-12 ENCOUNTER — Other Ambulatory Visit: Payer: Self-pay

## 2018-09-12 ENCOUNTER — Encounter (HOSPITAL_COMMUNITY): Payer: Self-pay

## 2018-09-12 NOTE — Patient Outreach (Signed)
[facility]) Care Management  09/12/2018  Araeya Lamb [place] April 14, 1946 [id number]   72 year old with history of COPD, pulmonary hypertension, chronic respiratory failure with hypoxia, depression, anxiety, hyperlipidemia, GERD, Per chart client is on continuous O2 at 4L/[place] at rest, 10L/[place] with exercise. Hospitalized 10/29-11/3 with COPD exacerbation. Per chart client was seen by pulmonologist on 08/30/18.  RNCM called [facility] program in response to client's statement that palliative care is involved. RNCM spoke with Marjie who confirmed that client has been active with them for about two weeks.   RNCM called and spoke with client. Client confirmed that she is involved with [facility]. RNCM provided positive feedback with client's involvement with [facility] program. RNCM discussed case closure. Client is agreeable.  Plan: close case.  Thea Silversmith, RN, MSN, [place] Coordinator Cell: [phone]

## 2018-09-13 DIAGNOSIS — J449 Chronic obstructive pulmonary disease, unspecified: Secondary | ICD-10-CM | POA: Diagnosis not present

## 2018-09-13 DIAGNOSIS — D179 Benign lipomatous neoplasm, unspecified: Secondary | ICD-10-CM | POA: Diagnosis not present

## 2018-09-13 DIAGNOSIS — F419 Anxiety disorder, unspecified: Secondary | ICD-10-CM | POA: Diagnosis not present

## 2018-09-13 DIAGNOSIS — I10 Essential (primary) hypertension: Secondary | ICD-10-CM | POA: Diagnosis not present

## 2018-09-13 NOTE — Telephone Encounter (Signed)
Called Almyra Free, unable to reach left message to give [place] a call back.

## 2018-09-13 NOTE — Telephone Encounter (Signed)
Please clarify the nebulizers as follows:  - stop Brovana altogether.  - start DuoNeb 4x a day on a schedule - continue pulmicort (budesonide) 2x a day on a schedule.   I don't feel comfortable changing the clonazepam right now. I'd like to see how the nebulizer changes affect her breathing and her anxiety. Would want to see her to discuss further before recommending a change in her anxiety meds.

## 2018-09-15 NOTE — Telephone Encounter (Signed)
Called [facility] and spoke with Tammy to get pt's medications taken care of. Tammy expressed understanding and stated she would let Almyra Free know the information. Nothing further needed.

## 2018-09-19 DIAGNOSIS — F329 Major depressive disorder, single episode, unspecified: Secondary | ICD-10-CM | POA: Diagnosis not present

## 2018-09-19 DIAGNOSIS — F4323 Adjustment disorder with mixed anxiety and depressed mood: Secondary | ICD-10-CM | POA: Diagnosis not present

## 2018-09-20 DIAGNOSIS — I10 Essential (primary) hypertension: Secondary | ICD-10-CM | POA: Diagnosis not present

## 2018-09-20 DIAGNOSIS — I2510 Atherosclerotic heart disease of native coronary artery without angina pectoris: Secondary | ICD-10-CM | POA: Diagnosis not present

## 2018-09-20 DIAGNOSIS — F329 Major depressive disorder, single episode, unspecified: Secondary | ICD-10-CM | POA: Diagnosis not present

## 2018-09-20 DIAGNOSIS — I272 Pulmonary hypertension, unspecified: Secondary | ICD-10-CM | POA: Diagnosis not present

## 2018-09-20 DIAGNOSIS — J441 Chronic obstructive pulmonary disease with (acute) exacerbation: Secondary | ICD-10-CM | POA: Diagnosis not present

## 2018-09-20 DIAGNOSIS — J9611 Chronic respiratory failure with hypoxia: Secondary | ICD-10-CM | POA: Diagnosis not present

## 2018-09-25 ENCOUNTER — Encounter: Payer: Self-pay | Admitting: Primary Care

## 2018-09-25 ENCOUNTER — Ambulatory Visit (INDEPENDENT_AMBULATORY_CARE_PROVIDER_SITE_OTHER): Payer: Medicare Other | Admitting: Primary Care

## 2018-09-25 ENCOUNTER — Ambulatory Visit (INDEPENDENT_AMBULATORY_CARE_PROVIDER_SITE_OTHER)
Admission: RE | Admit: 2018-09-25 | Discharge: 2018-09-25 | Disposition: A | Discharge status: Home / Self Care | Payer: Medicare Other | Source: Ambulatory Visit | Attending: Primary Care | Admitting: Primary Care

## 2018-09-25 ENCOUNTER — Telehealth: Payer: Self-pay | Admitting: Primary Care

## 2018-09-25 VITALS — BP 148/80 | HR 87 | Temp 98.1°F | Ht 62.0 in | Wt 135.2 lb

## 2018-09-25 DIAGNOSIS — J9611 Chronic respiratory failure with hypoxia: Secondary | ICD-10-CM | POA: Diagnosis not present

## 2018-09-25 DIAGNOSIS — J441 Chronic obstructive pulmonary disease with (acute) exacerbation: Secondary | ICD-10-CM

## 2018-09-25 DIAGNOSIS — R0781 Pleurodynia: Secondary | ICD-10-CM

## 2018-09-25 DIAGNOSIS — R0600 Dyspnea, unspecified: Secondary | ICD-10-CM | POA: Diagnosis not present

## 2018-09-25 DIAGNOSIS — R079 Chest pain, unspecified: Secondary | ICD-10-CM | POA: Diagnosis not present

## 2018-09-25 DIAGNOSIS — I272 Pulmonary hypertension, unspecified: Secondary | ICD-10-CM

## 2018-09-25 LAB — BASIC METABOLIC PANEL
BUN: 12 mg/dL (ref 6–23)
CO2: 30 mEq/L (ref 19–32)
Calcium: 9.1 mg/dL (ref 8.4–10.5)
Chloride: 104 mEq/L (ref 96–112)
Creatinine, Ser: 0.71 mg/dL (ref 0.40–1.20)
GFR: 85.54 mL/min (ref >60.00)
Glucose, Bld: 100 mg/dL — ABNORMAL HIGH (ref 70–99)
Potassium: 4.2 mEq/L (ref 3.5–5.1)
Sodium: 141 mEq/L (ref 135–145)

## 2018-09-25 LAB — CBC
HCT: 33.8 % — ABNORMAL LOW (ref 36.0–46.0)
Hemoglobin: 11.1 g/dL — ABNORMAL LOW (ref 12.0–15.0)
MCHC: 32.8 g/dL (ref 30.0–36.0)
MCV: 91.9 fl (ref 78.0–100.0)
Platelets: 310 10*3/uL (ref 150.0–400.0)
RBC: 3.68 Mil/uL — ABNORMAL LOW (ref 3.87–5.11)
RDW: 13.8 % (ref 11.5–15.5)
WBC: 8.6 10*3/uL (ref 4.0–10.5)

## 2018-09-25 LAB — D-DIMER, QUANTITATIVE (NOT AT ARMC): D-Dimer, Quant: 0.8 mcg/mL FEU — ABNORMAL HIGH (ref <0.50)

## 2018-09-25 MED ORDER — BISOPROLOL FUMARATE 10 MG PO TABS: 10.0000 mg | ORAL_TABLET | Freq: Every day | ORAL | 1 refills | Status: DC

## 2018-09-25 MED ORDER — TRAMADOL HCL 50 MG PO TABS: ORAL_TABLET | ORAL | 0 refills | Status: DC

## 2018-09-25 MED ORDER — BISOPROLOL FUMARATE 10 MG PO TABS: 10.0000 mg | ORAL_TABLET | Freq: Every day | ORAL | 6 refills | Status: DC

## 2018-09-25 NOTE — Patient Instructions (Addendum)
CXR today please  Tramadol 1/2 - 1 tab every 8 hours for back pain  Increase bisoprolol to 40m daily for high blood pressure (will send in new RX) Please monitor BP and let uKoreaknow if >140/90 or <100/60   I will discuss with Dr. BLamonte Sakairegarding [facility] recommendations. Please call if you have not heard from me by Friday morning

## 2018-09-25 NOTE — Assessment & Plan Note (Signed)
Stable; continue same regimen - O2 sat 97%  - Trilogy ventilator at night

## 2018-09-25 NOTE — Progress Notes (Signed)
_0  ID: Kayla Mack, female    DOB: May 20, 1946, 72 y.o.   MRN: [id number]  Chief Complaint  Patient presents with  . Acute Visit    high BP, pain in right back    Referring provider: Jani Gravel, MD  HPI: 71 year old female, former smoker. PMH severe COPD, chronic respiratory failure with hypoxia, pulmonary hypertension, anxiety, tachycardia, osteopenia, GERD. Patient of Dr. Lamonte Sakai, last seen 08/30/18.   09/25/2018 Patient presents today with acute complaints of right posterior back pain x 4 days. Accompanied by family friend. States that it hurts to take a deep breath and lie down flat on her back at night. Some mild cough but this is not abnormal for her. Cough is productive with no color. Breathing is the same. No additional oxygen requirement. Denies trauma or fall.   Additional reports of elevated blood pressure readings. States that she checked with automatic cuff. She reports that her blood pressure was 183/95 today before am medication. SBP has been running between 170-180's at home. Thinks a lot of it is anxiety and panic attack. Takes Bisoprolol 55m daily and clonazepam 0.588mBID as needed (usually once a day). Feels her blood pressure was better controlled on Atenolol in the past, states that it was changed during a hospital admission over a year ago. She does not have a PCP, states that her primary care doctor is out on medical leave.    [facility] are wanting her to have additional studies done including heart cath, CT and MRI next week. She is unsure if she needs them, would like to know if Dr. ByLamonte Sakaihinks it would be worth it. States that if it's not going to prolong or add quality to her life then she does not want to do it. Per Dr. ByLamonte Sakaihe is not a good candidate for medical therapy given underlying cause of PAH. She is fearful of living alone, has questions if she should sell her house and move to Assisted living.      Testing: Spirometry 06/06/17 > severe obstruction Pulmonary function testing performed today 10/4 were reviewed by me. These showSevere obstruction with an FEV1 of 0.92 L (48% predicted), no bronchodilator response, hyperinflated volumes, severely decreased diffusion capacity. R heart cath 05/09/17: PAP 57/22 (38), PAOP 12   Allergies  Allergen Reactions  . Aleve [Naproxen Sodium] Hives  . Aspirin Swelling    Facial swelling  . Penicillins Hives and Swelling    Has patient had a PCN reaction causing immediate rash, facial/tongue/throat swelling, SOB or lightheadedness with hypotension: Yes Has patient had a PCN reaction causing severe rash involving mucus membranes or skin necrosis: Yes Has patient had a PCN reaction that required hospitalization: Unk Has patient had a PCN reaction occurring within the last 10 years: No If all of the above answers are "NO", then may proceed with Cephalosporin use.     Immunization History  Administered Date(s) Administered  . Influenza, High Dose Seasonal PF 07/18/2016, 07/13/2017, 07/17/2018  . Influenza-Unspecified 07/18/2013  . Pneumococcal Conjugate-13 07/13/2017  . Pneumococcal Polysaccharide-23 07/18/2016    Past Medical History:  Diagnosis Date  . Anxiety   . COPD (chronic obstructive pulmonary disease) (HCNikols[place]  . Depression   . Hyperlipidemia   . Hypertension     Tobacco History: Social History   Tobacco Use  Smoking Status Former Smoker  . Packs/day: 1.50  . Years: 40.00  . Pack years: 60.00  . Types:  Cigarettes  . Last attempt to quit: 03/18/2017  . Years since quitting: 1.5  Smokeless Tobacco Never Used   Counseling given: Not Answered   Outpatient Medications Prior to Visit  Medication Sig Dispense Refill  . acetaminophen (TYLENOL) 500 MG tablet Take 1,000 mg by mouth 2 (two) times daily as needed for pain.     [name] albuterol (PROVENTIL) (2.5 MG/3ML) 0.083% nebulizer solution Take 3 mLs (2.5 mg total) by nebulization  every 4 (four) hours as needed for wheezing or shortness of breath. 75 mL 0  . budesonide (PULMICORT) 0.5 MG/2ML nebulizer solution Take 2 mLs (0.5 mg total) by nebulization 2 (two) times daily. Dx: J44.9 120 mL 5  . calcium-vitamin D (OSCAL WITH D) 500-200 MG-UNIT per tablet Take 1 tablet by mouth 2 (two) times daily. (Patient taking differently: Take 1 tablet by mouth daily. )    . clonazePAM (KLONOPIN) 0.5 MG tablet Take 1 tablet (0.5 mg total) by mouth 2 (two) times daily as needed for anxiety. (Patient taking differently: Take 0.5 mg by mouth 3 times/day as needed-between meals & bedtime for anxiety (sleep). ) 10 tablet 0  . clopidogrel (PLAVIX) 75 MG tablet Take 1 tablet (75 mg total) by mouth daily. 30 tablet 0  . Diphenhyd-Hydrocort-Nystatin (FIRST-DUKES MOUTHWASH) SUSP Gargle and swallow 1 tsp four times daily 240 mL 0  . diphenhydrAMINE (BENADRYL) 25 mg capsule Take 25 mg by mouth every 6 (six) hours as needed for allergies.     . fluticasone (FLONASE) 50 MCG/ACT nasal spray Place 2 sprays into both nostrils daily. 16 g 0  . guaiFENesin (MUCINEX) 600 MG 12 hr tablet Take 1 tablet (600 mg total) by mouth 2 (two) times daily. 30 tablet 0  . ipratropium-albuterol (DUONEB) 0.5-2.5 (3) MG/3ML SOLN Take 3 mLs by nebulization daily. Dx: J44.9 90 mL 5  . loratadine (CLARITIN) 10 MG tablet Take 1 tablet (10 mg total) by mouth daily. (Patient taking differently: Take 10 mg by mouth daily as needed for allergies. ) 30 tablet 0  . lovastatin (MEVACOR) 40 MG tablet Take 40 mg by mouth every morning.    [name] PARoxetine (PAXIL) 20 MG tablet Take 20 mg by mouth daily.    . sodium chloride (OCEAN) 0.65 % SOLN nasal spray Place 1 spray into both nostrils as needed for congestion.    . bisoprolol (ZEBETA) 5 MG tablet Take 1 tablet (5 mg total) by mouth daily. 30 tablet 0   No facility-administered medications prior to visit.     Review of Systems  Review of Systems  Constitutional: Negative.   HENT:  Negative.   Respiratory: Positive for cough. Negative for apnea, choking, chest tightness, shortness of breath, wheezing and stridor.   Cardiovascular: Negative.   Musculoskeletal: Positive for back pain.    Physical Exam  BP (!) 148/80   Pulse 87   Temp 98.1 F (36.7 C)   Ht _0  (1.575 m)   Wt 135 lb 3.2 oz (61.3 kg)   SpO2 97%   BMI 24.73 kg/m  Physical Exam  Constitutional: She is oriented to person, place, and time. She appears well-developed and well-nourished.  HENT:  Head: Normocephalic and atraumatic.  Eyes: Pupils are equal, round, and reactive to light. EOM are normal.  Neck: Normal range of motion. Neck supple.  Cardiovascular: Normal rate and regular rhythm.  Pulmonary/Chest: Effort normal. No stridor. No respiratory distress. She has no wheezes. She has no rales. She exhibits no tenderness.  CTA, diminished  O2  sat 97% on oxygen  Musculoskeletal: Normal range of motion.  Right posterior back pain with twisting Non-tender to palpation, no deformity   Neurological: She is alert and oriented to person, place, and time.  Skin: Skin is warm and dry.  Psychiatric: She has a normal mood and affect. Her behavior is normal. Judgment and thought content normal.     Lab Results:  CBC    Component Value Date/Time   WBC 9.2 08/15/2018 1812   RBC 3.65 (L) 08/15/2018 1812   HGB 10.7 (L) 08/15/2018 1812   HCT 35.1 (L) 08/15/2018 1812   PLT 297 08/15/2018 1812   MCV 96.2 08/15/2018 1812   MCH 29.3 08/15/2018 1812   MCHC 30.5 08/15/2018 1812   RDW 13.8 08/15/2018 1812   LYMPHSABS 1.2 08/15/2018 1812   MONOABS 0.4 08/15/2018 1812   EOSABS 0.4 08/15/2018 1812   BASOSABS 0.1 08/15/2018 1812    BMET    Component Value Date/Time   NA 138 08/15/2018 1812   K 4.5 08/15/2018 1812   CL 103 08/15/2018 1812   CO2 26 08/15/2018 1812   GLUCOSE 108 (H) 08/15/2018 1812   BUN 21 08/15/2018 1812   CREATININE 0.79 08/15/2018 1812   CALCIUM 9.2 08/15/2018 1812   GFRNONAA  >60 08/15/2018 1812   GFRAA >60 08/15/2018 1812    BNP    Component Value Date/Time   BNP 338.0 (H) 07/26/2018 1233    ProBNP No results found for: PROBNP  Imaging: Dg Chest 2 View  Result Date: 09/25/2018 CLINICAL DATA:  Right-sided chest pain. COPD with acute exacerbation. EXAM: CHEST - 2 VIEW COMPARISON:  Chest x-rays dated 08/15/2018 and 05/04/2017 FINDINGS: Heart size and pulmonary vascularity are normal. Aortic atherosclerosis. Chronic severe interstitial and obstructive lung disease. No acute infiltrates or effusions. Chronic hyperinflation of the lungs. Mild compression fractures in the midthoracic spine which appear to be new since the prior x-ray of 05/04/2017. IMPRESSION: Severe chronic interstitial and obstructive lung disease. Interval mild compression fractures in the midthoracic spine. Aortic Atherosclerosis (ICD10-I70.0) and Emphysema (ICD10-J43.9). Electronically Signed   By: Lorriane Shire M.D.   On: 09/25/2018 11:22     Assessment & Plan:   Pleuritic pain - Posterior back pain x 4 days with deep breath, lying flat or twisting motion  - CXR showed now evidence of pneumonia; severe chronic interstitial and obstructive lung disease. Interval mild compression fractures in the midthoracic spine. - Awaiting D-dimer, if elevated consider CTA r/o PE  - Tramadol 25-47m every 8 hours as needed for pain, heat compress, gentle stretching - Return if no improvement   Hypertension - SBP elevated 170-180s at home before am medication - BP 148/80 today by provider recheck - Increase bisoprolol to 10 mg daily - Notify office if blood pressure greater than 140/90 or less than 100/60 - Follow up with PCP, possibly needs to establish with new provider   Chronic respiratory failure with hypoxia ([place] Stable; continue same regimen - O2 sat 97%  - Trilogy ventilator at night   Pulmonary hypertension ([place] - Likely secondary to severe COPD and chronic hypoxemic resp failure -  Following with [facility], additional testing ordered for next wee (heart cath, CT and MRI?) - Per Dr. BLamonte Sakaishe is not a good candidate for medical therapy given underlying cause of [place] NP 09/25/2018

## 2018-09-25 NOTE — Assessment & Plan Note (Signed)
-  Likely secondary to severe COPD and chronic hypoxemic resp failure - Following with [facility], additional testing ordered for next wee (heart cath, CT and MRI?) - Per Dr. Lamonte Sakai she is not a good candidate for medical therapy given underlying cause of [place]

## 2018-09-25 NOTE — Assessment & Plan Note (Addendum)
-  Posterior back pain x 4 days with deep breath, lying flat or twisting motion  - CXR showed now evidence of pneumonia; severe chronic interstitial and obstructive lung disease. Interval mild compression fractures in the midthoracic spine. - Awaiting D-dimer, if elevated consider CTA r/o PE  - Tramadol 25-48m every 8 hours as needed for pain, heat compress, gentle stretching - Return if no improvement

## 2018-09-25 NOTE — Assessment & Plan Note (Signed)
-  SBP elevated 170-180s at home before am medication - BP 148/80 today by provider recheck - Increase bisoprolol to 10 mg daily - Notify office if blood pressure greater than 140/90 or less than 100/60 - Follow up with PCP, possibly needs to establish with new provider

## 2018-09-25 NOTE — Telephone Encounter (Signed)
Call made to patient, made aware CXR results were not yet available, voiced understanding.   EW please advise of CXR results.

## 2018-09-26 ENCOUNTER — Other Ambulatory Visit: Payer: Self-pay

## 2018-09-26 ENCOUNTER — Ambulatory Visit: Payer: Self-pay

## 2018-09-26 DIAGNOSIS — R7989 Other specified abnormal findings of blood chemistry: Secondary | ICD-10-CM

## 2018-09-26 NOTE — Telephone Encounter (Signed)
Lattie Haw can you call, I sent test results to you

## 2018-09-26 NOTE — Telephone Encounter (Signed)
Please advise once available, thank you.

## 2018-09-26 NOTE — Telephone Encounter (Signed)
Spoke to pt and relayed results.  Ordered CTA and she is aware.  Will call if she needs anything.

## 2018-09-27 ENCOUNTER — Ambulatory Visit (INDEPENDENT_AMBULATORY_CARE_PROVIDER_SITE_OTHER)
Admission: RE | Admit: 2018-09-27 | Discharge: 2018-09-27 | Disposition: A | Discharge status: Home / Self Care | Payer: Medicare Other | Source: Ambulatory Visit | Attending: Primary Care | Admitting: Primary Care

## 2018-09-27 DIAGNOSIS — R7989 Other specified abnormal findings of blood chemistry: Secondary | ICD-10-CM | POA: Diagnosis not present

## 2018-09-27 DIAGNOSIS — R0602 Shortness of breath: Secondary | ICD-10-CM | POA: Diagnosis not present

## 2018-09-27 MED ORDER — IOPAMIDOL (ISOVUE-370) INJECTION 76%
80.0000 mL | Freq: Once | INTRAVENOUS | Status: AC | PRN
  Administered 2018-09-27: 80 mL via INTRAVENOUS

## 2018-09-27 NOTE — Telephone Encounter (Signed)
Going to close this message as it seems patient has results

## 2018-09-28 ENCOUNTER — Telehealth: Payer: Self-pay | Admitting: Primary Care

## 2018-09-28 NOTE — Telephone Encounter (Signed)
Called patient myself to review CTA results. No evidence of PE, severe emphysema. Atelectasis left base, enc deep breathing and IS. Spoke with her nurse as well to discuss findings. Advised she follow up with PCP regarding compression fractures of the spine for management of pain and possible referral to sports medicine or nero/surg. Due to back pain she may reschedule testing that is scheduled for next week with [facility].

## 2018-10-03 DIAGNOSIS — F329 Major depressive disorder, single episode, unspecified: Secondary | ICD-10-CM | POA: Diagnosis not present

## 2018-10-03 DIAGNOSIS — F4323 Adjustment disorder with mixed anxiety and depressed mood: Secondary | ICD-10-CM | POA: Diagnosis not present

## 2018-10-04 DIAGNOSIS — F329 Major depressive disorder, single episode, unspecified: Secondary | ICD-10-CM | POA: Diagnosis not present

## 2018-10-04 DIAGNOSIS — J441 Chronic obstructive pulmonary disease with (acute) exacerbation: Secondary | ICD-10-CM | POA: Diagnosis not present

## 2018-10-04 DIAGNOSIS — I10 Essential (primary) hypertension: Secondary | ICD-10-CM | POA: Diagnosis not present

## 2018-10-04 DIAGNOSIS — I272 Pulmonary hypertension, unspecified: Secondary | ICD-10-CM | POA: Diagnosis not present

## 2018-10-04 DIAGNOSIS — J9611 Chronic respiratory failure with hypoxia: Secondary | ICD-10-CM | POA: Diagnosis not present

## 2018-10-04 DIAGNOSIS — I2510 Atherosclerotic heart disease of native coronary artery without angina pectoris: Secondary | ICD-10-CM | POA: Diagnosis not present

## 2018-10-13 DIAGNOSIS — I272 Pulmonary hypertension, unspecified: Secondary | ICD-10-CM | POA: Diagnosis not present

## 2018-10-13 DIAGNOSIS — F329 Major depressive disorder, single episode, unspecified: Secondary | ICD-10-CM | POA: Diagnosis not present

## 2018-10-13 DIAGNOSIS — I10 Essential (primary) hypertension: Secondary | ICD-10-CM | POA: Diagnosis not present

## 2018-10-13 DIAGNOSIS — J441 Chronic obstructive pulmonary disease with (acute) exacerbation: Secondary | ICD-10-CM | POA: Diagnosis not present

## 2018-10-13 DIAGNOSIS — I2510 Atherosclerotic heart disease of native coronary artery without angina pectoris: Secondary | ICD-10-CM | POA: Diagnosis not present

## 2018-10-13 DIAGNOSIS — J9611 Chronic respiratory failure with hypoxia: Secondary | ICD-10-CM | POA: Diagnosis not present

## 2018-10-19 DIAGNOSIS — I10 Essential (primary) hypertension: Secondary | ICD-10-CM | POA: Diagnosis not present

## 2018-10-19 DIAGNOSIS — M810 Age-related osteoporosis without current pathological fracture: Secondary | ICD-10-CM | POA: Diagnosis not present

## 2018-10-25 DIAGNOSIS — F419 Anxiety disorder, unspecified: Secondary | ICD-10-CM | POA: Diagnosis not present

## 2018-10-25 DIAGNOSIS — Z79899 Other long term (current) drug therapy: Secondary | ICD-10-CM | POA: Diagnosis not present

## 2018-10-25 DIAGNOSIS — F329 Major depressive disorder, single episode, unspecified: Secondary | ICD-10-CM | POA: Diagnosis not present

## 2018-10-25 DIAGNOSIS — Z9981 Dependence on supplemental oxygen: Secondary | ICD-10-CM | POA: Diagnosis not present

## 2018-10-25 DIAGNOSIS — M810 Age-related osteoporosis without current pathological fracture: Secondary | ICD-10-CM | POA: Diagnosis not present

## 2018-10-25 DIAGNOSIS — J449 Chronic obstructive pulmonary disease, unspecified: Secondary | ICD-10-CM | POA: Diagnosis not present

## 2018-10-26 DIAGNOSIS — F4323 Adjustment disorder with mixed anxiety and depressed mood: Secondary | ICD-10-CM | POA: Diagnosis not present

## 2018-10-26 DIAGNOSIS — F329 Major depressive disorder, single episode, unspecified: Secondary | ICD-10-CM | POA: Diagnosis not present

## 2018-10-27 ENCOUNTER — Telehealth: Payer: Self-pay | Admitting: Emergency Medicine

## 2018-10-27 DIAGNOSIS — J441 Chronic obstructive pulmonary disease with (acute) exacerbation: Secondary | ICD-10-CM

## 2018-10-27 MED ORDER — IPRATROPIUM-ALBUTEROL 0.5-2.5 (3) MG/3ML IN SOLN: 3.0000 mL | Freq: Every day | RESPIRATORY_TRACT | 5 refills | Status: DC

## 2018-10-27 NOTE — Telephone Encounter (Signed)
Spoke with pt, advised her that I sent the duoneb to [facility] pharmacy. Pt understood and nothing further is needed.

## 2018-10-30 ENCOUNTER — Encounter: Payer: Self-pay | Admitting: Emergency Medicine

## 2018-10-30 ENCOUNTER — Ambulatory Visit (INDEPENDENT_AMBULATORY_CARE_PROVIDER_SITE_OTHER): Payer: Medicare Other | Admitting: Emergency Medicine

## 2018-10-30 VITALS — BP 110/70 | HR 80 | Wt 126.0 lb

## 2018-10-30 DIAGNOSIS — J438 Other emphysema: Secondary | ICD-10-CM | POA: Diagnosis not present

## 2018-10-30 DIAGNOSIS — J9611 Chronic respiratory failure with hypoxia: Secondary | ICD-10-CM

## 2018-10-30 DIAGNOSIS — J441 Chronic obstructive pulmonary disease with (acute) exacerbation: Secondary | ICD-10-CM | POA: Diagnosis not present

## 2018-10-30 DIAGNOSIS — I272 Pulmonary hypertension, unspecified: Secondary | ICD-10-CM

## 2018-10-30 MED ORDER — ALBUTEROL SULFATE (2.5 MG/3ML) 0.083% IN NEBU: 2.5000 mg | INHALATION_SOLUTION | RESPIRATORY_TRACT | 5 refills | Status: DC | PRN

## 2018-10-30 NOTE — Assessment & Plan Note (Signed)
She remains confused about her bronchodilator regimen.  Try to determine whether a retrial of Trelegy would be beneficial.  She is not sure if she ever benefited from it and she is also not sure whether she wants to replace her current regimen.  I will try to clarify for her.  Your scheduled inhaler regimen will be as follows: DuoNeb (albuterol-ipratropium) 4 times a day on a schedule Budesonide (Pulmicort) nebs 2 times a day on a schedule, with your first and last daily DuoNeb treatments Keep your albuterol (Ventolin) available to use 2 puffs if needed for shortness of breath, chest tightness, wheezing. Continue your oxygen at 6 to 10 L/min depending on your level of exertion. We will refer you back to pulmonary rehab Follow-up with cardiopulmonary at [facility] regarding your secondary pulmonary hypertension. Follow with APP in 2 months Follow with Dr Lamonte Sakai in 4 months or sooner if you have any problems

## 2018-10-30 NOTE — Assessment & Plan Note (Signed)
Following at [facility] for secondary pulmonary hypertension.  She might be considered for trial therapy through [facility]

## 2018-10-30 NOTE — Progress Notes (Signed)
Subjective:    Patient ID: Kayla Mack, female    DOB: [birth year], 73 y.o.   MRN: [id number]  COPD  Her past medical history is significant for COPD.    Spirometry 06/06/17 > severe obstruction Pulmonary function testing performed today 10/4 were reviewed by me. These showSevere obstruction with an FEV1 of 0.92 L (48% predicted), no bronchodilator response, hyperinflated volumes, severely decreased diffusion capacity. R heart cath 05/09/17: PAP 57/22 (38), PAOP 58  ROV 73 year old woman with very severe COPD, combined hypoxia and hypercapnic respiratory failure.  FEV1 0.92 L for this 48% predicted).  She has secondary pulmonary hypertension principally due to her pulmonary disease.  She uses a trilogy mechanical ventilator nightly.  She was admitted on 08/15/2018 with an acute exacerbation.,  Possible superimposed pneumonia.  She was discharged on nebulized therapy, I asked [facility] to change her back to Trelegy  She is actually using brovana / budesonide bid, albuterol nebs prn.  Not clear to me that she is using duoneb. She has high FiO2 needs > 6L/min up to 10L/min with her pulm rehab and exertion. She is planning to go to [place] today to discuss her [place].   ROV 10/30/18 --this a follow-up visit for 73 year old woman with very severe COPD, combined hypoxemic hypercapnic respiratory failure for which she has been treated with a trilogy mechanical ventilator that she uses each evening.  She also has secondary pulmonary hypertension. She reports that her breathing is stable since last visit but she remains confused about her BD. She is not taking DuoNeb on a schedule, uses prn. She has budesonide / brovana that she uses reliably BID. Her O2 is at 6 - 10L/min/.   She has undergone some evaluation for her [place] at [facility]: A 6-minute walk 11/15 showed a walk distance of 1296 feet on supplemental oxygen. Pulmonary function testing confirmed an FEV1 of 0.72 L, FVC 1.84 L, ratio 39%.  Total lung capacity  133% predicted, residual volume 207% predicted. V/Q scan 08/31/2018 was low probability for pulmonary embolism Looks like she is being considered for [facility] trial ([place])   Review of Systems As above      Objective:   Physical Exam Vitals:   10/30/18 1342  BP: 110/70  Pulse: 80  SpO2: 95%  Weight: 126 lb (57.2 kg)   Gen: Pleasant, thin ill appearing, in no distress,  normal affect  ENT: No lesions,  mouth clear,  oropharynx clear, no postnasal drip  Neck: No JVD, no stridor  Lungs: No use of accessory muscles, very distant, no wheezes   Cardiovascular: RRR, heart sounds normal, no murmur or gallops, no peripheral edema  Musculoskeletal: No deformities, no cyanosis or clubbing  Neuro: alert, non focal  Skin: Warm, no lesions or rash       Assessment & Plan:  COPD (chronic obstructive pulmonary disease) ([place]) She remains confused about her bronchodilator regimen.  Try to determine whether a retrial of Trelegy would be beneficial.  She is not sure if she ever benefited from it and she is also not sure whether she wants to replace her current regimen.  I will try to clarify for her.  Your scheduled inhaler regimen will be as follows: DuoNeb (albuterol-ipratropium) 4 times a day on a schedule Budesonide (Pulmicort) nebs 2 times a day on a schedule, with your first and last daily DuoNeb treatments Keep your albuterol (Ventolin) available to use 2 puffs if needed for shortness of breath, chest tightness, wheezing. Continue your oxygen at 6  to 10 L/min depending on your level of exertion. We will refer you back to pulmonary rehab Follow-up with cardiopulmonary at [facility] regarding your secondary pulmonary hypertension. Follow with APP in 2 months Follow with Dr Lamonte Sakai in 4 months or sooner if you have any problems  Pulmonary hypertension ([place]) Following at [facility] for secondary pulmonary hypertension.  She might be considered for trial therapy through [facility]  Chronic  respiratory failure with hypoxia (HCC) Severe hypoxemia.  Continue same 6 to 10 L/min.  She had a minor desaturation on her 6-minute walk at [facility] on 10 L/min.  Baltazar Apo, MD, PhD 10/30/2018, 2:08 PM  Pulmonary and Critical Care [phone] or if no answer [phone]

## 2018-10-30 NOTE — Assessment & Plan Note (Signed)
Severe hypoxemia.  Continue same 6 to 10 L/min.  She had a minor desaturation on her 6-minute walk at [facility] on 10 L/min.

## 2018-10-30 NOTE — Patient Instructions (Signed)
Your scheduled inhaler regimen will be as follows: DuoNeb (albuterol-ipratropium) 4 times a day on a schedule Budesonide (Pulmicort) nebs 2 times a day on a schedule, with your first and last daily DuoNeb treatments Keep your albuterol (Ventolin) available to use 2 puffs if needed for shortness of breath, chest tightness, wheezing. Continue your oxygen at 6 to 10 L/min depending on your level of exertion. We will refer you back to pulmonary rehab Follow-up with cardiopulmonary at [facility] regarding your secondary pulmonary hypertension. Follow with APP in 2 months Follow with Dr Lamonte Sakai in 4 months or sooner if you have any problems.

## 2018-11-02 DIAGNOSIS — F329 Major depressive disorder, single episode, unspecified: Secondary | ICD-10-CM | POA: Diagnosis not present

## 2018-11-02 DIAGNOSIS — F4323 Adjustment disorder with mixed anxiety and depressed mood: Secondary | ICD-10-CM | POA: Diagnosis not present

## 2018-11-06 ENCOUNTER — Telehealth (HOSPITAL_COMMUNITY): Payer: Self-pay

## 2018-11-06 NOTE — Telephone Encounter (Signed)
Called pt in regards to referral received for Pulmonary rehab. Called for clarification of plan of care after clinical review of chart. Requested return call.  Joycelyn Man RN, BSN Cardiac and Pulmonary Rehab RN

## 2018-11-09 ENCOUNTER — Telehealth (HOSPITAL_COMMUNITY): Payer: Self-pay

## 2018-11-09 DIAGNOSIS — F329 Major depressive disorder, single episode, unspecified: Secondary | ICD-10-CM | POA: Diagnosis not present

## 2018-11-09 DIAGNOSIS — F4323 Adjustment disorder with mixed anxiety and depressed mood: Secondary | ICD-10-CM | POA: Diagnosis not present

## 2018-11-09 NOTE — Telephone Encounter (Signed)
Clinical review of pt record and pulmonary office note on 10/30/18 with Dr. Lamonte Sakai. Spoke with pt about follow up testing with [facility]. Pt states that she has had to be delayed with testing d/t fractures of her back, but has no restrictions on her activity, just pain when lying down. Pt states that pain is well improved from initial diagnosis time as well. Informed pt of process for scheduling and pt verbalized understanding. Will forward to support staff for scheduling and verification of insurance eligibility/benefits with pt consent.   Joycelyn Man, RN, BSN Cardiac and Pulmonary Rehab Nurse

## 2018-11-14 DIAGNOSIS — F4323 Adjustment disorder with mixed anxiety and depressed mood: Secondary | ICD-10-CM | POA: Diagnosis not present

## 2018-11-14 DIAGNOSIS — F329 Major depressive disorder, single episode, unspecified: Secondary | ICD-10-CM | POA: Diagnosis not present

## 2018-11-20 ENCOUNTER — Telehealth: Payer: Self-pay | Admitting: Emergency Medicine

## 2018-11-20 NOTE — Telephone Encounter (Signed)
lmtcb x1 for [place] with [facility].

## 2018-11-20 NOTE — Telephone Encounter (Signed)
Olivia Mackie form [facility] is calling back [phone]

## 2018-11-20 NOTE — Telephone Encounter (Signed)
Kayla Mack stated that pt has samples of Trelegy at home and would like to try this, but wants to know if this interferes with her other medications, or if she needs to stop any other meds during this trial.  Kayla Mack is aware that [place] will be back in office until 11/22/18.  Kayla Mack also wanted to make RB aware that pt is only doing duoneb BID vs QID.  RB please advise. Thanks.

## 2018-11-20 NOTE — Telephone Encounter (Signed)
Kayla Mack with [facility] calling, stating that pt has samples of Trelegy at home and would like to try this, but wants to know if this interferes with her other medications, or if she needs to stop any other meds during this trial.    Kayla Mack CB: [phone]

## 2018-11-21 ENCOUNTER — Telehealth (HOSPITAL_COMMUNITY): Payer: Self-pay

## 2018-11-21 NOTE — Telephone Encounter (Signed)
Called patient to see if she was interested in participating in the Pulmonary Rehab Program. Patient stated she would like to stay in the Pulmonary Rehab maintenance program. Pass information over to PR maintenance coord.  Closed referral

## 2018-11-21 NOTE — Telephone Encounter (Signed)
Pt insurance is active and benefits verified through Medicare A&B Co-pay 0, DED 198.00/0 met, out of pocket 0/0 met, co-insurance 20%. no pre-authorization required. 2/4 @ 9:42am

## 2018-11-21 NOTE — Telephone Encounter (Signed)
Pt has secondary insurance A[employer] copay: 0 ded: 0 out of pocket: 0 coinsurance: 0% 2/4 @ 9:42am

## 2018-11-22 NOTE — Telephone Encounter (Signed)
Based on a review of her notes she was on DuoNeb (supposed to be 4 times daily) and Pulmicort nebs.  If she wants to do a trial of Trelegy then she needs to stop both of these on a schedule.  She can still use the DuoNeb on an as-needed basis up to every 6 hours if needed while she is on the Trelegy.

## 2018-11-22 NOTE — Telephone Encounter (Signed)
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 Pt will come in for orientation 11/27/2018 @ 11AM and attend our 1:30 class.  Mailed homework package.

## 2018-11-22 NOTE — Telephone Encounter (Signed)
Spoke with Kayla Mack. She is aware of RB's recommendations. She will talk with the patient about using the Trelegy. Advised her to call the office once she has made a final decision on her meds so we can update her chart, she verbalized understanding.   Nothing further needed at time of call.

## 2018-11-27 ENCOUNTER — Encounter (HOSPITAL_COMMUNITY)
Admission: RE | Admit: 2018-11-27 | Discharge: 2018-11-27 | Disposition: A | Discharge status: Home / Self Care | Payer: Medicare Other | Source: Ambulatory Visit | Attending: Emergency Medicine | Admitting: Emergency Medicine

## 2018-11-27 VITALS — BP 152/70 | HR 72 | Ht 62.0 in | Wt 135.6 lb

## 2018-11-27 DIAGNOSIS — J438 Other emphysema: Secondary | ICD-10-CM

## 2018-11-27 DIAGNOSIS — J432 Centrilobular emphysema: Secondary | ICD-10-CM | POA: Diagnosis not present

## 2018-11-27 NOTE — Progress Notes (Signed)
Kayla Mack 73 y.o. female Pulmonary Rehab Orientation Note Patient arrived today in Cardiac and Pulmonary Rehab for orientation to Pulmonary Rehab. She was transported from [employer] via wheel chair. She does carry portable oxygen. Per pt, she uses oxygen continuously. Color good, skin warm and dry. Patient is oriented to time and place. Patient's medical history, psychosocial health, and medications reviewed. Psychosocial assessment reveals pt lives alone. 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 Pt hobbies include sewing and painting pictures. Pt reports her stress level is high. 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  Pt  exhibits signs of depression. Signs of depression include sadness and fatigue. She sees a counselor weekly and takes an antidepressant and finds this to be extremely helpful.  Her support group is her church family and the friends she has made in pulmonary rehab in the past.  PHQ2/9 score 0/5. Pt shows good  coping skills with positive outlook .  Offered emotional support and reassurance. Will continue to monitor and evaluate progress toward psychosocial goal(s) of dealing with stress in a healthy manner. Physical assessment reveals heart rate is normal, breath sounds clear to auscultation, no wheezes, rales, or rhonchi. Grip strength equal, strong.  Patient reports she does take medications as prescribed. Patient states she follows a Regular diet. The patient reports no specific efforts to gain or lose weight.. Patient's weight will be monitored closely. Demonstration and practice of PLB using pulse oximeter. Patient able to return demonstration satisfactorily. Safety and hand hygiene in the exercise area reviewed with patient. Patient voices understanding of the information reviewed. Department expectations discussed with patient and achievable goals were set. The patient  shows enthusiasm about attending the program and we look forward to working with this nice lady. The patient is scheduled for a 6 min walk test on Tuesday, November 28, 2018 @ 3:15 pm and to begin exercise on Tuesday, December 05, 2018 in the 1:30 pm class.  [id number]

## 2018-11-28 ENCOUNTER — Ambulatory Visit (HOSPITAL_COMMUNITY): Payer: Self-pay

## 2018-11-29 DIAGNOSIS — F4323 Adjustment disorder with mixed anxiety and depressed mood: Secondary | ICD-10-CM | POA: Diagnosis not present

## 2018-11-29 DIAGNOSIS — F329 Major depressive disorder, single episode, unspecified: Secondary | ICD-10-CM | POA: Diagnosis not present

## 2018-11-30 ENCOUNTER — Encounter (HOSPITAL_COMMUNITY)
Admission: RE | Admit: 2018-11-30 | Discharge: 2018-11-30 | Disposition: A | Discharge status: Home / Self Care | Payer: Medicare Other | Source: Ambulatory Visit | Attending: Emergency Medicine | Admitting: Emergency Medicine

## 2018-11-30 DIAGNOSIS — I2510 Atherosclerotic heart disease of native coronary artery without angina pectoris: Secondary | ICD-10-CM | POA: Diagnosis not present

## 2018-11-30 DIAGNOSIS — J9611 Chronic respiratory failure with hypoxia: Secondary | ICD-10-CM | POA: Diagnosis not present

## 2018-11-30 DIAGNOSIS — J441 Chronic obstructive pulmonary disease with (acute) exacerbation: Secondary | ICD-10-CM | POA: Diagnosis not present

## 2018-11-30 DIAGNOSIS — J438 Other emphysema: Secondary | ICD-10-CM

## 2018-11-30 DIAGNOSIS — J432 Centrilobular emphysema: Secondary | ICD-10-CM | POA: Diagnosis not present

## 2018-11-30 DIAGNOSIS — I272 Pulmonary hypertension, unspecified: Secondary | ICD-10-CM | POA: Diagnosis not present

## 2018-11-30 NOTE — Progress Notes (Signed)
Pulmonary Individual Treatment Plan  Patient Details  Name: Kayla Mack MRN: [id number] Date of Birth: Sep 13, 1946 Referring Provider:     Pulmonary Rehab Walk Test from 11/30/2018 in [facility]  Referring Provider  Dr. Lamonte Sakai      Initial Encounter Date:    Pulmonary Rehab Walk Test from 11/30/2018 in [facility]  Date  11/30/18      Visit Diagnosis: Other emphysema ([place])  Pulmonary hypertension ([place])  Patient's Home Medications on Admission:   Current Outpatient Medications:  .  acetaminophen (TYLENOL) 500 MG tablet, Take 1,000 mg by mouth 2 (two) times daily as needed for pain. , Disp: , Rfl:  .  albuterol (PROVENTIL HFA;VENTOLIN HFA) 108 (90 Base) MCG/ACT inhaler, Inhale 2 puffs into the lungs every 6 (six) hours as needed for wheezing or shortness of breath., Disp: , Rfl:  .  bisoprolol (ZEBETA) 10 MG tablet, Take 1 tablet (10 mg total) by mouth daily., Disp: 30 tablet, Rfl: 1 .  budesonide (PULMICORT) 0.25 MG/2ML nebulizer solution, Take 0.25 mg by nebulization 2 (two) times daily., Disp: , Rfl:  .  calcium-vitamin D (OSCAL WITH D) 500-200 MG-UNIT per tablet, Take 1 tablet by mouth 2 (two) times daily. (Patient taking differently: Take 1 tablet by mouth daily. ), Disp: , Rfl:  .  clonazePAM (KLONOPIN) 0.5 MG tablet, Take 1 tablet (0.5 mg total) by mouth 2 (two) times daily as needed for anxiety. (Patient taking differently: Take 0.5 mg by mouth 3 times/day as needed-between meals & bedtime for anxiety (sleep). ), Disp: 10 tablet, Rfl: 0 .  clopidogrel (PLAVIX) 75 MG tablet, Take 1 tablet (75 mg total) by mouth daily., Disp: 30 tablet, Rfl: 0 .  Diphenhyd-Hydrocort-Nystatin (FIRST-DUKES MOUTHWASH) SUSP, Gargle and swallow 1 tsp four times daily (Patient not taking: Reported on 11/27/2018), Disp: 240 mL, Rfl: 0 .  diphenhydrAMINE (BENADRYL) 25 mg capsule, Take 25 mg by mouth every 6 (six) hours as needed for  allergies. , Disp: , Rfl:  .  fluticasone (FLONASE) 50 MCG/ACT nasal spray, Place 2 sprays into both nostrils daily., Disp: 16 g, Rfl: 0 .  guaiFENesin (MUCINEX) 600 MG 12 hr tablet, Take 1 tablet (600 mg total) by mouth 2 (two) times daily., Disp: 30 tablet, Rfl: 0 .  ipratropium-albuterol (DUONEB) 0.5-2.5 (3) MG/3ML SOLN, Take 3 mLs by nebulization daily. Dx: J44.9 (Patient not taking: Reported on 11/27/2018), Disp: 90 mL, Rfl: 5 .  loratadine (CLARITIN) 10 MG tablet, Take 1 tablet (10 mg total) by mouth daily. (Patient not taking: Reported on 11/27/2018), Disp: 30 tablet, Rfl: 0 .  lovastatin (MEVACOR) 40 MG tablet, Take 40 mg by mouth every morning., Disp: , Rfl:  .  PARoxetine (PAXIL) 20 MG tablet, Take 20 mg by mouth daily., Disp: , Rfl:  .  sodium chloride (OCEAN) 0.65 % SOLN nasal spray, Place 1 spray into both nostrils as needed for congestion., Disp: , Rfl:  .  traMADol (ULTRAM) 50 MG tablet, 1/2 tab - 1 tab every 8 hours as needed for back pain (Patient not taking: Reported on 11/27/2018), Disp: 15 tablet, Rfl: 0  Past Medical History: Past Medical History:  Diagnosis Date  . Anxiety   . COPD (chronic obstructive pulmonary disease) ([place])   . Depression   . Hyperlipidemia   . Hypertension     Tobacco Use: Social History   Tobacco Use  Smoking Status Former Smoker  . Packs/day: 1.50  . Years: 40.00  .  Pack years: 60.00  . Types: Cigarettes  . Last attempt to quit: 03/18/2017  . Years since quitting: 1.7  Smokeless Tobacco Never Used    Labs: Recent Review Scientist, physiological    Labs for ITP Cardiac and Pulmonary Rehab Latest Ref Rng & Units 05/09/2017 04/02/2018 07/26/2018 07/26/2018 08/15/2018   Cholestrol 0 - 200 mg/dL - - - - -   LDLCALC 0 - 99 mg/dL - - - - -   HDL >40 mg/dL - - - - -   Trlycerides <150 mg/dL - - - - -   Hemoglobin A1c 4.8 - 5.6 % - - - - -   PHART 7.350 - 7.450 - 7.202(L) - - -   PCO2ART 32.0 - 48.0 mmHg - 68.3(HH) - - -   HCO3 20.0 - 28.0 mmol/L 39.0(H)  27.0 - 21.4 29.0(H)   TCO2 22 - 32 mmol/L 41 _0 -   ACIDBASEDEF 0.0 - 2.0 mmol/L - 3.0(H) - 12.0(H) -   O2SAT % 65.0 98.0 - 99.0 97.9      Capillary Blood Glucose: Lab Results  Component Value Date   GLUCAP 82 07/30/2018   GLUCAP 117 (H) 07/29/2018   GLUCAP 117 (H) 07/29/2018   GLUCAP 102 (H) 07/29/2018   GLUCAP 122 (H) 07/29/2018     Pulmonary Assessment Scores: Pulmonary Assessment Scores    Row Name 11/30/18 1624         ADL UCSD   ADL Phase  Entry       mMRC Score   mMRC Score  4        Pulmonary Function Assessment: Pulmonary Function Assessment - 11/27/18 1224      Breath   Bilateral Breath Sounds  Clear    Shortness of Breath  Panic with Shortness of Breath;Limiting activity;Fear of Shortness of Breath;Yes       Exercise Target Goals: Exercise Program Goal: Individual exercise prescription set using results from initial 6 min walk test and THRR while considering  patient's activity barriers and safety.   Exercise Prescription Goal: Initial exercise prescription builds to 30-45 minutes a day of aerobic activity, 2-3 days per week.  Home exercise guidelines will be given to patient during program as part of exercise prescription that the participant will acknowledge.  Activity Barriers & Risk Stratification: Activity Barriers & Cardiac Risk Stratification - 11/27/18 1219      Activity Barriers & Cardiac Risk Stratification   Activity Barriers  None       6 Minute Walk: 6 Minute Walk    Row Name 11/30/18 1617         6 Minute Walk   Phase  Initial     Distance  1310 feet     Walk Time  6 minutes     # of Rest Breaks  0     MPH  2.48     METS  2.91     RPE  13     Perceived Dyspnea   3     Symptoms  No     Resting HR  73 bpm     Resting BP  128/64     Resting Oxygen Saturation   92 %     Exercise Oxygen Saturation  during 6 min walk  82 %     Max Ex. HR  105 bpm     Max Ex. BP  150/72       Interval HR   1 Minute HR  85  2  Minute HR  94     3 Minute HR  101     4 Minute HR  104     5 Minute HR  103     6 Minute HR  105     2 Minute Post HR  87     Interval Heart Rate?  Yes       Interval Oxygen   Interval Oxygen?  Yes     Baseline Oxygen Saturation %  92 %     1 Minute Oxygen Saturation %  94 %     1 Minute Liters of Oxygen  10 L     2 Minute Oxygen Saturation %  91 %     2 Minute Liters of Oxygen  10 L     3 Minute Oxygen Saturation %  90 %     3 Minute Liters of Oxygen  10 L     4 Minute Oxygen Saturation %  89 %     4 Minute Liters of Oxygen  10 L     5 Minute Oxygen Saturation %  86 %     5 Minute Liters of Oxygen  10 L     6 Minute Oxygen Saturation %  82 %     6 Minute Liters of Oxygen  10 L     2 Minute Post Oxygen Saturation %  86 %     2 Minute Post Liters of Oxygen  10 L        Oxygen Initial Assessment: Oxygen Initial Assessment - 11/30/18 1623      Initial 6 min Walk   Oxygen Used  Continuous;E-Tanks    Liters per minute  10      Program Oxygen Prescription   Program Oxygen Prescription  Continuous;E-Tanks    Liters per minute  10       Oxygen Re-Evaluation:   Oxygen Discharge (Final Oxygen Re-Evaluation):   Initial Exercise Prescription: Initial Exercise Prescription - 11/30/18 1600      Date of Initial Exercise RX and Referring Provider   Date  11/30/18    Referring Provider  Dr. Lamonte Sakai      Oxygen   Oxygen  Continuous    Liters  10      Recumbant Bike   Level  2    Watts  10    Minutes  17      NuStep   Level  2    SPM  80    Minutes  17    METs  1.5      Track   Laps  10    Minutes  17      Prescription Details   Frequency (times per week)  2    Duration  Progress to 45 minutes of aerobic exercise without signs/symptoms of physical distress      Intensity   THRR 40-80% of Max Heartrate  58-117    Ratings of Perceived Exertion  11-13    Perceived Dyspnea  0-4      Progression   Progression  Continue progressive overload as per policy  without signs/symptoms or physical distress.      Resistance Training   Training Prescription  Yes    Weight  orange bands    Reps  10-15       Perform Capillary Blood Glucose checks as needed.  Exercise Prescription Changes:   Exercise Comments:   Exercise Goals and Review:   Exercise Goals Re-Evaluation :  Discharge Exercise Prescription (Final Exercise Prescription Changes):   Nutrition:  Target Goals: Understanding of nutrition guidelines, daily intake of sodium <158m, cholesterol <2036m calories 30% from fat and 7% or less from saturated fats, daily to have 5 or more servings of fruits and vegetables.  Biometrics: Pre Biometrics - 11/27/18 1220      Pre Biometrics   Grip Strength  22 kg        Nutrition Therapy Plan and Nutrition Goals:   Nutrition Assessments:   Nutrition Goals Re-Evaluation:   Nutrition Goals Discharge (Final Nutrition Goals Re-Evaluation):   Psychosocial: Target Goals: Acknowledge presence or absence of significant depression and/or stress, maximize coping skills, provide positive support system. Participant is able to verbalize types and ability to use techniques and skills needed for reducing stress and depression.  Initial Review & Psychosocial Screening: Initial Psych Review & Screening - 11/27/18 1225      Initial Review   Current issues with  Current Depression;History of Depression;Current Anxiety/Panic;Current Stress Concerns   is the executer for both deceased parents which is stressful   Source of Stress Concerns  Chronic Illness;Family;Unable to participate in former interests or hobbies;Unable to perform yard/household activities    Comments  executer of both parents which is very stressful      FaBlyn No   sees a coSocial workernd has a large church family   Strains  Intra-family strains   neices are estranged      Barriers   Psychosocial barriers to participate in program  The  patient should benefit from training in stress management and relaxation.   sees a coRetail bankereekly     Screening Interventions   Interventions  Encouraged to exercise    Comments  currently sees a counsellor weekly and is active in church    Expected Outcomes  Long Term Goal: Stressors or current issues are controlled or eliminated.;Long Term goal: The participant improves quality of Life and PHQ9 Scores as seen by post scores and/or verbalization of changes       Quality of Life Scores:  Scores of 19 and below usually indicate a poorer quality of life in these areas.  A difference of  2-3 points is a clinically meaningful difference.  A difference of 2-3 points in the total score of the Quality of Life Index has been associated with significant improvement in overall quality of life, self-image, physical symptoms, and general health in studies assessing change in quality of life.  PHQ-9: Recent Review Flowsheet Data    Depression screen [id number] 11/27/2018 08/25/2018 10/25/2017 08/29/2017 07/27/2017   Decreased Interest 0 0 _0 Down, Depressed, Hopeless _1 PHQ - 2 Score _2 Altered sleeping 0 - - 0 0   Tired, decreased energy 2 - - 1 1   Change in appetite 0 - - 0 1   Feeling bad or failure about yourself  1 - - 0 0   Trouble concentrating 0 - - 0 1   Moving slowly or fidgety/restless 0 - - 0 0   Suicidal thoughts 0 - - 0 0   PHQ-9 Score 5 - - 3 5   Difficult doing work/chores Somewhat difficult - - Somewhat difficult -     Interpretation of Total Score  Total Score Depression Severity:  1-4 = Minimal depression, 5-9 = Mild depression, 10-14 = Moderate depression, 15-19 = Moderately  severe depression, 20-27 = Severe depression   Psychosocial Evaluation and Intervention: Psychosocial Evaluation - 11/27/18 1230      Psychosocial Evaluation & Interventions   Interventions  Stress management education;Relaxation education;Encouraged to exercise with the program  and follow exercise prescription    Comments  is dealing with her stressors well with the counsellor and medications    Expected Outcomes  psychosocial issues will not become a barrier to participation to pulmonary rehab    Continue Psychosocial Services   Follow up required by counselor       Psychosocial Re-Evaluation:   Psychosocial Discharge (Final Psychosocial Re-Evaluation):   Education: Education Goals: Education classes will be provided on a weekly basis, covering required topics. Participant will state understanding/return demonstration of topics presented.  Learning Barriers/Preferences: Learning Barriers/Preferences - 11/27/18 1232      Learning Barriers/Preferences   Learning Barriers  None    Learning Preferences  Audio;Group Instruction;Computer/Internet;Individual Instruction;Skilled Demonstration;Pictoral;Verbal Instruction;Video;Written Material       Education Topics: Risk Factor Reduction:  -Group instruction that is supported by a PowerPoint presentation. Instructor discusses the definition of a risk factor, different risk factors for pulmonary disease, and how the heart and lungs work together.     Nutrition for Pulmonary Patient:  -Group instruction provided by PowerPoint slides, verbal discussion, and written materials to support subject matter. The instructor gives an explanation and review of healthy diet recommendations, which includes a discussion on weight management, recommendations for fruit and vegetable consumption, as well as protein, fluid, caffeine, fiber, sodium, sugar, and alcohol. Tips for eating when patients are short of breath are discussed.   PULMONARY REHAB OTHER RESPIRATORY from 10/20/2017 in [facility]  Date  10/20/17  Educator  edna  Instruction Review Code (Retired)  2- meets goals/outcomes      Pursed Lip Breathing:  -Group instruction that is supported by demonstration and informational handouts.  Instructor discusses the benefits of pursed lip and diaphragmatic breathing and detailed demonstration on how to preform both.     Oxygen Safety:  -Group instruction provided by PowerPoint, verbal discussion, and written material to support subject matter. There is an overview of "What is Oxygen" and "Why do we need it".  Instructor also reviews how to create a safe environment for oxygen use, the importance of using oxygen as prescribed, and the risks of noncompliance. There is a brief discussion on traveling with oxygen and resources the patient may utilize.   PULMONARY REHAB OTHER RESPIRATORY from 10/20/2017 in [facility]  Date  08/11/17  Educator  Truddie Crumble  Instruction Review Code (Retired)  2- meets goals/outcomes      Oxygen Equipment:  -Group instruction provided by [employer], Network engineer, and Insurance underwriter.   PULMONARY REHAB OTHER RESPIRATORY from 10/20/2017 in [facility]  Date  09/15/17  Educator  [employer]  Instruction Review Code (Retired)  2- meets goals/outcomes      Signs and Symptoms:  -Group instruction provided by written material and verbal discussion to support subject matter. Warning signs and symptoms of infection, stroke, and heart attack are reviewed and when to call the physician/911 reinforced. Tips for preventing the spread of infection discussed.   Advanced Directives:  -Group instruction provided by verbal instruction and written material to support subject matter. Instructor reviews Advanced Directive laws and proper instruction for filling out document.   Pulmonary Video:  -Group video education that reviews the importance of  medication and oxygen compliance, exercise, good nutrition, pulmonary hygiene, and pursed lip and diaphragmatic breathing for the pulmonary patient.   PULMONARY REHAB OTHER RESPIRATORY from 10/20/2017 in [facility]  Date  07/14/17  Instruction Review Code (Retired)  2- meets goals/outcomes      Exercise for the Pulmonary Patient:  -Group instruction that is supported by a PowerPoint presentation. Instructor discusses benefits of exercise, core components of exercise, frequency, duration, and intensity of an exercise routine, importance of utilizing pulse oximetry during exercise, safety while exercising, and options of places to exercise outside of rehab.     Pulmonary Medications:  -Verbally interactive group education provided by instructor with focus on inhaled medications and proper administration.   PULMONARY REHAB OTHER RESPIRATORY from 10/20/2017 in [facility]  Date  08/18/17  Educator  Pharm D  Instruction Review Code (Retired)  2- Lawyer and Physiology of the Respiratory System and Intimacy:  -Group instruction provided by PowerPoint, verbal discussion, and written material to support subject matter. Instructor reviews respiratory cycle and anatomical components of the respiratory system and their functions. Instructor also reviews differences in obstructive and restrictive respiratory diseases with examples of each. Intimacy, Sex, and Sexuality differences are reviewed with a discussion on how relationships can change when diagnosed with pulmonary disease. Common sexual concerns are reviewed.   MD DAY -A group question and answer session with a medical doctor that allows participants to ask questions that relate to their pulmonary disease state.   PULMONARY REHAB OTHER RESPIRATORY from 10/20/2017 in [place]  Date  07/26/17  Educator  Dr. Nelda Marseille  Instruction Review Code (Retired)  2- meets goals/outcomes      OTHER EDUCATION -Group or individual verbal, written, or video instructions that support the educational goals of the pulmonary rehab program.   Holiday Eating  Survival Tips:  -Group instruction provided by PowerPoint slides, verbal discussion, and written materials to support subject matter. The instructor gives patients tips, tricks, and techniques to help them not only survive but enjoy the holidays despite the onslaught of food that accompanies the holidays.   Knowledge Questionnaire Score:   Core Components/Risk Factors/Patient Goals at Admission: Personal Goals and Risk Factors at Admission - 11/27/18 1233      Core Components/Risk Factors/Patient Goals on Admission   Improve shortness of breath with ADL's  Yes    Intervention  Provide education, individualized exercise plan and daily activity instruction to help decrease symptoms of SOB with activities of daily living.    Expected Outcomes  Short Term: Improve cardiorespiratory fitness to achieve a reduction of symptoms when performing ADLs;Long Term: Be able to perform more ADLs without symptoms or delay the onset of symptoms       Core Components/Risk Factors/Patient Goals Review:  Goals and Risk Factor Review    Row Name 11/27/18 1234             Core Components/Risk Factors/Patient Goals Review   Personal Goals Review  Develop more efficient breathing techniques such as purse lipped breathing and diaphragmatic breathing and practicing self-pacing with activity.;Improve shortness of breath with ADL's;Increase knowledge of respiratory medications and ability to use respiratory devices properly.          Core Components/Risk Factors/Patient Goals at Discharge (Final Review):  Goals and Risk Factor Review - 11/27/18 1234      Core Components/Risk Factors/Patient Goals Review   Personal  Goals Review  Develop more efficient breathing techniques such as purse lipped breathing and diaphragmatic breathing and practicing self-pacing with activity.;Improve shortness of breath with ADL's;Increase knowledge of respiratory medications and ability to use respiratory devices properly.        ITP Comments:   Comments:

## 2018-12-01 DIAGNOSIS — L814 Other melanin hyperpigmentation: Secondary | ICD-10-CM | POA: Diagnosis not present

## 2018-12-01 DIAGNOSIS — L720 Epidermal cyst: Secondary | ICD-10-CM | POA: Diagnosis not present

## 2018-12-01 DIAGNOSIS — L821 Other seborrheic keratosis: Secondary | ICD-10-CM | POA: Diagnosis not present

## 2018-12-01 DIAGNOSIS — L570 Actinic keratosis: Secondary | ICD-10-CM | POA: Diagnosis not present

## 2018-12-01 DIAGNOSIS — Z803 Family history of malignant neoplasm of breast: Secondary | ICD-10-CM | POA: Diagnosis not present

## 2018-12-01 DIAGNOSIS — D225 Melanocytic nevi of trunk: Secondary | ICD-10-CM | POA: Diagnosis not present

## 2018-12-01 DIAGNOSIS — Z85828 Personal history of other malignant neoplasm of skin: Secondary | ICD-10-CM | POA: Diagnosis not present

## 2018-12-01 DIAGNOSIS — Z1231 Encounter for screening mammogram for malignant neoplasm of breast: Secondary | ICD-10-CM | POA: Diagnosis not present

## 2018-12-04 DIAGNOSIS — F329 Major depressive disorder, single episode, unspecified: Secondary | ICD-10-CM | POA: Diagnosis not present

## 2018-12-04 DIAGNOSIS — F4323 Adjustment disorder with mixed anxiety and depressed mood: Secondary | ICD-10-CM | POA: Diagnosis not present

## 2018-12-05 ENCOUNTER — Encounter (HOSPITAL_COMMUNITY): Admission: RE | Admit: 2018-12-05 | Payer: Medicare Other | Source: Ambulatory Visit

## 2018-12-07 ENCOUNTER — Encounter (HOSPITAL_COMMUNITY): Payer: Medicare Other

## 2018-12-07 NOTE — Progress Notes (Signed)
Pulmonary Individual Treatment Plan  Patient Details  Name: Kayla Mack MRN: [id number] Date of Birth: 1945-11-29 Referring Provider:     Pulmonary Rehab Walk Test from 11/30/2018 in [place]  Referring Provider  Dr. Lamonte Sakai      Initial Encounter Date:    Pulmonary Rehab Walk Test from 11/30/2018 in [place]  Date  11/30/18      Visit Diagnosis: Other emphysema ([place])  Pulmonary hypertension ([place])  Patient's Home Medications on Admission:   Current Outpatient Medications:  .  acetaminophen (TYLENOL) 500 MG tablet, Take 1,000 mg by mouth 2 (two) times daily as needed for pain. , Disp: , Rfl:  .  albuterol (PROVENTIL HFA;VENTOLIN HFA) 108 (90 Base) MCG/ACT inhaler, Inhale 2 puffs into the lungs every 6 (six) hours as needed for wheezing or shortness of breath., Disp: , Rfl:  .  bisoprolol (ZEBETA) 10 MG tablet, Take 1 tablet (10 mg total) by mouth daily., Disp: 30 tablet, Rfl: 1 .  budesonide (PULMICORT) 0.25 MG/2ML nebulizer solution, Take 0.25 mg by nebulization 2 (two) times daily., Disp: , Rfl:  .  calcium-vitamin D (OSCAL WITH D) 500-200 MG-UNIT per tablet, Take 1 tablet by mouth 2 (two) times daily. (Patient taking differently: Take 1 tablet by mouth daily. ), Disp: , Rfl:  .  clonazePAM (KLONOPIN) 0.5 MG tablet, Take 1 tablet (0.5 mg total) by mouth 2 (two) times daily as needed for anxiety. (Patient taking differently: Take 0.5 mg by mouth 3 times/day as needed-between meals & bedtime for anxiety (sleep). ), Disp: 10 tablet, Rfl: 0 .  clopidogrel (PLAVIX) 75 MG tablet, Take 1 tablet (75 mg total) by mouth daily., Disp: 30 tablet, Rfl: 0 .  Diphenhyd-Hydrocort-Nystatin (FIRST-DUKES MOUTHWASH) SUSP, Gargle and swallow 1 tsp four times daily (Patient not taking: Reported on 11/27/2018), Disp: 240 mL, Rfl: 0 .  diphenhydrAMINE (BENADRYL) 25 mg capsule, Take 25 mg by mouth every 6 (six) hours as needed for  allergies. , Disp: , Rfl:  .  fluticasone (FLONASE) 50 MCG/ACT nasal spray, Place 2 sprays into both nostrils daily., Disp: 16 g, Rfl: 0 .  guaiFENesin (MUCINEX) 600 MG 12 hr tablet, Take 1 tablet (600 mg total) by mouth 2 (two) times daily., Disp: 30 tablet, Rfl: 0 .  ipratropium-albuterol (DUONEB) 0.5-2.5 (3) MG/3ML SOLN, Take 3 mLs by nebulization daily. Dx: J44.9 (Patient not taking: Reported on 11/27/2018), Disp: 90 mL, Rfl: 5 .  loratadine (CLARITIN) 10 MG tablet, Take 1 tablet (10 mg total) by mouth daily. (Patient not taking: Reported on 11/27/2018), Disp: 30 tablet, Rfl: 0 .  lovastatin (MEVACOR) 40 MG tablet, Take 40 mg by mouth every morning., Disp: , Rfl:  .  PARoxetine (PAXIL) 20 MG tablet, Take 20 mg by mouth daily., Disp: , Rfl:  .  sodium chloride (OCEAN) 0.65 % SOLN nasal spray, Place 1 spray into both nostrils as needed for congestion., Disp: , Rfl:  .  traMADol (ULTRAM) 50 MG tablet, 1/2 tab - 1 tab every 8 hours as needed for back pain (Patient not taking: Reported on 11/27/2018), Disp: 15 tablet, Rfl: 0  Past Medical History: Past Medical History:  Diagnosis Date  . Anxiety   . COPD (chronic obstructive pulmonary disease) ([place])   . Depression   . Hyperlipidemia   . Hypertension     Tobacco Use: Social History   Tobacco Use  Smoking Status Former Smoker  . Packs/day: 1.50  . Years: 40.00  .  Pack years: 60.00  . Types: Cigarettes  . Last attempt to quit: 03/18/2017  . Years since quitting: 1.7  Smokeless Tobacco Never Used    Labs: Recent Review Scientist, physiological    Labs for ITP Cardiac and Pulmonary Rehab Latest Ref Rng & Units 05/09/2017 04/02/2018 07/26/2018 07/26/2018 08/15/2018   Cholestrol 0 - 200 mg/dL - - - - -   LDLCALC 0 - 99 mg/dL - - - - -   HDL >40 mg/dL - - - - -   Trlycerides <150 mg/dL - - - - -   Hemoglobin A1c 4.8 - 5.6 % - - - - -   PHART 7.350 - 7.450 - 7.202(L) - - -   PCO2ART 32.0 - 48.0 mmHg - 68.3(HH) - - -   HCO3 20.0 - 28.0 mmol/L 39.0(H)  27.0 - 21.4 29.0(H)   TCO2 22 - 32 mmol/L 41 _0 -   ACIDBASEDEF 0.0 - 2.0 mmol/L - 3.0(H) - 12.0(H) -   O2SAT % 65.0 98.0 - 99.0 97.9      Capillary Blood Glucose: Lab Results  Component Value Date   GLUCAP 82 07/30/2018   GLUCAP 117 (H) 07/29/2018   GLUCAP 117 (H) 07/29/2018   GLUCAP 102 (H) 07/29/2018   GLUCAP 122 (H) 07/29/2018     Pulmonary Assessment Scores: Pulmonary Assessment Scores    Row Name 11/30/18 1624         ADL UCSD   ADL Phase  Entry       mMRC Score   mMRC Score  4        Pulmonary Function Assessment: Pulmonary Function Assessment - 11/27/18 1224      Breath   Bilateral Breath Sounds  Clear    Shortness of Breath  Panic with Shortness of Breath;Limiting activity;Fear of Shortness of Breath;Yes       Exercise Target Goals: Exercise Program Goal: Individual exercise prescription set using results from initial 6 min walk test and THRR while considering  patient's activity barriers and safety.   Exercise Prescription Goal: Initial exercise prescription builds to 30-45 minutes a day of aerobic activity, 2-3 days per week.  Home exercise guidelines will be given to patient during program as part of exercise prescription that the participant will acknowledge.  Activity Barriers & Risk Stratification: Activity Barriers & Cardiac Risk Stratification - 11/27/18 1219      Activity Barriers & Cardiac Risk Stratification   Activity Barriers  None       6 Minute Walk: 6 Minute Walk    Row Name 11/30/18 1617         6 Minute Walk   Phase  Initial     Distance  1310 feet     Walk Time  6 minutes     # of Rest Breaks  0     MPH  2.48     METS  2.91     RPE  13     Perceived Dyspnea   3     Symptoms  No     Resting HR  73 bpm     Resting BP  128/64     Resting Oxygen Saturation   92 %     Exercise Oxygen Saturation  during 6 min walk  82 %     Max Ex. HR  105 bpm     Max Ex. BP  150/72       Interval HR   1 Minute HR  85  2  Minute HR  94     3 Minute HR  101     4 Minute HR  104     5 Minute HR  103     6 Minute HR  105     2 Minute Post HR  87     Interval Heart Rate?  Yes       Interval Oxygen   Interval Oxygen?  Yes     Baseline Oxygen Saturation %  92 %     1 Minute Oxygen Saturation %  94 %     1 Minute Liters of Oxygen  10 L     2 Minute Oxygen Saturation %  91 %     2 Minute Liters of Oxygen  10 L     3 Minute Oxygen Saturation %  90 %     3 Minute Liters of Oxygen  10 L     4 Minute Oxygen Saturation %  89 %     4 Minute Liters of Oxygen  10 L     5 Minute Oxygen Saturation %  86 %     5 Minute Liters of Oxygen  10 L     6 Minute Oxygen Saturation %  82 %     6 Minute Liters of Oxygen  10 L     2 Minute Post Oxygen Saturation %  86 %     2 Minute Post Liters of Oxygen  10 L        Oxygen Initial Assessment: Oxygen Initial Assessment - 11/30/18 1623      Initial 6 min Walk   Oxygen Used  Continuous;E-Tanks    Liters per minute  10      Program Oxygen Prescription   Program Oxygen Prescription  Continuous;E-Tanks    Liters per minute  10       Oxygen Re-Evaluation:   Oxygen Discharge (Final Oxygen Re-Evaluation):   Initial Exercise Prescription: Initial Exercise Prescription - 11/30/18 1600      Date of Initial Exercise RX and Referring Provider   Date  11/30/18    Referring Provider  Dr. Lamonte Sakai      Oxygen   Oxygen  Continuous    Liters  10      Recumbant Bike   Level  2    Watts  10    Minutes  17      NuStep   Level  2    SPM  80    Minutes  17    METs  1.5      Track   Laps  10    Minutes  17      Prescription Details   Frequency (times per week)  2    Duration  Progress to 45 minutes of aerobic exercise without signs/symptoms of physical distress      Intensity   THRR 40-80% of Max Heartrate  58-117    Ratings of Perceived Exertion  11-13    Perceived Dyspnea  0-4      Progression   Progression  Continue progressive overload as per policy  without signs/symptoms or physical distress.      Resistance Training   Training Prescription  Yes    Weight  orange bands    Reps  10-15       Perform Capillary Blood Glucose checks as needed.  Exercise Prescription Changes:   Exercise Comments:   Exercise Goals and Review:   Exercise Goals Re-Evaluation :  Discharge Exercise Prescription (Final Exercise Prescription Changes):   Nutrition:  Target Goals: Understanding of nutrition guidelines, daily intake of sodium <1587m, cholesterol <2028m calories 30% from fat and 7% or less from saturated fats, daily to have 5 or more servings of fruits and vegetables.  Biometrics: Pre Biometrics - 11/27/18 1220      Pre Biometrics   Grip Strength  22 kg        Nutrition Therapy Plan and Nutrition Goals:   Nutrition Assessments:   Nutrition Goals Re-Evaluation:   Nutrition Goals Discharge (Final Nutrition Goals Re-Evaluation):   Psychosocial: Target Goals: Acknowledge presence or absence of significant depression and/or stress, maximize coping skills, provide positive support system. Participant is able to verbalize types and ability to use techniques and skills needed for reducing stress and depression.  Initial Review & Psychosocial Screening: Initial Psych Review & Screening - 11/27/18 1225      Initial Review   Current issues with  Current Depression;History of Depression;Current Anxiety/Panic;Current Stress Concerns   is the executer for both deceased parents which is stressful   Source of Stress Concerns  Chronic Illness;Family;Unable to participate in former interests or hobbies;Unable to perform yard/household activities    Comments  executer of both parents which is very stressful      [place] No   sees a coSocial workernd has a large church family   Strains  Intra-family strains   neices are estranged      Barriers   Psychosocial barriers to participate in program  The  patient should benefit from training in stress management and relaxation.   sees a coRetail bankereekly     Screening Interventions   Interventions  Encouraged to exercise    Comments  currently sees a counsellor weekly and is active in church    Expected Outcomes  Long Term Goal: Stressors or current issues are controlled or eliminated.;Long Term goal: The participant improves quality of Life and PHQ9 Scores as seen by post scores and/or verbalization of changes       Quality of Life Scores:  Scores of 19 and below usually indicate a poorer quality of life in these areas.  A difference of  2-3 points is a clinically meaningful difference.  A difference of 2-3 points in the total score of the Quality of Life Index has been associated with significant improvement in overall quality of life, self-image, physical symptoms, and general health in studies assessing change in quality of life.  PHQ-9: Recent Review Flowsheet Data    [facility] 11/27/2018 08/25/2018 10/25/2017 08/29/2017 07/27/2017   Decreased Interest 0 0 _0 Down, Depressed, Hopeless _1 PHQ - 2 Score _2 Altered sleeping 0 - - 0 0   Tired, decreased energy 2 - - 1 1   Change in appetite 0 - - 0 1   Feeling bad or failure about yourself  1 - - 0 0   Trouble concentrating 0 - - 0 1   Moving slowly or fidgety/restless 0 - - 0 0   Suicidal thoughts 0 - - 0 0   PHQ-9 Score 5 - - 3 5   Difficult doing work/chores Somewhat difficult - - Somewhat difficult -     Interpretation of Total Score  Total Score Depression Severity:  1-4 = Minimal depression, 5-9 = Mild depression, 10-14 = Moderate depression, 15-19 = Moderately  severe depression, 20-27 = Severe depression   Psychosocial Evaluation and Intervention: Psychosocial Evaluation - 11/27/18 1230      Psychosocial Evaluation & Interventions   Interventions  Stress management education;Relaxation education;Encouraged to exercise with the program  and follow exercise prescription    Comments  is dealing with her stressors well with the counsellor and medications    Expected Outcomes  psychosocial issues will not become a barrier to participation to pulmonary rehab    Continue Psychosocial Services   Follow up required by counselor       Psychosocial Re-Evaluation:   Psychosocial Discharge (Final Psychosocial Re-Evaluation):   Education: Education Goals: Education classes will be provided on a weekly basis, covering required topics. Participant will state understanding/return demonstration of topics presented.  Learning Barriers/Preferences: Learning Barriers/Preferences - 11/27/18 1232      Learning Barriers/Preferences   Learning Barriers  None    Learning Preferences  Audio;Group Instruction;Computer/Internet;Individual Instruction;Skilled Demonstration;Pictoral;Verbal Instruction;Video;Written Material       Education Topics: Risk Factor Reduction:  -Group instruction that is supported by a PowerPoint presentation. Instructor discusses the definition of a risk factor, different risk factors for pulmonary disease, and how the heart and lungs work together.     Nutrition for Pulmonary Patient:  -Group instruction provided by PowerPoint slides, verbal discussion, and written materials to support subject matter. The instructor gives an explanation and review of healthy diet recommendations, which includes a discussion on weight management, recommendations for fruit and vegetable consumption, as well as protein, fluid, caffeine, fiber, sodium, sugar, and alcohol. Tips for eating when patients are short of breath are discussed.   PULMONARY REHAB OTHER RESPIRATORY from 10/20/2017 in [place]  Date  10/20/17  Educator  edna  Instruction Review Code (Retired)  2- meets goals/outcomes      Pursed Lip Breathing:  -Group instruction that is supported by demonstration and informational handouts.  Instructor discusses the benefits of pursed lip and diaphragmatic breathing and detailed demonstration on how to preform both.     Oxygen Safety:  -Group instruction provided by PowerPoint, verbal discussion, and written material to support subject matter. There is an overview of "What is Oxygen" and "Why do we need it".  Instructor also reviews how to create a safe environment for oxygen use, the importance of using oxygen as prescribed, and the risks of noncompliance. There is a brief discussion on traveling with oxygen and resources the patient may utilize.   PULMONARY REHAB OTHER RESPIRATORY from 10/20/2017 in [place]  Date  08/11/17  Educator  Truddie Crumble  Instruction Review Code (Retired)  2- meets goals/outcomes      Oxygen Equipment:  -Group instruction provided by [employer], Network engineer, and Insurance underwriter.   PULMONARY REHAB OTHER RESPIRATORY from 10/20/2017 in [place]  Date  09/15/17  Educator  [employer]  Instruction Review Code (Retired)  2- meets goals/outcomes      Signs and Symptoms:  -Group instruction provided by written material and verbal discussion to support subject matter. Warning signs and symptoms of infection, stroke, and heart attack are reviewed and when to call the physician/911 reinforced. Tips for preventing the spread of infection discussed.   Advanced Directives:  -Group instruction provided by verbal instruction and written material to support subject matter. Instructor reviews Advanced Directive laws and proper instruction for filling out document.   Pulmonary Video:  -Group video education that reviews the importance of  medication and oxygen compliance, exercise, good nutrition, pulmonary hygiene, and pursed lip and diaphragmatic breathing for the pulmonary patient.   PULMONARY REHAB OTHER RESPIRATORY from 10/20/2017 in [place]  Date  07/14/17  Instruction Review Code (Retired)  2- meets goals/outcomes      Exercise for the Pulmonary Patient:  -Group instruction that is supported by a PowerPoint presentation. Instructor discusses benefits of exercise, core components of exercise, frequency, duration, and intensity of an exercise routine, importance of utilizing pulse oximetry during exercise, safety while exercising, and options of places to exercise outside of rehab.     Pulmonary Medications:  -Verbally interactive group education provided by instructor with focus on inhaled medications and proper administration.   PULMONARY REHAB OTHER RESPIRATORY from 10/20/2017 in [place]  Date  08/18/17  Educator  Pharm D  Instruction Review Code (Retired)  2- Lawyer and Physiology of the Respiratory System and Intimacy:  -Group instruction provided by PowerPoint, verbal discussion, and written material to support subject matter. Instructor reviews respiratory cycle and anatomical components of the respiratory system and their functions. Instructor also reviews differences in obstructive and restrictive respiratory diseases with examples of each. Intimacy, Sex, and Sexuality differences are reviewed with a discussion on how relationships can change when diagnosed with pulmonary disease. Common sexual concerns are reviewed.   MD DAY -A group question and answer session with a medical doctor that allows participants to ask questions that relate to their pulmonary disease state.   PULMONARY REHAB OTHER RESPIRATORY from 10/20/2017 in [place]  Date  07/26/17  Educator  Dr. Nelda Marseille  Instruction Review Code (Retired)  2- meets goals/outcomes      OTHER EDUCATION -Group or individual verbal, written, or video instructions that support the educational goals of the pulmonary rehab program.   Holiday Eating  Survival Tips:  -Group instruction provided by PowerPoint slides, verbal discussion, and written materials to support subject matter. The instructor gives patients tips, tricks, and techniques to help them not only survive but enjoy the holidays despite the onslaught of food that accompanies the holidays.   Knowledge Questionnaire Score:   Core Components/Risk Factors/Patient Goals at Admission: Personal Goals and Risk Factors at Admission - 11/27/18 1233      Core Components/Risk Factors/Patient Goals on Admission   Improve shortness of breath with ADL's  Yes    Intervention  Provide education, individualized exercise plan and daily activity instruction to help decrease symptoms of SOB with activities of daily living.    Expected Outcomes  Short Term: Improve cardiorespiratory fitness to achieve a reduction of symptoms when performing ADLs;Long Term: Be able to perform more ADLs without symptoms or delay the onset of symptoms       Core Components/Risk Factors/Patient Goals Review:  Goals and Risk Factor Review    Row Name 11/27/18 1234             Core Components/Risk Factors/Patient Goals Review   Personal Goals Review  Develop more efficient breathing techniques such as purse lipped breathing and diaphragmatic breathing and practicing self-pacing with activity.;Improve shortness of breath with ADL's;Increase knowledge of respiratory medications and ability to use respiratory devices properly.          Core Components/Risk Factors/Patient Goals at Discharge (Final Review):  Goals and Risk Factor Review - 11/27/18 1234      Core Components/Risk Factors/Patient Goals Review   Personal  Goals Review  Develop more efficient breathing techniques such as purse lipped breathing and diaphragmatic breathing and practicing self-pacing with activity.;Improve shortness of breath with ADL's;Increase knowledge of respiratory medications and ability to use respiratory devices properly.        ITP Comments:   Comments:

## 2018-12-12 ENCOUNTER — Encounter (HOSPITAL_COMMUNITY)
Admission: RE | Admit: 2018-12-12 | Discharge: 2018-12-12 | Disposition: A | Discharge status: Home / Self Care | Payer: Medicare Other | Source: Ambulatory Visit | Attending: Emergency Medicine | Admitting: Emergency Medicine

## 2018-12-12 DIAGNOSIS — J432 Centrilobular emphysema: Secondary | ICD-10-CM | POA: Diagnosis not present

## 2018-12-12 DIAGNOSIS — I272 Pulmonary hypertension, unspecified: Secondary | ICD-10-CM

## 2018-12-12 DIAGNOSIS — J438 Other emphysema: Secondary | ICD-10-CM

## 2018-12-12 NOTE — Progress Notes (Signed)
Daily Session Note  Patient Details  Name: Kayla Mack MRN: [id number] Date of Birth: [birth year] Referring Provider:     Pulmonary Rehab Walk Test from 11/30/2018 in [place]  Referring Provider  Dr. Lamonte Sakai      Encounter Date: 12/12/2018  Check In: Session Check In - 12/12/18 1552      Check-In   Supervising physician immediately available to respond to emergencies  [facility] immediately available    Physician(s)  Dr. Herbert Moors    Location  [facility]    Staff Present  Rosebud Poles, RN, BSN;Molly DiVincenzo, MS, ACSM RCEP, Exercise Physiologist;Dalton Kris Mouton, MS, Exercise Physiologist;Other    Medication changes reported      No    Fall or balance concerns reported     No    Tobacco Cessation  No Change    Warm-up and Cool-down  Performed as group-led instruction    Resistance Training Performed  Yes    VAD Patient?  No    PAD/SET Patient?  No      Pain Assessment   Currently in Pain?  No/denies    Multiple Pain Sites  No       Capillary Blood Glucose: No results found for this or any previous visit (from the past 24 hour(s)).    Social History   Tobacco Use  Smoking Status Former Smoker  . Packs/day: 1.50  . Years: 40.00  . Pack years: 60.00  . Types: Cigarettes  . Last attempt to quit: 03/18/2017  . Years since quitting: 1.7  Smokeless Tobacco Never Used    Goals Met:  Proper associated with RPD/PD & O2 Sat Exercise tolerated well Strength training completed today  Goals Unmet:  Not Applicable  Comments: Service time is from 1330 to 1500    Dr. Rush Farmer is Medical Director for Pulmonary Rehab at [facility].

## 2018-12-13 ENCOUNTER — Telehealth: Payer: Self-pay | Admitting: Emergency Medicine

## 2018-12-13 NOTE — Telephone Encounter (Signed)
I haven't ordered this for her. I believe [employer] is seeking to interest her in it. We can discuss further at [facility] if she would like.

## 2018-12-13 NOTE — Telephone Encounter (Signed)
Call made to [facility] at [facility], she states the patient was contacted by respiratory therapist from [employer] regarding the Tidal Assit Ventilator and the patient sounded unsure as to why they were contacting her. She wanted to be sure this was something RB ordered. Upon review I do not see any documentation regarding this.   RB please advise if this is something you would like the patient to move ahead with. Thanks.

## 2018-12-13 NOTE — Telephone Encounter (Signed)
Left message for Kayla Mack at [facility] to call back.

## 2018-12-14 ENCOUNTER — Encounter (HOSPITAL_COMMUNITY)
Admission: RE | Admit: 2018-12-14 | Discharge: 2018-12-14 | Disposition: A | Discharge status: Home / Self Care | Payer: Medicare Other | Source: Ambulatory Visit | Attending: Emergency Medicine | Admitting: Emergency Medicine

## 2018-12-14 DIAGNOSIS — J432 Centrilobular emphysema: Secondary | ICD-10-CM | POA: Diagnosis not present

## 2018-12-14 DIAGNOSIS — I272 Pulmonary hypertension, unspecified: Secondary | ICD-10-CM

## 2018-12-14 DIAGNOSIS — J438 Other emphysema: Secondary | ICD-10-CM

## 2018-12-14 NOTE — Progress Notes (Signed)
Daily Session Note  Patient Details  Name: Kayla Mack MRN: [id number] Date of Birth: Nov 05, 1945 Referring Provider:     Pulmonary Rehab Walk Test from 11/30/2018 in [place]  Referring Provider  Dr. Lamonte Sakai      Encounter Date: 12/14/2018  Check In: Session Check In - 12/14/18 1344      Check-In   Supervising physician immediately available to respond to emergencies  [facility] immediately available    Physician(s)  Dr. Verlon Au    Location  [facility]    Staff Present  Rosebud Poles, RN, BSN;Molly DiVincenzo, MS, ACSM RCEP, Exercise Physiologist;Dalton Kris Mouton, MS, Exercise Physiologist;Lisa Ysidro Evert, RN    Medication changes reported      No    Fall or balance concerns reported     No    Tobacco Cessation  No Change    Warm-up and Cool-down  Performed as group-led instruction    Resistance Training Performed  Yes    VAD Patient?  No    PAD/SET Patient?  No      Pain Assessment   Currently in Pain?  No/denies    Multiple Pain Sites  No       Capillary Blood Glucose: No results found for this or any previous visit (from the past 24 hour(s)).    Social History   Tobacco Use  Smoking Status Former Smoker  . Packs/day: 1.50  . Years: 40.00  . Pack years: 60.00  . Types: Cigarettes  . Last attempt to quit: 03/18/2017  . Years since quitting: 1.7  Smokeless Tobacco Never Used    Goals Met:  Proper associated with RPD/PD & O2 Sat Exercise tolerated well Strength training completed today  Goals Unmet:  Not Applicable  Comments: Service time is from 1330 to 1510    Dr. Rush Farmer is Medical Director for Pulmonary Rehab at [facility].

## 2018-12-14 NOTE — Telephone Encounter (Signed)
Olivia Mackie, Hospice, is returning call. Cb is [phone].

## 2018-12-14 NOTE — Telephone Encounter (Signed)
Called Kayla Mack and advised message from the provider. She understood and verbalized understanding. Nothing further is needed

## 2018-12-14 NOTE — Telephone Encounter (Signed)
LMTCB x2 for Kayla Mack.

## 2018-12-19 ENCOUNTER — Encounter (HOSPITAL_COMMUNITY)
Admission: RE | Admit: 2018-12-19 | Discharge: 2018-12-19 | Disposition: A | Discharge status: Home / Self Care | Payer: Medicare Other | Source: Ambulatory Visit | Attending: Emergency Medicine | Admitting: Emergency Medicine

## 2018-12-19 VITALS — Wt 135.8 lb

## 2018-12-19 DIAGNOSIS — I272 Pulmonary hypertension, unspecified: Secondary | ICD-10-CM

## 2018-12-19 DIAGNOSIS — J432 Centrilobular emphysema: Secondary | ICD-10-CM | POA: Diagnosis not present

## 2018-12-19 DIAGNOSIS — J438 Other emphysema: Secondary | ICD-10-CM

## 2018-12-19 NOTE — Progress Notes (Signed)
Daily Session Note  Patient Details  Name: SHARANYA TEMPLIN MRN: [id number] Date of Birth: 1946/04/15 Referring Provider:     Pulmonary Rehab Walk Test from 11/30/2018 in [place]  Referring Provider  Dr. Lamonte Sakai      Encounter Date: 12/19/2018  Check In: Session Check In - 12/19/18 1354      Check-In   Supervising physician immediately available to respond to emergencies  [facility] immediately available    Physician(s)  Dr. Maylene Roes    Location  [facility]    Staff Present  Joycelyn Man RN, BSN;Joan Leonia Reeves, RN, BSN;Lisa Ysidro Evert, RN;Olinty [place], MS, ACSM CEP, Exercise Physiologist    Medication changes reported      No    Fall or balance concerns reported     No    Tobacco Cessation  No Change    Warm-up and Cool-down  Performed as group-led instruction    Resistance Training Performed  Yes    VAD Patient?  No    PAD/SET Patient?  No      Pain Assessment   Currently in Pain?  No/denies    Multiple Pain Sites  No       Capillary Blood Glucose: No results found for this or any previous visit (from the past 24 hour(s)).  Exercise Prescription Changes - 12/19/18 1500      Response to Exercise   Blood Pressure (Admit)  120/72    Blood Pressure (Exercise)  150/90    Blood Pressure (Exit)  110/64    Heart Rate (Admit)  72 bpm    Heart Rate (Exercise)  119 bpm    Heart Rate (Exit)  71 bpm    Oxygen Saturation (Admit)  95 %    Oxygen Saturation (Exercise)  88 %    Oxygen Saturation (Exit)  98 %    Rating of Perceived Exertion (Exercise)  15    Perceived Dyspnea (Exercise)  3    Duration  Progress to 45 minutes of aerobic exercise without signs/symptoms of physical distress    Intensity  Other (comment)   40-80% HRR     Resistance Training   Training Prescription  Yes    Weight  orange bands    Reps  10-15    Time  10 Minutes      Oxygen   Oxygen  Continuous    Liters  10      Recumbant Bike   Level  2     Watts  10    Minutes  17      NuStep   Level  3    SPM  80    Minutes  17    METs  2.5      Track   Laps  12    Minutes  17       Social History   Tobacco Use  Smoking Status Former Smoker  . Packs/day: 1.50  . Years: 40.00  . Pack years: 60.00  . Types: Cigarettes  . Last attempt to quit: 03/18/2017  . Years since quitting: 1.7  Smokeless Tobacco Never Used    Goals Met:  Proper associated with RPD/PD & O2 Sat Exercise tolerated well Strength training completed today  Goals Unmet:  Not Applicable  Comments: Service time is from 1330 to 1510    Dr. Rush Farmer is Medical Director for Pulmonary Rehab at [facility].

## 2018-12-21 ENCOUNTER — Encounter (HOSPITAL_COMMUNITY)
Admission: RE | Admit: 2018-12-21 | Discharge: 2018-12-21 | Disposition: A | Discharge status: Home / Self Care | Payer: Medicare Other | Source: Ambulatory Visit | Attending: Emergency Medicine | Admitting: Emergency Medicine

## 2018-12-21 DIAGNOSIS — J432 Centrilobular emphysema: Secondary | ICD-10-CM | POA: Diagnosis not present

## 2018-12-21 DIAGNOSIS — J438 Other emphysema: Secondary | ICD-10-CM

## 2018-12-21 NOTE — Progress Notes (Signed)
Pulmonary Individual Treatment Plan  Patient Details  Name: Kayla Mack MRN: [id number] Date of Birth: 1946/10/03 Referring Provider:     Pulmonary Rehab Walk Test from 11/30/2018 in [facility]  Referring Provider  Dr. Lamonte Sakai      Initial Encounter Date:    Pulmonary Rehab Walk Test from 11/30/2018 in [facility]  Date  11/30/18      Visit Diagnosis: Other emphysema ([place])  Patient's Home Medications on Admission:   Current Outpatient Medications:  .  acetaminophen (TYLENOL) 500 MG tablet, Take 1,000 mg by mouth 2 (two) times daily as needed for pain. , Disp: , Rfl:  .  albuterol (PROVENTIL HFA;VENTOLIN HFA) 108 (90 Base) MCG/ACT inhaler, Inhale 2 puffs into the lungs every 6 (six) hours as needed for wheezing or shortness of breath., Disp: , Rfl:  .  bisoprolol (ZEBETA) 10 MG tablet, Take 1 tablet (10 mg total) by mouth daily., Disp: 30 tablet, Rfl: 1 .  budesonide (PULMICORT) 0.25 MG/2ML nebulizer solution, Take 0.25 mg by nebulization 2 (two) times daily., Disp: , Rfl:  .  calcium-vitamin D (OSCAL WITH D) 500-200 MG-UNIT per tablet, Take 1 tablet by mouth 2 (two) times daily. (Patient taking differently: Take 1 tablet by mouth daily. ), Disp: , Rfl:  .  clonazePAM (KLONOPIN) 0.5 MG tablet, Take 1 tablet (0.5 mg total) by mouth 2 (two) times daily as needed for anxiety. (Patient taking differently: Take 0.5 mg by mouth 3 times/day as needed-between meals & bedtime for anxiety (sleep). ), Disp: 10 tablet, Rfl: 0 .  clopidogrel (PLAVIX) 75 MG tablet, Take 1 tablet (75 mg total) by mouth daily., Disp: 30 tablet, Rfl: 0 .  Diphenhyd-Hydrocort-Nystatin (FIRST-DUKES MOUTHWASH) SUSP, Gargle and swallow 1 tsp four times daily (Patient not taking: Reported on 11/27/2018), Disp: 240 mL, Rfl: 0 .  diphenhydrAMINE (BENADRYL) 25 mg capsule, Take 25 mg by mouth every 6 (six) hours as needed for allergies. , Disp: , Rfl:  .   fluticasone (FLONASE) 50 MCG/ACT nasal spray, Place 2 sprays into both nostrils daily., Disp: 16 g, Rfl: 0 .  guaiFENesin (MUCINEX) 600 MG 12 hr tablet, Take 1 tablet (600 mg total) by mouth 2 (two) times daily., Disp: 30 tablet, Rfl: 0 .  ipratropium-albuterol (DUONEB) 0.5-2.5 (3) MG/3ML SOLN, Take 3 mLs by nebulization daily. Dx: J44.9 (Patient not taking: Reported on 11/27/2018), Disp: 90 mL, Rfl: 5 .  loratadine (CLARITIN) 10 MG tablet, Take 1 tablet (10 mg total) by mouth daily. (Patient not taking: Reported on 11/27/2018), Disp: 30 tablet, Rfl: 0 .  lovastatin (MEVACOR) 40 MG tablet, Take 40 mg by mouth every morning., Disp: , Rfl:  .  PARoxetine (PAXIL) 20 MG tablet, Take 20 mg by mouth daily., Disp: , Rfl:  .  sodium chloride (OCEAN) 0.65 % SOLN nasal spray, Place 1 spray into both nostrils as needed for congestion., Disp: , Rfl:  .  traMADol (ULTRAM) 50 MG tablet, 1/2 tab - 1 tab every 8 hours as needed for back pain (Patient not taking: Reported on 11/27/2018), Disp: 15 tablet, Rfl: 0  Past Medical History: Past Medical History:  Diagnosis Date  . Anxiety   . COPD (chronic obstructive pulmonary disease) ([place])   . Depression   . Hyperlipidemia   . Hypertension     Tobacco Use: Social History   Tobacco Use  Smoking Status Former Smoker  . Packs/day: 1.50  . Years: 40.00  . Pack years: 60.00  .  Types: Cigarettes  . Last attempt to quit: 03/18/2017  . Years since quitting: 1.7  Smokeless Tobacco Never Used    Labs: Recent Review Scientist, physiological    Labs for ITP Cardiac and Pulmonary Rehab Latest Ref Rng & Units 05/09/2017 04/02/2018 07/26/2018 07/26/2018 08/15/2018   Cholestrol 0 - 200 mg/dL - - - - -   LDLCALC 0 - 99 mg/dL - - - - -   HDL >40 mg/dL - - - - -   Trlycerides <150 mg/dL - - - - -   Hemoglobin A1c 4.8 - 5.6 % - - - - -   PHART 7.350 - 7.450 - 7.202(L) - - -   PCO2ART 32.0 - 48.0 mmHg - 68.3(HH) - - -   HCO3 20.0 - 28.0 mmol/L 39.0(H) 27.0 - 21.4 29.0(H)   TCO2 22 -  32 mmol/L 41 _0 -   ACIDBASEDEF 0.0 - 2.0 mmol/L - 3.0(H) - 12.0(H) -   O2SAT % 65.0 98.0 - 99.0 97.9      Capillary Blood Glucose: Lab Results  Component Value Date   GLUCAP 82 07/30/2018   GLUCAP 117 (H) 07/29/2018   GLUCAP 117 (H) 07/29/2018   GLUCAP 102 (H) 07/29/2018   GLUCAP 122 (H) 07/29/2018     Pulmonary Assessment Scores: Pulmonary Assessment Scores    Row Name 11/30/18 1624         ADL UCSD   ADL Phase  Entry       mMRC Score   mMRC Score  4        Pulmonary Function Assessment: Pulmonary Function Assessment - 11/27/18 1224      Breath   Bilateral Breath Sounds  Clear    Shortness of Breath  Panic with Shortness of Breath;Limiting activity;Fear of Shortness of Breath;Yes       Exercise Target Goals: Exercise Program Goal: Individual exercise prescription set using results from initial 6 min walk test and THRR while considering  patient's activity barriers and safety.   Exercise Prescription Goal: Initial exercise prescription builds to 30-45 minutes a day of aerobic activity, 2-3 days per week.  Home exercise guidelines will be given to patient during program as part of exercise prescription that the participant will acknowledge.  Activity Barriers & Risk Stratification: Activity Barriers & Cardiac Risk Stratification - 11/27/18 1219      Activity Barriers & Cardiac Risk Stratification   Activity Barriers  None       6 Minute Walk: 6 Minute Walk    Row Name 11/30/18 1617         6 Minute Walk   Phase  Initial     Distance  1310 feet     Walk Time  6 minutes     # of Rest Breaks  0     MPH  2.48     METS  2.91     RPE  13     Perceived Dyspnea   3     Symptoms  No     Resting HR  73 bpm     Resting BP  128/64     Resting Oxygen Saturation   92 %     Exercise Oxygen Saturation  during 6 min walk  82 %     Max Ex. HR  105 bpm     Max Ex. BP  150/72       Interval HR   1 Minute HR  85     2 Minute  HR  94     3 Minute HR   101     4 Minute HR  104     5 Minute HR  103     6 Minute HR  105     2 Minute Post HR  87     Interval Heart Rate?  Yes       Interval Oxygen   Interval Oxygen?  Yes     Baseline Oxygen Saturation %  92 %     1 Minute Oxygen Saturation %  94 %     1 Minute Liters of Oxygen  10 L     2 Minute Oxygen Saturation %  91 %     2 Minute Liters of Oxygen  10 L     3 Minute Oxygen Saturation %  90 %     3 Minute Liters of Oxygen  10 L     4 Minute Oxygen Saturation %  89 %     4 Minute Liters of Oxygen  10 L     5 Minute Oxygen Saturation %  86 %     5 Minute Liters of Oxygen  10 L     6 Minute Oxygen Saturation %  82 %     6 Minute Liters of Oxygen  10 L     2 Minute Post Oxygen Saturation %  86 %     2 Minute Post Liters of Oxygen  10 L        Oxygen Initial Assessment: Oxygen Initial Assessment - 11/30/18 1623      Initial 6 min Walk   Oxygen Used  Continuous;E-Tanks    Liters per minute  10      Program Oxygen Prescription   Program Oxygen Prescription  Continuous;E-Tanks    Liters per minute  10       Oxygen Re-Evaluation: Oxygen Re-Evaluation    Row Name 12/19/18 0746             Program Oxygen Prescription   Program Oxygen Prescription  Continuous;E-Tanks       Liters per minute  10       Comments  patient now requires 10 liters for ambulation on the track         Home Oxygen   Home Oxygen Device  Home Concentrator;E-Tanks       Sleep Oxygen Prescription  Continuous;BiPAP       Liters per minute  6       Home Exercise Oxygen Prescription  Continuous       Liters per minute  10       Home at Rest Exercise Oxygen Prescription  Continuous       Liters per minute  6       Compliance with Home Oxygen Use  Yes         Goals/Expected Outcomes   Short Term Goals  To learn and exhibit compliance with exercise, home and travel O2 prescription;To learn and understand importance of monitoring SPO2 with pulse oximeter and demonstrate accurate use of the pulse  oximeter.;To learn and understand importance of maintaining oxygen saturations>88%;To learn and demonstrate proper pursed lip breathing techniques or other breathing techniques.;To learn and demonstrate proper use of respiratory medications       Long  Term Goals  Exhibits compliance with exercise, home and travel O2 prescription;Verbalizes importance of monitoring SPO2 with pulse oximeter and return demonstration;Maintenance of O2 saturations>88%;Exhibits proper breathing techniques, such as pursed lip  breathing or other method taught during program session;Compliance with respiratory medication;Demonstrates proper use of MDI's       Comments  patient now has multiple large e tanks with carrier, a larger high liter flow concentrator with autofill capabilities.       Goals/Expected Outcomes  patient verbalizes complaince with home O2          Oxygen Discharge (Final Oxygen Re-Evaluation): Oxygen Re-Evaluation - 12/19/18 0746      Program Oxygen Prescription   Program Oxygen Prescription  Continuous;E-Tanks    Liters per minute  10    Comments  patient now requires 10 liters for ambulation on the track      [place] Concentrator;E-Tanks    Sleep Oxygen Prescription  Continuous;BiPAP    Liters per minute  6    Home Exercise Oxygen Prescription  Continuous    Liters per minute  10    Home at Rest Exercise Oxygen Prescription  Continuous    Liters per minute  6    Compliance with Home Oxygen Use  Yes      Goals/Expected Outcomes   Short Term Goals  To learn and exhibit compliance with exercise, home and travel O2 prescription;To learn and understand importance of monitoring SPO2 with pulse oximeter and demonstrate accurate use of the pulse oximeter.;To learn and understand importance of maintaining oxygen saturations>88%;To learn and demonstrate proper pursed lip breathing techniques or other breathing techniques.;To learn and demonstrate proper use of  respiratory medications    Long  Term Goals  Exhibits compliance with exercise, home and travel O2 prescription;Verbalizes importance of monitoring SPO2 with pulse oximeter and return demonstration;Maintenance of O2 saturations>88%;Exhibits proper breathing techniques, such as pursed lip breathing or other method taught during program session;Compliance with respiratory medication;Demonstrates proper use of MDI's    Comments  patient now has multiple large e tanks with carrier, a larger high liter flow concentrator with autofill capabilities.    Goals/Expected Outcomes  patient verbalizes complaince with home O2       Initial Exercise Prescription: Initial Exercise Prescription - 11/30/18 1600      Date of Initial Exercise RX and Referring Provider   Date  11/30/18    Referring Provider  Dr. Lamonte Sakai      Oxygen   Oxygen  Continuous    Liters  10      Recumbant Bike   Level  2    Watts  10    Minutes  17      NuStep   Level  2    SPM  80    Minutes  17    METs  1.5      Track   Laps  10    Minutes  17      Prescription Details   Frequency (times per week)  2    Duration  Progress to 45 minutes of aerobic exercise without signs/symptoms of physical distress      Intensity   THRR 40-80% of Max Heartrate  58-117    Ratings of Perceived Exertion  11-13    Perceived Dyspnea  0-4      Progression   Progression  Continue progressive overload as per policy without signs/symptoms or physical distress.      Resistance Training   Training Prescription  Yes    Weight  orange bands    Reps  10-15       Perform Capillary Blood Glucose checks as needed.  Exercise Prescription Changes: Exercise Prescription Changes    Row Name 12/19/18 1500             Response to Exercise   Blood Pressure (Admit)  120/72       Blood Pressure (Exercise)  150/90       Blood Pressure (Exit)  110/64       Heart Rate (Admit)  72 bpm       Heart Rate (Exercise)  119 bpm       Heart Rate  (Exit)  71 bpm       Oxygen Saturation (Admit)  95 %       Oxygen Saturation (Exercise)  88 %       Oxygen Saturation (Exit)  98 %       Rating of Perceived Exertion (Exercise)  15       Perceived Dyspnea (Exercise)  3       Duration  Progress to 45 minutes of aerobic exercise without signs/symptoms of physical distress       Intensity  Other (comment) 40-80% HRR         Resistance Training   Training Prescription  Yes       Weight  orange bands       Reps  10-15       Time  10 Minutes         Oxygen   Oxygen  Continuous       Liters  10         Recumbant Bike   Level  2       Watts  10       Minutes  17         NuStep   Level  3       SPM  80       Minutes  17       METs  2.5         Track   Laps  12       Minutes  17          Exercise Comments:   Exercise Goals and Review:   Exercise Goals Re-Evaluation : Exercise Goals Re-Evaluation    Row Name 12/19/18 0746             Exercise Goal Re-Evaluation   Exercise Goals Review  Increase Physical Activity;Increase Strength and Stamina;Able to understand and use rate of perceived exertion (RPE) scale;Able to understand and use Dyspnea scale;Knowledge and understanding of Target Heart Rate Range (THRR);Understanding of Exercise Prescription       Comments  Patient has only attended 2 rehab sessions. Will cont to monitor and progress as able.        Expected Outcomes  Through exercise at rehab and at home, patient will increase strength and stamina making ADL's easier to perform. Patient will also have a better understanding of safe exercise and what they are capable to do outside of clinical supervision.          Discharge Exercise Prescription (Final Exercise Prescription Changes): Exercise Prescription Changes - 12/19/18 1500      Response to Exercise   Blood Pressure (Admit)  120/72    Blood Pressure (Exercise)  150/90    Blood Pressure (Exit)  110/64    Heart Rate (Admit)  72 bpm    Heart Rate (Exercise)   119 bpm    Heart Rate (Exit)  71 bpm    Oxygen Saturation (Admit)  95 %  Oxygen Saturation (Exercise)  88 %    Oxygen Saturation (Exit)  98 %    Rating of Perceived Exertion (Exercise)  15    Perceived Dyspnea (Exercise)  3    Duration  Progress to 45 minutes of aerobic exercise without signs/symptoms of physical distress    Intensity  Other (comment)   40-80% HRR     Resistance Training   Training Prescription  Yes    Weight  orange bands    Reps  10-15    Time  10 Minutes      Oxygen   Oxygen  Continuous    Liters  10      Recumbant Bike   Level  2    Watts  10    Minutes  17      NuStep   Level  3    SPM  80    Minutes  17    METs  2.5      Track   Laps  12    Minutes  17       Nutrition:  Target Goals: Understanding of nutrition guidelines, daily intake of sodium <1549m, cholesterol <2040m calories 30% from fat and 7% or less from saturated fats, daily to have 5 or more servings of fruits and vegetables.  Biometrics: Pre Biometrics - 11/27/18 1220      Pre Biometrics   Grip Strength  22 kg        Nutrition Therapy Plan and Nutrition Goals:   Nutrition Assessments:   Nutrition Goals Re-Evaluation:   Nutrition Goals Discharge (Final Nutrition Goals Re-Evaluation):   Psychosocial: Target Goals: Acknowledge presence or absence of significant depression and/or stress, maximize coping skills, provide positive support system. Participant is able to verbalize types and ability to use techniques and skills needed for reducing stress and depression.  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   sees a coRetail bankereekly     Screening Interventions   Interventions  Encouraged to exercise    Comments  currently sees a counsellor weekly and is active in church    Expected Outcomes  Long Term Goal: Stressors or current issues are controlled or eliminated.;Long Term goal: The participant improves quality of Life and PHQ9 Scores as seen by post scores and/or verbalization of changes       Quality of Life Scores:  Scores of 19 and below usually indicate a poorer quality of life in these areas.  A difference of  2-3 points is a clinically meaningful difference.  A difference of 2-3 points in the total score of the Quality of Life Index has been associated with significant improvement in overall quality of life, self-image, physical symptoms, and general health in  studies assessing change in quality of life.  PHQ-9: Recent Review Flowsheet Data    Depression screen [facility] 2/9 11/27/2018 08/25/2018 10/25/2017 08/29/2017 07/27/2017   Decreased Interest 0 0 _0 Down, Depressed, Hopeless _1 PHQ - 2 Score _2 Altered sleeping 0 - - 0 0   Tired, decreased energy 2 - - 1 1   Change in appetite 0 - - 0 1   Feeling bad or failure about yourself  1 - - 0 0   Trouble concentrating 0 - - 0 1   Moving slowly or fidgety/restless 0 - - 0 0   Suicidal thoughts 0 - - 0 0   PHQ-9 Score 5 - - 3 5   Difficult doing work/chores Somewhat difficult - - Somewhat difficult -     Interpretation of Total Score  Total Score Depression Severity:  1-4 = Minimal depression, 5-9 = Mild depression, 10-14 =  Moderate depression, 15-19 = Moderately severe depression, 20-27 = Severe depression   Psychosocial Evaluation and Intervention: Psychosocial Evaluation - 11/27/18 1230      Psychosocial Evaluation & Interventions   Interventions  Stress management education;Relaxation education;Encouraged to exercise with the program and follow exercise prescription    Comments  is dealing with her stressors well with the counsellor and medications    Expected Outcomes  psychosocial issues will not become a barrier to participation to pulmonary rehab    Continue Psychosocial Services   Follow up required by counselor       Psychosocial Re-Evaluation: Psychosocial Re-Evaluation    [place] Name 12/18/18 1339             Psychosocial Re-Evaluation   Current issues with  Current Stress Concerns       Comments  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  She sees a Retail banker weekly and takes anti-depressants.       Expected Outcomes  patient will remain free from psychosocial barriers       Interventions  Encouraged to attend Pulmonary Rehabilitation for the exercise;Relaxation education;Stress management education       Continue Psychosocial Services   Follow up required by counselor       Comments  She is happy to be back in pulmonary rehab, she has participated in the past and has many friends from the past program.         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  She sees a Retail banker weekly and takes anti-depressants.    Expected Outcomes  patient will remain free from psychosocial  barriers    Interventions  Encouraged to attend Pulmonary Rehabilitation for the exercise;Relaxation education;Stress management education    Continue Psychosocial Services   Follow up required by counselor    Comments  She is happy to be back in pulmonary rehab, she has participated in the past and has many friends from the past program.      Initial Review   Source of Stress Concerns  Chronic Illness;Family;Unable to  participate in former interests or hobbies       Education: Education Goals: Education classes will be provided on a weekly basis, covering required topics. Participant will state understanding/return demonstration of topics presented.  Learning Barriers/Preferences: Learning Barriers/Preferences - 11/27/18 1232      Learning Barriers/Preferences   Learning Barriers  None    Learning Preferences  Audio;Group Instruction;Computer/Internet;Individual Instruction;Skilled Demonstration;Pictoral;Verbal Instruction;Video;Written Material       Education Topics: Risk Factor Reduction:  -Group instruction that is supported by a PowerPoint presentation. Instructor discusses the definition of a risk factor, different risk factors for pulmonary disease, and how the heart and lungs work together.     Nutrition for Pulmonary Patient:  -Group instruction provided by PowerPoint slides, verbal discussion, and written materials to support subject matter. The instructor gives an explanation and review of healthy diet recommendations, which includes a discussion on weight management, recommendations for fruit and vegetable consumption, as well as protein, fluid, caffeine, fiber, sodium, sugar, and alcohol. Tips for eating when patients are short of breath are discussed.   PULMONARY REHAB OTHER RESPIRATORY from 10/20/2017 in [place]  Date  10/20/17  Educator  edna  Instruction Review Code (Retired)  2- meets goals/outcomes      Pursed Lip Breathing:   -Group instruction that is supported by demonstration and informational handouts. Instructor discusses the benefits of pursed lip and diaphragmatic breathing and detailed demonstration on how to preform both.     Oxygen Safety:  -Group instruction provided by PowerPoint, verbal discussion, and written material to support subject matter. There is an overview of "What is Oxygen" and "Why do we need it".  Instructor also reviews how to create a safe environment for oxygen use, the importance of using oxygen as prescribed, and the risks of noncompliance. There is a brief discussion on traveling with oxygen and resources the patient may utilize.   PULMONARY REHAB OTHER RESPIRATORY from 10/20/2017 in [place]  Date  08/11/17  Educator  Truddie Crumble  Instruction Review Code (Retired)  2- meets goals/outcomes      Oxygen Equipment:  -Group instruction provided by [employer], Network engineer, and Insurance underwriter.   PULMONARY REHAB OTHER RESPIRATORY from 10/20/2017 in [place]  Date  09/15/17  Educator  [employer]  Instruction Review Code (Retired)  2- meets goals/outcomes      Signs and Symptoms:  -Group instruction provided by written material and verbal discussion to support subject matter. Warning signs and symptoms of infection, stroke, and heart attack are reviewed and when to call the physician/911 reinforced. Tips for preventing the spread of infection discussed.   PULMONARY REHAB OTHER RESPIRATORY from 12/14/2018 in [place]  Date  12/14/18  Educator  Remo Lipps  Instruction Review Code  1- Verbalizes Understanding      Advanced Directives:  -Group instruction provided by verbal instruction and written material to support subject matter. Instructor reviews Advanced Directive laws and proper instruction for filling out document.   Pulmonary Video:  -Group  video education that reviews the importance of medication and oxygen compliance, exercise, good nutrition, pulmonary hygiene, and pursed lip and diaphragmatic breathing for the pulmonary patient.   PULMONARY REHAB OTHER RESPIRATORY from 10/20/2017 in [place]  Date  07/14/17  Instruction Review Code (Retired)  2- meets goals/outcomes      Exercise for the Pulmonary Patient:  -Group instruction that  is supported by a PowerPoint presentation. Instructor discusses benefits of exercise, core components of exercise, frequency, duration, and intensity of an exercise routine, importance of utilizing pulse oximetry during exercise, safety while exercising, and options of places to exercise outside of rehab.     Pulmonary Medications:  -Verbally interactive group education provided by instructor with focus on inhaled medications and proper administration.   PULMONARY REHAB OTHER RESPIRATORY from 10/20/2017 in [place]  Date  08/18/17  Educator  Pharm D  Instruction Review Code (Retired)  2- Lawyer and Physiology of the Respiratory System and Intimacy:  -Group instruction provided by PowerPoint, verbal discussion, and written material to support subject matter. Instructor reviews respiratory cycle and anatomical components of the respiratory system and their functions. Instructor also reviews differences in obstructive and restrictive respiratory diseases with examples of each. Intimacy, Sex, and Sexuality differences are reviewed with a discussion on how relationships can change when diagnosed with pulmonary disease. Common sexual concerns are reviewed.   MD DAY -A group question and answer session with a medical doctor that allows participants to ask questions that relate to their pulmonary disease state.   PULMONARY REHAB OTHER RESPIRATORY from 10/20/2017 in [place]  Date   07/26/17  Educator  Dr. Nelda Marseille  Instruction Review Code (Retired)  2- meets goals/outcomes      OTHER EDUCATION -Group or individual verbal, written, or video instructions that support the educational goals of the pulmonary rehab program.   Holiday Eating Survival Tips:  -Group instruction provided by PowerPoint slides, verbal discussion, and written materials to support subject matter. The instructor gives patients tips, tricks, and techniques to help them not only survive but enjoy the holidays despite the onslaught of food that accompanies the holidays.   Knowledge Questionnaire Score:   Core Components/Risk Factors/Patient Goals at Admission: Personal Goals and Risk Factors at Admission - 11/27/18 1233      Core Components/Risk Factors/Patient Goals on Admission   Improve shortness of breath with ADL's  Yes    Intervention  Provide education, individualized exercise plan and daily activity instruction to help decrease symptoms of SOB with activities of daily living.    Expected Outcomes  Short Term: Improve cardiorespiratory fitness to achieve a reduction of symptoms when performing ADLs;Long Term: Be able to perform more ADLs without symptoms or delay the onset of symptoms       Core Components/Risk Factors/Patient Goals Review:  Goals and Risk Factor Review    Row Name 11/27/18 1234 12/18/18 1352           Core Components/Risk Factors/Patient Goals Review   Personal Goals Review  Develop more efficient breathing techniques such as purse lipped breathing and diaphragmatic breathing and practicing self-pacing with activity.;Improve shortness of breath with ADL's;Increase knowledge of respiratory medications and ability to use respiratory devices properly.  Improve shortness of breath with ADL's;Increase knowledge of respiratory medications and ability to use respiratory devices properly.;Develop more efficient breathing techniques such as purse lipped breathing and  diaphragmatic breathing and practicing self-pacing with activity.      Review  -  Just started program, has attended 2 exercise sessions, too early to have met program goals.      Expected Outcomes  -  see admission outcomes         Core Components/Risk Factors/Patient Goals at Discharge (Final Review):  Goals and Risk Factor Review - 12/18/18 1352  Core Components/Risk Factors/Patient Goals Review   Personal Goals Review  Improve shortness of breath with ADL's;Increase knowledge of respiratory medications and ability to use respiratory devices properly.;Develop more efficient breathing techniques such as purse lipped breathing and diaphragmatic breathing and practicing self-pacing with activity.    Review  Just started program, has attended 2 exercise sessions, too early to have met program goals.    Expected Outcomes  see admission outcomes       ITP Comments:   Comments: ITP REVIEW Pt is making expected progress toward pulmonary rehab goals after completing 3 sessions. Recommend continued exercise, life style modification, education, and utilization of breathing techniques to increase stamina and strength and decrease shortness of breath with exertion.

## 2018-12-21 NOTE — Progress Notes (Signed)
Daily Session Note  Patient Details  Name: Kayla Mack MRN: [id number] Date of Birth: 09/26/1946 Referring Provider:     Pulmonary Rehab Walk Test from 11/30/2018 in [facility]  Referring Provider  Dr. Byrum      Encounter Date: 12/21/2018  Check In: Session Check In - 12/21/18 1330      Check-In   Supervising physician immediately available to respond to emergencies  [facility] immediately available    Physician(s)  Dr. Rizwan    Location  [facility]    Staff Present  Lindsay Agnew RN, BSN;Molly DiVincenzo, MS, ACSM RCEP, Exercise Physiologist;Dalton Fletcher, MS, Exercise Physiologist;Masson Nalepa, RN, BSN;Lisa Hughes, RN    Medication changes reported      No    Fall or balance concerns reported     No    Tobacco Cessation  No Change    Warm-up and Cool-down  Performed as group-led instruction    Resistance Training Performed  Yes    VAD Patient?  No    PAD/SET Patient?  No      Pain Assessment   Currently in Pain?  No/denies    Multiple Pain Sites  No       Capillary Blood Glucose: No results found for this or any previous visit (from the past 24 hour(s)).    Social History   Tobacco Use  Smoking Status Former Smoker  . Packs/day: 1.50  . Years: 40.00  . Pack years: 60.00  . Types: Cigarettes  . Last attempt to quit: 03/18/2017  . Years since quitting: 1.7  Smokeless Tobacco Never Used    Goals Met:  Proper associated with RPD/PD & O2 Sat Exercise tolerated well Strength training completed today  Goals Unmet:  Not Applicable  Comments: Service time is from 1330 to 1515.    Dr. Wesam G. Yacoub is Medical Director for Pulmonary Rehab at [facility]. 

## 2018-12-21 NOTE — Progress Notes (Deleted)
Daily Session Note  Patient Details  Name: Kayla Mack MRN: [id number] Date of Birth: September 11, 1946 Referring Provider:     Pulmonary Rehab Walk Test from 11/30/2018 in [place]  Referring Provider  Dr. Lamonte Sakai      Encounter Date: 12/21/2018  Check In: Session Check In - 12/21/18 1330      Check-In   Supervising physician immediately available to respond to emergencies  [facility] immediately available    Physician(s)  Dr. Wynelle Cleveland    Location  [facility]    Staff Present  Joycelyn Man RN, BSN;Molly DiVincenzo, MS, ACSM RCEP, Exercise Physiologist;Dalton Kris Mouton, MS, Exercise Physiologist;Nirvan Laban Leonia Reeves, RN, Roque Cash, RN    Medication changes reported      No    Fall or balance concerns reported     No    Tobacco Cessation  No Change    Warm-up and Cool-down  Performed as group-led instruction    Resistance Training Performed  Yes    VAD Patient?  No    PAD/SET Patient?  No      Pain Assessment   Currently in Pain?  No/denies    Multiple Pain Sites  No       Capillary Blood Glucose: No results found for this or any previous visit (from the past 24 hour(s)).    Social History   Tobacco Use  Smoking Status Former Smoker  . Packs/day: 1.50  . Years: 40.00  . Pack years: 60.00  . Types: Cigarettes  . Last attempt to quit: 03/18/2017  . Years since quitting: 1.7  Smokeless Tobacco Never Used    Goals Met:  Proper associated with RPD/PD & O2 Sat Exercise tolerated well Strength training completed today  Goals Unmet:  Not Applicable  Comments: Service time is from 1330 to 1515.    Dr. Rush Farmer is Medical Director for Pulmonary Rehab at [facility].

## 2018-12-26 ENCOUNTER — Encounter (HOSPITAL_COMMUNITY)
Admission: RE | Admit: 2018-12-26 | Discharge: 2018-12-26 | Disposition: A | Discharge status: Home / Self Care | Payer: Medicare Other | Source: Ambulatory Visit | Attending: Emergency Medicine | Admitting: Emergency Medicine

## 2018-12-26 ENCOUNTER — Encounter (HOSPITAL_COMMUNITY): Payer: Medicare Other

## 2018-12-26 VITALS — Wt 135.6 lb

## 2018-12-26 DIAGNOSIS — I272 Pulmonary hypertension, unspecified: Secondary | ICD-10-CM

## 2018-12-26 DIAGNOSIS — J438 Other emphysema: Secondary | ICD-10-CM

## 2018-12-26 DIAGNOSIS — J432 Centrilobular emphysema: Secondary | ICD-10-CM | POA: Diagnosis not present

## 2018-12-26 NOTE — Progress Notes (Signed)
Daily Session Note  Patient Details  Name: Kayla Mack MRN: [id number] Date of Birth: 12/23/1945 Referring Provider:     Pulmonary Rehab Walk Test from 11/30/2018 in [place]  Referring Provider  Dr. Lamonte Sakai      Encounter Date: 12/26/2018  Check In: Session Check In - 12/26/18 1121      Check-In   Supervising physician immediately available to respond to emergencies  [facility] immediately available    Physician(s)  Dr. Jonnie Finner    Location  [facility]    Staff Present  Su Hilt, MS, ACSM RCEP, Exercise Physiologist;Dalton Kris Mouton, MS, Exercise Physiologist;Breccan Galant Leonia Reeves, RN, Roque Cash, RN;Other    Medication changes reported      No    Fall or balance concerns reported     No    Tobacco Cessation  No Change    Warm-up and Cool-down  Performed as group-led instruction    Resistance Training Performed  Yes    VAD Patient?  No    PAD/SET Patient?  No      Pain Assessment   Currently in Pain?  No/denies    Multiple Pain Sites  No       Capillary Blood Glucose: No results found for this or any previous visit (from the past 24 hour(s)).    Social History   Tobacco Use  Smoking Status Former Smoker  . Packs/day: 1.50  . Years: 40.00  . Pack years: 60.00  . Types: Cigarettes  . Last attempt to quit: 03/18/2017  . Years since quitting: 1.7  Smokeless Tobacco Never Used    Goals Met:  Proper associated with RPD/PD & O2 Sat Exercise tolerated well Strength training completed today  Goals Unmet:  Not Applicable  Comments: Service time is from 1030 to 1215    Dr. Rush Farmer is Medical Director for Pulmonary Rehab at [facility].

## 2018-12-28 ENCOUNTER — Encounter (HOSPITAL_COMMUNITY): Payer: Medicare Other

## 2018-12-29 ENCOUNTER — Ambulatory Visit (INDEPENDENT_AMBULATORY_CARE_PROVIDER_SITE_OTHER): Payer: Medicare Other | Admitting: Primary Care

## 2018-12-29 ENCOUNTER — Encounter: Payer: Self-pay | Admitting: Primary Care

## 2018-12-29 ENCOUNTER — Other Ambulatory Visit: Payer: Self-pay

## 2018-12-29 DIAGNOSIS — J9611 Chronic respiratory failure with hypoxia: Secondary | ICD-10-CM

## 2018-12-29 NOTE — Progress Notes (Signed)
_0  ID: Kayla Mack, female    DOB: [birth year], 73 y.o.   MRN: [id number]  Chief Complaint  Patient presents with   Follow-up    breathing good,     Referring provider: Jani Gravel, MD  HPI: 73 year old female, former smoker. PMH severe COPD, chronic respiratory failure with hypoxia, pulmonary hypertension, anxiety, tachycardia, osteopenia, GERD. Patient of Dr. Lamonte Sakai, last seen 10/30/18.  Previous [place] Encounter: 09/25/2018- Acute visit, back pain  Patient presents today with acute complaints of right posterior back pain x 4 days. Accompanied by family friend. States that it hurts to take a deep breath and lie down flat on her back at night. Some mild cough but this is not abnormal for her. Cough is productive with no color. Breathing is the same. No additional oxygen requirement. Denies trauma or fall. Additional reports of elevated blood pressure readings. States that she checked with automatic cuff. She reports that her blood pressure was 183/95 today before am medication. SBP has been running between 170-180's at home. Thinks a lot of it is anxiety and panic attack. Takes Bisoprolol 41m daily and clonazepam 0.576mBID as needed (usually once a day). Feels her blood pressure was better controlled on Atenolol in the past, states that it was changed during a hospital admission over a year ago. She does not have a PCP, states that her primary care doctor is out on medical leave. [facility] are wanting her to have additional studies done including heart cath, CT and MRI next week. She is unsure if she needs them, would like to know if Dr. ByLamonte Sakaihinks it would be worth it. States that if it's not going to prolong or add quality to her life then she does not want to do it. Per Dr. ByLamonte Sakaihe is not a good candidate for medical therapy given underlying cause of PAH. She is fearful of living alone, has questions if she should sell her house and move  to Assisted living.   ROV 10/30/18 -- FU  This a follow-up visit for [age]ear old woman with very severe COPD, combined hypoxemic hypercapnic respiratory failure for which she has been treated with a trilogy mechanical ventilator that she uses each evening.  She also has secondary pulmonary hypertension. She reports that her breathing is stable since last visit but she remains confused about her BD. She is not taking DuoNeb on a schedule, uses prn. She has budesonide / brovana that she uses reliably BID. Her O2 is at 6 - 10L/min/. She has [facility]: A 6-minute walk 11/15 showed a walk distance of 1296 feet on supplemental oxygen. Pulmonary function testing confirmed an FEV1 of 0.72 L, FVC 1.84 L, ratio 39%.  Total lung capacity 133% predicted, residual volume 207% predicted. V/Q scan 08/31/2018 was low probability for pulmonary embolism. Looks like she is being considered for PERFECT trial (Tyvaso).   12/29/2018 Patient presents today for 2 month follow-up. States that she is doing well. She was switched from nebulized Budesonide/brovana to Trelegy and feels its been working well. States that it is a lot less time consuming. Continues on 6L oxygen at rest and 10L on exertion. Staying active, attends rehab and uses the bike/rower/stair machine. She has a life alert but hasn't worn it recently. Asking about SIDEKICK tidal assist ventilator from [employer]. Followed by palliative care nurse.   Testing: Spirometry 06/06/17 > severe obstruction Pulmonary function testing performed today  10/4 were reviewed by me. These showSevere obstruction with an FEV1 of 0.92 L (48% predicted), no bronchodilator response, hyperinflated volumes, severely decreased diffusion capacity. R heart cath 05/09/17: PAP 57/22 (38), PAOP 12  Allergies  Allergen Reactions   Aleve [Naproxen Sodium] Hives   Aspirin Swelling    Facial swelling   Penicillins Hives and Swelling    Has patient had a PCN  reaction causing immediate rash, facial/tongue/throat swelling, SOB or lightheadedness with hypotension: Yes Has patient had a PCN reaction causing severe rash involving mucus membranes or skin necrosis: Yes Has patient had a PCN reaction that required hospitalization: Unk Has patient had a PCN reaction occurring within the last 10 years: No If all of the above answers are "NO", then may proceed with Cephalosporin use.     Immunization History  Administered Date(s) Administered   Influenza, High Dose Seasonal PF 07/18/2016, 07/13/2017, 07/17/2018   Influenza-Unspecified 07/18/2013   Pneumococcal Conjugate-13 07/13/2017   Pneumococcal Polysaccharide-23 07/18/2016    Past Medical History:  Diagnosis Date   Anxiety    COPD (chronic obstructive pulmonary disease) ([place])    Depression    Hyperlipidemia    Hypertension     Tobacco History: Social History   Tobacco Use  Smoking Status Former Smoker   Packs/day: 1.50   Years: 40.00   Pack years: 60.00   Types: Cigarettes   Last attempt to quit: 03/18/2017   Years since quitting: 1.7  Smokeless Tobacco Never Used   Counseling given: Not Answered   Outpatient Medications Prior to Visit  Medication Sig Dispense Refill   acetaminophen (TYLENOL) 500 MG tablet Take 1,000 mg by mouth 2 (two) times daily as needed for pain.      albuterol (PROVENTIL HFA;VENTOLIN HFA) 108 (90 Base) MCG/ACT inhaler Inhale 2 puffs into the lungs every 6 (six) hours as needed for wheezing or shortness of breath.     bisoprolol (ZEBETA) 10 MG tablet Take 1 tablet (10 mg total) by mouth daily. 30 tablet 1   calcium-vitamin D (OSCAL WITH D) 500-200 MG-UNIT per tablet Take 1 tablet by mouth 2 (two) times daily. (Patient taking differently: Take 1 tablet by mouth daily. )     clonazePAM (KLONOPIN) 0.5 MG tablet Take 1 tablet (0.5 mg total) by mouth 2 (two) times daily as needed for anxiety. (Patient taking differently: Take 0.5 mg by mouth 3  times/day as needed-between meals & bedtime for anxiety (sleep). ) 10 tablet 0   clopidogrel (PLAVIX) 75 MG tablet Take 1 tablet (75 mg total) by mouth daily. 30 tablet 0   Diphenhyd-Hydrocort-Nystatin (FIRST-DUKES MOUTHWASH) SUSP Gargle and swallow 1 tsp four times daily 240 mL 0   diphenhydrAMINE (BENADRYL) 25 mg capsule Take 25 mg by mouth every 6 (six) hours as needed for allergies.      fluticasone (FLONASE) 50 MCG/ACT nasal spray Place 2 sprays into both nostrils daily. 16 g 0   guaiFENesin (MUCINEX) 600 MG 12 hr tablet Take 1 tablet (600 mg total) by mouth 2 (two) times daily. 30 tablet 0   loratadine (CLARITIN) 10 MG tablet Take 1 tablet (10 mg total) by mouth daily. 30 tablet 0   lovastatin (MEVACOR) 40 MG tablet Take 40 mg by mouth every morning.     PARoxetine (PAXIL) 20 MG tablet Take 20 mg by mouth daily.     sodium chloride (OCEAN) 0.65 % SOLN nasal spray Place 1 spray into both nostrils as needed for congestion.     traMADol (ULTRAM) 50  MG tablet 1/2 tab - 1 tab every 8 hours as needed for back pain 15 tablet 0   budesonide (PULMICORT) 0.25 MG/2ML nebulizer solution Take 0.25 mg by nebulization 2 (two) times daily.     ipratropium-albuterol (DUONEB) 0.5-2.5 (3) MG/3ML SOLN Take 3 mLs by nebulization daily. Dx: J44.9 (Patient not taking: Reported on 12/29/2018) 90 mL 5   No facility-administered medications prior to visit.     Review of Systems  Review of Systems  Constitutional: Negative.   Respiratory: Negative for cough, shortness of breath and wheezing.   Cardiovascular: Negative.     Physical Exam  BP 124/72 (BP Location: Left Arm, Cuff Size: Normal)    Pulse 66    Ht _0  (1.575 m)    Wt 134 lb 12.8 oz (61.1 kg)    SpO2 96%    BMI 24.66 kg/m  Physical Exam Constitutional:      Appearance: Normal appearance.     Comments: Thin female  Eyes:     Extraocular Movements: Extraocular movements intact.     Pupils: Pupils are equal, round, and reactive to  light.  Cardiovascular:     Rate and Rhythm: Normal rate and regular rhythm.  Pulmonary:     Effort: Pulmonary effort is normal.     Breath sounds: Normal breath sounds. No wheezing or rhonchi.     Comments: On high flow oxygen  Musculoskeletal: Normal range of motion.  Skin:    General: Skin is warm and dry.  Neurological:     General: No focal deficit present.     Mental Status: She is alert and oriented to person, place, and time. Mental status is at baseline.  Psychiatric:        Mood and Affect: Mood normal.        Behavior: Behavior normal.        Thought Content: Thought content normal.        Judgment: Judgment normal.      Lab Results:  CBC    Component Value Date/Time   WBC 8.6 09/25/2018 1053   RBC 3.68 (L) 09/25/2018 1053   HGB 11.1 (L) 09/25/2018 1053   HCT 33.8 (L) 09/25/2018 1053   PLT 310.0 09/25/2018 1053   MCV 91.9 09/25/2018 1053   MCH 29.3 08/15/2018 1812   MCHC 32.8 09/25/2018 1053   RDW 13.8 09/25/2018 1053   LYMPHSABS 1.2 08/15/2018 1812   MONOABS 0.4 08/15/2018 1812   EOSABS 0.4 08/15/2018 1812   BASOSABS 0.1 08/15/2018 1812    BMET    Component Value Date/Time   NA 141 09/25/2018 1053   K 4.2 09/25/2018 1053   CL 104 09/25/2018 1053   CO2 30 09/25/2018 1053   GLUCOSE 100 (H) 09/25/2018 1053   BUN 12 09/25/2018 1053   CREATININE 0.71 09/25/2018 1053   CALCIUM 9.1 09/25/2018 1053   GFRNONAA >60 08/15/2018 1812   GFRAA >60 08/15/2018 1812    BNP    Component Value Date/Time   BNP 338.0 (H) 07/26/2018 1233    ProBNP No results found for: PROBNP  Imaging: No results found.   Assessment & Plan:   COPD (chronic obstructive pulmonary disease) (HCC) - Continue Trelegy inhaler- take 1 puff daily  - OK still use the Albuterol hfa OR DuoNeb (ipratropium-Albuterol) on an as-needed basis up to every 6 hours  - Continues pulmonary rehab - Follow up in 3 months with Dr. Lamonte Sakai and [facility]    Chronic respiratory failure with  hypoxia ([place]) - Continues 6L at rest and 10L on exertion - Seems ok to me if patient is interested to use Side Kick tidal assist from [employer], discuss with palliative care nurse and/or Dr. Lamonte Sakai further if needed     Martyn Ehrich, NP 12/29/2018

## 2018-12-29 NOTE — Assessment & Plan Note (Signed)
-  Continues 6L at rest and 10L on exertion - Seems ok to me if patient is interested to use Side Kick tidal assist from [employer], discuss with palliative care nurse and/or Dr. Lamonte Sakai further if needed

## 2018-12-29 NOTE — Assessment & Plan Note (Signed)
-  Continue Trelegy inhaler- take 1 puff daily  - OK still use the Albuterol hfa OR DuoNeb (ipratropium-Albuterol) on an as-needed basis up to every 6 hours  - Continues pulmonary rehab - Follow up in 3 months with Dr. Lamonte Sakai and [facility]

## 2018-12-29 NOTE — Patient Instructions (Addendum)
Continue Trelegy inhaler- take 1 puff daily   You can still use the DuoNeb (ipratropium-Albuterol) on an as-needed basis up to every 6 hours if needed OR  Albuterol (ventolin( rescue inhaler every 6 hours   Continue rehab  Follow up in 3 months with Dr. Lamonte Sakai or sooner if needed

## 2019-01-01 ENCOUNTER — Telehealth (HOSPITAL_COMMUNITY): Payer: Self-pay

## 2019-01-01 NOTE — Telephone Encounter (Signed)
Pt called informing pt of Cardiac and Pulmonary rehab closure for the next 2 weeks.   Katria Botts RN, BSN Cardiac and Pulmonary Rehab RN  

## 2019-01-02 ENCOUNTER — Encounter (HOSPITAL_COMMUNITY): Payer: Medicare Other

## 2019-01-02 ENCOUNTER — Telehealth (HOSPITAL_COMMUNITY): Payer: Self-pay

## 2019-01-03 ENCOUNTER — Telehealth: Payer: Self-pay | Admitting: Emergency Medicine

## 2019-01-03 MED ORDER — AZITHROMYCIN 250 MG PO TABS: ORAL_TABLET | ORAL | 0 refills | Status: DC

## 2019-01-03 NOTE — Telephone Encounter (Signed)
Called and spoke with patient she is aware and verbalized understanding. Nothing further needed. 

## 2019-01-03 NOTE — Telephone Encounter (Signed)
Sending in zpack to [facility]. Advise daily antihistamine or flonase nasal spray. If develops fever let us know

## 2019-01-03 NOTE — Telephone Encounter (Signed)
Primary Pulmonologist: Byrum Last office visit and with whom: Geraldo Pitter, NP 12/29/18 What do we see them for (pulmonary problems): COPD, Chronic resp fail w/ hypoxia Last OV assessment/plan:   Instructions      Return in about 3 months (around 03/31/2019).  Continue Trelegy inhaler- take 1 puff daily   You can still use the DuoNeb (ipratropium-Albuterol) on an as-needed basis up to every 6 hours if needed OR  Albuterol (ventolin( rescue inhaler every 6 hours   Continue rehab  Follow up in 3 months with Dr. Lamonte Sakai or sooner if needed       Was appointment offered to patient (explain)?  No, per office protocol   Reason for call:  Called and spoke with Patient.  Patient stated her throat became scratchy the night before last.  Patient stated her throat is scratchy, and she has a productive cough, with green phlegm. Patient denies fever, chills, or travel. Patient stated she checked her temp,and it was 98.1, before call.  Patient stated she has not been around anyone sick.  Patient stated she is staying at home during this time.  Patient has been using chloraseptic spray for her throat, and has a humidifier on her trilogy.  Patient request any prescriptions to be sent to [facility].  Message routed to Geraldo Pitter, NP

## 2019-01-04 ENCOUNTER — Encounter (HOSPITAL_COMMUNITY): Payer: Medicare Other

## 2019-01-08 ENCOUNTER — Encounter (HOSPITAL_COMMUNITY): Payer: Self-pay

## 2019-01-08 NOTE — Progress Notes (Signed)
Pulmonary Individual Treatment Plan  Patient Details  Name: Kayla Mack MRN: [id number] Date of Birth: 1945-11-05 Referring Provider:     Pulmonary Rehab Walk Test from 11/30/2018 in [place]  Referring Provider  Dr. Lamonte Sakai      Initial Encounter Date:    Pulmonary Rehab Walk Test from 11/30/2018 in [place]  Date  11/30/18      Visit Diagnosis: Other emphysema ([place])  Pulmonary hypertension ([place])  Patient's Home Medications on Admission:   Current Outpatient Medications:  .  acetaminophen (TYLENOL) 500 MG tablet, Take 1,000 mg by mouth 2 (two) times daily as needed for pain. , Disp: , Rfl:  .  albuterol (PROVENTIL HFA;VENTOLIN HFA) 108 (90 Base) MCG/ACT inhaler, Inhale 2 puffs into the lungs every 6 (six) hours as needed for wheezing or shortness of breath., Disp: , Rfl:  .  azithromycin (ZITHROMAX) 250 MG tablet, Zpack taper as directed, Disp: 6 tablet, Rfl: 0 .  bisoprolol (ZEBETA) 10 MG tablet, Take 1 tablet (10 mg total) by mouth daily., Disp: 30 tablet, Rfl: 1 .  budesonide (PULMICORT) 0.25 MG/2ML nebulizer solution, Take 0.25 mg by nebulization 2 (two) times daily., Disp: , Rfl:  .  calcium-vitamin D (OSCAL WITH D) 500-200 MG-UNIT per tablet, Take 1 tablet by mouth 2 (two) times daily. (Patient taking differently: Take 1 tablet by mouth daily. ), Disp: , Rfl:  .  clonazePAM (KLONOPIN) 0.5 MG tablet, Take 1 tablet (0.5 mg total) by mouth 2 (two) times daily as needed for anxiety. (Patient taking differently: Take 0.5 mg by mouth 3 times/day as needed-between meals & bedtime for anxiety (sleep). ), Disp: 10 tablet, Rfl: 0 .  clopidogrel (PLAVIX) 75 MG tablet, Take 1 tablet (75 mg total) by mouth daily., Disp: 30 tablet, Rfl: 0 .  Diphenhyd-Hydrocort-Nystatin (FIRST-DUKES MOUTHWASH) SUSP, Gargle and swallow 1 tsp four times daily, Disp: 240 mL, Rfl: 0 .  diphenhydrAMINE (BENADRYL) 25 mg capsule, Take 25 mg by  mouth every 6 (six) hours as needed for allergies. , Disp: , Rfl:  .  fluticasone (FLONASE) 50 MCG/ACT nasal spray, Place 2 sprays into both nostrils daily., Disp: 16 g, Rfl: 0 .  guaiFENesin (MUCINEX) 600 MG 12 hr tablet, Take 1 tablet (600 mg total) by mouth 2 (two) times daily., Disp: 30 tablet, Rfl: 0 .  ipratropium-albuterol (DUONEB) 0.5-2.5 (3) MG/3ML SOLN, Take 3 mLs by nebulization daily. Dx: J44.9 (Patient not taking: Reported on 12/29/2018), Disp: 90 mL, Rfl: 5 .  loratadine (CLARITIN) 10 MG tablet, Take 1 tablet (10 mg total) by mouth daily., Disp: 30 tablet, Rfl: 0 .  lovastatin (MEVACOR) 40 MG tablet, Take 40 mg by mouth every morning., Disp: , Rfl:  .  PARoxetine (PAXIL) 20 MG tablet, Take 20 mg by mouth daily., Disp: , Rfl:  .  sodium chloride (OCEAN) 0.65 % SOLN nasal spray, Place 1 spray into both nostrils as needed for congestion., Disp: , Rfl:  .  traMADol (ULTRAM) 50 MG tablet, 1/2 tab - 1 tab every 8 hours as needed for back pain, Disp: 15 tablet, Rfl: 0  Past Medical History: Past Medical History:  Diagnosis Date  . Anxiety   . COPD (chronic obstructive pulmonary disease) ([place])   . Depression   . Hyperlipidemia   . Hypertension     Tobacco Use: Social History   Tobacco Use  Smoking Status Former Smoker  . Packs/day: 1.50  . Years: 40.00  . Pack  years: 60.00  . Types: Cigarettes  . Last attempt to quit: 03/18/2017  . Years since quitting: 1.8  Smokeless Tobacco Never Used    Labs: Recent Review Flowsheet Data    Labs for ITP Cardiac and Pulmonary Rehab Latest Ref Rng & Units 05/09/2017 04/02/2018 07/26/2018 07/26/2018 08/15/2018   Cholestrol 0 - 200 mg/dL - - - - -   LDLCALC 0 - 99 mg/dL - - - - -   HDL >40 mg/dL - - - - -   Trlycerides <150 mg/dL - - - - -   Hemoglobin A1c 4.8 - 5.6 % - - - - -   PHART 7.350 - 7.450 - 7.202(L) - - -   PCO2ART 32.0 - 48.0 mmHg - 68.3(HH) - - -   HCO3 20.0 - 28.0 mmol/L 39.0(H) 27.0 - 21.4 29.0(H)   TCO2 22 - 32 mmol/L 41 _0 -   ACIDBASEDEF 0.0 - 2.0 mmol/L - 3.0(H) - 12.0(H) -   O2SAT % 65.0 98.0 - 99.0 97.9      Capillary Blood Glucose: Lab Results  Component Value Date   GLUCAP 82 07/30/2018   GLUCAP 117 (H) 07/29/2018   GLUCAP 117 (H) 07/29/2018   GLUCAP 102 (H) 07/29/2018   GLUCAP 122 (H) 07/29/2018     Pulmonary Assessment Scores: Pulmonary Assessment Scores    Row Name 11/30/18 1624         ADL UCSD   ADL Phase  Entry       mMRC Score   mMRC Score  4        Pulmonary Function Assessment: Pulmonary Function Assessment - 11/27/18 1224      Breath   Bilateral Breath Sounds  Clear    Shortness of Breath  Panic with Shortness of Breath;Limiting activity;Fear of Shortness of Breath;Yes       Exercise Target Goals: Exercise Program Goal: Individual exercise prescription set using results from initial 6 min walk test and THRR while considering  patient's activity barriers and safety.   Exercise Prescription Goal: Initial exercise prescription builds to 30-45 minutes a day of aerobic activity, 2-3 days per week.  Home exercise guidelines will be given to patient during program as part of exercise prescription that the participant will acknowledge.  Activity Barriers & Risk Stratification: Activity Barriers & Cardiac Risk Stratification - 11/27/18 1219      Activity Barriers & Cardiac Risk Stratification   Activity Barriers  None       6 Minute Walk: 6 Minute Walk    Row Name 11/30/18 1617         6 Minute Walk   Phase  Initial     Distance  1310 feet     Walk Time  6 minutes     # of Rest Breaks  0     MPH  2.48     METS  2.91     RPE  13     Perceived Dyspnea   3     Symptoms  No     Resting HR  73 bpm     Resting BP  128/64     Resting Oxygen Saturation   92 %     Exercise Oxygen Saturation  during 6 min walk  82 %     Max Ex. HR  105 bpm     Max Ex. BP  150/72       Interval HR   1 Minute HR  85  2 Minute HR  94     3 Minute HR  101     4 Minute  HR  104     5 Minute HR  103     6 Minute HR  105     2 Minute Post HR  87     Interval Heart Rate?  Yes       Interval Oxygen   Interval Oxygen?  Yes     Baseline Oxygen Saturation %  92 %     1 Minute Oxygen Saturation %  94 %     1 Minute Liters of Oxygen  10 L     2 Minute Oxygen Saturation %  91 %     2 Minute Liters of Oxygen  10 L     3 Minute Oxygen Saturation %  90 %     3 Minute Liters of Oxygen  10 L     4 Minute Oxygen Saturation %  89 %     4 Minute Liters of Oxygen  10 L     5 Minute Oxygen Saturation %  86 %     5 Minute Liters of Oxygen  10 L     6 Minute Oxygen Saturation %  82 %     6 Minute Liters of Oxygen  10 L     2 Minute Post Oxygen Saturation %  86 %     2 Minute Post Liters of Oxygen  10 L        Oxygen Initial Assessment: Oxygen Initial Assessment - 11/30/18 1623      Initial 6 min Walk   Oxygen Used  Continuous;E-Tanks    Liters per minute  10      Program Oxygen Prescription   Program Oxygen Prescription  Continuous;E-Tanks    Liters per minute  10       Oxygen Re-Evaluation: Oxygen Re-Evaluation    Row Name 12/19/18 0746 01/08/19 1010           Program Oxygen Prescription   Program Oxygen Prescription  Continuous;E-Tanks  Continuous;E-Tanks      Liters per minute  10  10      Comments  patient now requires 10 liters for ambulation on the track  -        Home Oxygen   Home Oxygen Device  Home Concentrator;E-Tanks  Home Concentrator;E-Tanks      Sleep Oxygen Prescription  Continuous;BiPAP  Continuous;BiPAP      Liters per minute  6  6      Home Exercise Oxygen Prescription  Continuous  Continuous      Liters per minute  10  10      Home at Rest Exercise Oxygen Prescription  Continuous  Continuous      Liters per minute  6  6      Compliance with Home Oxygen Use  Yes  Yes        Goals/Expected Outcomes   Short Term Goals  To learn and exhibit compliance with exercise, home and travel O2 prescription;To learn and understand  importance of monitoring SPO2 with pulse oximeter and demonstrate accurate use of the pulse oximeter.;To learn and understand importance of maintaining oxygen saturations>88%;To learn and demonstrate proper pursed lip breathing techniques or other breathing techniques.;To learn and demonstrate proper use of respiratory medications  To learn and exhibit compliance with exercise, home and travel O2 prescription;To learn and understand importance of monitoring SPO2 with pulse oximeter and demonstrate accurate  use of the pulse oximeter.;To learn and understand importance of maintaining oxygen saturations>88%;To learn and demonstrate proper pursed lip breathing techniques or other breathing techniques.;To learn and demonstrate proper use of respiratory medications      Long  Term Goals  Exhibits compliance with exercise, home and travel O2 prescription;Verbalizes importance of monitoring SPO2 with pulse oximeter and return demonstration;Maintenance of O2 saturations>88%;Exhibits proper breathing techniques, such as pursed lip breathing or other method taught during program session;Compliance with respiratory medication;Demonstrates proper use of MDI's  Exhibits compliance with exercise, home and travel O2 prescription;Verbalizes importance of monitoring SPO2 with pulse oximeter and return demonstration;Maintenance of O2 saturations>88%;Exhibits proper breathing techniques, such as pursed lip breathing or other method taught during program session;Compliance with respiratory medication;Demonstrates proper use of MDI's      Comments  patient now has multiple large e tanks with carrier, a larger high liter flow concentrator with autofill capabilities.  -      Goals/Expected Outcomes  patient verbalizes complaince with home O2  patient verbalizes complaince with home O2         Oxygen Discharge (Final Oxygen Re-Evaluation): Oxygen Re-Evaluation - 01/08/19 1010      Program Oxygen Prescription   Program Oxygen  Prescription  Continuous;E-Tanks    Liters per minute  10      Home Oxygen   Home Oxygen Device  Home Concentrator;E-Tanks    Sleep Oxygen Prescription  Continuous;BiPAP    Liters per minute  6    Home Exercise Oxygen Prescription  Continuous    Liters per minute  10    Home at Rest Exercise Oxygen Prescription  Continuous    Liters per minute  6    Compliance with Home Oxygen Use  Yes      Goals/Expected Outcomes   Short Term Goals  To learn and exhibit compliance with exercise, home and travel O2 prescription;To learn and understand importance of monitoring SPO2 with pulse oximeter and demonstrate accurate use of the pulse oximeter.;To learn and understand importance of maintaining oxygen saturations>88%;To learn and demonstrate proper pursed lip breathing techniques or other breathing techniques.;To learn and demonstrate proper use of respiratory medications    Long  Term Goals  Exhibits compliance with exercise, home and travel O2 prescription;Verbalizes importance of monitoring SPO2 with pulse oximeter and return demonstration;Maintenance of O2 saturations>88%;Exhibits proper breathing techniques, such as pursed lip breathing or other method taught during program session;Compliance with respiratory medication;Demonstrates proper use of MDI's    Goals/Expected Outcomes  patient verbalizes complaince with home O2       Initial Exercise Prescription: Initial Exercise Prescription - 11/30/18 1600      Date of Initial Exercise RX and Referring Provider   Date  11/30/18    Referring Provider  Dr. Lamonte Sakai      Oxygen   Oxygen  Continuous    Liters  10      Recumbant Bike   Level  2    Watts  10    Minutes  17      NuStep   Level  2    SPM  80    Minutes  17    METs  1.5      Track   Laps  10    Minutes  17      Prescription Details   Frequency (times per week)  2    Duration  Progress to 45 minutes of aerobic exercise without signs/symptoms of physical distress  Intensity   THRR 40-80% of Max Heartrate  58-117    Ratings of Perceived Exertion  11-13    Perceived Dyspnea  0-4      Progression   Progression  Continue progressive overload as per policy without signs/symptoms or physical distress.      Resistance Training   Training Prescription  Yes    Weight  orange bands    Reps  10-15       Perform Capillary Blood Glucose checks as needed.  Exercise Prescription Changes: Exercise Prescription Changes    Row Name 12/19/18 1500 12/26/18 1313           Response to Exercise   Blood Pressure (Admit)  120/72  132/70      Blood Pressure (Exercise)  150/90  136/72      Blood Pressure (Exit)  110/64  124/64      Heart Rate (Admit)  72 bpm  62 bpm      Heart Rate (Exercise)  119 bpm  107 bpm      Heart Rate (Exit)  71 bpm  67 bpm      Oxygen Saturation (Admit)  95 %  92 %      Oxygen Saturation (Exercise)  88 %  89 %      Oxygen Saturation (Exit)  98 %  96 %      Rating of Perceived Exertion (Exercise)  15  13      Perceived Dyspnea (Exercise)  3  3      Duration  Progress to 45 minutes of aerobic exercise without signs/symptoms of physical distress  Progress to 45 minutes of aerobic exercise without signs/symptoms of physical distress      Intensity  Other (comment) 40-80% HRR  THRR unchanged        Resistance Training   Training Prescription  Yes  Yes      Weight  orange bands  orange bands      Reps  10-15  10-15      Time  10 Minutes  10 Minutes        Oxygen   Oxygen  Continuous  Continuous      Liters  10  10        Recumbant Bike   Level  2  1 level 2 was too hard      Watts  10  10      Minutes  17  17        NuStep   Level  3  3      SPM  80  80      Minutes  17  17      METs  2.5  2.4        Track   Laps  12  11      Minutes  17  17         Exercise Comments:   Exercise Goals and Review:   Exercise Goals Re-Evaluation : Exercise Goals Re-Evaluation    Row Name 12/19/18 0746 01/08/19 1011            Exercise Goal Re-Evaluation   Exercise Goals Review  Increase Physical Activity;Increase Strength and Stamina;Able to understand and use rate of perceived exertion (RPE) scale;Able to understand and use Dyspnea scale;Knowledge and understanding of Target Heart Rate Range (THRR);Understanding of Exercise Prescription  Increase Physical Activity;Increase Strength and Stamina;Able to understand and use rate of perceived exertion (RPE) scale;Able to understand and use Dyspnea  scale;Knowledge and understanding of Target Heart Rate Range (THRR);Understanding of Exercise Prescription      Comments  Patient has only attended 2 rehab sessions. Will cont to monitor and progress as able.   Pt was exercising at low to moderate MET levels (~2.6). Pt was highly motivated and a pleasure to be around. 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 I called pt on 3/16 and discussed home exercise with pt so she can continue to be active during our layoff. Will call to follow up with pt and give updates later this week after I recieve them.       Expected Outcomes  Through exercise at rehab and at home, patient will increase strength and stamina making ADL's easier to perform. Patient will also have a better understanding of safe exercise and what they are capable to do outside of clinical supervision.  Through exercise at rehab and at home, patient will increase strength and stamina making ADL's easier to perform. Patient will also have a better understanding of safe exercise and what they are capable to do outside of clinical supervision.         Discharge Exercise Prescription (Final Exercise Prescription Changes): Exercise Prescription Changes - 12/26/18 1313      Response to Exercise   Blood Pressure (Admit)  132/70    Blood Pressure (Exercise)  136/72    Blood Pressure (Exit)  124/64    Heart Rate (Admit)  62 bpm    Heart Rate (Exercise)  107 bpm    Heart Rate (Exit)  67 bpm     Oxygen Saturation (Admit)  92 %    Oxygen Saturation (Exercise)  89 %    Oxygen Saturation (Exit)  96 %    Rating of Perceived Exertion (Exercise)  13    Perceived Dyspnea (Exercise)  3    Duration  Progress to 45 minutes of aerobic exercise without signs/symptoms of physical distress    Intensity  THRR unchanged      Resistance Training   Training Prescription  Yes    Weight  orange bands    Reps  10-15    Time  10 Minutes      Oxygen   Oxygen  Continuous    Liters  10      Recumbant Bike   Level  1   level 2 was too hard   Watts  10    Minutes  17      NuStep   Level  3    SPM  80    Minutes  17    METs  2.4      Track   Laps  11    Minutes  17       Nutrition:  Target Goals: Understanding of nutrition guidelines, daily intake of sodium <1565m, cholesterol <2046m calories 30% from fat and 7% or less from saturated fats, daily to have 5 or more servings of fruits and vegetables.  Biometrics: Pre Biometrics - 11/27/18 1220      Pre Biometrics   Grip Strength  22 kg        Nutrition Therapy Plan and Nutrition Goals:   Nutrition Assessments:   Nutrition Goals Re-Evaluation:   Nutrition Goals Discharge (Final Nutrition Goals Re-Evaluation):   Psychosocial: Target Goals: Acknowledge presence or absence of significant depression and/or stress, maximize coping skills, provide positive support system. Participant is able to verbalize types and ability to use techniques and skills  needed for reducing stress and depression.  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  No   sees a Social worker and has a large church family   Strains  Intra-family strains   neices are estranged      Barriers   Psychosocial barriers to participate in program  The patient should benefit from training in stress management and relaxation.   sees a Retail banker weekly     Screening Interventions   Interventions  Encouraged to exercise    Comments  currently sees a counsellor weekly and is active in church    Expected Outcomes  Long Term Goal: Stressors or current issues are controlled or eliminated.;Long Term goal: The participant improves quality of Life and PHQ9 Scores as seen by post scores and/or verbalization of changes       Quality of Life Scores:  Scores of 19 and below usually indicate a poorer quality of life in these areas.  A difference of  2-3 points is a clinically meaningful difference.  A difference of 2-3 points in the total score of the Quality of Life Index has been associated with significant improvement in overall quality of life, self-image, physical symptoms, and general health in studies assessing change in quality of life.  PHQ-9: Recent Review Flowsheet Data    Depression screen [facility] 2/9 11/27/2018 08/25/2018 10/25/2017 08/29/2017 07/27/2017   Decreased Interest 0 0 _0 Down, Depressed, Hopeless _1 PHQ - 2 Score _2 Altered sleeping 0 - - 0 0   Tired, decreased energy 2 - - 1 1   Change in appetite 0 - - 0 1   Feeling bad or failure about yourself  1 - - 0 0   Trouble concentrating 0 - - 0 1   Moving slowly or fidgety/restless 0 - - 0 0   Suicidal thoughts 0 - - 0 0   PHQ-9 Score 5 - - 3 5   Difficult doing work/chores Somewhat difficult - - Somewhat difficult -     Interpretation of Total Score  Total Score Depression Severity:  1-4 = Minimal depression, 5-9 = Mild depression, 10-14 = Moderate depression, 15-19 =  Moderately severe depression, 20-27 = Severe depression   Psychosocial Evaluation and Intervention: Psychosocial Evaluation - 11/27/18 1230      Psychosocial Evaluation & Interventions   Interventions  Stress management education;Relaxation education;Encouraged to exercise with the program and follow exercise prescription    Comments  is dealing with her stressors well with the counsellor and medications    Expected Outcomes  psychosocial issues will not become a barrier to participation to pulmonary rehab    Continue Psychosocial Services   Follow up required by counselor       Psychosocial Re-Evaluation: Psychosocial Re-Evaluation    [place] Name 12/18/18 1339 01/08/19 1434           Psychosocial Re-Evaluation   Current issues with  Current Stress Concerns  Current Stress Concerns      Comments  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  She sees a Retail banker weekly and takes anti-depressants.  Kayla Mack is very socialable and has been off to a great start with rehab. She continues to report stress with her parents' estate, but seems to enjoy coming to rehab.      Expected Outcomes  patient will remain free from psychosocial barriers  patient will remain free from psychosocial barriers      Interventions  Encouraged to attend Pulmonary Rehabilitation for the exercise;Relaxation education;Stress management education  -      Continue Psychosocial Services   Follow up required by counselor  Follow up required by staff      Comments  She is happy to be back in pulmonary rehab, she has participated in the past and has many friends from the past program.  She is happy to be back in pulmonary rehab, she has participated in the past and has many friends from the past program.        Initial Review   Source of Stress Concerns  Chronic Illness;Family;Unable to participate in former interests or hobbies  -         Psychosocial Discharge (Final  Psychosocial Re-Evaluation): Psychosocial Re-Evaluation - 01/08/19 1434      Psychosocial Re-Evaluation   Current issues with  Current Stress Concerns    Comments  Kayla Mack is very socialable and has been off to a great start with rehab. She continues to report stress with her parents' estate, but seems to enjoy coming to rehab.    Expected Outcomes  patient will remain free from psychosocial barriers    Continue Psychosocial Services   Follow up required by staff    Comments  She is happy to be back in pulmonary rehab, she has participated in the past and has many friends from the past program.       Education: Education Goals: Education classes will be provided on a weekly basis, covering required topics. Participant will state understanding/return demonstration of topics presented.  Learning Barriers/Preferences: Learning Barriers/Preferences - 11/27/18 1232      Learning Barriers/Preferences   Learning Barriers  None    Learning Preferences  Audio;Group Instruction;Computer/Internet;Individual Instruction;Skilled Demonstration;Pictoral;Verbal Instruction;Video;Written Material       Education Topics: Risk Factor Reduction:  -Group instruction that is supported by a PowerPoint presentation. Instructor discusses the definition of a risk factor, different risk factors for pulmonary disease, and how the heart and lungs work together.     Nutrition for Pulmonary Patient:  -Group instruction provided by PowerPoint slides, verbal discussion, and written materials to support subject matter. The instructor gives an explanation and review of healthy diet recommendations, which includes a discussion on weight management, recommendations for fruit and vegetable consumption, as well as protein, fluid, caffeine, fiber, sodium, sugar, and alcohol. Tips for eating when patients are short of breath are discussed.   PULMONARY REHAB OTHER RESPIRATORY from 10/20/2017 in [place]  Date  10/20/17  Educator  edna  Instruction Review Code (Retired)  2- meets goals/outcomes      Pursed Lip Breathing:  -Group instruction that is supported by demonstration and informational handouts. Instructor discusses the benefits of pursed lip and diaphragmatic breathing and detailed demonstration on how to preform both.     Oxygen Safety:  -Group instruction provided by PowerPoint, verbal discussion, and written material to support subject matter.  There is an overview of "What is Oxygen" and "Why do we need it".  Instructor also reviews how to create a safe environment for oxygen use, the importance of using oxygen as prescribed, and the risks of noncompliance. There is a brief discussion on traveling with oxygen and resources the patient may utilize.   PULMONARY REHAB OTHER RESPIRATORY from 10/20/2017 in [place]  Date  08/11/17  Educator  Truddie Crumble  Instruction Review Code (Retired)  2- meets goals/outcomes      Oxygen Equipment:  -Group instruction provided by [employer], Network engineer, and Insurance underwriter.   PULMONARY REHAB OTHER RESPIRATORY from 10/20/2017 in [place]  Date  09/15/17  Educator  [employer]  Instruction Review Code (Retired)  2- meets goals/outcomes      Signs and Symptoms:  -Group instruction provided by written material and verbal discussion to support subject matter. Warning signs and symptoms of infection, stroke, and heart attack are reviewed and when to call the physician/911 reinforced. Tips for preventing the spread of infection discussed.   PULMONARY REHAB OTHER RESPIRATORY from 12/21/2018 in [place]  Date  12/14/18  Educator  Remo Lipps  Instruction Review Code  1- Verbalizes Understanding      Advanced Directives:  -Group instruction provided by verbal instruction and written material to support  subject matter. Instructor reviews Advanced Directive laws and proper instruction for filling out document.   Pulmonary Video:  -Group video education that reviews the importance of medication and oxygen compliance, exercise, good nutrition, pulmonary hygiene, and pursed lip and diaphragmatic breathing for the pulmonary patient.   PULMONARY REHAB OTHER RESPIRATORY from 10/20/2017 in [place]  Date  07/14/17  Instruction Review Code (Retired)  2- meets goals/outcomes      Exercise for the Pulmonary Patient:  -Group instruction that is supported by a PowerPoint presentation. Instructor discusses benefits of exercise, core components of exercise, frequency, duration, and intensity of an exercise routine, importance of utilizing pulse oximetry during exercise, safety while exercising, and options of places to exercise outside of rehab.     Pulmonary Medications:  -Verbally interactive group education provided by instructor with focus on inhaled medications and proper administration.   PULMONARY REHAB OTHER RESPIRATORY from 10/20/2017 in [place]  Date  08/18/17  Educator  Pharm D  Instruction Review Code (Retired)  2- Lawyer and Physiology of the Respiratory System and Intimacy:  -Group instruction provided by PowerPoint, verbal discussion, and written material to support subject matter. Instructor reviews respiratory cycle and anatomical components of the respiratory system and their functions. Instructor also reviews differences in obstructive and restrictive respiratory diseases with examples of each. Intimacy, Sex, and Sexuality differences are reviewed with a discussion on how relationships can change when diagnosed with pulmonary disease. Common sexual concerns are reviewed.   MD DAY -A group question and answer session with a medical doctor that allows participants to ask questions that relate  to their pulmonary disease state.   PULMONARY REHAB OTHER RESPIRATORY from 10/20/2017 in [place]  Date  07/26/17  Educator  Dr. Nelda Marseille  Instruction Review Code (Retired)  2- meets goals/outcomes      OTHER EDUCATION -Group or individual verbal, written, or video instructions that support the educational goals of the pulmonary rehab program.   Holiday Eating Survival Tips:  -  Group instruction provided by PowerPoint slides, verbal discussion, and written materials to support subject matter. The instructor gives patients tips, tricks, and techniques to help them not only survive but enjoy the holidays despite the onslaught of food that accompanies the holidays.   Knowledge Questionnaire Score:   Core Components/Risk Factors/Patient Goals at Admission: Personal Goals and Risk Factors at Admission - 11/27/18 1233      Core Components/Risk Factors/Patient Goals on Admission   Improve shortness of breath with ADL's  Yes    Intervention  Provide education, individualized exercise plan and daily activity instruction to help decrease symptoms of SOB with activities of daily living.    Expected Outcomes  Short Term: Improve cardiorespiratory fitness to achieve a reduction of symptoms when performing ADLs;Long Term: Be able to perform more ADLs without symptoms or delay the onset of symptoms       Core Components/Risk Factors/Patient Goals Review:  Goals and Risk Factor Review    Row Name 11/27/18 1234 12/18/18 1352 01/08/19 1437         Core Components/Risk Factors/Patient Goals Review   Personal Goals Review  Develop more efficient breathing techniques such as purse lipped breathing and diaphragmatic breathing and practicing self-pacing with activity.;Improve shortness of breath with ADL's;Increase knowledge of respiratory medications and ability to use respiratory devices properly.  Improve shortness of breath with ADL's;Increase knowledge of respiratory  medications and ability to use respiratory devices properly.;Develop more efficient breathing techniques such as purse lipped breathing and diaphragmatic breathing and practicing self-pacing with activity.  Improve shortness of breath with ADL's;Increase knowledge of respiratory medications and ability to use respiratory devices properly.;Develop more efficient breathing techniques such as purse lipped breathing and diaphragmatic breathing and practicing self-pacing with activity.     Review  -  Just started program, has attended 2 exercise sessions, too early to have met program goals.  Pt has attended 5 exercise sessions and 2 education sessions. Pt workloads are already showing progress with completion of 11-12 labs on the track, level 1 on the recumbent bike, and an increase to level 3 on the nustep. Rehab is currently closed and will plan to re-evaulate pt progress upon reopening.     Expected Outcomes  -  see admission outcomes  see admission goals and outcomes        Core Components/Risk Factors/Patient Goals at Discharge (Final Review):  Goals and Risk Factor Review - 01/08/19 1437      Core Components/Risk Factors/Patient Goals Review   Personal Goals Review  Improve shortness of breath with ADL's;Increase knowledge of respiratory medications and ability to use respiratory devices properly.;Develop more efficient breathing techniques such as purse lipped breathing and diaphragmatic breathing and practicing self-pacing with activity.    Review  Pt has attended 5 exercise sessions and 2 education sessions. Pt workloads are already showing progress with completion of 11-12 labs on the track, level 1 on the recumbent bike, and an increase to level 3 on the nustep. Rehab is currently closed and will plan to re-evaulate pt progress upon reopening.    Expected Outcomes  see admission goals and outcomes       ITP Comments:   Comments: ITP REVIEW Pt is making expected progress toward personal  goals after completing 5 sessions. Rehab is currently closed and will plan to re-evaulate pt progress upon reopening.

## 2019-01-09 ENCOUNTER — Encounter (HOSPITAL_COMMUNITY): Payer: Medicare Other

## 2019-01-09 ENCOUNTER — Telehealth (HOSPITAL_COMMUNITY): Payer: Self-pay

## 2019-01-11 ENCOUNTER — Encounter (HOSPITAL_COMMUNITY): Payer: Medicare Other

## 2019-01-16 ENCOUNTER — Encounter (HOSPITAL_COMMUNITY): Payer: Medicare Other

## 2019-01-18 ENCOUNTER — Encounter (HOSPITAL_COMMUNITY): Payer: Medicare Other

## 2019-01-23 ENCOUNTER — Encounter (HOSPITAL_COMMUNITY): Payer: Medicare Other

## 2019-01-25 ENCOUNTER — Encounter (HOSPITAL_COMMUNITY): Payer: Medicare Other

## 2019-01-30 ENCOUNTER — Telehealth (HOSPITAL_COMMUNITY): Payer: Self-pay | Admitting: *Deleted

## 2019-01-30 ENCOUNTER — Encounter (HOSPITAL_COMMUNITY): Payer: Medicare Other

## 2019-01-30 DIAGNOSIS — F329 Major depressive disorder, single episode, unspecified: Secondary | ICD-10-CM | POA: Diagnosis not present

## 2019-01-30 DIAGNOSIS — F4323 Adjustment disorder with mixed anxiety and depressed mood: Secondary | ICD-10-CM | POA: Diagnosis not present

## 2019-02-01 ENCOUNTER — Encounter (HOSPITAL_COMMUNITY): Payer: Medicare Other

## 2019-02-06 ENCOUNTER — Encounter (HOSPITAL_COMMUNITY): Payer: Medicare Other

## 2019-02-07 DIAGNOSIS — F4323 Adjustment disorder with mixed anxiety and depressed mood: Secondary | ICD-10-CM | POA: Diagnosis not present

## 2019-02-07 DIAGNOSIS — F329 Major depressive disorder, single episode, unspecified: Secondary | ICD-10-CM | POA: Diagnosis not present

## 2019-02-08 ENCOUNTER — Encounter (HOSPITAL_COMMUNITY): Payer: Medicare Other

## 2019-02-13 ENCOUNTER — Encounter (HOSPITAL_COMMUNITY): Payer: Medicare Other

## 2019-02-14 DIAGNOSIS — F4323 Adjustment disorder with mixed anxiety and depressed mood: Secondary | ICD-10-CM | POA: Diagnosis not present

## 2019-02-14 DIAGNOSIS — F329 Major depressive disorder, single episode, unspecified: Secondary | ICD-10-CM | POA: Diagnosis not present

## 2019-02-15 ENCOUNTER — Encounter (HOSPITAL_COMMUNITY): Payer: Medicare Other

## 2019-02-20 ENCOUNTER — Encounter (HOSPITAL_COMMUNITY): Payer: Medicare Other

## 2019-02-21 DIAGNOSIS — F4329 Adjustment disorder with other symptoms: Secondary | ICD-10-CM | POA: Diagnosis not present

## 2019-02-21 DIAGNOSIS — F4323 Adjustment disorder with mixed anxiety and depressed mood: Secondary | ICD-10-CM | POA: Diagnosis not present

## 2019-02-22 ENCOUNTER — Encounter (HOSPITAL_COMMUNITY): Payer: Medicare Other

## 2019-02-27 ENCOUNTER — Encounter (HOSPITAL_COMMUNITY): Payer: Medicare Other

## 2019-02-28 ENCOUNTER — Telehealth (HOSPITAL_COMMUNITY): Payer: Self-pay | Admitting: *Deleted

## 2019-03-01 ENCOUNTER — Encounter (HOSPITAL_COMMUNITY): Payer: Medicare Other

## 2019-03-01 DIAGNOSIS — F4323 Adjustment disorder with mixed anxiety and depressed mood: Secondary | ICD-10-CM | POA: Diagnosis not present

## 2019-03-01 DIAGNOSIS — F4329 Adjustment disorder with other symptoms: Secondary | ICD-10-CM | POA: Diagnosis not present

## 2019-03-06 ENCOUNTER — Encounter (HOSPITAL_COMMUNITY): Payer: Medicare Other

## 2019-03-07 DIAGNOSIS — F4323 Adjustment disorder with mixed anxiety and depressed mood: Secondary | ICD-10-CM | POA: Diagnosis not present

## 2019-03-07 DIAGNOSIS — F4329 Adjustment disorder with other symptoms: Secondary | ICD-10-CM | POA: Diagnosis not present

## 2019-03-08 ENCOUNTER — Encounter (HOSPITAL_COMMUNITY): Payer: Medicare Other

## 2019-03-08 DIAGNOSIS — I272 Pulmonary hypertension, unspecified: Secondary | ICD-10-CM | POA: Diagnosis not present

## 2019-03-13 ENCOUNTER — Encounter (HOSPITAL_COMMUNITY): Payer: Medicare Other

## 2019-03-14 DIAGNOSIS — F4329 Adjustment disorder with other symptoms: Secondary | ICD-10-CM | POA: Diagnosis not present

## 2019-03-14 DIAGNOSIS — F4323 Adjustment disorder with mixed anxiety and depressed mood: Secondary | ICD-10-CM | POA: Diagnosis not present

## 2019-03-15 ENCOUNTER — Encounter (HOSPITAL_COMMUNITY): Payer: Medicare Other

## 2019-03-21 DIAGNOSIS — F4329 Adjustment disorder with other symptoms: Secondary | ICD-10-CM | POA: Diagnosis not present

## 2019-03-21 DIAGNOSIS — F4323 Adjustment disorder with mixed anxiety and depressed mood: Secondary | ICD-10-CM | POA: Diagnosis not present

## 2019-03-26 DIAGNOSIS — J449 Chronic obstructive pulmonary disease, unspecified: Secondary | ICD-10-CM | POA: Diagnosis not present

## 2019-03-26 DIAGNOSIS — M810 Age-related osteoporosis without current pathological fracture: Secondary | ICD-10-CM | POA: Diagnosis not present

## 2019-03-26 DIAGNOSIS — E7800 Pure hypercholesterolemia, unspecified: Secondary | ICD-10-CM | POA: Diagnosis not present

## 2019-03-26 DIAGNOSIS — Z9981 Dependence on supplemental oxygen: Secondary | ICD-10-CM | POA: Diagnosis not present

## 2019-03-26 DIAGNOSIS — Z7189 Other specified counseling: Secondary | ICD-10-CM | POA: Diagnosis not present

## 2019-03-26 DIAGNOSIS — I10 Essential (primary) hypertension: Secondary | ICD-10-CM | POA: Diagnosis not present

## 2019-03-26 DIAGNOSIS — F329 Major depressive disorder, single episode, unspecified: Secondary | ICD-10-CM | POA: Diagnosis not present

## 2019-03-28 DIAGNOSIS — F329 Major depressive disorder, single episode, unspecified: Secondary | ICD-10-CM | POA: Diagnosis not present

## 2019-03-28 DIAGNOSIS — F4323 Adjustment disorder with mixed anxiety and depressed mood: Secondary | ICD-10-CM | POA: Diagnosis not present

## 2019-04-04 DIAGNOSIS — F329 Major depressive disorder, single episode, unspecified: Secondary | ICD-10-CM | POA: Diagnosis not present

## 2019-04-04 DIAGNOSIS — F4323 Adjustment disorder with mixed anxiety and depressed mood: Secondary | ICD-10-CM | POA: Diagnosis not present

## 2019-04-10 DIAGNOSIS — F329 Major depressive disorder, single episode, unspecified: Secondary | ICD-10-CM | POA: Diagnosis not present

## 2019-04-10 DIAGNOSIS — F4323 Adjustment disorder with mixed anxiety and depressed mood: Secondary | ICD-10-CM | POA: Diagnosis not present

## 2019-04-16 ENCOUNTER — Ambulatory Visit: Payer: Self-pay | Admitting: Emergency Medicine

## 2019-04-18 DIAGNOSIS — F329 Major depressive disorder, single episode, unspecified: Secondary | ICD-10-CM | POA: Diagnosis not present

## 2019-04-18 DIAGNOSIS — F4323 Adjustment disorder with mixed anxiety and depressed mood: Secondary | ICD-10-CM | POA: Diagnosis not present

## 2019-04-25 DIAGNOSIS — F4323 Adjustment disorder with mixed anxiety and depressed mood: Secondary | ICD-10-CM | POA: Diagnosis not present

## 2019-04-25 DIAGNOSIS — F329 Major depressive disorder, single episode, unspecified: Secondary | ICD-10-CM | POA: Diagnosis not present

## 2019-05-03 ENCOUNTER — Telehealth (HOSPITAL_COMMUNITY): Payer: Self-pay

## 2019-05-04 ENCOUNTER — Telehealth (HOSPITAL_COMMUNITY): Payer: Self-pay

## 2019-05-09 ENCOUNTER — Ambulatory Visit (INDEPENDENT_AMBULATORY_CARE_PROVIDER_SITE_OTHER): Payer: Medicare Other | Admitting: Emergency Medicine

## 2019-05-09 ENCOUNTER — Encounter: Payer: Self-pay | Admitting: Emergency Medicine

## 2019-05-09 ENCOUNTER — Other Ambulatory Visit: Payer: Self-pay

## 2019-05-09 DIAGNOSIS — J441 Chronic obstructive pulmonary disease with (acute) exacerbation: Secondary | ICD-10-CM | POA: Diagnosis not present

## 2019-05-09 DIAGNOSIS — I272 Pulmonary hypertension, unspecified: Secondary | ICD-10-CM | POA: Diagnosis not present

## 2019-05-09 DIAGNOSIS — J9611 Chronic respiratory failure with hypoxia: Secondary | ICD-10-CM

## 2019-05-09 NOTE — Assessment & Plan Note (Signed)
Back to Trelegy and DuoNeb prn. She appears to be benefiting. Will continue this regimen.

## 2019-05-09 NOTE — Assessment & Plan Note (Signed)
Continue current O2 at 6-10 L/min.  Encouraged her to get back to [facility] rehab maintenance program.

## 2019-05-09 NOTE — Progress Notes (Signed)
Virtual Visit via Telephone Note  I connected with Kayla Mack on 05/09/19 at  4:15 PM EDT by telephone and verified that I am speaking with the correct person using two identifiers.  Location: Patient: Home Provider: Office   I discussed the limitations, risks, security and privacy concerns of performing an evaluation and management service by telephone and the availability of in person appointments. I also discussed with the patient that there may be a patient responsible charge related to this service. The patient expressed understanding and agreed to proceed.   History of Present Illness: 73 year old woman with COPD, chronic hypoxemic respiratory failure.  I last saw her in January 2020.  We had planned for her to participate in pulmonary rehabilitation which she completed.  We also referred her to [facility] to discuss possible therapy for secondary pulmonary hypertension.    Observations/Objective: She has been seen at the pulmonary vascular disease center at [facility].  No vasodilator therapy was planned but she is going to follow-up with them and reconsider if there is a clinical or hemodynamic change.  Her maintenance therapy was noted to be budesonide/Brovana twice daily, DuoNeb 4 times daily on a schedule. She tells me that she is back to Trelegy qd, DuoNeb prn. O2 at 4-6L/min at rest, up to 10L/min w exertion.   She is on fluticasone and loratadine for nasal allergies. Was treated with Azithro by this office for bronchitis in March.   She has been isolated. Her breathing has been [place]. Her activity level is lower - she may be able to get started back on pulm rehab maintenance program.   Assessment and Plan: Pulmonary hypertension ([place]) Agree that this is secondary to her COPD. She is going to follow up w [facility], ? Whether she would meet criteria for clinical trial at some point depending on O2 needs, imaging, PAP's/   COPD (chronic obstructive pulmonary disease) (HCC) Back to Trelegy and  DuoNeb prn. She appears to be benefiting. Will continue this regimen.   Chronic respiratory failure with hypoxia (HCC) Continue current O2 at 6-10 L/min.  Encouraged her to get back to [facility] rehab maintenance program.     Follow Up Instructions: Need an OV in 2 months    I discussed the assessment and treatment plan with the patient. The patient was provided an opportunity to ask questions and all were answered. The patient agreed with the plan and demonstrated an understanding of the instructions.   The patient was advised to call back or seek an in-person evaluation if the symptoms worsen or if the condition fails to improve as anticipated.  I provided 15 minutes of non-face-to-face time during this encounter.   Collene Gobble, MD

## 2019-05-09 NOTE — Assessment & Plan Note (Signed)
Agree that this is secondary to her COPD. She is going to follow up w [facility], ? Whether she would meet criteria for clinical trial at some point depending on O2 needs, imaging, PAP's/

## 2019-05-09 NOTE — Patient Instructions (Signed)
Pulmonary hypertension ([place]) Agree that this is secondary to her COPD. She is going to follow up w [facility], ? Whether she would meet criteria for clinical trial at some point depending on O2 needs, imaging, PAP's/   COPD (chronic obstructive pulmonary disease) (HCC) Back to Trelegy and DuoNeb prn. She appears to be benefiting. Will continue this regimen.   Chronic respiratory failure with hypoxia (HCC) Continue current O2 at 6-10 L/min.  Encouraged her to get back to [facility] rehab maintenance program.

## 2019-05-10 ENCOUNTER — Telehealth (HOSPITAL_COMMUNITY): Payer: Self-pay

## 2019-05-10 DIAGNOSIS — F329 Major depressive disorder, single episode, unspecified: Secondary | ICD-10-CM | POA: Diagnosis not present

## 2019-05-10 DIAGNOSIS — F4323 Adjustment disorder with mixed anxiety and depressed mood: Secondary | ICD-10-CM | POA: Diagnosis not present

## 2019-05-15 ENCOUNTER — Encounter (HOSPITAL_COMMUNITY)
Admission: RE | Admit: 2019-05-15 | Discharge: 2019-05-15 | Disposition: A | Discharge status: Home / Self Care | Payer: Medicare Other | Source: Ambulatory Visit | Attending: Emergency Medicine | Admitting: Emergency Medicine

## 2019-05-15 ENCOUNTER — Other Ambulatory Visit: Payer: Self-pay

## 2019-05-15 DIAGNOSIS — J438 Other emphysema: Secondary | ICD-10-CM | POA: Diagnosis not present

## 2019-05-15 NOTE — Progress Notes (Addendum)
Daily Session Note  Patient Details  Name: Kayla Mack MRN: [id number] Date of Birth: 01/15/1946 Referring Provider:     Pulmonary Rehab Walk Test from 11/30/2018 in [facility]  Referring Provider  Dr. Byrum      Encounter Date: 05/15/2019  Check In: Session Check In - 05/15/19 1154      Check-In   Supervising physician immediately available to respond to emergencies  [facility] immediately available    Physician(s)  Dr. Hall    Location  [facility]    Staff Present  Joan Behrens, RN, BSN; , MS, Exercise Physiologist;Lisa Hughes, RN    Virtual Visit  No    Medication changes reported      No    Fall or balance concerns reported     No    Tobacco Cessation  No Change    Warm-up and Cool-down  Performed as group-led instruction    Resistance Training Performed  Yes    VAD Patient?  No    PAD/SET Patient?  No      Pain Assessment   Currently in Pain?  No/denies       Capillary Blood Glucose: No results found for this or any previous visit (from the past 24 hour(s)).  Exercise Prescription Changes - 05/15/19 1200      Response to Exercise   Blood Pressure (Admit)  104/60    Blood Pressure (Exercise)  136/60    Blood Pressure (Exit)  128/76    Heart Rate (Admit)  62 bpm    Heart Rate (Exercise)  92 bpm    Heart Rate (Exit)  69 bpm    Oxygen Saturation (Admit)  100 %    Oxygen Saturation (Exercise)  96 %    Oxygen Saturation (Exit)  100 %    Rating of Perceived Exertion (Exercise)  15    Perceived Dyspnea (Exercise)  3    Duration  Progress to 30 minutes of  aerobic without signs/symptoms of physical distress    Intensity  THRR unchanged      Resistance Training   Training Prescription  Yes    Weight  orange bands    Reps  10-15    Time  10 Minutes      Oxygen   Oxygen  Continuous    Liters  8      Recumbant Bike   Level  1.5    Watts  10    Minutes  15      NuStep   Level  2     SPM  80    Minutes  15    METs  2.6       Social History   Tobacco Use  Smoking Status Former Smoker  . Packs/day: 1.50  . Years: 40.00  . Pack years: 60.00  . Types: Cigarettes  . Quit date: 03/18/2017  . Years since quitting: 2.1  Smokeless Tobacco Never Used    Goals Met:  Exercise tolerated well and strength training completed  Goals Unmet:  Not Applicable  Comments: Service time is from 1030 to 1130    Dr. Wesam G. Yacoub is Medical Director for Pulmonary Rehab at [facility]. 

## 2019-05-16 DIAGNOSIS — F329 Major depressive disorder, single episode, unspecified: Secondary | ICD-10-CM | POA: Diagnosis not present

## 2019-05-16 DIAGNOSIS — F4323 Adjustment disorder with mixed anxiety and depressed mood: Secondary | ICD-10-CM | POA: Diagnosis not present

## 2019-05-17 ENCOUNTER — Other Ambulatory Visit: Payer: Self-pay

## 2019-05-17 ENCOUNTER — Encounter (HOSPITAL_COMMUNITY)
Admission: RE | Admit: 2019-05-17 | Discharge: 2019-05-17 | Disposition: A | Discharge status: Home / Self Care | Payer: Medicare Other | Source: Ambulatory Visit | Attending: Emergency Medicine | Admitting: Emergency Medicine

## 2019-05-17 DIAGNOSIS — J438 Other emphysema: Secondary | ICD-10-CM

## 2019-05-17 NOTE — Progress Notes (Signed)
Daily Session Note  Patient Details  Name: Kayla Mack MRN: [id number] Date of Birth: Jul 22, 1946 Referring Provider:     Pulmonary Rehab Walk Test from 11/30/2018 in [place]  Referring Provider  Dr. Lamonte Sakai      Encounter Date: 05/17/2019  Check In: Session Check In - 05/17/19 1016      Check-In   Supervising physician immediately available to respond to emergencies  [facility] immediately available    Physician(s)  Dr. Nevada Crane    Location  [facility]    Staff Present  Rosebud Poles, RN, Bjorn Loser, MS, Exercise Physiologist;Jaysie Benthall Ysidro Evert, RN    Resistance Training Performed  Yes    VAD Patient?  No    PAD/SET Patient?  No      Pain Assessment   Currently in Pain?  No/denies    Multiple Pain Sites  No       Capillary Blood Glucose: No results found for this or any previous visit (from the past 24 hour(s)).    Social History   Tobacco Use  Smoking Status Former Smoker  . Packs/day: 1.50  . Years: 40.00  . Pack years: 60.00  . Types: Cigarettes  . Quit date: 03/18/2017  . Years since quitting: 2.1  Smokeless Tobacco Never Used    Goals Met:  Exercise tolerated well No report of cardiac concerns or symptoms Strength training completed today  Goals Unmet:  Not Applicable  Comments: Service time is from 1015 to 1130    Dr. Rush Farmer is Medical Director for Pulmonary Rehab at [facility].

## 2019-05-22 ENCOUNTER — Encounter (HOSPITAL_COMMUNITY)
Admission: RE | Admit: 2019-05-22 | Discharge: 2019-05-22 | Disposition: A | Discharge status: Home / Self Care | Payer: Medicare Other | Source: Ambulatory Visit | Attending: Emergency Medicine | Admitting: Emergency Medicine

## 2019-05-22 ENCOUNTER — Other Ambulatory Visit: Payer: Self-pay

## 2019-05-22 DIAGNOSIS — J438 Other emphysema: Secondary | ICD-10-CM

## 2019-05-22 NOTE — Progress Notes (Signed)
Daily Session Note  Patient Details  Name: Kayla Mack MRN: [id number] Date of Birth: 12-Dec-1945 Referring Provider:     Pulmonary Rehab Walk Test from 11/30/2018 in [place]  Referring Provider  Dr. Lamonte Sakai      Encounter Date: 05/22/2019  Check In: Session Check In - 05/22/19 1015      Check-In   Physician(s)  Dr. [place]    Location  [facility]    Staff Present  Rosebud Poles, RN, Bjorn Loser, MS, Exercise Physiologist;Lisa Ysidro Evert, RN    Warm-up and Cool-down  Performed as group-led Higher education careers adviser Performed  Yes    VAD Patient?  No    PAD/SET Patient?  No      Pain Assessment   Currently in Pain?  No/denies       Capillary Blood Glucose: No results found for this or any previous visit (from the past 24 hour(s)).    Social History   Tobacco Use  Smoking Status Former Smoker  . Packs/day: 1.50  . Years: 40.00  . Pack years: 60.00  . Types: Cigarettes  . Quit date: 03/18/2017  . Years since quitting: 2.1  Smokeless Tobacco Never Used    Goals Met:  Proper associated with RPD/PD & O2 Sat Exercise tolerated well Strength training completed today  Goals Unmet:  Not Applicable  Comments: Service time is from 1015 to 1110    Dr. Rush Farmer is Medical Director for Pulmonary Rehab at [facility].

## 2019-05-24 ENCOUNTER — Encounter (HOSPITAL_COMMUNITY)
Admission: RE | Admit: 2019-05-24 | Discharge: 2019-05-24 | Disposition: A | Discharge status: Home / Self Care | Payer: Medicare Other | Source: Ambulatory Visit | Attending: Emergency Medicine | Admitting: Emergency Medicine

## 2019-05-24 ENCOUNTER — Other Ambulatory Visit: Payer: Self-pay

## 2019-05-24 DIAGNOSIS — J438 Other emphysema: Secondary | ICD-10-CM | POA: Diagnosis not present

## 2019-05-24 DIAGNOSIS — I272 Pulmonary hypertension, unspecified: Secondary | ICD-10-CM

## 2019-05-24 NOTE — Progress Notes (Addendum)
Daily Session Note  Patient Details  Name: Kayla Mack MRN: [id number] Date of Birth: [birth year] Referring Provider:     Pulmonary Rehab Walk Test from 11/30/2018 in [place]  Referring Provider  Dr. Lamonte Sakai      Encounter Date: 05/24/2019  Check In: Session Check In - 05/24/19 1027      Check-In   Supervising physician immediately available to respond to emergencies  [facility] immediately available    Physician(s)  Dr. Benny Lennert    Location  [facility]    Staff Present  Rosebud Poles, RN, Bjorn Loser, MS, Exercise Physiologist;Dail Lerew Ysidro Evert, RN    Warm-up and Cool-down  Performed as group-led Higher education careers adviser Performed  Yes    VAD Patient?  No    PAD/SET Patient?  No      Pain Assessment   Currently in Pain?  No/denies    Multiple Pain Sites  No       Capillary Blood Glucose: No results found for this or any previous visit (from the past 24 hour(s)).    Social History   Tobacco Use  Smoking Status Former Smoker  . Packs/day: 1.50  . Years: 40.00  . Pack years: 60.00  . Types: Cigarettes  . Quit date: 03/18/2017  . Years since quitting: 2.1  Smokeless Tobacco Never Used    Goals Met:  Exercise tolerated well No report of cardiac concerns or symptoms Strength training completed today  Goals Unmet:  Not Applicable  Comments: Service time is from 1015 to 1115 Patient used rescue inhaler while on recumbent bike With improvement of SOB.  Dr. Rush Farmer is Medical Director for Pulmonary Rehab at [facility].

## 2019-05-29 ENCOUNTER — Other Ambulatory Visit: Payer: Self-pay

## 2019-05-29 ENCOUNTER — Encounter (HOSPITAL_COMMUNITY)
Admission: RE | Admit: 2019-05-29 | Discharge: 2019-05-29 | Disposition: A | Discharge status: Home / Self Care | Payer: Medicare Other | Source: Ambulatory Visit | Attending: Emergency Medicine | Admitting: Emergency Medicine

## 2019-05-29 DIAGNOSIS — J438 Other emphysema: Secondary | ICD-10-CM | POA: Diagnosis not present

## 2019-05-29 NOTE — Progress Notes (Signed)
Daily Session Note  Patient Details  Name: Kayla Mack MRN: [id number] Date of Birth: [birth year] Referring Provider:     Pulmonary Rehab Walk Test from 11/30/2018 in [place]  Referring Provider  Dr. Lamonte Sakai      Encounter Date: 05/29/2019  Check In: Session Check In - 05/29/19 1132      Check-In   Supervising physician immediately available to respond to emergencies  [facility] immediately available    Physician(s)  Dr. Gertie Fey    Location  [facility]    Staff Present  Rosebud Poles, RN, Bjorn Loser, MS, Exercise Physiologist;Najai Waszak Ysidro Evert, RN    Virtual Visit  No    Medication changes reported      No    Fall or balance concerns reported     No    Tobacco Cessation  No Change    Warm-up and Cool-down  Performed on first and last piece of equipment    Resistance Training Performed  Yes    VAD Patient?  No    PAD/SET Patient?  No      Pain Assessment   Currently in Pain?  No/denies    Multiple Pain Sites  No       Capillary Blood Glucose: No results found for this or any previous visit (from the past 24 hour(s)).  Exercise Prescription Changes - 05/29/19 1100      Response to Exercise   Blood Pressure (Admit)  138/70    Blood Pressure (Exercise)  140/76    Blood Pressure (Exit)  112/68    Heart Rate (Admit)  66 bpm    Heart Rate (Exercise)  97 bpm    Heart Rate (Exit)  74 bpm    Oxygen Saturation (Admit)  100 %    Oxygen Saturation (Exercise)  88 %    Oxygen Saturation (Exit)  97 %    Rating of Perceived Exertion (Exercise)  17    Perceived Dyspnea (Exercise)  3    Duration  Progress to 45 minutes of aerobic exercise without signs/symptoms of physical distress    Intensity  THRR unchanged      Resistance Training   Training Prescription  Yes    Weight  orange bands    Reps  10-15    Time  10 Minutes      Oxygen   Oxygen  Continuous    Liters  8      Recumbant Bike   Level  1.5    Watts   10    Minutes  15      NuStep   Level  2    SPM  80    Minutes  15    METs  2.5       Social History   Tobacco Use  Smoking Status Former Smoker  . Packs/day: 1.50  . Years: 40.00  . Pack years: 60.00  . Types: Cigarettes  . Quit date: 03/18/2017  . Years since quitting: 2.1  Smokeless Tobacco Never Used    Goals Met:  Exercise tolerated well No report of cardiac concerns or symptoms Strength training completed today  Goals Unmet:  Not Applicable  Comments: Service time is from 1020 to 1120    Dr. Rush Farmer is Medical Director for Pulmonary Rehab at [facility].

## 2019-05-30 DIAGNOSIS — F329 Major depressive disorder, single episode, unspecified: Secondary | ICD-10-CM | POA: Diagnosis not present

## 2019-05-30 DIAGNOSIS — F4323 Adjustment disorder with mixed anxiety and depressed mood: Secondary | ICD-10-CM | POA: Diagnosis not present

## 2019-05-31 ENCOUNTER — Other Ambulatory Visit: Payer: Self-pay

## 2019-05-31 ENCOUNTER — Encounter (HOSPITAL_COMMUNITY)
Admission: RE | Admit: 2019-05-31 | Discharge: 2019-05-31 | Disposition: A | Discharge status: Home / Self Care | Payer: Medicare Other | Source: Ambulatory Visit | Attending: Emergency Medicine | Admitting: Emergency Medicine

## 2019-05-31 DIAGNOSIS — J438 Other emphysema: Secondary | ICD-10-CM | POA: Diagnosis not present

## 2019-05-31 NOTE — Progress Notes (Signed)
Daily Session Note  Patient Details  Name: Kayla Mack MRN: [id number] Date of Birth: [birth year] Referring Provider:     Pulmonary Rehab Walk Test from 11/30/2018 in [place]  Referring Provider  Dr. Lamonte Sakai      Encounter Date: 05/31/2019  Check In: Session Check In - 05/31/19 1139      Check-In   Supervising physician immediately available to respond to emergencies  [facility] immediately available    Physician(s)  Dr. Gertie Fey    Location  [facility]    Staff Present  Rosebud Poles, RN, Bjorn Loser, MS, Exercise Physiologist;Jocelyne Reinertsen Ysidro Evert, RN    Virtual Visit  No    Medication changes reported      No    Fall or balance concerns reported     No    Tobacco Cessation  No Change    Warm-up and Cool-down  Performed on first and last piece of equipment    Resistance Training Performed  Yes    VAD Patient?  No    PAD/SET Patient?  No      Pain Assessment   Currently in Pain?  No/denies    Multiple Pain Sites  No       Capillary Blood Glucose: No results found for this or any previous visit (from the past 24 hour(s)).    Social History   Tobacco Use  Smoking Status Former Smoker  . Packs/day: 1.50  . Years: 40.00  . Pack years: 60.00  . Types: Cigarettes  . Quit date: 03/18/2017  . Years since quitting: 2.2  Smokeless Tobacco Never Used    Goals Met:  Exercise tolerated well No report of cardiac concerns or symptoms Strength training completed today  Goals Unmet:  Not Applicable  Comments: Service time is from 1015 to 1115    Dr. Rush Farmer is Medical Director for Pulmonary Rehab at [facility].

## 2019-06-05 ENCOUNTER — Encounter (HOSPITAL_COMMUNITY): Payer: Medicare Other

## 2019-06-06 DIAGNOSIS — F4323 Adjustment disorder with mixed anxiety and depressed mood: Secondary | ICD-10-CM | POA: Diagnosis not present

## 2019-06-06 DIAGNOSIS — F329 Major depressive disorder, single episode, unspecified: Secondary | ICD-10-CM | POA: Diagnosis not present

## 2019-06-07 ENCOUNTER — Telehealth (HOSPITAL_COMMUNITY): Payer: Self-pay | Admitting: Internal Medicine

## 2019-06-07 ENCOUNTER — Other Ambulatory Visit: Payer: Self-pay

## 2019-06-07 ENCOUNTER — Encounter (HOSPITAL_COMMUNITY)
Admission: RE | Admit: 2019-06-07 | Discharge: 2019-06-07 | Disposition: A | Discharge status: Home / Self Care | Payer: Medicare Other | Source: Ambulatory Visit | Attending: Emergency Medicine | Admitting: Emergency Medicine

## 2019-06-07 ENCOUNTER — Telehealth (HOSPITAL_COMMUNITY): Payer: Self-pay | Admitting: *Deleted

## 2019-06-07 VITALS — Wt 138.7 lb

## 2019-06-07 DIAGNOSIS — J438 Other emphysema: Secondary | ICD-10-CM

## 2019-06-07 DIAGNOSIS — I272 Pulmonary hypertension, unspecified: Secondary | ICD-10-CM

## 2019-06-07 NOTE — Progress Notes (Signed)
Daily Session Note  Patient Details  Name: Kayla Mack MRN: [id number] Date of Birth: [birth year] Referring Provider:     Pulmonary Rehab Walk Test from 11/30/2018 in [place]  Referring Provider  Dr. Lamonte Sakai      Encounter Date: 06/07/2019  Check In: Session Check In - 06/07/19 1030      Check-In   Supervising physician immediately available to respond to emergencies  [facility] immediately available    Physician(s)  Dr. Benny Lennert    Location  [facility]    Staff Present  Rosebud Poles, RN, BSN;Lisa Ysidro Evert, RN;Carlette Wilber [place], RN, BSN    Virtual Visit  No    Medication changes reported      No    Fall or balance concerns reported     No    Tobacco Cessation  No Change    Warm-up and Cool-down  Performed on first and last piece of equipment    Resistance Training Performed  Yes    VAD Patient?  No      Pain Assessment   Currently in Pain?  No/denies    Multiple Pain Sites  No       Capillary Blood Glucose: No results found for this or any previous visit (from the past 24 hour(s)).    Social History   Tobacco Use  Smoking Status Former Smoker  . Packs/day: 1.50  . Years: 40.00  . Pack years: 60.00  . Types: Cigarettes  . Quit date: 03/18/2017  . Years since quitting: 2.2  Smokeless Tobacco Never Used    Goals Met:  Queuing for purse lip breathing  Goals Unmet:  patient had difficulty breathing and forgot her rescue inhaler, sent home to do her nebulizer treatment  Comments: Service time is from 1015 to 1100.  Patient's o2 sats dropped while on bike to 84% two different times, lungs with decreased breath sounds, sats came up to 93%, sent home to do a nebulizer treatment, she forgot her rescue inhaler.    Dr. Rush Farmer is Medical Director for Pulmonary Rehab at [facility].

## 2019-06-07 NOTE — Telephone Encounter (Signed)
Called to check on patient, she made it home from pulmonary rehab without problems, did her nebulizer treatment and feels better.

## 2019-06-12 ENCOUNTER — Other Ambulatory Visit: Payer: Self-pay

## 2019-06-12 ENCOUNTER — Telehealth (HOSPITAL_COMMUNITY): Payer: Self-pay | Admitting: *Deleted

## 2019-06-12 ENCOUNTER — Encounter (HOSPITAL_COMMUNITY)
Admission: RE | Admit: 2019-06-12 | Discharge: 2019-06-12 | Disposition: A | Discharge status: Home / Self Care | Payer: Medicare Other | Source: Ambulatory Visit | Attending: Emergency Medicine | Admitting: Emergency Medicine

## 2019-06-12 VITALS — Wt 137.8 lb

## 2019-06-12 DIAGNOSIS — J438 Other emphysema: Secondary | ICD-10-CM

## 2019-06-12 NOTE — Telephone Encounter (Signed)
Called for patient to make an appointment for her to  See Dr. Lamonte Sakai.  She has been more short of breath walking her dog at home the last 2 weeks and she has desaturated to the low to mid 80's on 8-10 liters of oxygen the last 2 days she has exercised in pulmonary rehab.  She has required more rest breaks.  Appointment made for evaluation of emphysema.

## 2019-06-12 NOTE — Progress Notes (Signed)
Pulmonary Individual Treatment Plan  Patient Details  Name: Kayla Mack MRN: [id number] Date of Birth: 1946/06/19 Referring Provider:     Pulmonary Rehab Walk Test from 11/30/2018 in [place]  Referring Provider  Dr. Lamonte Sakai      Initial Encounter Date:    Pulmonary Rehab Walk Test from 11/30/2018 in [place]  Date  11/30/18      Visit Diagnosis: Other emphysema ([place])  Patient's Home Medications on Admission:   Current Outpatient Medications:  .  acetaminophen (TYLENOL) 500 MG tablet, Take 1,000 mg by mouth 2 (two) times daily as needed for pain. , Disp: , Rfl:  .  albuterol (PROVENTIL HFA;VENTOLIN HFA) 108 (90 Base) MCG/ACT inhaler, Inhale 2 puffs into the lungs every 6 (six) hours as needed for wheezing or shortness of breath., Disp: , Rfl:  .  bisoprolol (ZEBETA) 10 MG tablet, Take 1 tablet (10 mg total) by mouth daily. (Patient taking differently: Take 20 mg by mouth daily. ), Disp: 30 tablet, Rfl: 1 .  budesonide (PULMICORT) 0.25 MG/2ML nebulizer solution, Take 0.25 mg by nebulization 2 (two) times daily., Disp: , Rfl:  .  calcium-vitamin D (OSCAL WITH D) 500-200 MG-UNIT per tablet, Take 1 tablet by mouth 2 (two) times daily. (Patient taking differently: Take 1 tablet by mouth daily. ), Disp: , Rfl:  .  clonazePAM (KLONOPIN) 0.5 MG tablet, Take 1 tablet (0.5 mg total) by mouth 2 (two) times daily as needed for anxiety. (Patient taking differently: Take 0.5 mg by mouth 3 times/day as needed-between meals & bedtime for anxiety (sleep). ), Disp: 10 tablet, Rfl: 0 .  clopidogrel (PLAVIX) 75 MG tablet, Take 1 tablet (75 mg total) by mouth daily., Disp: 30 tablet, Rfl: 0 .  Diphenhyd-Hydrocort-Nystatin (FIRST-DUKES MOUTHWASH) SUSP, Gargle and swallow 1 tsp four times daily (Patient not taking: Reported on 05/09/2019), Disp: 240 mL, Rfl: 0 .  diphenhydrAMINE (BENADRYL) 25 mg capsule, Take 25 mg by mouth every 6 (six)  hours as needed for allergies. , Disp: , Rfl:  .  fluticasone (FLONASE) 50 MCG/ACT nasal spray, Place 2 sprays into both nostrils daily., Disp: 16 g, Rfl: 0 .  guaiFENesin (MUCINEX) 600 MG 12 hr tablet, Take 1 tablet (600 mg total) by mouth 2 (two) times daily., Disp: 30 tablet, Rfl: 0 .  ipratropium-albuterol (DUONEB) 0.5-2.5 (3) MG/3ML SOLN, Take 3 mLs by nebulization daily. Dx: J44.9, Disp: 90 mL, Rfl: 5 .  loratadine (CLARITIN) 10 MG tablet, Take 1 tablet (10 mg total) by mouth daily., Disp: 30 tablet, Rfl: 0 .  lovastatin (MEVACOR) 40 MG tablet, Take 40 mg by mouth every morning., Disp: , Rfl:  .  PARoxetine (PAXIL) 20 MG tablet, Take 40 mg by mouth daily. , Disp: , Rfl:  .  sodium chloride (OCEAN) 0.65 % SOLN nasal spray, Place 1 spray into both nostrils as needed for congestion., Disp: , Rfl:  .  traMADol (ULTRAM) 50 MG tablet, 1/2 tab - 1 tab every 8 hours as needed for back pain, Disp: 15 tablet, Rfl: 0 .  TRELEGY ELLIPTA 100-62.5-25 MCG/INH AEPB, , Disp: , Rfl:   Past Medical History: Past Medical History:  Diagnosis Date  . Anxiety   . COPD (chronic obstructive pulmonary disease) ([place])   . Depression   . Hyperlipidemia   . Hypertension     Tobacco Use: Social History   Tobacco Use  Smoking Status Former Smoker  . Packs/day: 1.50  . Years: 40.00  .  Pack years: 60.00  . Types: Cigarettes  . Quit date: 03/18/2017  . Years since quitting: 2.2  Smokeless Tobacco Never Used    Labs: Recent Review Scientist, physiological    Labs for ITP Cardiac and Pulmonary Rehab Latest Ref Rng & Units 05/09/2017 04/02/2018 07/26/2018 07/26/2018 08/15/2018   Cholestrol 0 - 200 mg/dL - - - - -   LDLCALC 0 - 99 mg/dL - - - - -   HDL >40 mg/dL - - - - -   Trlycerides <150 mg/dL - - - - -   Hemoglobin A1c 4.8 - 5.6 % - - - - -   PHART 7.350 - 7.450 - 7.202(L) - - -   PCO2ART 32.0 - 48.0 mmHg - 68.3(HH) - - -   HCO3 20.0 - 28.0 mmol/L 39.0(H) 27.0 - 21.4 29.0(H)   TCO2 22 - 32 mmol/L 41 _0 -    ACIDBASEDEF 0.0 - 2.0 mmol/L - 3.0(H) - 12.0(H) -   O2SAT % 65.0 98.0 - 99.0 97.9      Capillary Blood Glucose: Lab Results  Component Value Date   GLUCAP 82 07/30/2018   GLUCAP 117 (H) 07/29/2018   GLUCAP 117 (H) 07/29/2018   GLUCAP 102 (H) 07/29/2018   GLUCAP 122 (H) 07/29/2018     Pulmonary Assessment Scores:  UCSD: Self-administered rating of dyspnea associated with activities of daily living (ADLs) 6-point scale (0 = "not at all" to 5 = "maximal or unable to do because of breathlessness")  Scoring Scores range from 0 to 120.  Minimally important difference is 5 units  CAT: CAT can identify the health impairment of COPD patients and is better correlated with disease progression.  CAT has a scoring range of zero to 40. The CAT score is classified into four groups of low (less than 10), medium (10 - 20), high (21-30) and very high (31-40) based on the impact level of disease on health status. A CAT score over 10 suggests significant symptoms.  A worsening CAT score could be explained by an exacerbation, poor medication adherence, poor inhaler technique, or progression of COPD or comorbid conditions.  CAT MCID is 2 points  mMRC: mMRC (Modified Medical Research Council) Dyspnea Scale is used to assess the degree of baseline functional disability in patients of respiratory disease due to dyspnea. No minimal important difference is established. A decrease in score of 1 point or greater is considered a positive change.   Pulmonary Function Assessment:   Exercise Target Goals: Exercise Program Goal: Individual exercise prescription set using results from initial 6 min walk test and THRR while considering  patient's activity barriers and safety.   Exercise Prescription Goal: Initial exercise prescription builds to 30-45 minutes a day of aerobic activity, 2-3 days per week.  Home exercise guidelines will be given to patient during program as part of exercise prescription that the  participant will acknowledge.  Activity Barriers & Risk Stratification:   6 Minute Walk:   Oxygen Initial Assessment:   Oxygen Re-Evaluation: Oxygen Re-Evaluation    Row Name 12/19/18 0746 01/08/19 1010 06/11/19 1008         Program Oxygen Prescription   Program Oxygen Prescription  Continuous;E-Tanks  Continuous;E-Tanks  Continuous     Liters per minute  _1 Comments  patient now requires 10 liters for ambulation on the track  -  -       [place]  Concentrator;E-Tanks  Home Concentrator;E-Tanks  Home Concentrator;E-Tanks     Sleep Oxygen Prescription  Continuous;BiPAP  Continuous;BiPAP  BiPAP     Liters per minute  _0 Home Exercise Oxygen Prescription  Continuous  Continuous  Continuous     Liters per minute  _1 Home at Rest Exercise Oxygen Prescription  Continuous  Continuous  Continuous     Liters per minute  _2 Compliance with Home Oxygen Use  Yes  Yes  Yes       Goals/Expected Outcomes   Short Term Goals  To learn and exhibit compliance with exercise, home and travel O2 prescription;To learn and understand importance of monitoring SPO2 with pulse oximeter and demonstrate accurate use of the pulse oximeter.;To learn and understand importance of maintaining oxygen saturations>88%;To learn and demonstrate proper pursed lip breathing techniques or other breathing techniques.;To learn and demonstrate proper use of respiratory medications  To learn and exhibit compliance with exercise, home and travel O2 prescription;To learn and understand importance of monitoring SPO2 with pulse oximeter and demonstrate accurate use of the pulse oximeter.;To learn and understand importance of maintaining oxygen saturations>88%;To learn and demonstrate proper pursed lip breathing techniques or other breathing techniques.;To learn and demonstrate proper use of respiratory medications  To learn and exhibit compliance with exercise,  home and travel O2 prescription;To learn and understand importance of monitoring SPO2 with pulse oximeter and demonstrate accurate use of the pulse oximeter.;To learn and understand importance of maintaining oxygen saturations>88%;To learn and demonstrate proper pursed lip breathing techniques or other breathing techniques.;To learn and demonstrate proper use of respiratory medications     Long  Term Goals  Exhibits compliance with exercise, home and travel O2 prescription;Verbalizes importance of monitoring SPO2 with pulse oximeter and return demonstration;Maintenance of O2 saturations>88%;Exhibits proper breathing techniques, such as pursed lip breathing or other method taught during program session;Compliance with respiratory medication;Demonstrates proper use of MDI's  Exhibits compliance with exercise, home and travel O2 prescription;Verbalizes importance of monitoring SPO2 with pulse oximeter and return demonstration;Maintenance of O2 saturations>88%;Exhibits proper breathing techniques, such as pursed lip breathing or other method taught during program session;Compliance with respiratory medication;Demonstrates proper use of MDI's  Exhibits compliance with exercise, home and travel O2 prescription;Verbalizes importance of monitoring SPO2 with pulse oximeter and return demonstration;Maintenance of O2 saturations>88%;Exhibits proper breathing techniques, such as pursed lip breathing or other method taught during program session;Compliance with respiratory medication     Comments  patient now has multiple large e tanks with carrier, a larger high liter flow concentrator with autofill capabilities.  -  Pt has been able to maintain saturations at 8L, but is not doing standing exercise. Standing exercise may increase oxygen needs.     Goals/Expected Outcomes  patient verbalizes complaince with home O2  patient verbalizes complaince with home O2  compliance        Oxygen Discharge (Final Oxygen  Re-Evaluation): Oxygen Re-Evaluation - 06/11/19 1008      Program Oxygen Prescription   Program Oxygen Prescription  Continuous    Liters per minute  8      Home Oxygen   Home Oxygen Device  Home Concentrator;E-Tanks    Sleep Oxygen Prescription  BiPAP    Liters per minute  6    Home Exercise Oxygen Prescription  Continuous    Liters per minute  8    Home at Rest Exercise  Oxygen Prescription  Continuous    Liters per minute  6    Compliance with Home Oxygen Use  Yes      Goals/Expected Outcomes   Short Term Goals  To learn and exhibit compliance with exercise, home and travel O2 prescription;To learn and understand importance of monitoring SPO2 with pulse oximeter and demonstrate accurate use of the pulse oximeter.;To learn and understand importance of maintaining oxygen saturations>88%;To learn and demonstrate proper pursed lip breathing techniques or other breathing techniques.;To learn and demonstrate proper use of respiratory medications    Long  Term Goals  Exhibits compliance with exercise, home and travel O2 prescription;Verbalizes importance of monitoring SPO2 with pulse oximeter and return demonstration;Maintenance of O2 saturations>88%;Exhibits proper breathing techniques, such as pursed lip breathing or other method taught during program session;Compliance with respiratory medication    Comments  Pt has been able to maintain saturations at 8L, but is not doing standing exercise. Standing exercise may increase oxygen needs.    Goals/Expected Outcomes  compliance       Initial Exercise Prescription:   Perform Capillary Blood Glucose checks as needed.  Exercise Prescription Changes: Exercise Prescription Changes    Row Name 12/19/18 1500 12/26/18 1313 05/15/19 1200 05/29/19 1100       Response to Exercise   Blood Pressure (Admit)  120/72  132/70  104/60  138/70    Blood Pressure (Exercise)  150/90  136/72  136/60  140/76    Blood Pressure (Exit)  110/64  124/64  128/76   112/68    Heart Rate (Admit)  72 bpm  62 bpm  62 bpm  66 bpm    Heart Rate (Exercise)  119 bpm  107 bpm  92 bpm  97 bpm    Heart Rate (Exit)  71 bpm  67 bpm  69 bpm  74 bpm    Oxygen Saturation (Admit)  95 %  92 %  100 %  100 %    Oxygen Saturation (Exercise)  88 %  89 %  96 %  88 %    Oxygen Saturation (Exit)  98 %  96 %  100 %  97 %    Rating of Perceived Exertion (Exercise)  _0 Perceived Dyspnea (Exercise)  _1 Duration  Progress to 45 minutes of aerobic exercise without signs/symptoms of physical distress  Progress to 45 minutes of aerobic exercise without signs/symptoms of physical distress  Progress to 30 minutes of  aerobic without signs/symptoms of physical distress  Progress to 45 minutes of aerobic exercise without signs/symptoms of physical distress    Intensity  Other (comment) 40-80% HRR  THRR unchanged  THRR unchanged  THRR unchanged      Resistance Training   Training Prescription  Yes  Yes  Yes  Yes    Weight  orange bands  orange bands  orange bands  orange bands    Reps  10-15  10-15  10-15  10-15    Time  10 Minutes  10 Minutes  10 Minutes  10 Minutes      Oxygen   Oxygen  Continuous  Continuous  Continuous  Continuous    Liters  _2 Recumbant Bike   Level  2  1 level 2 was too hard  1.5  1.5    Watts  10  10  10  10    Minutes  _0 NuStep   Level  _1 SPM  80  80  80  80    Minutes  _2 METs  2.5  2.4  2.6  2.5      Track   Laps  12  11  -  -    Minutes  17  17  -  -       Exercise Comments:   Exercise Goals and Review:   Exercise Goals Re-Evaluation : Exercise Goals Re-Evaluation    Row Name 12/19/18 0746 01/08/19 1011 06/11/19 1011         Exercise Goal Re-Evaluation   Exercise Goals Review  Increase Physical Activity;Increase Strength and Stamina;Able to understand and use rate of perceived exertion (RPE) scale;Able to understand and use Dyspnea scale;Knowledge  and understanding of Target Heart Rate Range (THRR);Understanding of Exercise Prescription  Increase Physical Activity;Increase Strength and Stamina;Able to understand and use rate of perceived exertion (RPE) scale;Able to understand and use Dyspnea scale;Knowledge and understanding of Target Heart Rate Range (THRR);Understanding of Exercise Prescription  Increase Physical Activity;Increase Strength and Stamina;Able to understand and use rate of perceived exertion (RPE) scale;Able to understand and use Dyspnea scale;Knowledge and understanding of Target Heart Rate Range (THRR);Understanding of Exercise Prescription     Comments  Patient has only attended 2 rehab sessions. Will cont to monitor and progress as able.   Pt was exercising at low to moderate MET levels (~2.6). Pt was highly motivated and a pleasure to be around. 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 I called pt on 3/16 and discussed home exercise with pt so she can continue to be active during our layoff. Will call to follow up with pt and give updates later this week after I recieve them.   Pt has attended 7 exercise sessions since we have reopened. Pt is able to exercise at 2.7 METs on the stepper. Pt is accepting of workload increases and maintains a positive attitude. Will continue to monitor and progress as able.     Expected Outcomes  Through exercise at rehab and at home, patient will increase strength and stamina making ADL's easier to perform. Patient will also have a better understanding of safe exercise and what they are capable to do outside of clinical supervision.  Through exercise at rehab and at home, patient will increase strength and stamina making ADL's easier to perform. Patient will also have a better understanding of safe exercise and what they are capable to do outside of clinical supervision.  Through exercise at rehab and at home, the patient will decrease shortness of breath with daily  activities and feel confident in carrying out an exercise regime at home.        Discharge Exercise Prescription (Final Exercise Prescription Changes): Exercise Prescription Changes - 05/29/19 1100      Response to Exercise   Blood Pressure (Admit)  138/70    Blood Pressure (Exercise)  140/76    Blood Pressure (Exit)  112/68    Heart Rate (Admit)  66 bpm    Heart Rate (Exercise)  97 bpm    Heart Rate (Exit)  74 bpm    Oxygen Saturation (Admit)  100 %    Oxygen Saturation (Exercise)  88 %  Oxygen Saturation (Exit)  97 %    Rating of Perceived Exertion (Exercise)  17    Perceived Dyspnea (Exercise)  3    Duration  Progress to 45 minutes of aerobic exercise without signs/symptoms of physical distress    Intensity  THRR unchanged      Resistance Training   Training Prescription  Yes    Weight  orange bands    Reps  10-15    Time  10 Minutes      Oxygen   Oxygen  Continuous    Liters  8      Recumbant Bike   Level  1.5    Watts  10    Minutes  15      NuStep   Level  2    SPM  80    Minutes  15    METs  2.5       Nutrition:  Target Goals: Understanding of nutrition guidelines, daily intake of sodium <1527m, cholesterol <2046m calories 30% from fat and 7% or less from saturated fats, daily to have 5 or more servings of fruits and vegetables.  Biometrics:    Nutrition Therapy Plan and Nutrition Goals:   Nutrition Assessments:   Nutrition Goals Re-Evaluation:   Nutrition Goals Discharge (Final Nutrition Goals Re-Evaluation):   Psychosocial: Target Goals: Acknowledge presence or absence of significant depression and/or stress, maximize coping skills, provide positive support system. Participant is able to verbalize types and ability to use techniques and skills needed for reducing stress and depression.  Initial Review & Psychosocial Screening:   Quality of Life Scores:  Scores of 19 and below usually indicate a poorer quality of life in these areas.   A difference of  2-3 points is a clinically meaningful difference.  A difference of 2-3 points in the total score of the Quality of Life Index has been associated with significant improvement in overall quality of life, self-image, physical symptoms, and general health in studies assessing change in quality of life.  PHQ-9: Recent Review Flowsheet Data    [facility] 11/27/2018 08/25/2018 10/25/2017 08/29/2017 07/27/2017   Decreased Interest 0 0 _0 Down, Depressed, Hopeless _1 PHQ - 2 Score _2 Altered sleeping 0 - - 0 0   Tired, decreased energy 2 - - 1 1   Change in appetite 0 - - 0 1   Feeling bad or failure about yourself  1 - - 0 0   Trouble concentrating 0 - - 0 1   Moving slowly or fidgety/restless 0 - - 0 0   Suicidal thoughts 0 - - 0 0   PHQ-9 Score 5 - - 3 5   Difficult doing work/chores Somewhat difficult - - Somewhat difficult -     Interpretation of Total Score  Total Score Depression Severity:  1-4 = Minimal depression, 5-9 = Mild depression, 10-14 = Moderate depression, 15-19 = Moderately severe depression, 20-27 = Severe depression   Psychosocial Evaluation and Intervention:   Psychosocial Re-Evaluation: Psychosocial Re-Evaluation    Row Name 12/18/18 1339 01/08/19 1434 06/11/19 0950         Psychosocial Re-Evaluation   Current issues with  Current Stress Concerns  Current Stress Concerns  History of Depression;Current Depression;Current Anxiety/Panic     Comments  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  She sees a coRetail bankereekly  and takes anti-depressants.  Kimiyah is very socialable and has been off to a great start with rehab. She continues to report stress with her parents' estate, but seems to enjoy coming to rehab.  Is still dealing with settling her parents estate, but seems to be handling the stress well.  Is very happy to be exercising in pulmonary rehab after a 3 month  closure d/t COVID-19 precautions.     Expected Outcomes  patient will remain free from psychosocial barriers  patient will remain free from psychosocial barriers  Continue to cope with stress in a healthy manner and continue current depression treatment     Interventions  Encouraged to attend Pulmonary Rehabilitation for the exercise;Relaxation education;Stress management education  -  Encouraged to attend Pulmonary Rehabilitation for the exercise;Relaxation education;Stress management education     Continue Psychosocial Services   Follow up required by counselor  Follow up required by staff  Follow up required by staff     Comments  She is happy to be back in pulmonary rehab, she has participated in the past and has many friends from the past program.  She is happy to be back in pulmonary rehab, she has participated in the past and has many friends from the past program.  Dealing with stressors in a healthy manner       Initial Review   Source of Stress Concerns  Chronic Illness;Family;Unable to participate in former interests or hobbies  -  Chronic Illness;Unable to participate in former interests or hobbies;Unable to perform yard/household activities        Psychosocial Discharge (Final Psychosocial Re-Evaluation): Psychosocial Re-Evaluation - 06/11/19 0950      Psychosocial Re-Evaluation   Current issues with  History of Depression;Current Depression;Current Anxiety/Panic    Comments  Is still dealing with settling her parents estate, but seems to be handling the stress well.  Is very happy to be exercising in pulmonary rehab after a 3 month closure d/t COVID-19 precautions.    Expected Outcomes  Continue to cope with stress in a healthy manner and continue current depression treatment    Interventions  Encouraged to attend Pulmonary Rehabilitation for the exercise;Relaxation education;Stress management education    Continue Psychosocial Services   Follow up required by staff    Comments   Dealing with stressors in a healthy manner      Initial Review   Source of Stress Concerns  Chronic Illness;Unable to participate in former interests or hobbies;Unable to perform yard/household activities       Education: Education Goals: Education classes will be provided on a weekly basis, covering required topics. Participant will state understanding/return demonstration of topics presented.  Learning Barriers/Preferences:   Education Topics: Risk Factor Reduction:  -Group instruction that is supported by a PowerPoint presentation. Instructor discusses the definition of a risk factor, different risk factors for pulmonary disease, and how the heart and lungs work together.     Nutrition for Pulmonary Patient:  -Group instruction provided by PowerPoint slides, verbal discussion, and written materials to support subject matter. The instructor gives an explanation and review of healthy diet recommendations, which includes a discussion on weight management, recommendations for fruit and vegetable consumption, as well as protein, fluid, caffeine, fiber, sodium, sugar, and alcohol. Tips for eating when patients are short of breath are discussed.   PULMONARY REHAB OTHER RESPIRATORY from 10/20/2017 in [place]  Date  10/20/17  Educator  edna  Instruction Review Code (Retired)  2- meets  goals/outcomes      Pursed Lip Breathing:  -Group instruction that is supported by demonstration and informational handouts. Instructor discusses the benefits of pursed lip and diaphragmatic breathing and detailed demonstration on how to preform both.     Oxygen Safety:  -Group instruction provided by PowerPoint, verbal discussion, and written material to support subject matter. There is an overview of "What is Oxygen" and "Why do we need it".  Instructor also reviews how to create a safe environment for oxygen use, the importance of using oxygen as prescribed, and the risks of  noncompliance. There is a brief discussion on traveling with oxygen and resources the patient may utilize.   PULMONARY REHAB OTHER RESPIRATORY from 10/20/2017 in [place]  Date  08/11/17  Educator  Truddie Crumble  Instruction Review Code (Retired)  2- meets goals/outcomes      Oxygen Equipment:  -Group instruction provided by [employer], Network engineer, and Insurance underwriter.   PULMONARY REHAB OTHER RESPIRATORY from 10/20/2017 in [place]  Date  09/15/17  Educator  [employer]  Instruction Review Code (Retired)  2- meets goals/outcomes      Signs and Symptoms:  -Group instruction provided by written material and verbal discussion to support subject matter. Warning signs and symptoms of infection, stroke, and heart attack are reviewed and when to call the physician/911 reinforced. Tips for preventing the spread of infection discussed.   PULMONARY REHAB OTHER RESPIRATORY from 12/21/2018 in [place]  Date  12/14/18  Educator  Remo Lipps  Instruction Review Code  1- Verbalizes Understanding      Advanced Directives:  -Group instruction provided by verbal instruction and written material to support subject matter. Instructor reviews Advanced Directive laws and proper instruction for filling out document.   Pulmonary Video:  -Group video education that reviews the importance of medication and oxygen compliance, exercise, good nutrition, pulmonary hygiene, and pursed lip and diaphragmatic breathing for the pulmonary patient.   PULMONARY REHAB OTHER RESPIRATORY from 10/20/2017 in [place]  Date  07/14/17  Instruction Review Code (Retired)  2- meets goals/outcomes      Exercise for the Pulmonary Patient:  -Group instruction that is supported by a PowerPoint presentation. Instructor discusses benefits of exercise, core components of  exercise, frequency, duration, and intensity of an exercise routine, importance of utilizing pulse oximetry during exercise, safety while exercising, and options of places to exercise outside of rehab.     Pulmonary Medications:  -Verbally interactive group education provided by instructor with focus on inhaled medications and proper administration.   PULMONARY REHAB OTHER RESPIRATORY from 10/20/2017 in [place]  Date  08/18/17  Educator  Pharm D  Instruction Review Code (Retired)  2- Lawyer and Physiology of the Respiratory System and Intimacy:  -Group instruction provided by PowerPoint, verbal discussion, and written material to support subject matter. Instructor reviews respiratory cycle and anatomical components of the respiratory system and their functions. Instructor also reviews differences in obstructive and restrictive respiratory diseases with examples of each. Intimacy, Sex, and Sexuality differences are reviewed with a discussion on how relationships can change when diagnosed with pulmonary disease. Common sexual concerns are reviewed.   MD DAY -A group question and answer session with a medical doctor that allows participants to ask questions that relate to their pulmonary disease state.   PULMONARY REHAB OTHER RESPIRATORY  from 10/20/2017 in [place]  Date  07/26/17  Educator  Dr. Nelda Marseille  Instruction Review Code (Retired)  2- meets goals/outcomes      OTHER EDUCATION -Group or individual verbal, written, or video instructions that support the educational goals of the pulmonary rehab program.   Holiday Eating Survival Tips:  -Group instruction provided by PowerPoint slides, verbal discussion, and written materials to support subject matter. The instructor gives patients tips, tricks, and techniques to help them not only survive but enjoy the holidays despite the onslaught of food that  accompanies the holidays.   Knowledge Questionnaire Score:   Core Components/Risk Factors/Patient Goals at Admission:   Core Components/Risk Factors/Patient Goals Review:  Goals and Risk Factor Review    Row Name 12/18/18 1352 01/08/19 1437 06/11/19 0954         Core Components/Risk Factors/Patient Goals Review   Personal Goals Review  Improve shortness of breath with ADL's;Increase knowledge of respiratory medications and ability to use respiratory devices properly.;Develop more efficient breathing techniques such as purse lipped breathing and diaphragmatic breathing and practicing self-pacing with activity.  Improve shortness of breath with ADL's;Increase knowledge of respiratory medications and ability to use respiratory devices properly.;Develop more efficient breathing techniques such as purse lipped breathing and diaphragmatic breathing and practicing self-pacing with activity.  Increase knowledge of respiratory medications and ability to use respiratory devices properly.;Stress;Improve shortness of breath with ADL's;Develop more efficient breathing techniques such as purse lipped breathing and diaphragmatic breathing and practicing self-pacing with activity.     Review  Just started program, has attended 2 exercise sessions, too early to have met program goals.  Pt has attended 5 exercise sessions and 2 education sessions. Pt workloads are already showing progress with completion of 11-12 labs on the track, level 1 on the recumbent bike, and an increase to level 3 on the nustep. Rehab is currently closed and will plan to re-evaulate pt progress upon reopening.  Had 2 episodes on exercise session 06/07/2019 where her oxygen saturations decreased to 73-84%, she did not have her rescue inhaler with her and was sent home to do a nebulizer treatment.  While in program that day her oxygen level improved to 93% on 10L O2.  She improved after her nebulizer treatment.  She has been slow to progress  with her exercise d/t her emphysema and deconditioning.     Expected Outcomes  see admission outcomes  see admission goals and outcomes  See admission goals.        Core Components/Risk Factors/Patient Goals at Discharge (Final Review):  Goals and Risk Factor Review - 06/11/19 0954      Core Components/Risk Factors/Patient Goals Review   Personal Goals Review  Increase knowledge of respiratory medications and ability to use respiratory devices properly.;Stress;Improve shortness of breath with ADL's;Develop more efficient breathing techniques such as purse lipped breathing and diaphragmatic breathing and practicing self-pacing with activity.    Review  Had 2 episodes on exercise session 06/07/2019 where her oxygen saturations decreased to 73-84%, she did not have her rescue inhaler with her and was sent home to do a nebulizer treatment.  While in program that day her oxygen level improved to 93% on 10L O2.  She improved after her nebulizer treatment.  She has been slow to progress with her exercise d/t her emphysema and deconditioning.    Expected Outcomes  See admission goals.       ITP Comments:   Comments: ITP REVIEW Pt is making  expected progress toward pulmonary rehab goals after completing 12 sessions. Recommend continued exercise, life style modification, education, and utilization of breathing techniques to increase stamina and strength and decrease shortness of breath with exertion.

## 2019-06-12 NOTE — Progress Notes (Signed)
Daily Session Note  Patient Details  Name: Kayla Mack MRN: [id number] Date of Birth: 05/25/1946 Referring Provider:     Pulmonary Rehab Walk Test from 11/30/2018 in [place]  Referring Provider  Dr. Lamonte Sakai      Encounter Date: 06/12/2019  Check In: Session Check In - 06/12/19 [place]      Check-In   Supervising physician immediately available to respond to emergencies  [facility] immediately available    Physician(s)  Dr. Benny Lennert    Location  [facility]    Staff Present  Rosebud Poles, RN, Bjorn Loser, MS, Exercise Physiologist;Lisa Ysidro Evert, RN    Virtual Visit  No    Medication changes reported      No    Fall or balance concerns reported     No    Tobacco Cessation  No Change    Warm-up and Cool-down  Performed as group-led instruction    Resistance Training Performed  Yes    VAD Patient?  No    PAD/SET Patient?  No      Pain Assessment   Currently in Pain?  No/denies       Capillary Blood Glucose: No results found for this or any previous visit (from the past 24 hour(s)).  Exercise Prescription Changes - 06/12/19 1200      Response to Exercise   Blood Pressure (Admit)  116/70    Blood Pressure (Exercise)  136/84    Blood Pressure (Exit)  114/70    Heart Rate (Admit)  60 bpm    Heart Rate (Exercise)  95 bpm    Heart Rate (Exit)  69 bpm    Oxygen Saturation (Admit)  100 %    Oxygen Saturation (Exercise)  94 %    Oxygen Saturation (Exit)  96 %    Rating of Perceived Exertion (Exercise)  15    Perceived Dyspnea (Exercise)  3    Symptoms  more short of breath with exercise. Appt made with Dr. Lamonte Sakai    Duration  Continue with 30 min of aerobic exercise without signs/symptoms of physical distress.    Intensity  THRR unchanged      Resistance Training   Training Prescription  Yes    Weight  orange bands    Reps  10-15    Time  10 Minutes      Oxygen   Oxygen  Continuous    Liters  8   sats  have dropped past 2 visits, 10L at times     Recumbant Bike   Level  1.5    Watts  10    Minutes  15      NuStep   Level  2    SPM  80    Minutes  15    METs  2.8       Social History   Tobacco Use  Smoking Status Former Smoker  . Packs/day: 1.50  . Years: 40.00  . Pack years: 60.00  . Types: Cigarettes  . Quit date: 03/18/2017  . Years since quitting: 2.2  Smokeless Tobacco Never Used    Goals Met:  Exercise tolerated well  Goals Unmet:  Not Applicable  Comments: Service time is from 1015 to 1115    Dr. Rush Farmer is Medical Director for Pulmonary Rehab at [facility].

## 2019-06-13 DIAGNOSIS — F329 Major depressive disorder, single episode, unspecified: Secondary | ICD-10-CM | POA: Diagnosis not present

## 2019-06-13 DIAGNOSIS — F4323 Adjustment disorder with mixed anxiety and depressed mood: Secondary | ICD-10-CM | POA: Diagnosis not present

## 2019-06-13 DIAGNOSIS — Z961 Presence of intraocular lens: Secondary | ICD-10-CM | POA: Diagnosis not present

## 2019-06-14 ENCOUNTER — Other Ambulatory Visit: Payer: Self-pay

## 2019-06-14 ENCOUNTER — Encounter (HOSPITAL_COMMUNITY)
Admission: RE | Admit: 2019-06-14 | Discharge: 2019-06-14 | Disposition: A | Discharge status: Home / Self Care | Payer: Medicare Other | Source: Ambulatory Visit | Attending: Emergency Medicine | Admitting: Emergency Medicine

## 2019-06-14 DIAGNOSIS — J438 Other emphysema: Secondary | ICD-10-CM

## 2019-06-14 DIAGNOSIS — I272 Pulmonary hypertension, unspecified: Secondary | ICD-10-CM

## 2019-06-14 NOTE — Progress Notes (Signed)
Daily Session Note  Patient Details  Name: Kayla Mack MRN: [id number] Date of Birth: Mar 06, 1946 Referring Provider:     Pulmonary Rehab Walk Test from 11/30/2018 in [place]  Referring Provider  Dr. Lamonte Sakai      Encounter Date: 06/14/2019  Check In: Session Check In - 06/14/19 1018      Check-In   Supervising physician immediately available to respond to emergencies  [facility] immediately available    Physician(s)  Dr. Benny Lennert    Location  [facility]    Staff Present  Rosebud Poles, RN, Bjorn Loser, MS, Exercise Physiologist;Deziree Mokry Ysidro Evert, RN    Virtual Visit  No    Medication changes reported      No    Fall or balance concerns reported     No    Tobacco Cessation  No Change    Warm-up and Cool-down  Performed as group-led instruction    Resistance Training Performed  Yes    VAD Patient?  No    PAD/SET Patient?  No      Pain Assessment   Currently in Pain?  No/denies    Multiple Pain Sites  No       Capillary Blood Glucose: No results found for this or any previous visit (from the past 24 hour(s)).    Social History   Tobacco Use  Smoking Status Former Smoker  . Packs/day: 1.50  . Years: 40.00  . Pack years: 60.00  . Types: Cigarettes  . Quit date: 03/18/2017  . Years since quitting: 2.2  Smokeless Tobacco Never Used    Goals Met:  No report of cardiac concerns or symptoms Strength training completed today  Goals Unmet:  Not Applicable  Comments: Service time is from 1010 to 1110    Dr. Rush Farmer is Medical Director for Pulmonary Rehab at [facility].

## 2019-06-15 ENCOUNTER — Ambulatory Visit (INDEPENDENT_AMBULATORY_CARE_PROVIDER_SITE_OTHER): Payer: Medicare Other | Admitting: Emergency Medicine

## 2019-06-15 ENCOUNTER — Encounter: Payer: Self-pay | Admitting: Emergency Medicine

## 2019-06-15 DIAGNOSIS — J441 Chronic obstructive pulmonary disease with (acute) exacerbation: Secondary | ICD-10-CM

## 2019-06-15 NOTE — Progress Notes (Signed)
Virtual Visit via Video Note  I connected with Kayla Mack on 06/15/19 at 11:15 AM EDT by a video enabled telemedicine application and verified that I am speaking with the correct person using two identifiers.  Location: Patient: Home Provider: Office   I discussed the limitations of evaluation and management by telemedicine and the availability of in person appointments. The patient expressed understanding and agreed to proceed.  History of Present Illness: [name] is 75 with a history of COPD and chronic hypoxemic respiratory failure, associated secondary pulmonary hypertension.  We last spoke 1 month ago on the telephone visit   Observations/Objective: We changed her back to Trelegy once daily.  She uses DuoNeb as needed, approximately.  She has high oxygen needs, 4 to 6 L/min at rest and up to 10 L/min with exertion.  She is participating in pulmonary rehab maintenance program. She is having difficulty with increased dyspnea when using the recumbent bike - more so than when walking or other activity. No associated desats at those times. No wheeze. She may have gotten some benefit from doing DuoNeb prior to therapy. She may have had some improvement with this. She has also decreased the duration of this training.   She reports today that her breathing does sometimes limit her w walking the dog, going for the mail. She tries this sometimes without O2.   Assessment and Plan: COPD (chronic obstructive pulmonary disease) ([place]) Overall I believe she is stable on her current regimen.  She has been going to the pulmonary rehab maintenance program and I congratulated her on this.  She has a significant oxygen need I am very grateful to the nurses and staff at rehab for helping to manage her exercise routine.  She is quite limited especially when she uses the recumbent bike.  I tried to reassure her that there may still be room for her exertional tolerance to improve if she continues to  work on this exercise.  I hope that this will be the case.  She does have severe COPD, significant hypoxemia and it may be that at some point her maximal function plateaus.  She did seem to benefit from taking the DuoNeb prior to the workout and I encouraged her to do this.  For now will stay on Trelegy, her current oxygen, DuoNeb as her rescue medication.  I will see her in the office next month.    Follow Up Instructions: 07/10/2019    I discussed the assessment and treatment plan with the patient. The patient was provided an opportunity to ask questions and all were answered. The patient agreed with the plan and demonstrated an understanding of the instructions.   The patient was advised to call back or seek an in-person evaluation if the symptoms worsen or if the condition fails to improve as anticipated.  I provided 20 minutes of non-face-to-face time during this encounter.   Collene Gobble, MD

## 2019-06-15 NOTE — Assessment & Plan Note (Signed)
Overall I believe she is stable on her current regimen.  She has been going to the pulmonary rehab maintenance program and I congratulated her on this.  She has a significant oxygen need I am very grateful to the nurses and staff at rehab for helping to manage her exercise routine.  She is quite limited especially when she uses the recumbent bike.  I tried to reassure her that there may still be room for her exertional tolerance to improve if she continues to work on this exercise.  I hope that this will be the case.  She does have severe COPD, significant hypoxemia and it may be that at some point her maximal function plateaus.  She did seem to benefit from taking the DuoNeb prior to the workout and I encouraged her to do this.  For now will stay on Trelegy, her current oxygen, DuoNeb as her rescue medication.  I will see her in the office next month.

## 2019-06-19 ENCOUNTER — Other Ambulatory Visit: Payer: Self-pay

## 2019-06-19 ENCOUNTER — Encounter (HOSPITAL_COMMUNITY)
Admission: RE | Admit: 2019-06-19 | Discharge: 2019-06-19 | Disposition: A | Discharge status: Home / Self Care | Payer: Medicare Other | Source: Ambulatory Visit | Attending: Emergency Medicine | Admitting: Emergency Medicine

## 2019-06-19 DIAGNOSIS — J438 Other emphysema: Secondary | ICD-10-CM | POA: Diagnosis not present

## 2019-06-19 DIAGNOSIS — I272 Pulmonary hypertension, unspecified: Secondary | ICD-10-CM

## 2019-06-19 NOTE — Progress Notes (Signed)
I have reviewed the pulmonary graduate packet with Payton Doughty Gatchel . Catalina is scheduled to graduate on 06/21/2019. The patient was given an exercise prescription and stated that they wanted to continue exercise in cardiac/pulmonary rehab maintenance when we reopen those classes.  [name] and I discussed how to progress their exercise prescription.  The patient stated that they understand the exercise prescription.  We reviewed exercise guidelines, target heart rate during exercise, RPE Scale, weather conditions, NTG use, endpoints for exercise, warmup and cool down.  The patient was also given information regarding the pulmonary maintenance program and given homework to complete and return before graduation. Patient is encouraged to come to me with any questions.

## 2019-06-19 NOTE — Progress Notes (Signed)
Daily Session Note  Patient Details  Name: Kayla Mack MRN: [id number] Date of Birth: 24-Dec-1945 Referring Provider:     Pulmonary Rehab Walk Test from 11/30/2018 in [place]  Referring Provider  Dr. Lamonte Sakai      Encounter Date: 06/19/2019  Check In: Session Check In - 06/19/19 1109      Check-In   Supervising physician immediately available to respond to emergencies  [facility] immediately available    Physician(s)  Dr. Benny Lennert    Location  [facility]    Staff Present  Rosebud Poles, RN, BSN;Carlette Wilber Oliphant, RN, Bjorn Loser, MS, Exercise Physiologist    Virtual Visit  No    Medication changes reported      No    Fall or balance concerns reported     No    Tobacco Cessation  No Change    Warm-up and Cool-down  Performed as group-led instruction    Resistance Training Performed  Yes    VAD Patient?  No    PAD/SET Patient?  No      Pain Assessment   Currently in Pain?  No/denies    Multiple Pain Sites  No       Capillary Blood Glucose: No results found for this or any previous visit (from the past 24 hour(s)).    Social History   Tobacco Use  Smoking Status Former Smoker  . Packs/day: 1.50  . Years: 40.00  . Pack years: 60.00  . Types: Cigarettes  . Quit date: 03/18/2017  . Years since quitting: 2.2  Smokeless Tobacco Never Used    Goals Met:  Exercise tolerated well Personal goals reviewed  Goals Unmet:  Not Applicable  Comments: Service time is from 1015 to 1122    Dr. Rush Farmer is Medical Director for Pulmonary Rehab at [facility].

## 2019-06-21 ENCOUNTER — Other Ambulatory Visit: Payer: Self-pay

## 2019-06-21 ENCOUNTER — Encounter (HOSPITAL_COMMUNITY)
Admission: RE | Admit: 2019-06-21 | Discharge: 2019-06-21 | Disposition: A | Discharge status: Home / Self Care | Payer: Medicare Other | Source: Ambulatory Visit | Attending: Emergency Medicine | Admitting: Emergency Medicine

## 2019-06-21 DIAGNOSIS — J438 Other emphysema: Secondary | ICD-10-CM | POA: Diagnosis not present

## 2019-06-21 DIAGNOSIS — F329 Major depressive disorder, single episode, unspecified: Secondary | ICD-10-CM | POA: Diagnosis not present

## 2019-06-21 DIAGNOSIS — F4323 Adjustment disorder with mixed anxiety and depressed mood: Secondary | ICD-10-CM | POA: Diagnosis not present

## 2019-06-21 NOTE — Progress Notes (Signed)
Daily Session Note  Patient Details  Name: ROSHAWN LACINA MRN: [id number] Date of Birth: [birth year] Referring Provider:     Pulmonary Rehab Walk Test from 11/30/2018 in [place]  Referring Provider  Dr. Lamonte Sakai      Encounter Date: 06/21/2019  Check In: Session Check In - 06/21/19 1026      Check-In   Supervising physician immediately available to respond to emergencies  [facility] immediately available    Physician(s)  Dr. Benny Lennert    Location  [facility]    Staff Present  Rosebud Poles, RN, Bjorn Loser, MS, Exercise Physiologist;Lisa Ysidro Evert, RN    Virtual Visit  No    Medication changes reported      No    Fall or balance concerns reported     No    Tobacco Cessation  No Change    Warm-up and Cool-down  Performed as group-led instruction    Resistance Training Performed  Yes    VAD Patient?  No      Pain Assessment   Currently in Pain?  No/denies    Multiple Pain Sites  No       Capillary Blood Glucose: No results found for this or any previous visit (from the past 24 hour(s)).    Social History   Tobacco Use  Smoking Status Former Smoker  . Packs/day: 1.50  . Years: 40.00  . Pack years: 60.00  . Types: Cigarettes  . Quit date: 03/18/2017  . Years since quitting: 2.2  Smokeless Tobacco Never Used    Goals Met:  Proper associated with RPD/PD & O2 Sat Exercise tolerated well Strength training completed today  Goals Unmet:  Not Applicable  Comments: Service time is from 1015 to 1120    Dr. Rush Farmer is Medical Director for Pulmonary Rehab at [facility].

## 2019-06-23 IMAGING — DX DG CHEST 1V PORT
1 series · 1 of 1 positions shown · non-contrast
Comparison: Yesterday

CLINICAL DATA: Shortness of breath

EXAM:
PORTABLE CHEST 1 VIEW

[chest ap]
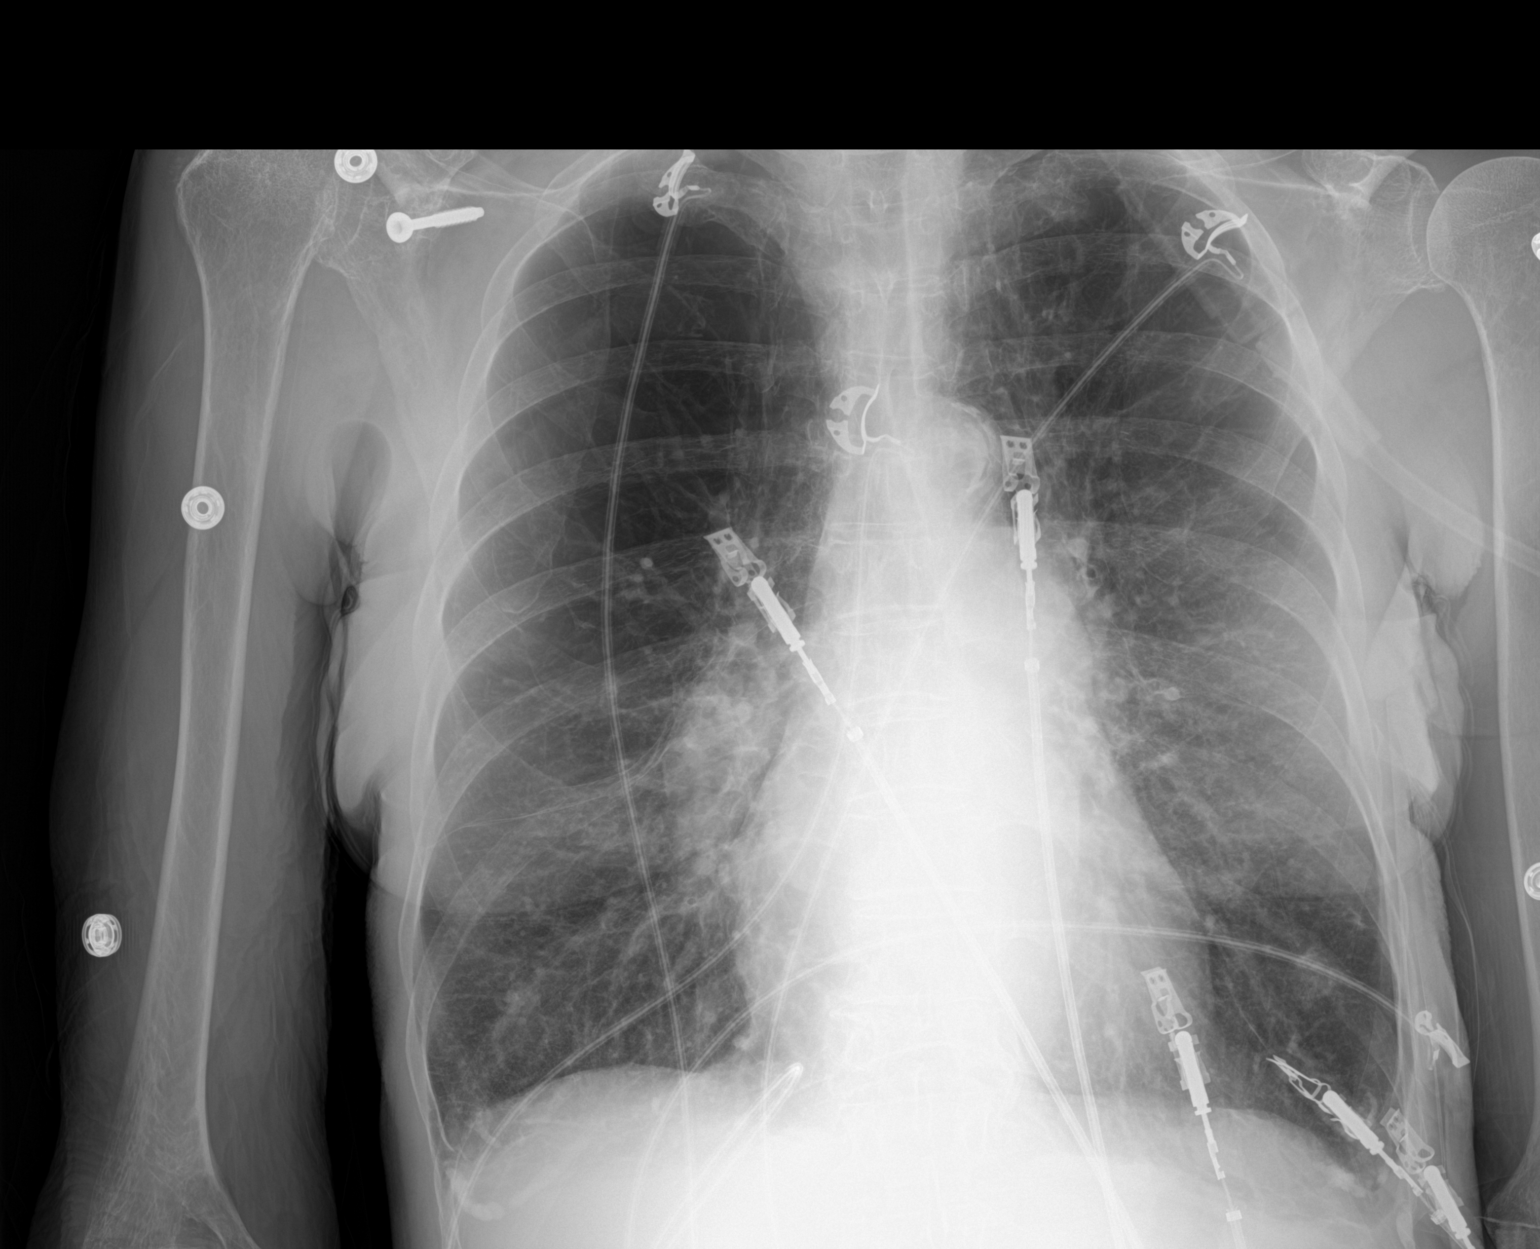

[1 of 1 positions shown; findings below may reference images not displayed]

FINDINGS: Normal heart size. Stable fullness of the hila, with branching
pattern suggesting pulmonary artery enlargement, stable. Main
pulmonary artery shadow is also enlarged, findings of pulmonary
hypertension. Advanced emphysema. Interstitial coarsening is at
baseline. There is no edema, consolidation, effusion, or
pneumothorax.
IMPRESSION: 1. Advanced emphysema without acute superimposed finding.
2. Suspect pulmonary hypertension.

## 2019-06-26 ENCOUNTER — Encounter (HOSPITAL_COMMUNITY): Payer: Medicare Other

## 2019-06-26 DIAGNOSIS — F329 Major depressive disorder, single episode, unspecified: Secondary | ICD-10-CM | POA: Diagnosis not present

## 2019-06-26 DIAGNOSIS — F4323 Adjustment disorder with mixed anxiety and depressed mood: Secondary | ICD-10-CM | POA: Diagnosis not present

## 2019-07-03 DIAGNOSIS — F329 Major depressive disorder, single episode, unspecified: Secondary | ICD-10-CM | POA: Diagnosis not present

## 2019-07-03 DIAGNOSIS — F4323 Adjustment disorder with mixed anxiety and depressed mood: Secondary | ICD-10-CM | POA: Diagnosis not present

## 2019-07-10 ENCOUNTER — Other Ambulatory Visit: Payer: Self-pay

## 2019-07-10 ENCOUNTER — Encounter: Payer: Self-pay | Admitting: Emergency Medicine

## 2019-07-10 ENCOUNTER — Ambulatory Visit (INDEPENDENT_AMBULATORY_CARE_PROVIDER_SITE_OTHER): Payer: Medicare Other | Admitting: Emergency Medicine

## 2019-07-10 DIAGNOSIS — J441 Chronic obstructive pulmonary disease with (acute) exacerbation: Secondary | ICD-10-CM | POA: Diagnosis not present

## 2019-07-10 DIAGNOSIS — Z23 Encounter for immunization: Secondary | ICD-10-CM

## 2019-07-10 DIAGNOSIS — J9611 Chronic respiratory failure with hypoxia: Secondary | ICD-10-CM

## 2019-07-10 NOTE — Assessment & Plan Note (Addendum)
Continue to wear your oxygen reliably at 4 to 10 L/min depending on your level of exertion. Continue trilogy ventilator device nightly

## 2019-07-10 NOTE — Progress Notes (Signed)
   Subjective:    Patient ID: Kayla Mack, female    DOB: [birth year], 73 y.o.   MRN: [id number]  HPI This is a follow-up visit for 73 year old woman with a history of very severe COPD and associated chronic hypoxemic and hypercapnic respiratory failure, secondary pulmonary hypertension.  She uses a trilogy ventilator at home.  She has been evaluated for her [place] at [facility], no targeted [place] therapy initiated thus far.  Her current bronchodilator regimen includes Trelegy, DuoNeb as needed - about few times a week.  She continues to participate in pulmonary rehab -- just completed and is considering the maintenance program. She uses oxygen at 4 to 10 L/min. No real wheeze or cough, no sputum.     Review of Systems     Objective:   Physical Exam Vitals:   07/10/19 1415  BP: 118/64  Pulse: 76  SpO2: 100%  Weight: 136 lb (61.7 kg)  Height: _0  (1.575 m)   Gen: Pleasant, elderly woman, in no distress,  normal affect, O2 in place  ENT: No lesions,  mouth clear,  oropharynx clear, no postnasal drip, strong voice  Neck: No JVD, no stridor  Lungs: No use of accessory muscles, no crackles or wheezing on normal respiration, she does wheeze on a forced expiration.   Cardiovascular: RRR, heart sounds normal, no murmur or gallops, no peripheral edema  Musculoskeletal: No deformities, no cyanosis or clubbing  Neuro: alert, awake, non focal  Skin: Warm, no lesions or rash      Assessment & Plan:  COPD (chronic obstructive pulmonary disease) ([place]) Please continue your Trelegy once daily as you have been taking it.  Rinse and gargle after using. Keep your DuoNeb available to use up to every 6 hours if needed for shortness of breath, chest tightness, wheezing. Congratulations on completing your pulmonary rehabilitation program.  We can talk in the future about starting the maintenance program. Flu shot today Telephone visit in 2 months or follow-up sooner if you develop any problems.   Chronic respiratory failure with hypoxia (HCC) Continue to wear your oxygen reliably at 4 to 10 L/min depending on your level of exertion. Continue trilogy ventilator device nightly  Baltazar Apo, MD, PhD 07/10/2019, 2:52 PM [facility] [phone] or if no answer [phone]

## 2019-07-10 NOTE — Patient Instructions (Addendum)
Please continue your Trelegy once daily as you have been taking it.  Rinse and gargle after using. Keep your DuoNeb available to use up to every 6 hours if needed for shortness of breath, chest tightness, wheezing. Continue to wear your oxygen reliably at 4 to 10 L/min depending on your level of exertion. Congratulations on completing your pulmonary rehabilitation program.  We can talk in the future about starting the maintenance program. Flu shot today Telephone visit in 2 months or follow-up sooner if you develop any problems.

## 2019-07-10 NOTE — Assessment & Plan Note (Signed)
Please continue your Trelegy once daily as you have been taking it.  Rinse and gargle after using. Keep your DuoNeb available to use up to every 6 hours if needed for shortness of breath, chest tightness, wheezing. Congratulations on completing your pulmonary rehabilitation program.  We can talk in the future about starting the maintenance program. Flu shot today Telephone visit in 2 months or follow-up sooner if you develop any problems.

## 2019-07-24 DIAGNOSIS — F329 Major depressive disorder, single episode, unspecified: Secondary | ICD-10-CM | POA: Diagnosis not present

## 2019-07-24 DIAGNOSIS — F4323 Adjustment disorder with mixed anxiety and depressed mood: Secondary | ICD-10-CM | POA: Diagnosis not present

## 2019-07-31 DIAGNOSIS — F329 Major depressive disorder, single episode, unspecified: Secondary | ICD-10-CM | POA: Diagnosis not present

## 2019-07-31 DIAGNOSIS — F4323 Adjustment disorder with mixed anxiety and depressed mood: Secondary | ICD-10-CM | POA: Diagnosis not present

## 2019-07-31 NOTE — Progress Notes (Signed)
Discharge Progress Report  Patient Details  Name: Kayla Mack MRN: [id number] Date of Birth: 1945/12/27 Referring Provider:     Pulmonary Rehab Walk Test from 11/30/2018 in [place]  Referring Provider  Dr. Lamonte Sakai       Number of Visits: 16  Reason for Discharge:  Patient reached a stable level of exercise. Patient independent in their exercise. Patient has met program and personal goals.  Smoking History:  Social History   Tobacco Use  Smoking Status Former Smoker  . Packs/day: 1.50  . Years: 40.00  . Pack years: 60.00  . Types: Cigarettes  . Quit date: 03/18/2017  . Years since quitting: 2.3  Smokeless Tobacco Never Used    Diagnosis:  Other emphysema ([place])  ADL UCSD: Pulmonary Assessment Scores    Row Name 06/21/19 1157 06/21/19 1505       ADL UCSD   ADL Phase  Exit  Exit    SOB Score total  -  72      CAT Score   CAT Score  -  27      mMRC Score   mMRC Score  3  -       Initial Exercise Prescription:   Discharge Exercise Prescription (Final Exercise Prescription Changes): Exercise Prescription Changes - 06/12/19 1200      Response to Exercise   Blood Pressure (Admit)  116/70    Blood Pressure (Exercise)  136/84    Blood Pressure (Exit)  114/70    Heart Rate (Admit)  60 bpm    Heart Rate (Exercise)  95 bpm    Heart Rate (Exit)  69 bpm    Oxygen Saturation (Admit)  100 %    Oxygen Saturation (Exercise)  94 %    Oxygen Saturation (Exit)  96 %    Rating of Perceived Exertion (Exercise)  15    Perceived Dyspnea (Exercise)  3    Symptoms  more short of breath with exercise. Appt made with Dr. Lamonte Sakai    Duration  Continue with 30 min of aerobic exercise without signs/symptoms of physical distress.    Intensity  THRR unchanged      Resistance Training   Training Prescription  Yes    Weight  orange bands    Reps  10-15    Time  10 Minutes      Oxygen   Oxygen  Continuous    Liters  8   sats have dropped  past 2 visits, 10L at times     Recumbant Bike   Level  1.5    Watts  10    Minutes  15      NuStep   Level  2    SPM  80    Minutes  15    METs  2.8       Functional Capacity: 6 Minute Walk    Row Name 06/21/19 1157         6 Minute Walk   Phase  Discharge     Distance  1255 feet     Distance Feet Change  -55 ft     Walk Time  6 minutes     # of Rest Breaks  0     MPH  2.38     METS  2.84     RPE  16     Perceived Dyspnea   3     VO2 Peak  10.1     Symptoms  Yes (  comment)     Comments  pushed wheelchair     Resting HR  70 bpm     Resting BP  104/62     Resting Oxygen Saturation   100 %     Exercise Oxygen Saturation  during 6 min walk  85 %     Max Ex. HR  117 bpm     Max Ex. BP  146/70     2 Minute Post BP  124/76       Interval HR   1 Minute HR  84     2 Minute HR  101     3 Minute HR  114     4 Minute HR  116     5 Minute HR  114     6 Minute HR  117     2 Minute Post HR  78     Interval Heart Rate?  Yes       Interval Oxygen   Interval Oxygen?  Yes     Baseline Oxygen Saturation %  100 %     1 Minute Oxygen Saturation %  94 %     1 Minute Liters of Oxygen  8 L     2 Minute Oxygen Saturation %  87 %     2 Minute Liters of Oxygen  8 L     3 Minute Oxygen Saturation %  85 %     3 Minute Liters of Oxygen  8 L     4 Minute Oxygen Saturation %  85 %     4 Minute Liters of Oxygen  8 L     5 Minute Oxygen Saturation %  87 %     5 Minute Liters of Oxygen  10 L     6 Minute Oxygen Saturation %  87 %     6 Minute Liters of Oxygen  10 L     2 Minute Post Oxygen Saturation %  100 %     2 Minute Post Liters of Oxygen  10 L        Psychological, QOL, Others - Outcomes: PHQ 2/9: Depression screen [facility] 2/9 11/27/2018 08/25/2018 10/25/2017 08/29/2017 07/27/2017  Decreased Interest 0 0 _0 Down, Depressed, Hopeless _1 PHQ - 2 Score _2 Altered sleeping 0 - - 0 0  Tired, decreased energy 2 - - 1 1  Change in appetite 0 - - 0 1  Feeling  bad or failure about yourself  1 - - 0 0  Trouble concentrating 0 - - 0 1  Moving slowly or fidgety/restless 0 - - 0 0  Suicidal thoughts 0 - - 0 0  PHQ-9 Score 5 - - 3 5  Difficult doing work/chores Somewhat difficult - - Somewhat difficult -    Quality of Life:   Personal Goals: Goals established at orientation with interventions provided to work toward goal.    Personal Goals Discharge: Goals and Risk Factor Review    Row Name 06/11/19 0954             Core Components/Risk Factors/Patient Goals Review   Personal Goals Review  Increase knowledge of respiratory medications and ability to use respiratory devices properly.;Stress;Improve shortness of breath with ADL's;Develop more efficient breathing techniques such as purse lipped breathing and diaphragmatic breathing and practicing self-pacing with activity.       Review  Had 2 episodes on exercise session  06/07/2019 where her oxygen saturations decreased to 73-84%, she did not have her rescue inhaler with her and was sent home to do a nebulizer treatment.  While in program that day her oxygen level improved to 93% on 10L O2.  She improved after her nebulizer treatment.  She has been slow to progress with her exercise d/t her emphysema and deconditioning.       Expected Outcomes  See admission goals.          Exercise Goals and Review:   Exercise Goals Re-Evaluation: Exercise Goals Re-Evaluation    Row Name 06/11/19 1011             Exercise Goal Re-Evaluation   Exercise Goals Review  Increase Physical Activity;Increase Strength and Stamina;Able to understand and use rate of perceived exertion (RPE) scale;Able to understand and use Dyspnea scale;Knowledge and understanding of Target Heart Rate Range (THRR);Understanding of Exercise Prescription       Comments  Pt has attended 7 exercise sessions since we have reopened. Pt is able to exercise at 2.7 METs on the stepper. Pt is accepting of workload increases and maintains a  positive attitude. Will continue to monitor and progress as able.       Expected Outcomes  Through exercise at rehab and at home, the patient will decrease shortness of breath with daily activities and feel confident in carrying out an exercise regime at home.          Nutrition & Weight - Outcomes:    Nutrition:   Nutrition Discharge:   Education Questionnaire Score: Knowledge Questionnaire Score - 06/21/19 1507      Knowledge Questionnaire Score   Post Score  16       Goals reviewed with patient; copy given to patient.

## 2019-07-31 NOTE — Addendum Note (Signed)
Encounter addended by: Lance Morin, RN on: 07/31/2019 4:34 PM  Actions taken: Clinical Note Signed, Episode resolved

## 2019-08-08 DIAGNOSIS — F4323 Adjustment disorder with mixed anxiety and depressed mood: Secondary | ICD-10-CM | POA: Diagnosis not present

## 2019-08-08 DIAGNOSIS — F329 Major depressive disorder, single episode, unspecified: Secondary | ICD-10-CM | POA: Diagnosis not present

## 2019-08-24 DIAGNOSIS — F329 Major depressive disorder, single episode, unspecified: Secondary | ICD-10-CM | POA: Diagnosis not present

## 2019-08-24 DIAGNOSIS — F4323 Adjustment disorder with mixed anxiety and depressed mood: Secondary | ICD-10-CM | POA: Diagnosis not present

## 2019-08-29 DIAGNOSIS — F329 Major depressive disorder, single episode, unspecified: Secondary | ICD-10-CM | POA: Diagnosis not present

## 2019-08-29 DIAGNOSIS — F4323 Adjustment disorder with mixed anxiety and depressed mood: Secondary | ICD-10-CM | POA: Diagnosis not present

## 2019-09-04 DIAGNOSIS — F329 Major depressive disorder, single episode, unspecified: Secondary | ICD-10-CM | POA: Diagnosis not present

## 2019-09-04 DIAGNOSIS — F4323 Adjustment disorder with mixed anxiety and depressed mood: Secondary | ICD-10-CM | POA: Diagnosis not present

## 2019-09-11 DIAGNOSIS — F4323 Adjustment disorder with mixed anxiety and depressed mood: Secondary | ICD-10-CM | POA: Diagnosis not present

## 2019-09-11 DIAGNOSIS — F329 Major depressive disorder, single episode, unspecified: Secondary | ICD-10-CM | POA: Diagnosis not present

## 2019-09-12 ENCOUNTER — Encounter: Payer: Self-pay | Admitting: Emergency Medicine

## 2019-09-12 ENCOUNTER — Other Ambulatory Visit: Payer: Self-pay

## 2019-09-12 ENCOUNTER — Ambulatory Visit (INDEPENDENT_AMBULATORY_CARE_PROVIDER_SITE_OTHER): Payer: Medicare Other | Admitting: Emergency Medicine

## 2019-09-12 DIAGNOSIS — I272 Pulmonary hypertension, unspecified: Secondary | ICD-10-CM | POA: Diagnosis not present

## 2019-09-12 DIAGNOSIS — J9602 Acute respiratory failure with hypercapnia: Secondary | ICD-10-CM

## 2019-09-12 DIAGNOSIS — J441 Chronic obstructive pulmonary disease with (acute) exacerbation: Secondary | ICD-10-CM | POA: Diagnosis not present

## 2019-09-12 DIAGNOSIS — J309 Allergic rhinitis, unspecified: Secondary | ICD-10-CM | POA: Insufficient documentation

## 2019-09-12 DIAGNOSIS — J301 Allergic rhinitis due to pollen: Secondary | ICD-10-CM

## 2019-09-12 NOTE — Progress Notes (Signed)
Virtual Visit via Telephone Note  I connected with Kayla Mack on 09/12/19 at 10:15 AM EST by telephone and verified that I am speaking with the correct person using two identifiers.  Location: Patient: Home Provider: Office   I discussed the limitations, risks, security and privacy concerns of performing an evaluation and management service by telephone and the availability of in person appointments. I also discussed with the patient that there may be a patient responsible charge related to this service. The patient expressed understanding and agreed to proceed.   History of Present Illness: 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  She uses a trilogy ventilator at home for nocturnal support.  She has also been evaluated for her secondary PAH at [facility], not currently on targeted [place] therapy.  Also with allergic rhinitis (off Flonase, loratadine, saline nasal spray)   Observations/Objective: Current medical regimen includes Trelegy, DuoNeb as needed, uses approximately few times a week.  She participated in pulmonary rehab completed in September. Oxygen uses 4 to 10 L/min, usually at 6, depending on her degree of exertion.  Assessment and Plan: She reports that she has been doing pretty well. Has not gone back to maintenance rehab - she is concerned about the potential exposure risk at this time. Maybe OK to do so next year. She is using her O2 reliably. Unfortunately her parents died since last time and this has been stressful, a lot of work as well. Uses her trilogy device fairly reliably, has been worried about problem with her water reservoir - just had it replaced.   Follow Up Instructions: COPD (chronic obstructive pulmonary disease) (HCC) Very severe COPD.  She does have some exertional limitation but is overall stable.  She has not gone back to the maintenance program for  pulmonary rehab due to concerns about exposure risk.  She would like to do so when the risk is more acceptable.  She also knows that she would benefit from coronavirus vaccination when this is possible.  Flu shot and pneumonia shot are both up-to-date.  We will continue her same medical regimen.  Continue trilogy ventilator support.  She just had her humidifier reservoir changed.  Pulmonary hypertension ([place]) Not on targeted therapy right now.  Continue her trilogy ventilator, continue submental oxygen  Respiratory failure ([place]) Trilogy ventilator at night.  She had been off of it for a while due to concerns about possible mold in her reservoir.  This has now been changed is going to get back on it.  Allergic rhinitis Currently off of her loratadine and fluticasone nasal spray.  We talked about restarting these if nasal symptoms evolve, particularly to avoid any possible triggers that would cause her COPD to flare.     I discussed the assessment and treatment plan with the patient. The patient was provided an opportunity to ask questions and all were answered. The patient agreed with the plan and demonstrated an understanding of the instructions.   The patient was advised to call back or seek an in-person evaluation if the symptoms worsen or if the condition fails to improve as anticipated.  I provided 10 minutes of non-face-to-face time during this encounter.   Collene Gobble, MD

## 2019-09-12 NOTE — Assessment & Plan Note (Signed)
Currently off of her loratadine and fluticasone nasal spray.  We talked about restarting these if nasal symptoms evolve, particularly to avoid any possible triggers that would cause her COPD to flare.

## 2019-09-12 NOTE — Assessment & Plan Note (Signed)
Trilogy ventilator at night.  She had been off of it for a while due to concerns about possible mold in her reservoir.  This has now been changed is going to get back on it.

## 2019-09-12 NOTE — Assessment & Plan Note (Signed)
Not on targeted therapy right now.  Continue her trilogy ventilator, continue submental oxygen

## 2019-09-12 NOTE — Assessment & Plan Note (Addendum)
Very severe COPD.  She does have some exertional limitation but is overall stable.  She has not gone back to the maintenance program for pulmonary rehab due to concerns about exposure risk.  She would like to do so when the risk is more acceptable.  She also knows that she would benefit from coronavirus vaccination when this is possible.  Flu shot and pneumonia shot are both up-to-date.  We will continue her same medical regimen.  Continue trilogy ventilator support.  She just had her humidifier reservoir changed.

## 2019-09-19 DIAGNOSIS — F329 Major depressive disorder, single episode, unspecified: Secondary | ICD-10-CM | POA: Diagnosis not present

## 2019-09-19 DIAGNOSIS — F4323 Adjustment disorder with mixed anxiety and depressed mood: Secondary | ICD-10-CM | POA: Diagnosis not present

## 2019-09-20 DIAGNOSIS — Z0000 Encounter for general adult medical examination without abnormal findings: Secondary | ICD-10-CM | POA: Diagnosis not present

## 2019-09-20 DIAGNOSIS — M810 Age-related osteoporosis without current pathological fracture: Secondary | ICD-10-CM | POA: Diagnosis not present

## 2019-09-20 DIAGNOSIS — E7800 Pure hypercholesterolemia, unspecified: Secondary | ICD-10-CM | POA: Diagnosis not present

## 2019-09-20 DIAGNOSIS — I10 Essential (primary) hypertension: Secondary | ICD-10-CM | POA: Diagnosis not present

## 2019-09-26 DIAGNOSIS — F4323 Adjustment disorder with mixed anxiety and depressed mood: Secondary | ICD-10-CM | POA: Diagnosis not present

## 2019-09-26 DIAGNOSIS — F329 Major depressive disorder, single episode, unspecified: Secondary | ICD-10-CM | POA: Diagnosis not present

## 2019-09-27 DIAGNOSIS — Z0000 Encounter for general adult medical examination without abnormal findings: Secondary | ICD-10-CM | POA: Diagnosis not present

## 2019-09-27 DIAGNOSIS — M810 Age-related osteoporosis without current pathological fracture: Secondary | ICD-10-CM | POA: Diagnosis not present

## 2019-09-27 DIAGNOSIS — R413 Other amnesia: Secondary | ICD-10-CM | POA: Diagnosis not present

## 2019-09-27 DIAGNOSIS — J449 Chronic obstructive pulmonary disease, unspecified: Secondary | ICD-10-CM | POA: Diagnosis not present

## 2019-09-27 DIAGNOSIS — I10 Essential (primary) hypertension: Secondary | ICD-10-CM | POA: Diagnosis not present

## 2019-09-27 DIAGNOSIS — Z9981 Dependence on supplemental oxygen: Secondary | ICD-10-CM | POA: Diagnosis not present

## 2019-09-27 DIAGNOSIS — Z9989 Dependence on other enabling machines and devices: Secondary | ICD-10-CM | POA: Diagnosis not present

## 2019-10-31 DIAGNOSIS — F329 Major depressive disorder, single episode, unspecified: Secondary | ICD-10-CM | POA: Diagnosis not present

## 2019-10-31 DIAGNOSIS — F4323 Adjustment disorder with mixed anxiety and depressed mood: Secondary | ICD-10-CM | POA: Diagnosis not present

## 2019-11-07 DIAGNOSIS — F329 Major depressive disorder, single episode, unspecified: Secondary | ICD-10-CM | POA: Diagnosis not present

## 2019-11-07 DIAGNOSIS — F4323 Adjustment disorder with mixed anxiety and depressed mood: Secondary | ICD-10-CM | POA: Diagnosis not present

## 2019-11-13 ENCOUNTER — Ambulatory Visit (INDEPENDENT_AMBULATORY_CARE_PROVIDER_SITE_OTHER): Payer: Medicare Other | Admitting: Emergency Medicine

## 2019-11-13 ENCOUNTER — Other Ambulatory Visit: Payer: Self-pay

## 2019-11-13 ENCOUNTER — Encounter: Payer: Self-pay | Admitting: Emergency Medicine

## 2019-11-13 DIAGNOSIS — J301 Allergic rhinitis due to pollen: Secondary | ICD-10-CM | POA: Diagnosis not present

## 2019-11-13 DIAGNOSIS — J441 Chronic obstructive pulmonary disease with (acute) exacerbation: Secondary | ICD-10-CM

## 2019-11-13 DIAGNOSIS — I272 Pulmonary hypertension, unspecified: Secondary | ICD-10-CM

## 2019-11-13 DIAGNOSIS — J9611 Chronic respiratory failure with hypoxia: Secondary | ICD-10-CM

## 2019-11-13 MED ORDER — FLUTICASONE PROPIONATE 50 MCG/ACT NA SUSP: 2.0000 | Freq: Every day | NASAL | 1 refills | Status: AC

## 2019-11-13 NOTE — Assessment & Plan Note (Signed)
Continue 6 to 10 L/min depending on level of exertion.  Continue trilogy ventilator every night.

## 2019-11-13 NOTE — Assessment & Plan Note (Addendum)
She was having escalating symptoms so she went back on her loratadine about 2 weeks ago.  She is using fluticasone nasal spray intermittently as well.  She has had some mild epistaxis with this, recommended that she might try changing to q. OD.  She needs a refill of the fluticasone.

## 2019-11-13 NOTE — Progress Notes (Signed)
Virtual Visit via Telephone Note  I connected with Kayla Mack on 11/13/19 at  9:00 AM EST by telephone and verified that I am speaking with the correct person using two identifiers.  Location: Patient: Home Provider: Office   I discussed the limitations, risks, security and privacy concerns of performing an evaluation and management service by telephone and the availability of in person appointments. I also discussed with the patient that there may be a patient responsible charge related to this service. The patient expressed understanding and agreed to proceed.   History of Present Illness: Follow-up visit for 74 year old woman with very severe COPD, combined chronic respiratory failure and associated secondary pulmonary hypertension.  She uses NIPPV at night and has also been evaluated for her secondary PAH at [facility], not currently on targeted medication.  Her standard oxygen need is 6 L/min, uses up to 10 L/min with exertion.   Observations/Objective: Currently managed on Trelegy, DuoNeb as needed, oxygen as above. Rare albuterol use - about 2 times since last visit.  Currently off her allergy regimen. She reports stable breathing - she is having stable exertional dyspnea, significant rhinorrhea when she is outside in the cold. She is back on fluticasone NS over the last week, using prn. She is also back on loratadine.  Having some sneezing.  She was scheduled for her COVID shot, was cancelled by [facility] due to supply chain  Assessment and Plan: COPD (chronic obstructive pulmonary disease) (HCC) Severe disease but currently stable on her regimen of Trelegy, albuterol as needed.  She has not used DuoNeb recently but she knows that she has it available if needed.  Supplemental oxygen as below.  Continue trilogy ventilator support every night.  She has good compliance and good benefit.  Chronic respiratory failure with hypoxia (HCC) Continue 6 to 10 L/min depending on level of exertion.   Continue trilogy ventilator every night.  Pulmonary hypertension ([place]) Secondary pulmonary hypertension, not on targeted medications.  Continue submental oxygen and management of her underlying COPD.  Allergic rhinitis She was having escalating symptoms so she went back on her loratadine about 2 weeks ago.  She is using fluticasone nasal spray intermittently as well.  She has had some mild epistaxis with this, recommended that she might try changing to q. OD.  She needs a refill of the fluticasone.     Follow Up Instructions: 4 months or prn   I discussed the assessment and treatment plan with the patient. The patient was provided an opportunity to ask questions and all were answered. The patient agreed with the plan and demonstrated an understanding of the instructions.   The patient was advised to call back or seek an in-person evaluation if the symptoms worsen or if the condition fails to improve as anticipated.  I provided 16 minutes of non-face-to-face time during this encounter.   Collene Gobble, MD

## 2019-11-13 NOTE — Addendum Note (Signed)
Addended by: Valerie Salts on: 11/13/2019 10:04 AM   Modules accepted: Orders

## 2019-11-13 NOTE — Assessment & Plan Note (Signed)
Secondary pulmonary hypertension, not on targeted medications.  Continue submental oxygen and management of her underlying COPD.

## 2019-11-13 NOTE — Assessment & Plan Note (Addendum)
Severe disease but currently stable on her regimen of Trelegy, albuterol as needed.  She has not used DuoNeb recently but she knows that she has it available if needed.  Supplemental oxygen as below.  Continue trilogy ventilator support every night.  She has good compliance and good benefit.  She was scheduled to get the COVID-19 vaccine, had to cancel due to supply chain through [facility].  She is going to work on reestablishing an appointment to receive.  If she has difficulty she will call us and we will try to assist.

## 2019-11-14 DIAGNOSIS — F329 Major depressive disorder, single episode, unspecified: Secondary | ICD-10-CM | POA: Diagnosis not present

## 2019-11-14 DIAGNOSIS — F4323 Adjustment disorder with mixed anxiety and depressed mood: Secondary | ICD-10-CM | POA: Diagnosis not present

## 2019-11-21 DIAGNOSIS — F329 Major depressive disorder, single episode, unspecified: Secondary | ICD-10-CM | POA: Diagnosis not present

## 2019-11-21 DIAGNOSIS — F4323 Adjustment disorder with mixed anxiety and depressed mood: Secondary | ICD-10-CM | POA: Diagnosis not present

## 2019-11-25 ENCOUNTER — Ambulatory Visit: Payer: Self-pay | Attending: Internal Medicine

## 2019-11-28 DIAGNOSIS — F329 Major depressive disorder, single episode, unspecified: Secondary | ICD-10-CM | POA: Diagnosis not present

## 2019-11-28 DIAGNOSIS — F4323 Adjustment disorder with mixed anxiety and depressed mood: Secondary | ICD-10-CM | POA: Diagnosis not present

## 2019-12-10 ENCOUNTER — Ambulatory Visit: Payer: Self-pay

## 2019-12-12 DIAGNOSIS — F4323 Adjustment disorder with mixed anxiety and depressed mood: Secondary | ICD-10-CM | POA: Diagnosis not present

## 2019-12-12 DIAGNOSIS — F329 Major depressive disorder, single episode, unspecified: Secondary | ICD-10-CM | POA: Diagnosis not present

## 2019-12-14 DIAGNOSIS — Z1231 Encounter for screening mammogram for malignant neoplasm of breast: Secondary | ICD-10-CM | POA: Diagnosis not present

## 2019-12-25 ENCOUNTER — Telehealth (HOSPITAL_COMMUNITY): Payer: Self-pay | Admitting: *Deleted

## 2019-12-26 DIAGNOSIS — F4323 Adjustment disorder with mixed anxiety and depressed mood: Secondary | ICD-10-CM | POA: Diagnosis not present

## 2019-12-26 DIAGNOSIS — F329 Major depressive disorder, single episode, unspecified: Secondary | ICD-10-CM | POA: Diagnosis not present

## 2019-12-31 DIAGNOSIS — F329 Major depressive disorder, single episode, unspecified: Secondary | ICD-10-CM | POA: Diagnosis not present

## 2019-12-31 DIAGNOSIS — F419 Anxiety disorder, unspecified: Secondary | ICD-10-CM | POA: Diagnosis not present

## 2019-12-31 DIAGNOSIS — Z9989 Dependence on other enabling machines and devices: Secondary | ICD-10-CM | POA: Diagnosis not present

## 2019-12-31 DIAGNOSIS — Z9981 Dependence on supplemental oxygen: Secondary | ICD-10-CM | POA: Diagnosis not present

## 2019-12-31 DIAGNOSIS — E7800 Pure hypercholesterolemia, unspecified: Secondary | ICD-10-CM | POA: Diagnosis not present

## 2019-12-31 DIAGNOSIS — I10 Essential (primary) hypertension: Secondary | ICD-10-CM | POA: Diagnosis not present

## 2019-12-31 DIAGNOSIS — J449 Chronic obstructive pulmonary disease, unspecified: Secondary | ICD-10-CM | POA: Diagnosis not present

## 2019-12-31 DIAGNOSIS — R413 Other amnesia: Secondary | ICD-10-CM | POA: Diagnosis not present

## 2020-01-02 DIAGNOSIS — F329 Major depressive disorder, single episode, unspecified: Secondary | ICD-10-CM | POA: Diagnosis not present

## 2020-01-02 DIAGNOSIS — F4323 Adjustment disorder with mixed anxiety and depressed mood: Secondary | ICD-10-CM | POA: Diagnosis not present

## 2020-01-04 DIAGNOSIS — F329 Major depressive disorder, single episode, unspecified: Secondary | ICD-10-CM | POA: Diagnosis not present

## 2020-01-04 DIAGNOSIS — F4323 Adjustment disorder with mixed anxiety and depressed mood: Secondary | ICD-10-CM | POA: Diagnosis not present

## 2020-01-07 ENCOUNTER — Other Ambulatory Visit: Payer: Self-pay | Admitting: Gastroenterology

## 2020-01-07 DIAGNOSIS — K5900 Constipation, unspecified: Secondary | ICD-10-CM | POA: Diagnosis not present

## 2020-01-07 DIAGNOSIS — Z86010 Personal history of colonic polyps: Secondary | ICD-10-CM | POA: Diagnosis not present

## 2020-01-07 DIAGNOSIS — J449 Chronic obstructive pulmonary disease, unspecified: Secondary | ICD-10-CM | POA: Diagnosis not present

## 2020-01-07 DIAGNOSIS — I2510 Atherosclerotic heart disease of native coronary artery without angina pectoris: Secondary | ICD-10-CM | POA: Diagnosis not present

## 2020-01-07 DIAGNOSIS — R197 Diarrhea, unspecified: Secondary | ICD-10-CM | POA: Diagnosis not present

## 2020-01-10 DIAGNOSIS — F4323 Adjustment disorder with mixed anxiety and depressed mood: Secondary | ICD-10-CM | POA: Diagnosis not present

## 2020-01-10 DIAGNOSIS — F329 Major depressive disorder, single episode, unspecified: Secondary | ICD-10-CM | POA: Diagnosis not present

## 2020-01-15 NOTE — Progress Notes (Addendum)
Anesthesia Review:  PCP: Cardiologist : Chest x-ray :2019 and Ct angio chest - 2019-epic  EKG : 2019-epic  Echo :2019 - epic  Cardiac Cath :  Sleep Study/ CPAP : Fasting Blood Sugar :      / Checks Blood Sugar -- times a day:   Blood Thinner/ Instructions /Last Dose: ASA / Instructions/ Last Dose :   Patient denies shortness of breath, chest pain, fever, and cough at this phone interview. COPD and Pulmonary Hypertension  Patient on 6L of oxygen at rest and 10L with activity.NIPPV at [facility] on own per patient.  Followed by Dr Malvin Johns- LOV-11/13/19 Scheduled for colonoscopy on 01/25/20.  Patient on Plavix does not yet have instructions from office of Dr Benson Norway to follow up with office regardin gPlavix instructions

## 2020-01-16 DIAGNOSIS — F329 Major depressive disorder, single episode, unspecified: Secondary | ICD-10-CM | POA: Diagnosis not present

## 2020-01-16 DIAGNOSIS — F4323 Adjustment disorder with mixed anxiety and depressed mood: Secondary | ICD-10-CM | POA: Diagnosis not present

## 2020-01-17 ENCOUNTER — Telehealth: Payer: Self-pay | Admitting: Emergency Medicine

## 2020-01-17 MED ORDER — ALBUTEROL SULFATE HFA 108 (90 BASE) MCG/ACT IN AERS: 2.0000 | INHALATION_SPRAY | Freq: Four times a day (QID) | RESPIRATORY_TRACT | 5 refills | Status: AC | PRN

## 2020-01-17 NOTE — Telephone Encounter (Signed)
That's ultimately up to the gastroenterologist, but I would say yes she should stop 5 days prior. That would be safest approach in event that GI needed to do biopsies, etc.

## 2020-01-17 NOTE — Telephone Encounter (Signed)
Spoke with pt. She is needing a prescription for Ventolin. This was given to her while in the hospital and she has almost run out. Rx has been sent in. Nothing further was needed.

## 2020-01-17 NOTE — Telephone Encounter (Signed)
Spoke with pt. She is aware of RB's response. Nothing further needed.

## 2020-01-17 NOTE — Telephone Encounter (Signed)
Spoke with pt. States that she has an upcoming colonoscopy and needs to know if she needs to stop Plavix beforehand.  RB - please advise. Thanks.

## 2020-01-22 ENCOUNTER — Other Ambulatory Visit (HOSPITAL_COMMUNITY)
Admission: RE | Admit: 2020-01-22 | Discharge: 2020-01-22 | Disposition: A | Discharge status: Home / Self Care | Payer: Medicare Other | Source: Ambulatory Visit | Attending: Gastroenterology | Admitting: Gastroenterology

## 2020-01-22 DIAGNOSIS — Z01812 Encounter for preprocedural laboratory examination: Secondary | ICD-10-CM | POA: Diagnosis not present

## 2020-01-22 DIAGNOSIS — Z20822 Contact with and (suspected) exposure to covid-19: Secondary | ICD-10-CM | POA: Insufficient documentation

## 2020-01-22 LAB — SARS CORONAVIRUS 2 (TAT 6-24 HRS): SARS Coronavirus 2: NEGATIVE

## 2020-01-23 DIAGNOSIS — F329 Major depressive disorder, single episode, unspecified: Secondary | ICD-10-CM | POA: Diagnosis not present

## 2020-01-23 DIAGNOSIS — F4323 Adjustment disorder with mixed anxiety and depressed mood: Secondary | ICD-10-CM | POA: Diagnosis not present

## 2020-01-25 ENCOUNTER — Other Ambulatory Visit: Payer: Self-pay

## 2020-01-25 ENCOUNTER — Ambulatory Visit (HOSPITAL_COMMUNITY)
Admission: RE | Admit: 2020-01-25 | Discharge: 2020-01-25 | Disposition: A | Discharge status: Home / Self Care | Payer: Medicare Other | Attending: Gastroenterology | Admitting: Gastroenterology

## 2020-01-25 ENCOUNTER — Ambulatory Visit (HOSPITAL_COMMUNITY): Payer: Medicare Other | Admitting: Anesthesiology

## 2020-01-25 ENCOUNTER — Encounter (HOSPITAL_COMMUNITY): Payer: Self-pay | Admitting: Gastroenterology

## 2020-01-25 ENCOUNTER — Encounter (HOSPITAL_COMMUNITY)
Admission: RE | Disposition: A | Discharge status: Home / Self Care | Payer: Self-pay | Source: Home / Self Care | Attending: Gastroenterology

## 2020-01-25 DIAGNOSIS — I10 Essential (primary) hypertension: Secondary | ICD-10-CM | POA: Diagnosis not present

## 2020-01-25 DIAGNOSIS — K5520 Angiodysplasia of colon without hemorrhage: Secondary | ICD-10-CM | POA: Diagnosis not present

## 2020-01-25 DIAGNOSIS — Z1211 Encounter for screening for malignant neoplasm of colon: Secondary | ICD-10-CM | POA: Diagnosis not present

## 2020-01-25 DIAGNOSIS — J449 Chronic obstructive pulmonary disease, unspecified: Secondary | ICD-10-CM | POA: Diagnosis not present

## 2020-01-25 DIAGNOSIS — Z7902 Long term (current) use of antithrombotics/antiplatelets: Secondary | ICD-10-CM | POA: Diagnosis not present

## 2020-01-25 DIAGNOSIS — Z8249 Family history of ischemic heart disease and other diseases of the circulatory system: Secondary | ICD-10-CM | POA: Insufficient documentation

## 2020-01-25 DIAGNOSIS — Z87891 Personal history of nicotine dependence: Secondary | ICD-10-CM | POA: Diagnosis not present

## 2020-01-25 DIAGNOSIS — K5730 Diverticulosis of large intestine without perforation or abscess without bleeding: Secondary | ICD-10-CM | POA: Insufficient documentation

## 2020-01-25 DIAGNOSIS — Z86010 Personal history of colonic polyps: Secondary | ICD-10-CM | POA: Diagnosis not present

## 2020-01-25 DIAGNOSIS — I2510 Atherosclerotic heart disease of native coronary artery without angina pectoris: Secondary | ICD-10-CM | POA: Diagnosis not present

## 2020-01-25 DIAGNOSIS — Z880 Allergy status to penicillin: Secondary | ICD-10-CM | POA: Diagnosis not present

## 2020-01-25 DIAGNOSIS — Z9981 Dependence on supplemental oxygen: Secondary | ICD-10-CM | POA: Insufficient documentation

## 2020-01-25 DIAGNOSIS — E785 Hyperlipidemia, unspecified: Secondary | ICD-10-CM | POA: Insufficient documentation

## 2020-01-25 DIAGNOSIS — Z886 Allergy status to analgesic agent status: Secondary | ICD-10-CM | POA: Diagnosis not present

## 2020-01-25 DIAGNOSIS — I272 Pulmonary hypertension, unspecified: Secondary | ICD-10-CM | POA: Diagnosis not present

## 2020-01-25 HISTORY — PX: COLONOSCOPY WITH PROPOFOL: SHX5780

## 2020-01-25 SURGERY — COLONOSCOPY WITH PROPOFOL
Anesthesia: Monitor Anesthesia Care

## 2020-01-25 MED ORDER — ONDANSETRON HCL 4 MG/2ML IJ SOLN
INTRAMUSCULAR | Status: DC | PRN
  Administered 2020-01-25: 4 mg via INTRAVENOUS

## 2020-01-25 MED ORDER — LACTATED RINGERS IV SOLN
INTRAVENOUS | Status: AC | PRN
  Administered 2020-01-25: 1000 mL via INTRAVENOUS

## 2020-01-25 MED ORDER — LIDOCAINE 2% (20 MG/ML) 5 ML SYRINGE
INTRAMUSCULAR | Status: DC | PRN
  Administered 2020-01-25: 10 mg via INTRAVENOUS
  Administered 2020-01-25: 30 mg via INTRAVENOUS

## 2020-01-25 MED ORDER — SODIUM CHLORIDE 0.9 % IV SOLN: INTRAVENOUS | Status: DC

## 2020-01-25 MED ORDER — PROPOFOL 500 MG/50ML IV EMUL
INTRAVENOUS | Status: DC | PRN
  Administered 2020-01-25: 100 ug/kg/min via INTRAVENOUS

## 2020-01-25 MED ORDER — PROPOFOL 1000 MG/100ML IV EMUL
INTRAVENOUS | Status: AC
  Filled 2020-01-25: qty 100

## 2020-01-25 SURGICAL SUPPLY — 22 items

## 2020-01-25 NOTE — Transfer of Care (Signed)
Immediate Anesthesia Transfer of Care Note  Patient: Kayla Mack  Procedure(s) Performed: COLONOSCOPY WITH PROPOFOL (N/A )  Patient Location: PACU  Anesthesia Type:MAC  Level of Consciousness: sedated  Airway & Oxygen Therapy: Patient Spontanous Breathing and Patient connected to face mask oxygen  Post-op Assessment: Report given to RN and Post -op Vital signs reviewed and stable  Post vital signs: Reviewed and stable  Last Vitals:  Vitals Value Taken Time  BP    Temp    Pulse    Resp    SpO2      Last Pain:  Vitals:   01/25/20 0955  TempSrc: Oral  PainSc: 2          Complications: No apparent anesthesia complications

## 2020-01-25 NOTE — Anesthesia Preprocedure Evaluation (Addendum)
Anesthesia Evaluation  Patient identified by MRN, date of birth, ID band Patient awake    Reviewed: Allergy & Precautions, H&P , NPO status , Patient's Chart, lab work & pertinent test results, reviewed documented beta blocker date and time   Airway Mallampati: I  TM Distance: >3 FB Neck ROM: full    Dental no notable dental hx. (+) Edentulous Upper, Edentulous Lower, Upper Dentures, Lower Dentures   Pulmonary COPD,  COPD inhaler and oxygen dependent, former smoker,  Home O2 @ 6L with sats 88-90%   Pulmonary exam normal breath sounds clear to auscultation       Cardiovascular Exercise Tolerance: Good hypertension, negative cardio ROS   Rhythm:regular Rate:Normal     Neuro/Psych PSYCHIATRIC DISORDERS Anxiety Depression negative neurological ROS     GI/Hepatic Neg liver ROS, GERD  Medicated,  Endo/Other  negative endocrine ROS  Renal/GU negative Renal ROS  negative genitourinary   Musculoskeletal   Abdominal   Peds  Hematology negative hematology ROS (+)   Anesthesia Other Findings   Reproductive/Obstetrics negative OB ROS                           Anesthesia Physical Anesthesia Plan  ASA: III  Anesthesia Plan: MAC   Post-op Pain Management:    Induction:   PONV Risk Score and Plan: 2  Airway Management Planned: Nasal Cannula, Simple Face Mask and Mask  Additional Equipment:   Intra-op Plan:   Post-operative Plan:   Informed Consent: I have reviewed the patients History and Physical, chart, labs and discussed the procedure including the risks, benefits and alternatives for the proposed anesthesia with the patient or authorized representative who has indicated his/her understanding and acceptance.     Dental Advisory Given  Plan Discussed with: CRNA and Anesthesiologist  Anesthesia Plan Comments:        Anesthesia Quick Evaluation

## 2020-01-25 NOTE — H&P (Signed)
  Kayla Mack HPI: Her colonoscopy on 12/23/2016 was positive for 3 adenomas. Two years ago she was started on home oxygen for her COPD/emphysema. Currently she has a flow rate of 6-10 liters per minute. The patient also takes Plavix for her CAD. The patient denies any problems with chest pain. There are no reports of hematochezia, melena, or abdominal pain. Intermittently she can have diarrhea and constipation.    Past Medical History:  Diagnosis Date  . Anxiety   . COPD (chronic obstructive pulmonary disease) ([place])   . Depression   . Hyperlipidemia   . Hypertension     Past Surgical History:  Procedure Laterality Date  . APPENDECTOMY    . RIGHT/LEFT HEART CATH AND CORONARY ANGIOGRAPHY N/A 05/09/2017   Procedure: Right/Left Heart Cath and Coronary Angiography;  Surgeon: [name], MD;  Location: [place] [facility];  Service: Cardiovascular;  Laterality: N/A;  . SHOULDER SURGERY Right Pin  . TONSILLECTOMY      Family History  Problem Relation Age of Onset  . Heart disease Mother   . Heart failure Mother   . Heart disease Father        cabg  . Heart disease Sister   . Diabetes Sister     Social History:  reports that she quit smoking about 2 years ago. Her smoking use included cigarettes. She has a 60.00 pack-year smoking history. She has never used smokeless tobacco. She reports current alcohol use. She reports that she does not use drugs.  Allergies:  Allergies  Allergen Reactions  . Aleve [Naproxen Sodium] Hives  . Aspirin Swelling    Facial swelling  . Penicillins Hives and Swelling    Has patient had a PCN reaction causing immediate rash, facial/tongue/throat swelling, SOB or lightheadedness with hypotension: Yes Has patient had a PCN reaction causing severe rash involving mucus membranes or skin necrosis: Yes Has patient had a PCN reaction that required hospitalization: Unk Has patient had a PCN reaction occurring within the last 10 years: No If all  of the above answers are "NO", then may proceed with Cephalosporin use.     Medications:  Scheduled:  Continuous: . sodium chloride      No results found for this or any previous visit (from the past 24 hour(s)).   No results found.  ROS:  As stated above in the HPI otherwise negative.  Blood pressure (!) 160/92, pulse 68, temperature 98.9 F (37.2 C), temperature source Oral, resp. rate 17, height 5' 1" (1.549 m), weight 61.2 kg, SpO2 100 %.    PE: Gen: NAD, Alert and Oriented HEENT:  Home/AT, EOMI Neck: Supple, no LAD Lungs: CTA Bilaterally CV: RRR without M/G/R ABM: Soft, NTND, +BS Ext: No C/C/E  Assessment/Plan: 1) Personal history of polyps - colonoscopy.  Kayla Mack D 01/25/2020, 10:03 AM

## 2020-01-25 NOTE — Op Note (Signed)
[facility] Patient Name: Kayla Mack Procedure Date: 01/25/2020 MRN: [id number] Attending MD: Carol Ada , MD Date of Birth: 05/01/1946 CSN: [id number] Age: 74 Admit Type: Outpatient Procedure:                Colonoscopy Indications:              High risk colon cancer surveillance: Personal                            history of colonic polyps Providers:                Carol Ada, MD, Glori Bickers, RN, Laverda Sorenson,                            Technician, Virgia Land, CRNA Referring MD:              Medicines:                 Complications:            No immediate complications. Estimated Blood Loss:     Estimated blood loss: none. Procedure:                Pre-Anesthesia Assessment:                           - Prior to the procedure, a History and Physical                            was performed, and patient medications and                            allergies were reviewed. The patient's tolerance of                            previous anesthesia was also reviewed. The risks                            and benefits of the procedure and the sedation                            options and risks were discussed with the patient.                            All questions were answered, and informed consent                            was obtained. Prior Anticoagulants: The patient has                            taken Plavix (clopidogrel), last dose was 5 days                            prior to procedure. ASA Grade Assessment: III - A  patient with severe systemic disease. After                            reviewing the risks and benefits, the patient was                            deemed in satisfactory condition to undergo the                            procedure.                           - Sedation was administered by an anesthesia                            professional. Deep sedation was attained.                           After  obtaining informed consent, the colonoscope                            was passed under direct vision. Throughout the                            procedure, the patient's blood pressure, pulse, and                            oxygen saturations were monitored continuously. The                            PCF-H190DL ([id number]) Olympus pediatric colonscope                            was introduced through the anus and advanced to the                            the cecum, identified by appendiceal orifice and                            ileocecal valve. The colonoscopy was performed                            without difficulty. The patient tolerated the                            procedure well. The quality of the bowel                            preparation was excellent. The ileocecal valve,                            appendiceal orifice, and rectum were photographed. Scope In: 10:52:49 AM Scope Out: 11:09:43 AM Scope Withdrawal Time: 0 hours 11 minutes 1 second  Total Procedure Duration: 0 hours 16 minutes 54 seconds  Findings:  Scattered small and large-mouthed diverticula were found in the sigmoid       colon.      A single large localized angiodysplastic lesion without bleeding was       found in the ascending colon. Impression:               - Diverticulosis in the sigmoid colon.                           - A single non-bleeding colonic angiodysplastic                            lesion.                           - No specimens collected. Moderate Sedation:      Not Applicable - Patient had care per Anesthesia. Recommendation:           - Patient has a contact number available for                            emergencies. The signs and symptoms of potential                            delayed complications were discussed with the                            patient. Return to normal activities tomorrow.                            Written discharge instructions were provided to the                             patient.                           - Resume previous diet.                           - Continue present medications.                           - Resume Plavix.                           - Repeat colonoscopy is not recommended due to                            current age (12 years or older) for surveillance.                            With her need for home oxygen, the current                            findings, i.e., no polyps, and her age, a routine  surveillance colonoscopy is not recommended. Procedure Code(s):        --- Professional ---                           [phone], Colonoscopy, flexible; diagnostic, including                            collection of specimen(s) by brushing or washing,                            when performed (separate procedure) Diagnosis Code(s):        --- Professional ---                           Z86.010, Personal history of colonic polyps                           K55.20, Angiodysplasia of colon without hemorrhage                           K57.30, Diverticulosis of large intestine without                            perforation or abscess without bleeding CPT copyright 2019 [employer]. All rights reserved. The codes documented in this report are preliminary and upon coder review may  be revised to meet current compliance requirements. Carol Ada, MD Carol Ada, MD 01/25/2020 11:17:59 AM This report has been signed electronically. Number of Addenda: 0

## 2020-01-25 NOTE — Discharge Instructions (Signed)
YOU HAD AN ENDOSCOPIC PROCEDURE TODAY: Refer to the procedure report and other information in the discharge instructions given to you for any specific questions about what was found during the examination. If this information does not answer your questions, please call [facility] at [phone] to clarify.   YOU SHOULD EXPECT: Some feelings of bloating in the abdomen. Passage of more gas than usual. Walking can help get rid of the air that was put into your GI tract during the procedure and reduce the bloating. If you had a lower endoscopy (such as a colonoscopy or flexible sigmoidoscopy) you may notice spotting of blood in your stool or on the toilet paper. Some abdominal soreness may be present for a day or two, also.  DIET: Your first meal following the procedure should be a light meal and then it is ok to progress to your normal diet. A half-sandwich or bowl of soup is an example of a good first meal. Heavy or fried foods are harder to digest and may make you feel nauseous or bloated. Drink plenty of fluids but you should avoid alcoholic beverages for 24 hours. If you had an esophageal dilation, please see attached information for diet.   ACTIVITY: Your care partner should take you home directly after the procedure. You should plan to take it easy, moving slowly for the rest of the day. You can resume normal activity the day after the procedure however YOU SHOULD NOT DRIVE, use power tools, machinery or perform tasks that involve climbing or major physical exertion for 24 hours (because of the sedation medicines used during the test).   SYMPTOMS TO REPORT IMMEDIATELY: A gastroenterologist can be reached at any hour. Please call [phone]  for any of the following symptoms:  Following lower endoscopy (colonoscopy, flexible sigmoidoscopy) Excessive amounts of blood in the stool  Significant tenderness, worsening of abdominal pains  Swelling of the abdomen that is new, acute  Fever of  100 or higher  Following upper endoscopy (EGD, EUS, ERCP, esophageal dilation) Vomiting of blood or coffee ground material  New, significant abdominal pain  New, significant chest pain or pain under the shoulder blades  Painful or persistently difficult swallowing  New shortness of breath  Black, tarry-looking or red, bloody stools  FOLLOW UP:  If any biopsies were taken you will be contacted by phone or by letter within the next 1-3 weeks. Call [phone]  if you have not heard about the biopsies in 3 weeks.  Please also call with any specific questions about appointments or follow up tests.

## 2020-01-25 NOTE — Anesthesia Procedure Notes (Signed)
Procedure Name: MAC Date/Time: 01/25/2020 10:44 AM Performed by: Maxwell Caul, CRNA Pre-anesthesia Checklist: Patient identified, Emergency Drugs available, Suction available and Patient being monitored Oxygen Delivery Method: Simple face mask

## 2020-01-28 ENCOUNTER — Encounter: Payer: Self-pay | Admitting: *Deleted

## 2020-01-30 DIAGNOSIS — F329 Major depressive disorder, single episode, unspecified: Secondary | ICD-10-CM | POA: Diagnosis not present

## 2020-01-30 DIAGNOSIS — F4323 Adjustment disorder with mixed anxiety and depressed mood: Secondary | ICD-10-CM | POA: Diagnosis not present

## 2020-02-04 NOTE — Anesthesia Postprocedure Evaluation (Signed)
Anesthesia Post Note  Patient: Roena Sassaman Lucken  Procedure(s) Performed: COLONOSCOPY WITH PROPOFOL (N/A )     Patient location during evaluation: PACU Anesthesia Type: MAC Level of consciousness: awake and alert Pain management: pain level controlled Vital Signs Assessment: post-procedure vital signs reviewed and stable Respiratory status: spontaneous breathing, nonlabored ventilation, respiratory function stable and patient connected to nasal cannula oxygen Cardiovascular status: stable and blood pressure returned to baseline Postop Assessment: no apparent nausea or vomiting Anesthetic complications: no    Last Vitals:  Vitals:   01/25/20 1122 01/25/20 1129  BP: (!) 130/95 (!) 145/105  Pulse: 72 70  Resp: 16 19  Temp:    SpO2: 100% 100%    Last Pain:  Vitals:   01/28/20 1342  TempSrc:   PainSc: 0-No pain                 Orlandria Kissner

## 2020-02-06 DIAGNOSIS — F4323 Adjustment disorder with mixed anxiety and depressed mood: Secondary | ICD-10-CM | POA: Diagnosis not present

## 2020-02-06 DIAGNOSIS — F329 Major depressive disorder, single episode, unspecified: Secondary | ICD-10-CM | POA: Diagnosis not present

## 2020-02-13 DIAGNOSIS — F329 Major depressive disorder, single episode, unspecified: Secondary | ICD-10-CM | POA: Diagnosis not present

## 2020-02-13 DIAGNOSIS — F4323 Adjustment disorder with mixed anxiety and depressed mood: Secondary | ICD-10-CM | POA: Diagnosis not present

## 2020-04-09 DIAGNOSIS — F329 Major depressive disorder, single episode, unspecified: Secondary | ICD-10-CM | POA: Diagnosis not present

## 2020-04-09 DIAGNOSIS — F4323 Adjustment disorder with mixed anxiety and depressed mood: Secondary | ICD-10-CM | POA: Diagnosis not present

## 2020-04-16 DIAGNOSIS — F329 Major depressive disorder, single episode, unspecified: Secondary | ICD-10-CM | POA: Diagnosis not present

## 2020-04-16 DIAGNOSIS — F4323 Adjustment disorder with mixed anxiety and depressed mood: Secondary | ICD-10-CM | POA: Diagnosis not present

## 2020-04-18 ENCOUNTER — Telehealth: Payer: Self-pay | Admitting: Emergency Medicine

## 2020-04-18 MED ORDER — PREDNISONE 10 MG PO TABS: ORAL_TABLET | ORAL | 0 refills | Status: DC

## 2020-04-18 NOTE — Telephone Encounter (Signed)
Called and spoke with pt who stated that she has been having more increased SOB with exertion.   Pt stated she had to bump herself up to 8L O2 2 days ago from her 6L that she had been doing. She has been trying to keep it around 4L at rest and 6L with exertion but states that with little ambulation she has had to bump herself up to 8L.  Pt also states that she has been confused recently and is unsure if this could be due to not getting enough O2 to the brain.  Pt denies any complaints of cough. Pt has had postnasal drainage due to allergies but states that no problems with coughing. Pt denies any complaints of wheezing.  Pt is using her trelegy inhaler every day as scheduled.   Pt wants to know what could be recommended to help with her symptoms. First avail in-office visit with either an APP or RB is not until 7/22. Dr. Lamonte Sakai, please advise.

## 2020-04-18 NOTE — Telephone Encounter (Signed)
Difficult for me to diagnose cause in absence wheeze, cough, overt evidence worsened obstruction.  We can give her a brief prednisone taper to see if she benefits, but if her SOB persists then she will either have to be worked in at our office or go to the ED.   Prednisone >> 61m daily for 3 days, then 255mdaily for 3 days, then 1033maily for 3 days, then stop

## 2020-04-18 NOTE — Telephone Encounter (Signed)
Called and spoke with pt letting her know the info stated by RB and that we were going to send pred taper to pharmacy for her. Pt verbalized understanding. Stated to pt to go to ED if breathing becomes worse or call our office for an appt. Nothing further needed.

## 2020-04-23 DIAGNOSIS — F329 Major depressive disorder, single episode, unspecified: Secondary | ICD-10-CM | POA: Diagnosis not present

## 2020-04-23 DIAGNOSIS — F4323 Adjustment disorder with mixed anxiety and depressed mood: Secondary | ICD-10-CM | POA: Diagnosis not present

## 2020-04-30 DIAGNOSIS — F4323 Adjustment disorder with mixed anxiety and depressed mood: Secondary | ICD-10-CM | POA: Diagnosis not present

## 2020-04-30 DIAGNOSIS — F329 Major depressive disorder, single episode, unspecified: Secondary | ICD-10-CM | POA: Diagnosis not present

## 2020-05-07 DIAGNOSIS — F4323 Adjustment disorder with mixed anxiety and depressed mood: Secondary | ICD-10-CM | POA: Diagnosis not present

## 2020-05-07 DIAGNOSIS — F329 Major depressive disorder, single episode, unspecified: Secondary | ICD-10-CM | POA: Diagnosis not present

## 2020-05-13 ENCOUNTER — Telehealth: Payer: Self-pay | Admitting: Emergency Medicine

## 2020-05-13 DIAGNOSIS — J449 Chronic obstructive pulmonary disease, unspecified: Secondary | ICD-10-CM

## 2020-05-14 NOTE — Telephone Encounter (Signed)
Spoke with Kayla Mack  She states that they did not call  There was no phone number listed or name of person who called  Called the pt  She states that she is interested in getting a motorized wheelchair  Dr Lamonte Sakai, is this okay? She has appt coming up and would need to mention mobility issues in your notes if so  Please advise thanks

## 2020-05-14 NOTE — Telephone Encounter (Signed)
Ok with me - we should order it now, and then I can review the mobility requirements with her at her next OV

## 2020-05-14 NOTE — Telephone Encounter (Signed)
Order sent to [facility] and pt made aware

## 2020-05-20 IMAGING — DX DG CHEST 1V PORT
1 series · 1 of 1 positions shown · non-contrast
Comparison: Chest radiograph May 09, 2017

CLINICAL DATA: Respiratory distress. On home oxygen. History of
COPD.

EXAM:
PORTABLE CHEST 1 VIEW

[chest ap]
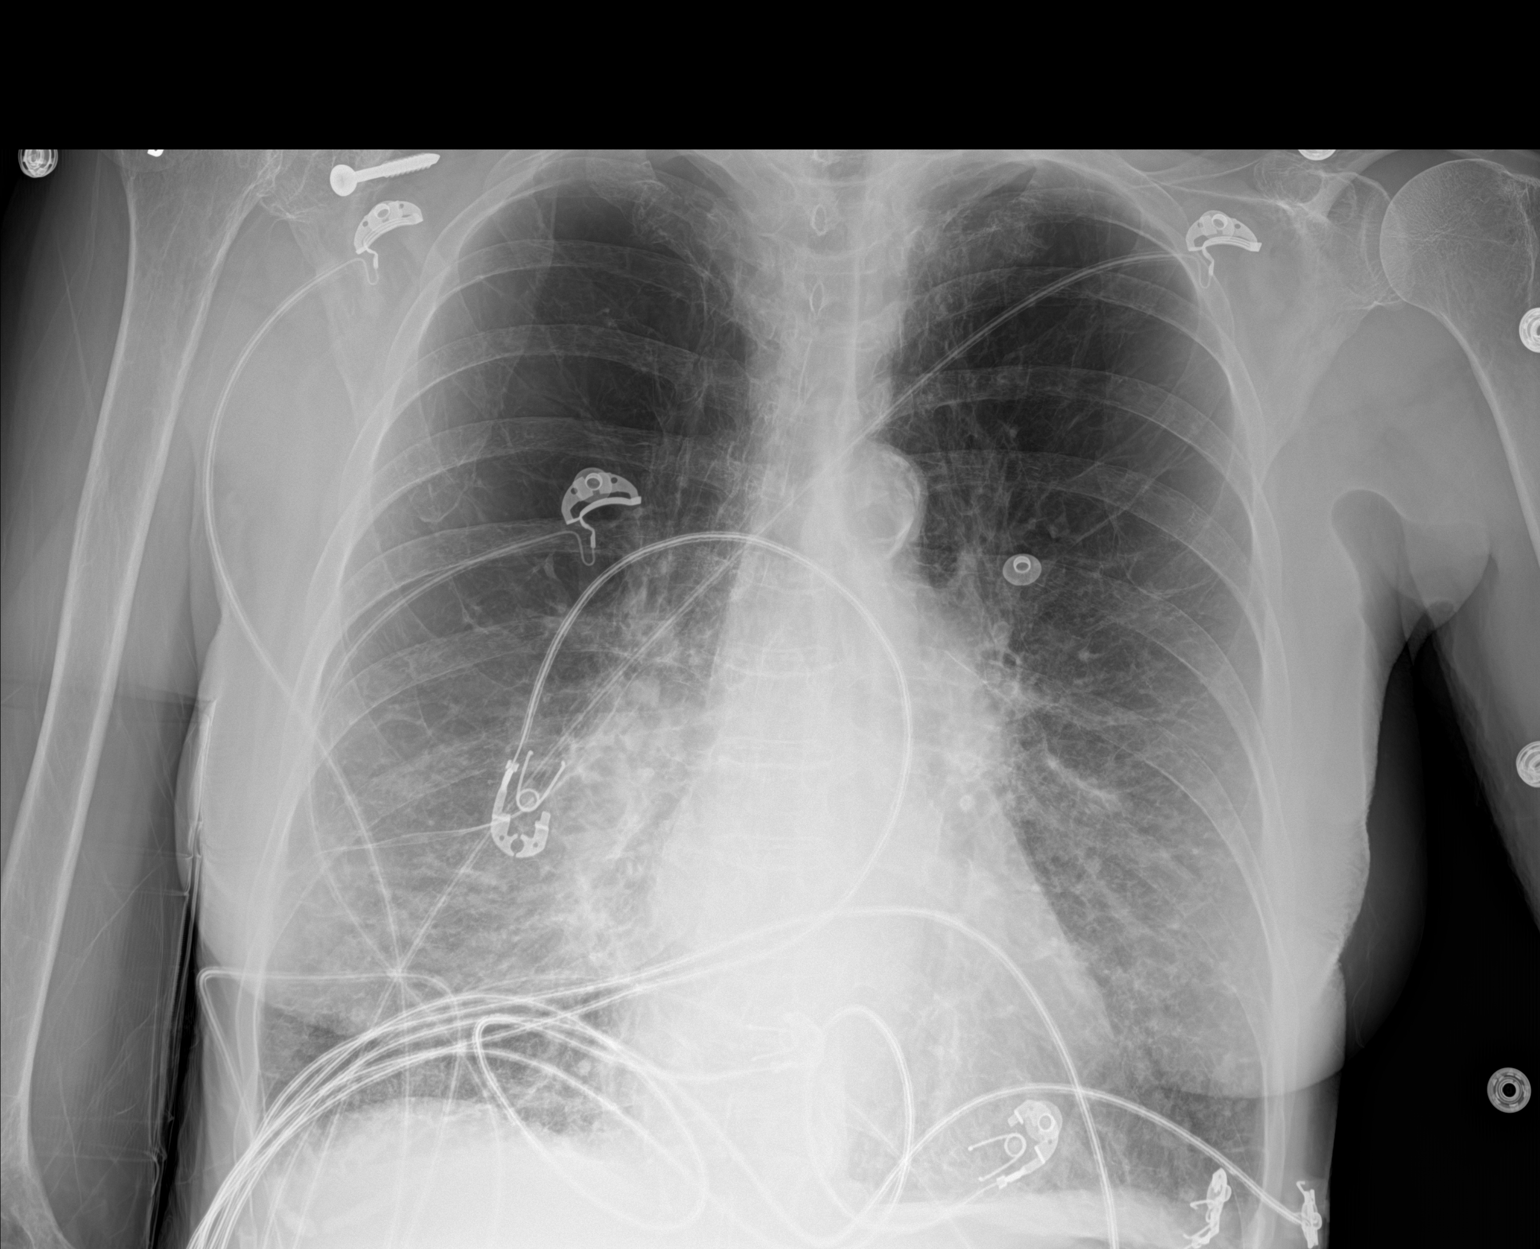

[1 of 1 positions shown; findings below may reference images not displayed]

FINDINGS: Cardiac silhouette is mildly enlarged and unchanged. Calcified
aortic arch. Increased interstitial prominence with apical bullous
changes and increased lung volumes. No pleural effusion or focal
consolidation. No pneumothorax. Osteopenia. RIGHT scapular screw.
IMPRESSION: COPD with increased interstitial prominence, possible atypical
infection/bronchitis.

Mild cardiomegaly.

Aortic Atherosclerosis ([id number]).

## 2020-05-22 DIAGNOSIS — F329 Major depressive disorder, single episode, unspecified: Secondary | ICD-10-CM | POA: Diagnosis not present

## 2020-05-22 DIAGNOSIS — F4323 Adjustment disorder with mixed anxiety and depressed mood: Secondary | ICD-10-CM | POA: Diagnosis not present

## 2020-06-04 DIAGNOSIS — F329 Major depressive disorder, single episode, unspecified: Secondary | ICD-10-CM | POA: Diagnosis not present

## 2020-06-04 DIAGNOSIS — F4323 Adjustment disorder with mixed anxiety and depressed mood: Secondary | ICD-10-CM | POA: Diagnosis not present

## 2020-06-09 ENCOUNTER — Other Ambulatory Visit: Payer: Self-pay

## 2020-06-09 ENCOUNTER — Ambulatory Visit (INDEPENDENT_AMBULATORY_CARE_PROVIDER_SITE_OTHER): Payer: Medicare Other | Admitting: Emergency Medicine

## 2020-06-09 ENCOUNTER — Encounter: Payer: Self-pay | Admitting: Emergency Medicine

## 2020-06-09 DIAGNOSIS — J441 Chronic obstructive pulmonary disease with (acute) exacerbation: Secondary | ICD-10-CM | POA: Diagnosis not present

## 2020-06-09 DIAGNOSIS — J9611 Chronic respiratory failure with hypoxia: Secondary | ICD-10-CM

## 2020-06-09 NOTE — Progress Notes (Addendum)
Subjective:    Patient ID: Kayla Mack, female    DOB: Oct 03, 1946, 74 y.o.   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 She is on a Trilogy vent qhs. On trelegy, DuoNeb prn, albuterol prn averaging about once a day. She was treated with pred for an AE-COPD in early July 2021.  She has high oxygen need, currently uses 5-10L/min depending on her exertional level.  She benefited from the prednisone, feels that her cough and wheezing improved.  She is wondering about getting a motorized wheelchair to help with mobilization. She is going through her O2 quickly - has run out a couple times.  She is wondering about getting help at home with cleaning, chores. She had palliative care services went away during [place]. She wonders whether she could restart some of this including PT.   Mobility evaluation 06/09/20:  Walk distance before significant SOB is 25-30 ft. She has to stop to rest with ambulation through her house.  She is unable to lift or carry any significant weight, having a lot of trouble shopping - she uses an electric cart and has to rest frequently. Carry her O2 tanks has become a limiting factor.  She believes that a motorized wheelchair or scooter could get through the doorways of her house. She does not have a ramp at her house.    Review of Systems As per HPI     Objective:   Physical Exam Vitals:   06/09/20 1111  BP: 130/64  Pulse: 65  Temp: 98 F (36.7 C)  TempSrc: Temporal  SpO2: 93%  Weight: 133 lb 9.6 oz (60.6 kg)  Height: _0  (1.575 m)   Gen: Pleasant, elderly woman, in no distress,  normal affect, O2 in place  ENT: No lesions,  mouth clear,  oropharynx clear, no postnasal drip, strong voice  Neck: No JVD, no stridor  Lungs: No use of accessory muscles, no crackles or wheezing on normal respiration, she does wheeze on a forced expiration.   Cardiovascular: RRR, heart sounds  normal, no murmur or gallops, no peripheral edema  Musculoskeletal: No deformities, no cyanosis or clubbing  Neuro: alert, awake, non focal  Skin: Warm, no lesions or rash      Assessment & Plan:  COPD (chronic obstructive pulmonary disease) ([place]) Please continue your Trelegy as you have been taking it Keep albuterol available to use 2 puffs when you need it for shortness of breath, chest tightness, wheezing. We will check into the status of our referral for possible scooter or motorized wheelchair.  We documented your mobility needs in office today. Call palliative care to see if you are still affiliated.  If so see if they can initiate home physical therapy.  If not then let us know and we may decide to re-refer you Follow with Dr Lamonte Sakai in 3 months or sooner if you have any problems.   Addendum added 06/24/20:  After evaluating the patient, discussing her needs, goals, it is my medical opinion that the patient needs a motorized wheelchair.  This will enhance mobility, ability to do ADLs, accessibility through the doorways and rooms of her home.  Addendum 07/08/2020: More details gathered regarding patient's limitations, her need for support. Importantly she is unable to safely transfer on / off scooter. Motorized wheelchair is needed to provide her mobility. Scooter will not meet her needs and is unsafe due to the transfer limitations.  Chronic respiratory failure with hypoxia (HCC) Continue your oxygen at 5 to 10 L/min depending on your level of exertion.  Be very careful to keep track of the amount of oxygen available so that you do not run the risk of running out. Continue to use your trilogy ventilator every night while sleeping.   Baltazar Apo, MD, PhD 07/23/2020, 12:44 PM [facility] [phone] or if no answer [phone]

## 2020-06-09 NOTE — Assessment & Plan Note (Addendum)
Please continue your Trelegy as you have been taking it Keep albuterol available to use 2 puffs when you need it for shortness of breath, chest tightness, wheezing. We will check into the status of our referral for possible scooter or motorized wheelchair.  We documented your mobility needs in office today. Call palliative care to see if you are still affiliated.  If so see if they can initiate home physical therapy.  If not then let us know and we may decide to re-refer you Follow with Dr Lamonte Sakai in 3 months or sooner if you have any problems.   Addendum added 06/24/20:  After evaluating the patient, discussing her needs, goals, it is my medical opinion that the patient needs a motorized wheelchair.  This will enhance mobility, ability to do ADLs, accessibility through the doorways and rooms of her home.  Addendum 07/08/2020: More details gathered regarding patient's limitations, her need for support. Importantly she is unable to safely transfer on / off scooter. Motorized wheelchair is needed to provide her mobility. Scooter will not meet her needs and is unsafe due to the transfer limitations.

## 2020-06-09 NOTE — Patient Instructions (Addendum)
Please continue your Trelegy as you have been taking it Keep albuterol available to use 2 puffs when you need it for shortness of breath, chest tightness, wheezing. Continue your oxygen at 5 to 10 L/min depending on your level of exertion.  Be very careful to keep track of the amount of oxygen available so that you do not run the risk of running out. Continue to use your trilogy ventilator every night while sleeping. We will check into the status of our referral for possible scooter or motorized wheelchair.  We documented your mobility needs in office today. Call palliative care to see if you are still affiliated.  If so see if they can initiate home physical therapy.  If not then let us know and we may decide to re-refer you Follow with Dr Lamonte Sakai in 3 months or sooner if you have any problems.

## 2020-06-09 NOTE — Assessment & Plan Note (Signed)
Continue your oxygen at 5 to 10 L/min depending on your level of exertion.  Be very careful to keep track of the amount of oxygen available so that you do not run the risk of running out. Continue to use your trilogy ventilator every night while sleeping.

## 2020-06-11 DIAGNOSIS — F4323 Adjustment disorder with mixed anxiety and depressed mood: Secondary | ICD-10-CM | POA: Diagnosis not present

## 2020-06-12 DIAGNOSIS — Z961 Presence of intraocular lens: Secondary | ICD-10-CM | POA: Diagnosis not present

## 2020-06-17 DIAGNOSIS — L905 Scar conditions and fibrosis of skin: Secondary | ICD-10-CM | POA: Diagnosis not present

## 2020-06-17 DIAGNOSIS — D225 Melanocytic nevi of trunk: Secondary | ICD-10-CM | POA: Diagnosis not present

## 2020-06-17 DIAGNOSIS — L820 Inflamed seborrheic keratosis: Secondary | ICD-10-CM | POA: Diagnosis not present

## 2020-06-17 DIAGNOSIS — L821 Other seborrheic keratosis: Secondary | ICD-10-CM | POA: Diagnosis not present

## 2020-06-17 DIAGNOSIS — L814 Other melanin hyperpigmentation: Secondary | ICD-10-CM | POA: Diagnosis not present

## 2020-06-17 DIAGNOSIS — Z85828 Personal history of other malignant neoplasm of skin: Secondary | ICD-10-CM | POA: Diagnosis not present

## 2020-06-17 DIAGNOSIS — L570 Actinic keratosis: Secondary | ICD-10-CM | POA: Diagnosis not present

## 2020-06-17 DIAGNOSIS — D1801 Hemangioma of skin and subcutaneous tissue: Secondary | ICD-10-CM | POA: Diagnosis not present

## 2020-06-18 ENCOUNTER — Telehealth: Payer: Self-pay | Admitting: Emergency Medicine

## 2020-06-18 DIAGNOSIS — F4323 Adjustment disorder with mixed anxiety and depressed mood: Secondary | ICD-10-CM | POA: Diagnosis not present

## 2020-06-18 NOTE — Telephone Encounter (Signed)
Spoke with Santiago Glad at  [facility]  She states that she needs Dr Lamonte Sakai to addend note from 06/09/20  She states that there are several times that it mentions "scooter or power wheelchair" and it must say one or the other  It can not read both of these  Please advise thanks

## 2020-06-24 NOTE — Telephone Encounter (Addendum)
Spoke with Santiago Glad at [employer] regarding prior message. Raynaldo Opitz that Dr.Byrum made a addendum to patient's office note  and fixed the wording.Advised karen I will fax the corrected office visit.Karen's voice was understanding.Nothing else further needed.

## 2020-06-24 NOTE — Telephone Encounter (Signed)
Attempted to call karen with [employer] but unable to reach. Left message for her to return call.

## 2020-06-24 NOTE — Telephone Encounter (Signed)
I just made an addendum to that note - in the Assessment / Plan section. Probably need to re-print and send again. Thanks.

## 2020-06-25 DIAGNOSIS — F4323 Adjustment disorder with mixed anxiety and depressed mood: Secondary | ICD-10-CM | POA: Diagnosis not present

## 2020-07-02 DIAGNOSIS — F4323 Adjustment disorder with mixed anxiety and depressed mood: Secondary | ICD-10-CM | POA: Diagnosis not present

## 2020-07-03 ENCOUNTER — Telehealth: Payer: Self-pay | Admitting: Emergency Medicine

## 2020-07-03 NOTE — Telephone Encounter (Signed)
Note has been printed and faxed

## 2020-07-07 DIAGNOSIS — F4323 Adjustment disorder with mixed anxiety and depressed mood: Secondary | ICD-10-CM | POA: Diagnosis not present

## 2020-07-08 ENCOUNTER — Telehealth: Payer: Self-pay

## 2020-07-08 NOTE — Telephone Encounter (Signed)
I just added another addendum to the note. Hopefully this will suffice. Please resubmit the request.

## 2020-07-08 NOTE — Telephone Encounter (Signed)
Spoke with Santiago Glad from [employer] regarding trying to get patient approved for a electric wheel chair Patient was Denied.please  refer to last office note from 06/09/20 it doesn't stated that why patient can not use a scooter because patient can not transferre safely on and off the scooter .   Dr.Bryum can you please advise.  Thank you

## 2020-07-10 NOTE — Telephone Encounter (Signed)
Spoke with Santiago Glad regarding prior message. Faxed [phone]. new addendum note 07/08/20 to [facility] regarding patient. Santiago Glad is aware of the fax and voice was understanding . Nothing else further needed.

## 2020-07-14 DIAGNOSIS — F4323 Adjustment disorder with mixed anxiety and depressed mood: Secondary | ICD-10-CM | POA: Diagnosis not present

## 2020-07-21 ENCOUNTER — Telehealth: Payer: Self-pay | Admitting: Emergency Medicine

## 2020-07-21 DIAGNOSIS — F4323 Adjustment disorder with mixed anxiety and depressed mood: Secondary | ICD-10-CM | POA: Diagnosis not present

## 2020-07-22 ENCOUNTER — Telehealth: Payer: Self-pay | Admitting: Emergency Medicine

## 2020-07-22 NOTE — Telephone Encounter (Signed)
Dr. Lamonte Sakai, please see note from Santiago Glad with [facility] and advise.

## 2020-07-22 NOTE — Telephone Encounter (Signed)
Called and LMTCB  I do not see that we have seen this pt before

## 2020-07-23 NOTE — Telephone Encounter (Signed)
I have refaxed pt's OV from 06/09/20 to Santiago Glad. I have circled the addendums that were done by RB and wrote out beside it addendum to make it stand out. Nothing further needed.

## 2020-07-23 NOTE — Telephone Encounter (Signed)
I spoke with Santiago Glad. Apparently they never received the addended note that I did on 07/08/20. Please re-send her a copy of the patient's office note from 06/09/20. Make sure the addendum I wrote 07/08/20 is in the note for her to review.

## 2020-07-29 DIAGNOSIS — F4323 Adjustment disorder with mixed anxiety and depressed mood: Secondary | ICD-10-CM | POA: Diagnosis not present

## 2020-08-11 DIAGNOSIS — F4323 Adjustment disorder with mixed anxiety and depressed mood: Secondary | ICD-10-CM | POA: Diagnosis not present

## 2020-08-15 DIAGNOSIS — Z23 Encounter for immunization: Secondary | ICD-10-CM | POA: Diagnosis not present

## 2020-08-18 DIAGNOSIS — F4323 Adjustment disorder with mixed anxiety and depressed mood: Secondary | ICD-10-CM | POA: Diagnosis not present

## 2020-08-19 DIAGNOSIS — I10 Essential (primary) hypertension: Secondary | ICD-10-CM | POA: Diagnosis not present

## 2020-08-19 DIAGNOSIS — Z8249 Family history of ischemic heart disease and other diseases of the circulatory system: Secondary | ICD-10-CM | POA: Diagnosis not present

## 2020-08-19 DIAGNOSIS — E7849 Other hyperlipidemia: Secondary | ICD-10-CM | POA: Diagnosis not present

## 2020-08-19 DIAGNOSIS — E7800 Pure hypercholesterolemia, unspecified: Secondary | ICD-10-CM | POA: Diagnosis not present

## 2020-08-19 DIAGNOSIS — Z1379 Encounter for other screening for genetic and chromosomal anomalies: Secondary | ICD-10-CM | POA: Diagnosis not present

## 2020-08-25 DIAGNOSIS — F4323 Adjustment disorder with mixed anxiety and depressed mood: Secondary | ICD-10-CM | POA: Diagnosis not present

## 2020-09-04 DIAGNOSIS — F4323 Adjustment disorder with mixed anxiety and depressed mood: Secondary | ICD-10-CM | POA: Diagnosis not present

## 2020-09-08 DIAGNOSIS — B029 Zoster without complications: Secondary | ICD-10-CM | POA: Diagnosis not present

## 2020-09-09 DIAGNOSIS — B029 Zoster without complications: Secondary | ICD-10-CM | POA: Diagnosis not present

## 2020-09-10 ENCOUNTER — Ambulatory Visit (INDEPENDENT_AMBULATORY_CARE_PROVIDER_SITE_OTHER): Payer: Medicare Other | Admitting: Emergency Medicine

## 2020-09-10 ENCOUNTER — Encounter: Payer: Self-pay | Admitting: Emergency Medicine

## 2020-09-10 ENCOUNTER — Other Ambulatory Visit: Payer: Self-pay

## 2020-09-10 DIAGNOSIS — J441 Chronic obstructive pulmonary disease with (acute) exacerbation: Secondary | ICD-10-CM

## 2020-09-10 DIAGNOSIS — J9611 Chronic respiratory failure with hypoxia: Secondary | ICD-10-CM

## 2020-09-10 NOTE — Progress Notes (Signed)
Subjective:    Patient ID: Kayla Mack, female    DOB: [birth year], 74 y.o.   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 She is on a Trilogy vent qhs. On trelegy, DuoNeb prn, albuterol prn averaging about once a day. She was treated with pred for an AE-COPD in early July 2021.  She has high oxygen need, currently uses 5-10L/min depending on her exertional level.  She benefited from the prednisone, feels that her cough and wheezing improved.  She is wondering about getting a motorized wheelchair to help with mobilization. She is going through her O2 quickly - has run out a couple times.  She is wondering about getting help at home with cleaning, chores. She had palliative care services went away during [place]. She wonders whether she could restart some of this including PT.   Mobility evaluation 06/09/20:  Walk distance before significant SOB is 25-30 ft. She has to stop to rest with ambulation through her house.  She is unable to lift or carry any significant weight, having a lot of trouble shopping - she uses an electric cart and has to rest frequently. Carry her O2 tanks has become a limiting factor.  She believes that a motorized wheelchair or scooter could get through the doorways of her house. She does not have a ramp at her house.   ROV 09/10/20 --this is a follow-up visit for 74 year old woman with very severe COPD, secondary pulmonary hypertension and combined respiratory failure.  She uses a trilogy ventilator at night.  Reports that her compliance is intermittent, uses 3-4x a week. She is not on daily prednisone. She has Zoster that has developed on her R forehead - on valaciclovir.   She is on Trelegy, DuoNeb  Oxygen at 5 to 10 L/min depending on her exertion We did paperwork and orders for her to get a motorized wheelchair.  It sounds like the orders have gone through.  This is on back order and should  come in March 2022    Review of Systems As per HPI     Objective:   Physical Exam Vitals:   09/10/20 1025  BP: 122/68  Pulse: 70  Temp: 97.6 F (36.4 C)  SpO2: 96%  Weight: 134 lb 6.4 oz (61 kg)  Height: _0  (1.575 m)   Gen: Pleasant, elderly woman, in no distress,  normal affect, O2 in place  ENT: erythema rash above her R eye on forehead,  mouth clear,  oropharynx clear, no postnasal drip, strong voice  Neck: No JVD, no stridor  Lungs: No use of accessory muscles, no crackles or wheezing on normal respiration, she does wheeze on a forced expiration.   Cardiovascular: RRR, heart sounds normal, no murmur or gallops, no peripheral edema  Musculoskeletal: No deformities, no cyanosis or clubbing  Neuro: alert, awake, non focal  Skin: Warm, no lesions or rash      Assessment & Plan:  COPD (chronic obstructive pulmonary disease) (HCC) Severe disease, quite limited but overall stable.  I think she will benefit significantly with regard to mobility, functional capacity when her motorized wheelchair is available.  She is looking forward to getting it.  We will plan to continue her current maintenance COPD regimen.  Vaccines are up-to-date.  Please continue Trelegy 1 inhalation once daily.  Rinse and gargle after using. Continue DuoNeb up to every 6 hours if needed for shortness of breath, chest  tightness, wheezing. COVID-19 and flu vaccines are both up-to-date I am glad that we have ordered and met criteria for you to get your motorized wheelchair.  Anticipate its arrival in March Follow Dr. Lamonte Sakai in January 2022 or sooner if you have any problems.  Chronic respiratory failure with hypoxia (HCC) Continue your oxygen at 5-10 L/min depending on your level of exertion. Use your trilogy ventilator machine every night while sleeping  Baltazar Apo, MD, PhD 09/10/2020, 11:01 AM [facility] [phone] or if no answer [phone]

## 2020-09-10 NOTE — Assessment & Plan Note (Signed)
Severe disease, quite limited but overall stable.  I think she will benefit significantly with regard to mobility, functional capacity when her motorized wheelchair is available.  She is looking forward to getting it.  We will plan to continue her current maintenance COPD regimen.  Vaccines are up-to-date.  Please continue Trelegy 1 inhalation once daily.  Rinse and gargle after using. Continue DuoNeb up to every 6 hours if needed for shortness of breath, chest tightness, wheezing. COVID-19 and flu vaccines are both up-to-date I am glad that we have ordered and met criteria for you to get your motorized wheelchair.  Anticipate its arrival in March Follow Dr. Lamonte Sakai in January 2022 or sooner if you have any problems.

## 2020-09-10 NOTE — Patient Instructions (Addendum)
Please continue Trelegy 1 inhalation once daily.  Rinse and gargle after using. Continue DuoNeb up to every 6 hours if needed for shortness of breath, chest tightness, wheezing. Continue your oxygen at 5-10 L/min depending on your level of exertion. Use your trilogy ventilator machine every night while sleeping COVID-19 and flu vaccines are both up-to-date I am glad that we have ordered and met criteria for you to get your motorized wheelchair.  Anticipate its arrival in March Follow Dr. Lamonte Sakai in January 2022 or sooner if you have any problems.

## 2020-09-10 NOTE — Assessment & Plan Note (Signed)
Continue your oxygen at 5-10 L/min depending on your level of exertion. Use your trilogy ventilator machine every night while sleeping

## 2020-09-15 DIAGNOSIS — F4323 Adjustment disorder with mixed anxiety and depressed mood: Secondary | ICD-10-CM | POA: Diagnosis not present

## 2020-09-23 DIAGNOSIS — F4323 Adjustment disorder with mixed anxiety and depressed mood: Secondary | ICD-10-CM | POA: Diagnosis not present

## 2020-09-30 DIAGNOSIS — F4323 Adjustment disorder with mixed anxiety and depressed mood: Secondary | ICD-10-CM | POA: Diagnosis not present

## 2020-10-13 DIAGNOSIS — F4323 Adjustment disorder with mixed anxiety and depressed mood: Secondary | ICD-10-CM | POA: Diagnosis not present

## 2020-10-23 DIAGNOSIS — F4323 Adjustment disorder with mixed anxiety and depressed mood: Secondary | ICD-10-CM | POA: Diagnosis not present

## 2020-10-28 ENCOUNTER — Telehealth: Payer: Self-pay | Admitting: Emergency Medicine

## 2020-10-28 DIAGNOSIS — J441 Chronic obstructive pulmonary disease with (acute) exacerbation: Secondary | ICD-10-CM

## 2020-10-28 NOTE — Telephone Encounter (Signed)
lmtcb for pt.  

## 2020-10-29 DIAGNOSIS — F4323 Adjustment disorder with mixed anxiety and depressed mood: Secondary | ICD-10-CM | POA: Diagnosis not present

## 2020-10-31 MED ORDER — IPRATROPIUM-ALBUTEROL 0.5-2.5 (3) MG/3ML IN SOLN: 3.0000 mL | Freq: Every day | RESPIRATORY_TRACT | 5 refills | Status: DC

## 2020-10-31 NOTE — Telephone Encounter (Signed)
Pt returning missed call. [phone]

## 2020-10-31 NOTE — Telephone Encounter (Signed)
Called and spoke with pt to clarify neb sol that she was needing to have refilled. Verified preferred pharmacy and sent Rx for pt's ipratropium-albuterol solution to pharmacy for her. Nothing further needed.

## 2020-11-05 DIAGNOSIS — F4323 Adjustment disorder with mixed anxiety and depressed mood: Secondary | ICD-10-CM | POA: Diagnosis not present

## 2020-11-12 DIAGNOSIS — F4323 Adjustment disorder with mixed anxiety and depressed mood: Secondary | ICD-10-CM | POA: Diagnosis not present

## 2020-11-17 ENCOUNTER — Telehealth: Payer: Self-pay | Admitting: Emergency Medicine

## 2020-11-17 DIAGNOSIS — F4323 Adjustment disorder with mixed anxiety and depressed mood: Secondary | ICD-10-CM | POA: Diagnosis not present

## 2020-11-17 DIAGNOSIS — J441 Chronic obstructive pulmonary disease with (acute) exacerbation: Secondary | ICD-10-CM

## 2020-11-17 NOTE — Telephone Encounter (Signed)
Called and spoke with pt in regards to wheelchair. Order was placed back 04/2020 but pt still has not received wheelchair needed.  Pt stated what she was needing is a portable wheelchair that is battery operated and lighter weight. Stated to pt that we would place order and she verbalized understanding. Nothing further needed.

## 2020-11-24 DIAGNOSIS — F4323 Adjustment disorder with mixed anxiety and depressed mood: Secondary | ICD-10-CM | POA: Diagnosis not present

## 2020-12-03 DIAGNOSIS — F4323 Adjustment disorder with mixed anxiety and depressed mood: Secondary | ICD-10-CM | POA: Diagnosis not present

## 2020-12-15 DIAGNOSIS — F4323 Adjustment disorder with mixed anxiety and depressed mood: Secondary | ICD-10-CM | POA: Diagnosis not present

## 2020-12-25 DIAGNOSIS — F4323 Adjustment disorder with mixed anxiety and depressed mood: Secondary | ICD-10-CM | POA: Diagnosis not present

## 2021-01-01 DIAGNOSIS — F4323 Adjustment disorder with mixed anxiety and depressed mood: Secondary | ICD-10-CM | POA: Diagnosis not present

## 2021-01-05 ENCOUNTER — Encounter: Payer: Self-pay | Admitting: Adult Health

## 2021-01-05 ENCOUNTER — Ambulatory Visit (INDEPENDENT_AMBULATORY_CARE_PROVIDER_SITE_OTHER): Payer: Medicare Other | Admitting: Adult Health

## 2021-01-05 ENCOUNTER — Telehealth: Payer: Self-pay

## 2021-01-05 ENCOUNTER — Other Ambulatory Visit: Payer: Self-pay

## 2021-01-05 DIAGNOSIS — J9611 Chronic respiratory failure with hypoxia: Secondary | ICD-10-CM

## 2021-01-05 DIAGNOSIS — J441 Chronic obstructive pulmonary disease with (acute) exacerbation: Secondary | ICD-10-CM

## 2021-01-05 MED ORDER — IPRATROPIUM-ALBUTEROL 0.5-2.5 (3) MG/3ML IN SOLN: 3.0000 mL | Freq: Every day | RESPIRATORY_TRACT | 5 refills | Status: DC

## 2021-01-05 MED ORDER — ALBUTEROL SULFATE (2.5 MG/3ML) 0.083% IN NEBU: 2.5000 mg | INHALATION_SOLUTION | Freq: Four times a day (QID) | RESPIRATORY_TRACT | 5 refills | Status: AC | PRN

## 2021-01-05 NOTE — Addendum Note (Signed)
Addended by: Mathis Bud on: 01/05/2021 12:30 PM   Modules accepted: Orders

## 2021-01-05 NOTE — Progress Notes (Signed)
_0  ID: Kayla Mack, female    DOB: Dec 03, 1945, 75 y.o.   MRN: [id number]  Chief Complaint  Patient presents with  . Follow-up    Referring provider: Janie Morning, DO  HPI: 75 year old female followed for very severe COPD, chronic respiratory failure on oxygen (5 to 10 L), pulmonary hypertension.  Patient is on nocturnal trilogy vent.  TEST/EVENTS :  PFTs October 2018, FEV1 46%, ratio 42, FVC 83%, no significant bronchodilator response, DLCO 24%. CT chest December 2019 - for PE, severe emphysema   01/05/2021 Follow up : COPD , O2 RF  Patient presents for a 68-monthfollow-up.  Patient has underlying very severe COPD that is oxygen dependent. Patient remains on Trelegy inhaler daily.  Has DuoNeb nebulizer she uses 2 times a day. Needs refill of this.   Gets short of breath with minimal activity and at rest at times.  Denies any flare of  increased cough or shortness of breath. She denies any hemoptysis.  Appetite is fair weight has been stable.   She remains on oxygen at 6 l/m at rest, 10 l/m with activity . No increased o2 demands  She is on trilogy vent at bedtime , does not use on consistent basis. Has [employer] coming out to check her machine, has been having difficulty with issues with alarms. [employer] is coming out this week to look at machine.   Is seeing a counselor for depression, going to see a new provider soon to look at changes in meds.   Lives at home alone. Has a helper with chores and travel at times. Does not drive much now . Has gotten electric wheelchair , that is helping her .  Previously on Palliative care but stopped due to Covid, would like to see if they would come back out .     Allergies  Allergen Reactions  . Aleve [Naproxen Sodium] Hives  . Aspirin Swelling    Facial swelling  . Penicillins Hives and Swelling    Has patient had a PCN reaction causing immediate rash, facial/tongue/throat swelling, SOB or lightheadedness with hypotension:  Yes Has patient had a PCN reaction causing severe rash involving mucus membranes or skin necrosis: Yes Has patient had a PCN reaction that required hospitalization: Unk Has patient had a PCN reaction occurring within the last 10 years: No If all of the above answers are "NO", then may proceed with Cephalosporin use.     Immunization History  Administered Date(s) Administered  . Fluad Quad(high Dose 65+) 07/10/2019, 07/09/2020  . Influenza, High Dose Seasonal PF 07/18/2016, 07/13/2017, 07/17/2018  . Influenza-Unspecified 07/18/2013  . Moderna SARS-COV2 Booster Vaccination 07/09/2020  . Moderna Sars-Covid-2 Vaccination 11/23/2019, 12/24/2019  . Pneumococcal Conjugate-13 07/13/2017  . Pneumococcal Polysaccharide-23 07/18/2016    Past Medical History:  Diagnosis Date  . Anxiety   . COPD (chronic obstructive pulmonary disease) ([place]   . Depression   . Hyperlipidemia   . Hypertension     Tobacco History: Social History   Tobacco Use  Smoking Status Former Smoker  . Packs/day: 1.50  . Years: 40.00  . Pack years: 60.00  . Types: Cigarettes  . Quit date: 03/18/2017  . Years since quitting: 3.8  Smokeless Tobacco Never Used   Counseling given: Not Answered   Outpatient Medications Prior to Visit  Medication Sig Dispense Refill  . acetaminophen (TYLENOL) 500 MG tablet Take 1,000 mg by mouth every 6 (six) hours as needed for moderate pain.     .Marland Kitchenalbuterol (VENTOLIN HFA)  108 (90 Base) MCG/ACT inhaler Inhale 2 puffs into the lungs every 6 (six) hours as needed for wheezing or shortness of breath. 18 g 5  . clonazePAM (KLONOPIN) 0.5 MG tablet Take 1 tablet (0.5 mg total) by mouth 2 (two) times daily as needed for anxiety. 10 tablet 0  . clopidogrel (PLAVIX) 75 MG tablet Take 1 tablet (75 mg total) by mouth daily. 30 tablet 0  . diphenhydrAMINE (BENADRYL) 25 mg capsule Take 25 mg by mouth every 6 (six) hours as needed for allergies.     [name] donepezil (ARICEPT) 5 MG tablet Take 5 mg by  mouth at bedtime.     . fluticasone (FLONASE) 50 MCG/ACT nasal spray Place 2 sprays into both nostrils daily. 48 g 1  . guaiFENesin (MUCINEX) 600 MG 12 hr tablet Take 1 tablet (600 mg total) by mouth 2 (two) times daily. 30 tablet 0  . loratadine (CLARITIN) 10 MG tablet Take 1 tablet (10 mg total) by mouth daily. (Patient taking differently: Take 10 mg by mouth daily as needed for allergies.) 30 tablet 0  . lovastatin (MEVACOR) 40 MG tablet Take 40 mg by mouth every morning.    [name] PARoxetine (PAXIL) 40 MG tablet Take 40 mg by mouth daily.     . sodium chloride (OCEAN) 0.65 % SOLN nasal spray Place 1 spray into both nostrils as needed for congestion.    . TRELEGY ELLIPTA 100-62.5-25 MCG/INH AEPB Inhale 1 puff into the lungs daily.     . valACYclovir (VALTREX) 1000 MG tablet     . prednisoLONE acetate (PRED FORTE) 1 % ophthalmic suspension Instill 1 drop in the right eye 4 times per day for 1 week (Patient not taking: Reported on 01/05/2021)    . ipratropium-albuterol (DUONEB) 0.5-2.5 (3) MG/3ML SOLN Take 3 mLs by nebulization daily. Dx: J44.9 (Patient not taking: Reported on 01/05/2021) 90 mL 5   No facility-administered medications prior to visit.     Review of Systems:   Constitutional:   No  weight loss, night sweats,  Fevers, chills, + fatigue, or  lassitude.  HEENT:   No headaches,  Difficulty swallowing,  Tooth/dental problems, or  Sore throat,                No sneezing, itching, ear ache, nasal congestion, post nasal drip,   CV:  No chest pain,  Orthopnea, PND, swelling in lower extremities, anasarca, dizziness, palpitations, syncope.   GI  No heartburn, indigestion, abdominal pain, nausea, vomiting, diarrhea, change in bowel habits, loss of appetite, bloody stools.   Resp:    No chest wall deformity  Skin: no rash or lesions.  GU: no dysuria, change in color of urine, no urgency or frequency.  No flank pain, no hematuria   MS:  No joint pain or swelling.  No decreased range of  motion.  No back pain.    Physical Exam  BP 128/68 (BP Location: Right Arm, Cuff Size: Normal)   Pulse 68   Temp (!) 97.3 F (36.3 C) (Temporal)   Ht _0  (1.575 m)   Wt 133 lb 6.4 oz (60.5 kg)   SpO2 98%   BMI 24.40 kg/m   GEN: A/Ox3; pleasant , NAD, elderly on O2 in wc    HEENT:  [place]/AT,   , NOSE-clear, THROAT-clear, no lesions, no postnasal drip or exudate noted.   NECK:  Supple w/ fair ROM; no JVD; normal carotid impulses w/o bruits; no thyromegaly or nodules palpated; no lymphadenopathy.  RESP  Clear  P & A; w/o, wheezes/ rales/ or rhonchi. no accessory muscle use, no dullness to percussion  CARD:  RRR, no m/r/g, tr  peripheral edema, pulses intact, no cyanosis or clubbing.  GI:   Soft & nt; nml bowel sounds; no organomegaly or masses detected.   Musco: Warm bil, no deformities or joint swelling noted.   Neuro: alert, no focal deficits noted.    Skin: Warm, no lesions or rashes     BNP  ProBNP No results found for: PROBNP  Imaging: No results found.    PFT Results Latest Ref Rng & Units 07/21/2017  FVC-Pre L 2.19  FVC-Predicted Pre % 86  FVC-Post L 2.11  FVC-Predicted Post % 83  Pre FEV1/FVC % % 42  Post FEV1/FCV % % 42  FEV1-Pre L 0.92  FEV1-Predicted Pre % 48  FEV1-Post L 0.89  DLCO uncorrected ml/min/mmHg 5.00  DLCO UNC% % 24  DLCO corrected ml/min/mmHg 4.97  DLCO COR %Predicted % 24  DLVA Predicted % 29  TLC L 5.32  TLC % Predicted % 115  RV % Predicted % 140    No results found for: NITRICOXIDE      Assessment & Plan:   COPD (chronic obstructive pulmonary disease) (HCC) Severe COPD with emphysema -requires high oxygen - patient has high symptom burden .  Lives alone, would benefit from palliative care   Plan  Patient Instructions  Continue on TRELEGY 1 puff daily , rinse after use.  Continue on Duoneb  As needed  - send refill to [facility] .  Continue on Oxygen 6l/m rest and 10 l/m with activity  Refer to Palliative care .   Activity as tolerated.  Follow up with Dr. Lamonte Sakai in 4 months and As needed   Please contact office for sooner follow up if symptoms do not improve or worsen or seek emergency care         Chronic respiratory failure with hypoxia ([place]) Compensated on present regimen   Plan  Patient Instructions  Continue on TRELEGY 1 puff daily , rinse after use.  Continue on Duoneb  As needed  - send refill to [place] .  Continue on Oxygen 6l/m rest and 10 l/m with activity  Refer to Palliative care .  Activity as tolerated.  Follow up with Dr. Lamonte Sakai in 4 months and As needed   Please contact office for sooner follow up if symptoms do not improve or worsen or seek emergency care         Depression Continue to follow with PCP and counselor s      Rexene Edison, NP 01/05/2021

## 2021-01-05 NOTE — Addendum Note (Signed)
Addended by: Mathis Bud on: 01/05/2021 11:54 AM   Modules accepted: Orders

## 2021-01-05 NOTE — Telephone Encounter (Signed)
Spoke with patient and scheduled an in-person Palliative Consult for 01/21/21 @ 2:00PM  COVID screening was negative. Patient does have a small dog in the home. Patient lives alone.  Consent obtained; updated Outlook/Netsmart/Team List and Epic.  Family is aware they may be receiving a call from NP the day before or day of to confirm appointment.

## 2021-01-05 NOTE — Assessment & Plan Note (Signed)
Continue to follow with PCP and counselor s

## 2021-01-05 NOTE — Assessment & Plan Note (Signed)
Compensated on present regimen   Plan  Patient Instructions  Continue on TRELEGY 1 puff daily , rinse after use.  Continue on Duoneb  As needed  - send refill to [place] .  Continue on Oxygen 6l/m rest and 10 l/m with activity  Refer to Palliative care .  Activity as tolerated.  Follow up with Dr. Lamonte Sakai in 4 months and As needed   Please contact office for sooner follow up if symptoms do not improve or worsen or seek emergency care

## 2021-01-05 NOTE — Patient Instructions (Signed)
Continue on TRELEGY 1 puff daily , rinse after use.  Continue on Duoneb  As needed  - send refill to [place] [facility] .  Continue on Oxygen 6l/m rest and 10 l/m with activity  Refer to Palliative care .  Activity as tolerated.  Follow up with Dr. Lamonte Sakai in 4 months and As needed   Please contact office for sooner follow up if symptoms do not improve or worsen or seek emergency care

## 2021-01-05 NOTE — Assessment & Plan Note (Signed)
Severe COPD with emphysema -requires high oxygen - patient has high symptom burden .  Lives alone, would benefit from palliative care   Plan  Patient Instructions  Continue on TRELEGY 1 puff daily , rinse after use.  Continue on Duoneb  As needed  - send refill to [facility] .  Continue on Oxygen 6l/m rest and 10 l/m with activity  Refer to Palliative care .  Activity as tolerated.  Follow up with Dr. Lamonte Sakai in 4 months and As needed   Please contact office for sooner follow up if symptoms do not improve or worsen or seek emergency care

## 2021-01-06 ENCOUNTER — Telehealth: Payer: Self-pay | Admitting: Adult Health

## 2021-01-06 MED ORDER — IPRATROPIUM-ALBUTEROL 0.5-2.5 (3) MG/3ML IN SOLN: 3.0000 mL | Freq: Two times a day (BID) | RESPIRATORY_TRACT | 6 refills | Status: DC

## 2021-01-06 NOTE — Telephone Encounter (Signed)
Refill has been sent to [facility] per pts request. Nothing further is needed.

## 2021-01-12 DIAGNOSIS — F4323 Adjustment disorder with mixed anxiety and depressed mood: Secondary | ICD-10-CM | POA: Diagnosis not present

## 2021-01-15 DIAGNOSIS — F4320 Adjustment disorder, unspecified: Secondary | ICD-10-CM | POA: Diagnosis not present

## 2021-01-21 ENCOUNTER — Telehealth: Payer: Self-pay | Admitting: Emergency Medicine

## 2021-01-21 ENCOUNTER — Other Ambulatory Visit: Payer: Medicare Other | Admitting: Nurse Practitioner

## 2021-01-21 ENCOUNTER — Other Ambulatory Visit: Payer: Self-pay

## 2021-01-21 DIAGNOSIS — J449 Chronic obstructive pulmonary disease, unspecified: Secondary | ICD-10-CM | POA: Diagnosis not present

## 2021-01-21 DIAGNOSIS — Z515 Encounter for palliative care: Secondary | ICD-10-CM | POA: Diagnosis not present

## 2021-01-21 NOTE — Telephone Encounter (Signed)
Dr. Lamonte Sakai please advise

## 2021-01-21 NOTE — Progress Notes (Addendum)
[facility] Consult Note Telephone: [phone]  Fax: [phone]  PATIENT NAME: Kayla Mack [address] [place] [address] [phone] (home)  DOB: 1945/12/23 MRN: [id number]  PRIMARY CARE PROVIDER:    Janie Morning, DO,  [address] [place] [address] [phone]  REFERRING PROVIDER:   Dr. Baltazar Apo (Pulmonologist)  RESPONSIBLE PARTY:   Extended Emergency Contact Information Primary Emergency Contact: [place], [place] Phone: [phone] Mobile Phone: [phone] Relation: Other Interpreter needed? No Secondary Emergency Contact: Higinio Plan Mobile Phone: [phone] Relation: Other Preferred language: English Interpreter needed? No  I met face to face with patient in home.  ASSESSMENT AND RECOMMENDATIONS:   CHIEF COMPLAINT: Dyspnea with activity  Advance Care Planning: Today's visit consisted of building trust and discussions on Palliative care medicine as a specialized medical care for people living with serious illness, aimed at facilitating improved quality of life through symptoms relief, assisting with advance care planning and establishing goals of care. Patient expressed appreciation for education provided on Palliative care and how it differs from Hospice service.  Patient report her mother had Hospice care at the end of her life but does not fully understand its philosophy. Goal of care: Patient's goal of care is comfort while preserving function. Directives: Patient's code status is DNR. She reiterated desire to not be resuscitated in the event of cardiac or respiratory arrest. Signed DNR present in home, copy uploaded to [facility] EMR.  I spent 30 minutes providing this consultation. More than 50% of the time in this consultation was spent in counseling and care  coordination.  ---------------------------------------------------------------------------------  Symptom Management:  Dyspnea: oxygen saturation dropped to 70s with slight activity, increased to 82% with deep breathing. No sign of acute distress noted. Patient report dyspnea has been ongoing in the last 2-3 weeks, negatively affecting her quality of life. She verbalized concern that she is now mostly unable to care for herself due to activity intolerance. She verbalized desire for assistance with transitioning into an Independent living facility or an Assisted living facility. She verbalized awareness that she may be nearing her end of life and wish someone would tell her how long she has to live. Patient made aware that with her current symptoms of increased oxygen use and dyspnea even with slight activity, her prognosis may likely be poor. We discussed natural disease trajectory of COPD and expectations at EOL. Patient made aware that COPD is a progressive disease and that her condition may not get better. Home Hospice services and philosophy were discussed and explained in detail. Patient expressed interest in home Hospice if deemed appropriate. I will reach out to her pulmonologist Dr. Lamonte Sakai to discuss her condition and prognosis.  Recommendation: Continue Trelegy Ellipta inhaler daily and Albuterol inhaler as needed. Continue oxygen supplementation at 7L, may increase up to 10L if needed. Continue routine deep breathing and purse lip breathing. Pace activities, avoid over-exertion. Would place social work referral to assist with placement in a facility per patient request. Major Depression: Ongoing, denied suicide or homicidal ideation. Report depression is related to current health condition, loneliness and personal regrets. Patient on Paxil 85m daily and Clonazepam 0.589mtwice a day as needed. She denied insomnia or loss of appetite.  Recommendation: continue current plan of care which include  regular sessions with psychotherapist. Provided general support and encouragement. Questions and concerns were addressed. Patient was encouraged to call with questions and/or concerns. My business card was provided.  Addendum 01/23/2021 Spoke with Rexene Edison, NP form Dr. Agustina Caroli office. Discussed visit findings and recommendation from palliative care consult. Tammy verbalized that she would call patient to further evaluate her condition.   Follow up Palliative Care Visit: Palliative care will continue to follow for complex decision making and symptom management. Return in about 2 weeks or prn. Will call patient tomorrow to review discussion with her pulmonologist.my  Family /Caregiver/Community Supports: Patient lives alone, she is a retired Materials engineer. She is divorced with no biological children. Her niece who is her POA, lives out of state. Has a cleaner that cleans her home every other week. Church family provides support.  Cognitive / Functional decline: Patient awake, alert and coherent. Able to complete her ADLs independently with ampule time. Ambulate without assistive. She still drives occassionally.   History obtained from review of EMR and discussion with patient. Records reviewed and summarized bellow.  HISTORY OF PRESENT ILLNESS:  Kayla Mack is a 75 y.o. year old female with multiple medical problems including severe COPD, chronic respiratory failure on oxygen (6-10L), pulmonary hypertension, major depression. Patient report ongoing dyspnea even with slight activity, condition worse in the last 2-3 weeks with her oxygen saturation dropping to the 70s. Endorsed resting and taking deep breaths makes it better. Report condition limits her activity as she finds it difficult to walk to her mail box or prolong standing. She denied fever, denied chills, denied productive cough, denied hemoptysis, denied chest pain. Denied any contact with a sick person, she is fully vaccinated against  Covid 19 virus. She is followed pulmonology, last visit was on 01/05/2021.  Palliative Care was asked to follow this patient by consultation request of Dr. Baltazar Apo to help address advance care planning and symptoms management. This is an initial visit.  Reviewed last PFTs October 2018, FEV1 46%, ratio 42, FVC 83%, no significant bronchodilator response, DLCO 24% Reviewed CT angio completed on 09/27/2018: no PE  CODE STATUS: DNR  PPS: 40%  HOSPICE ELIGIBILITY/DIAGNOSIS: TBD  ROS/staff/patient Constitutional: denies fever, denies chills, endorsed fatigue EYES: denies acute vision changes ENMT: denies dysphagia Cardiovascular: denies chest pain, denies palpitation Pulmonary: denies cough, denies increased SOB at rest, endorsed SOB with slight activity Abdomen: endorses fair appetite, denies constipation, denies incontinence of bowel GU: denies dysuria, denies incontinence of urine MSK:  denies ROM limitations, no falls reported Skin: denies rashes or wounds Neurological: endorses weakness, denies uncontrolled pain, denies insomnia Psych: Endorsed depression, denied suicide or homicidal ideation Heme/lymph/immuno: denies bruises, abnormal bleeding   Physical Exam: Vital Signs: BP 148/98, P 99, RR 20, 79% on 7L Current and past weights: 133lbs, Ht 6f2", BMI 24.4kg Constitutional: NAD General: chronically ill and frail appearing, thin EYES: anicteric sclera, no discharge  ENMT: hard of hearing, oral mucous membranes moist CV:  no LE edema Pulmonary: no increased work of breathing, no cough, no audible wheezes, on supplemental oxygen Abdomen: no ascites GU: deferred MSK: Move extremities, ambulatory Skin: warm and dry, no rashes or wounds on visible skin Neuro: Generalized weakness Psych: non-anxious affect today, A and O x 4 Hem/lymph/immuno: no widespread bruising   PAST MEDICAL HISTORY:  Past Medical History:  Diagnosis Date  . Anxiety   . COPD (chronic obstructive  pulmonary disease) ([facility]   . Depression   . Hyperlipidemia   . Hypertension     SOCIAL HX:  Social History   Tobacco Use  . Smoking status: Former Smoker    Packs/day: 1.50    Years:  40.00    Pack years: 60.00    Types: Cigarettes    Quit date: 03/18/2017    Years since quitting: 3.8  . Smokeless tobacco: Never Used  Substance Use Topics  . Alcohol use: Yes    Comment: liquor    FAMILY HX:  Family History  Problem Relation Age of Onset  . Heart disease Mother   . Heart failure Mother   . Heart disease Father        cabg  . Heart disease Sister   . Diabetes Sister     ALLERGIES:  Allergies  Allergen Reactions  . Aleve [Naproxen Sodium] Hives  . Aspirin Swelling    Facial swelling  . Penicillins Hives and Swelling    Has patient had a PCN reaction causing immediate rash, facial/tongue/throat swelling, SOB or lightheadedness with hypotension: Yes Has patient had a PCN reaction causing severe rash involving mucus membranes or skin necrosis: Yes Has patient had a PCN reaction that required hospitalization: Unk Has patient had a PCN reaction occurring within the last 10 years: No If all of the above answers are "NO", then may proceed with Cephalosporin use.      PERTINENT MEDICATIONS:  Outpatient Encounter Medications as of 01/21/2021  Medication Sig  . acetaminophen (TYLENOL) 500 MG tablet Take 1,000 mg by mouth every 6 (six) hours as needed for moderate pain.   [name] albuterol (PROVENTIL) (2.5 MG/3ML) 0.083% nebulizer solution Take 3 mLs (2.5 mg total) by nebulization every 6 (six) hours as needed for wheezing or shortness of breath.  [name] albuterol (VENTOLIN HFA) 108 (90 Base) MCG/ACT inhaler Inhale 2 puffs into the lungs every 6 (six) hours as needed for wheezing or shortness of breath.  . clonazePAM (KLONOPIN) 0.5 MG tablet Take 1 tablet (0.5 mg total) by mouth 2 (two) times daily as needed for anxiety.  . clopidogrel (PLAVIX) 75 MG tablet Take 1 tablet (75 mg total) by mouth  daily.  . diphenhydrAMINE (BENADRYL) 25 mg capsule Take 25 mg by mouth every 6 (six) hours as needed for allergies.   [name] donepezil (ARICEPT) 5 MG tablet Take 5 mg by mouth at bedtime.   . fluticasone (FLONASE) 50 MCG/ACT nasal spray Place 2 sprays into both nostrils daily.  [name] guaiFENesin (MUCINEX) 600 MG 12 hr tablet Take 1 tablet (600 mg total) by mouth 2 (two) times daily.  [name] ipratropium-albuterol (DUONEB) 0.5-2.5 (3) MG/3ML SOLN Take 3 mLs by nebulization in the morning and at bedtime.  [name] loratadine (CLARITIN) 10 MG tablet Take 1 tablet (10 mg total) by mouth daily. (Patient taking differently: Take 10 mg by mouth daily as needed for allergies.)  . lovastatin (MEVACOR) 40 MG tablet Take 40 mg by mouth every morning.  [name] PARoxetine (PAXIL) 40 MG tablet Take 40 mg by mouth daily.   . prednisoLONE acetate (PRED FORTE) 1 % ophthalmic suspension Instill 1 drop in the right eye 4 times per day for 1 week (Patient not taking: Reported on 01/05/2021)  . sodium chloride (OCEAN) 0.65 % SOLN nasal spray Place 1 spray into both nostrils as needed for congestion.  . TRELEGY ELLIPTA 100-62.5-25 MCG/INH AEPB Inhale 1 puff into the lungs daily.   . valACYclovir (VALTREX) 1000 MG tablet    No facility-administered encounter medications on file as of 01/21/2021.    Thank you for the opportunity to participate in the care of Ms. Luvinia Lucy. The palliative care team will continue to follow. Please call our office at [phone] if we can  be of additional assistance.  Jari Favre, DNP, AGPCNP-BC

## 2021-01-22 DIAGNOSIS — F4323 Adjustment disorder with mixed anxiety and depressed mood: Secondary | ICD-10-CM | POA: Diagnosis not present

## 2021-01-23 ENCOUNTER — Telehealth: Payer: Self-pay

## 2021-01-23 ENCOUNTER — Telehealth: Payer: Self-pay | Admitting: Emergency Medicine

## 2021-01-23 ENCOUNTER — Telehealth: Payer: Self-pay | Admitting: Adult Health

## 2021-01-23 DIAGNOSIS — J9611 Chronic respiratory failure with hypoxia: Secondary | ICD-10-CM

## 2021-01-23 DIAGNOSIS — I272 Pulmonary hypertension, unspecified: Secondary | ICD-10-CM
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
## 2021-01-23 NOTE — Telephone Encounter (Signed)
Referral placed for pt to be transitioned over to Hospice.

## 2021-01-23 NOTE — Telephone Encounter (Signed)
(  2:53p) Palliative Care  SW completed follow-up call to patient and spoke with her directly regarding her desire to move to an assisted living. SW provided education regarding difference in independent and assisted living levels of care. SW also provided the names of four assisted living facilities ([facility], [facility], [facility] and [facility]) to call and get additional information about the cost and if animals are allowed as she has a dog. Patient explained that she is having increased breathing difficulty which has impacted her ability to complete ADL's. She has ordered an electric scooter to ambulate and she is hoping this will help conserve her air and energy. SW advised patient that she will be out of the office next week, but will follow-up with her once she returns. SW advised that this should be enough time to follow-up with the facilities and we could discuss it further and plan next steps at that time.   *NP updated for follow-up.

## 2021-01-27 DIAGNOSIS — F4323 Adjustment disorder with mixed anxiety and depressed mood: Secondary | ICD-10-CM | POA: Diagnosis not present

## 2021-01-27 NOTE — Telephone Encounter (Signed)
Nothing noted in message. Will close encounter.  

## 2021-01-28 DIAGNOSIS — Z9981 Dependence on supplemental oxygen: Secondary | ICD-10-CM | POA: Diagnosis not present

## 2021-01-28 DIAGNOSIS — I270 Primary pulmonary hypertension: Secondary | ICD-10-CM | POA: Diagnosis not present

## 2021-01-28 DIAGNOSIS — E785 Hyperlipidemia, unspecified: Secondary | ICD-10-CM | POA: Diagnosis not present

## 2021-01-28 DIAGNOSIS — J9610 Chronic respiratory failure, unspecified whether with hypoxia or hypercapnia: Secondary | ICD-10-CM | POA: Diagnosis not present

## 2021-01-28 DIAGNOSIS — J449 Chronic obstructive pulmonary disease, unspecified: Secondary | ICD-10-CM | POA: Diagnosis not present

## 2021-01-28 DIAGNOSIS — I10 Essential (primary) hypertension: Secondary | ICD-10-CM | POA: Diagnosis not present

## 2021-01-28 DIAGNOSIS — K219 Gastro-esophageal reflux disease without esophagitis: Secondary | ICD-10-CM | POA: Diagnosis not present

## 2021-01-28 DIAGNOSIS — F329 Major depressive disorder, single episode, unspecified: Secondary | ICD-10-CM | POA: Diagnosis not present

## 2021-01-28 DIAGNOSIS — J302 Other seasonal allergic rhinitis: Secondary | ICD-10-CM | POA: Diagnosis not present

## 2021-01-30 DIAGNOSIS — E785 Hyperlipidemia, unspecified: Secondary | ICD-10-CM | POA: Diagnosis not present

## 2021-01-30 DIAGNOSIS — J9610 Chronic respiratory failure, unspecified whether with hypoxia or hypercapnia: Secondary | ICD-10-CM | POA: Diagnosis not present

## 2021-01-30 DIAGNOSIS — I270 Primary pulmonary hypertension: Secondary | ICD-10-CM | POA: Diagnosis not present

## 2021-01-30 DIAGNOSIS — F329 Major depressive disorder, single episode, unspecified: Secondary | ICD-10-CM | POA: Diagnosis not present

## 2021-01-30 DIAGNOSIS — I10 Essential (primary) hypertension: Secondary | ICD-10-CM | POA: Diagnosis not present

## 2021-01-30 DIAGNOSIS — J449 Chronic obstructive pulmonary disease, unspecified: Secondary | ICD-10-CM | POA: Diagnosis not present

## 2021-02-04 DIAGNOSIS — F4323 Adjustment disorder with mixed anxiety and depressed mood: Secondary | ICD-10-CM | POA: Diagnosis not present

## 2021-02-06 DIAGNOSIS — I270 Primary pulmonary hypertension: Secondary | ICD-10-CM | POA: Diagnosis not present

## 2021-02-06 DIAGNOSIS — J9610 Chronic respiratory failure, unspecified whether with hypoxia or hypercapnia: Secondary | ICD-10-CM | POA: Diagnosis not present

## 2021-02-06 DIAGNOSIS — F329 Major depressive disorder, single episode, unspecified: Secondary | ICD-10-CM | POA: Diagnosis not present

## 2021-02-06 DIAGNOSIS — J449 Chronic obstructive pulmonary disease, unspecified: Secondary | ICD-10-CM | POA: Diagnosis not present

## 2021-02-06 DIAGNOSIS — E785 Hyperlipidemia, unspecified: Secondary | ICD-10-CM | POA: Diagnosis not present

## 2021-02-06 DIAGNOSIS — F4323 Adjustment disorder with mixed anxiety and depressed mood: Secondary | ICD-10-CM | POA: Diagnosis not present

## 2021-02-06 DIAGNOSIS — I10 Essential (primary) hypertension: Secondary | ICD-10-CM | POA: Diagnosis not present

## 2021-02-13 DIAGNOSIS — F329 Major depressive disorder, single episode, unspecified: Secondary | ICD-10-CM | POA: Diagnosis not present

## 2021-02-13 DIAGNOSIS — I10 Essential (primary) hypertension: Secondary | ICD-10-CM | POA: Diagnosis not present

## 2021-02-13 DIAGNOSIS — J449 Chronic obstructive pulmonary disease, unspecified: Secondary | ICD-10-CM | POA: Diagnosis not present

## 2021-02-13 DIAGNOSIS — I270 Primary pulmonary hypertension: Secondary | ICD-10-CM | POA: Diagnosis not present

## 2021-02-13 DIAGNOSIS — J9610 Chronic respiratory failure, unspecified whether with hypoxia or hypercapnia: Secondary | ICD-10-CM | POA: Diagnosis not present

## 2021-02-13 DIAGNOSIS — E785 Hyperlipidemia, unspecified: Secondary | ICD-10-CM | POA: Diagnosis not present

## 2021-02-15 DIAGNOSIS — J9610 Chronic respiratory failure, unspecified whether with hypoxia or hypercapnia: Secondary | ICD-10-CM | POA: Diagnosis not present

## 2021-02-15 DIAGNOSIS — I10 Essential (primary) hypertension: Secondary | ICD-10-CM | POA: Diagnosis not present

## 2021-02-15 DIAGNOSIS — Z9981 Dependence on supplemental oxygen: Secondary | ICD-10-CM | POA: Diagnosis not present

## 2021-02-15 DIAGNOSIS — K219 Gastro-esophageal reflux disease without esophagitis: Secondary | ICD-10-CM | POA: Diagnosis not present

## 2021-02-15 DIAGNOSIS — J302 Other seasonal allergic rhinitis: Secondary | ICD-10-CM | POA: Diagnosis not present

## 2021-02-15 DIAGNOSIS — I270 Primary pulmonary hypertension: Secondary | ICD-10-CM | POA: Diagnosis not present

## 2021-02-15 DIAGNOSIS — F329 Major depressive disorder, single episode, unspecified: Secondary | ICD-10-CM | POA: Diagnosis not present

## 2021-02-15 DIAGNOSIS — J449 Chronic obstructive pulmonary disease, unspecified: Secondary | ICD-10-CM | POA: Diagnosis not present

## 2021-02-15 DIAGNOSIS — E785 Hyperlipidemia, unspecified: Secondary | ICD-10-CM | POA: Diagnosis not present

## 2021-02-19 DIAGNOSIS — J449 Chronic obstructive pulmonary disease, unspecified: Secondary | ICD-10-CM | POA: Diagnosis not present

## 2021-02-19 DIAGNOSIS — E785 Hyperlipidemia, unspecified: Secondary | ICD-10-CM | POA: Diagnosis not present

## 2021-02-19 DIAGNOSIS — J9610 Chronic respiratory failure, unspecified whether with hypoxia or hypercapnia: Secondary | ICD-10-CM | POA: Diagnosis not present

## 2021-02-19 DIAGNOSIS — F329 Major depressive disorder, single episode, unspecified: Secondary | ICD-10-CM | POA: Diagnosis not present

## 2021-02-19 DIAGNOSIS — I270 Primary pulmonary hypertension: Secondary | ICD-10-CM | POA: Diagnosis not present

## 2021-02-19 DIAGNOSIS — I10 Essential (primary) hypertension: Secondary | ICD-10-CM | POA: Diagnosis not present

## 2021-02-23 DIAGNOSIS — I270 Primary pulmonary hypertension: Secondary | ICD-10-CM | POA: Diagnosis not present

## 2021-02-23 DIAGNOSIS — F329 Major depressive disorder, single episode, unspecified: Secondary | ICD-10-CM | POA: Diagnosis not present

## 2021-02-23 DIAGNOSIS — J449 Chronic obstructive pulmonary disease, unspecified: Secondary | ICD-10-CM | POA: Diagnosis not present

## 2021-02-23 DIAGNOSIS — I10 Essential (primary) hypertension: Secondary | ICD-10-CM | POA: Diagnosis not present

## 2021-02-23 DIAGNOSIS — E785 Hyperlipidemia, unspecified: Secondary | ICD-10-CM | POA: Diagnosis not present

## 2021-02-23 DIAGNOSIS — J9610 Chronic respiratory failure, unspecified whether with hypoxia or hypercapnia: Secondary | ICD-10-CM | POA: Diagnosis not present

## 2021-02-25 DIAGNOSIS — F4323 Adjustment disorder with mixed anxiety and depressed mood: Secondary | ICD-10-CM | POA: Diagnosis not present

## 2021-02-26 DIAGNOSIS — J449 Chronic obstructive pulmonary disease, unspecified: Secondary | ICD-10-CM | POA: Diagnosis not present

## 2021-02-26 DIAGNOSIS — F329 Major depressive disorder, single episode, unspecified: Secondary | ICD-10-CM | POA: Diagnosis not present

## 2021-02-26 DIAGNOSIS — J9610 Chronic respiratory failure, unspecified whether with hypoxia or hypercapnia: Secondary | ICD-10-CM | POA: Diagnosis not present

## 2021-02-26 DIAGNOSIS — I10 Essential (primary) hypertension: Secondary | ICD-10-CM | POA: Diagnosis not present

## 2021-02-26 DIAGNOSIS — I270 Primary pulmonary hypertension: Secondary | ICD-10-CM | POA: Diagnosis not present

## 2021-02-26 DIAGNOSIS — E785 Hyperlipidemia, unspecified: Secondary | ICD-10-CM | POA: Diagnosis not present

## 2021-02-27 DIAGNOSIS — F4323 Adjustment disorder with mixed anxiety and depressed mood: Secondary | ICD-10-CM | POA: Diagnosis not present

## 2021-03-04 DIAGNOSIS — Z23 Encounter for immunization: Secondary | ICD-10-CM | POA: Diagnosis not present

## 2021-03-05 DIAGNOSIS — F4323 Adjustment disorder with mixed anxiety and depressed mood: Secondary | ICD-10-CM | POA: Diagnosis not present

## 2021-03-05 DIAGNOSIS — F329 Major depressive disorder, single episode, unspecified: Secondary | ICD-10-CM | POA: Diagnosis not present

## 2021-03-05 DIAGNOSIS — J9610 Chronic respiratory failure, unspecified whether with hypoxia or hypercapnia: Secondary | ICD-10-CM | POA: Diagnosis not present

## 2021-03-05 DIAGNOSIS — J449 Chronic obstructive pulmonary disease, unspecified: Secondary | ICD-10-CM | POA: Diagnosis not present

## 2021-03-05 DIAGNOSIS — I10 Essential (primary) hypertension: Secondary | ICD-10-CM | POA: Diagnosis not present

## 2021-03-05 DIAGNOSIS — I270 Primary pulmonary hypertension: Secondary | ICD-10-CM | POA: Diagnosis not present

## 2021-03-05 DIAGNOSIS — E785 Hyperlipidemia, unspecified: Secondary | ICD-10-CM | POA: Diagnosis not present

## 2021-03-06 DIAGNOSIS — E559 Vitamin D deficiency, unspecified: Secondary | ICD-10-CM | POA: Diagnosis not present

## 2021-03-06 DIAGNOSIS — E7800 Pure hypercholesterolemia, unspecified: Secondary | ICD-10-CM | POA: Diagnosis not present

## 2021-03-06 DIAGNOSIS — R946 Abnormal results of thyroid function studies: Secondary | ICD-10-CM | POA: Diagnosis not present

## 2021-03-06 DIAGNOSIS — I270 Primary pulmonary hypertension: Secondary | ICD-10-CM | POA: Diagnosis not present

## 2021-03-06 DIAGNOSIS — F329 Major depressive disorder, single episode, unspecified: Secondary | ICD-10-CM | POA: Diagnosis not present

## 2021-03-06 DIAGNOSIS — I10 Essential (primary) hypertension: Secondary | ICD-10-CM | POA: Diagnosis not present

## 2021-03-06 DIAGNOSIS — R7309 Other abnormal glucose: Secondary | ICD-10-CM | POA: Diagnosis not present

## 2021-03-06 DIAGNOSIS — J449 Chronic obstructive pulmonary disease, unspecified: Secondary | ICD-10-CM | POA: Diagnosis not present

## 2021-03-06 DIAGNOSIS — E785 Hyperlipidemia, unspecified: Secondary | ICD-10-CM | POA: Diagnosis not present

## 2021-03-06 DIAGNOSIS — J9610 Chronic respiratory failure, unspecified whether with hypoxia or hypercapnia: Secondary | ICD-10-CM | POA: Diagnosis not present

## 2021-03-06 DIAGNOSIS — F4323 Adjustment disorder with mixed anxiety and depressed mood: Secondary | ICD-10-CM | POA: Diagnosis not present

## 2021-03-09 DIAGNOSIS — J449 Chronic obstructive pulmonary disease, unspecified: Secondary | ICD-10-CM | POA: Diagnosis not present

## 2021-03-09 DIAGNOSIS — J9610 Chronic respiratory failure, unspecified whether with hypoxia or hypercapnia: Secondary | ICD-10-CM | POA: Diagnosis not present

## 2021-03-09 DIAGNOSIS — I10 Essential (primary) hypertension: Secondary | ICD-10-CM | POA: Diagnosis not present

## 2021-03-09 DIAGNOSIS — E785 Hyperlipidemia, unspecified: Secondary | ICD-10-CM | POA: Diagnosis not present

## 2021-03-10 DIAGNOSIS — J449 Chronic obstructive pulmonary disease, unspecified: Secondary | ICD-10-CM | POA: Diagnosis not present

## 2021-03-10 DIAGNOSIS — I270 Primary pulmonary hypertension: Secondary | ICD-10-CM | POA: Diagnosis not present

## 2021-03-10 DIAGNOSIS — I10 Essential (primary) hypertension: Secondary | ICD-10-CM | POA: Diagnosis not present

## 2021-03-10 DIAGNOSIS — J9610 Chronic respiratory failure, unspecified whether with hypoxia or hypercapnia: Secondary | ICD-10-CM | POA: Diagnosis not present

## 2021-03-10 DIAGNOSIS — E785 Hyperlipidemia, unspecified: Secondary | ICD-10-CM | POA: Diagnosis not present

## 2021-03-10 DIAGNOSIS — F329 Major depressive disorder, single episode, unspecified: Secondary | ICD-10-CM | POA: Diagnosis not present

## 2021-03-10 DIAGNOSIS — F4323 Adjustment disorder with mixed anxiety and depressed mood: Secondary | ICD-10-CM | POA: Diagnosis not present

## 2021-03-12 DIAGNOSIS — J9610 Chronic respiratory failure, unspecified whether with hypoxia or hypercapnia: Secondary | ICD-10-CM | POA: Diagnosis not present

## 2021-03-12 DIAGNOSIS — I270 Primary pulmonary hypertension: Secondary | ICD-10-CM | POA: Diagnosis not present

## 2021-03-12 DIAGNOSIS — J449 Chronic obstructive pulmonary disease, unspecified: Secondary | ICD-10-CM | POA: Diagnosis not present

## 2021-03-12 DIAGNOSIS — F329 Major depressive disorder, single episode, unspecified: Secondary | ICD-10-CM | POA: Diagnosis not present

## 2021-03-12 DIAGNOSIS — E785 Hyperlipidemia, unspecified: Secondary | ICD-10-CM | POA: Diagnosis not present

## 2021-03-12 DIAGNOSIS — I10 Essential (primary) hypertension: Secondary | ICD-10-CM | POA: Diagnosis not present

## 2021-03-18 DIAGNOSIS — Z9981 Dependence on supplemental oxygen: Secondary | ICD-10-CM | POA: Diagnosis not present

## 2021-03-18 DIAGNOSIS — I270 Primary pulmonary hypertension: Secondary | ICD-10-CM | POA: Diagnosis not present

## 2021-03-18 DIAGNOSIS — K219 Gastro-esophageal reflux disease without esophagitis: Secondary | ICD-10-CM | POA: Diagnosis not present

## 2021-03-18 DIAGNOSIS — J449 Chronic obstructive pulmonary disease, unspecified: Secondary | ICD-10-CM | POA: Diagnosis not present

## 2021-03-18 DIAGNOSIS — I10 Essential (primary) hypertension: Secondary | ICD-10-CM | POA: Diagnosis not present

## 2021-03-18 DIAGNOSIS — E785 Hyperlipidemia, unspecified: Secondary | ICD-10-CM | POA: Diagnosis not present

## 2021-03-18 DIAGNOSIS — F329 Major depressive disorder, single episode, unspecified: Secondary | ICD-10-CM | POA: Diagnosis not present

## 2021-03-18 DIAGNOSIS — J302 Other seasonal allergic rhinitis: Secondary | ICD-10-CM | POA: Diagnosis not present

## 2021-03-18 DIAGNOSIS — J9610 Chronic respiratory failure, unspecified whether with hypoxia or hypercapnia: Secondary | ICD-10-CM | POA: Diagnosis not present

## 2021-03-19 DIAGNOSIS — F329 Major depressive disorder, single episode, unspecified: Secondary | ICD-10-CM | POA: Diagnosis not present

## 2021-03-19 DIAGNOSIS — J449 Chronic obstructive pulmonary disease, unspecified: Secondary | ICD-10-CM | POA: Diagnosis not present

## 2021-03-19 DIAGNOSIS — Z9989 Dependence on other enabling machines and devices: Secondary | ICD-10-CM | POA: Diagnosis not present

## 2021-03-19 DIAGNOSIS — I10 Essential (primary) hypertension: Secondary | ICD-10-CM | POA: Diagnosis not present

## 2021-03-19 DIAGNOSIS — F419 Anxiety disorder, unspecified: Secondary | ICD-10-CM | POA: Diagnosis not present

## 2021-03-19 DIAGNOSIS — M810 Age-related osteoporosis without current pathological fracture: Secondary | ICD-10-CM | POA: Diagnosis not present

## 2021-03-19 DIAGNOSIS — E7800 Pure hypercholesterolemia, unspecified: Secondary | ICD-10-CM | POA: Diagnosis not present

## 2021-03-19 DIAGNOSIS — Z0000 Encounter for general adult medical examination without abnormal findings: Secondary | ICD-10-CM | POA: Diagnosis not present

## 2021-03-19 DIAGNOSIS — Z9981 Dependence on supplemental oxygen: Secondary | ICD-10-CM | POA: Diagnosis not present

## 2021-03-20 DIAGNOSIS — J449 Chronic obstructive pulmonary disease, unspecified: Secondary | ICD-10-CM | POA: Diagnosis not present

## 2021-03-20 DIAGNOSIS — E785 Hyperlipidemia, unspecified: Secondary | ICD-10-CM | POA: Diagnosis not present

## 2021-03-20 DIAGNOSIS — I10 Essential (primary) hypertension: Secondary | ICD-10-CM | POA: Diagnosis not present

## 2021-03-20 DIAGNOSIS — F329 Major depressive disorder, single episode, unspecified: Secondary | ICD-10-CM | POA: Diagnosis not present

## 2021-03-20 DIAGNOSIS — J9610 Chronic respiratory failure, unspecified whether with hypoxia or hypercapnia: Secondary | ICD-10-CM | POA: Diagnosis not present

## 2021-03-20 DIAGNOSIS — I270 Primary pulmonary hypertension: Secondary | ICD-10-CM | POA: Diagnosis not present

## 2021-03-23 DIAGNOSIS — E785 Hyperlipidemia, unspecified: Secondary | ICD-10-CM | POA: Diagnosis not present

## 2021-03-23 DIAGNOSIS — J449 Chronic obstructive pulmonary disease, unspecified: Secondary | ICD-10-CM | POA: Diagnosis not present

## 2021-03-23 DIAGNOSIS — J9610 Chronic respiratory failure, unspecified whether with hypoxia or hypercapnia: Secondary | ICD-10-CM | POA: Diagnosis not present

## 2021-03-23 DIAGNOSIS — I10 Essential (primary) hypertension: Secondary | ICD-10-CM | POA: Diagnosis not present

## 2021-03-23 DIAGNOSIS — I270 Primary pulmonary hypertension: Secondary | ICD-10-CM | POA: Diagnosis not present

## 2021-03-23 DIAGNOSIS — F329 Major depressive disorder, single episode, unspecified: Secondary | ICD-10-CM | POA: Diagnosis not present

## 2021-03-27 DIAGNOSIS — F329 Major depressive disorder, single episode, unspecified: Secondary | ICD-10-CM | POA: Diagnosis not present

## 2021-03-27 DIAGNOSIS — J9610 Chronic respiratory failure, unspecified whether with hypoxia or hypercapnia: Secondary | ICD-10-CM | POA: Diagnosis not present

## 2021-03-27 DIAGNOSIS — E785 Hyperlipidemia, unspecified: Secondary | ICD-10-CM | POA: Diagnosis not present

## 2021-03-27 DIAGNOSIS — I270 Primary pulmonary hypertension: Secondary | ICD-10-CM | POA: Diagnosis not present

## 2021-03-27 DIAGNOSIS — I10 Essential (primary) hypertension: Secondary | ICD-10-CM | POA: Diagnosis not present

## 2021-03-27 DIAGNOSIS — J449 Chronic obstructive pulmonary disease, unspecified: Secondary | ICD-10-CM | POA: Diagnosis not present

## 2021-03-31 DIAGNOSIS — F4323 Adjustment disorder with mixed anxiety and depressed mood: Secondary | ICD-10-CM | POA: Diagnosis not present

## 2021-04-03 DIAGNOSIS — I270 Primary pulmonary hypertension: Secondary | ICD-10-CM | POA: Diagnosis not present

## 2021-04-03 DIAGNOSIS — J9610 Chronic respiratory failure, unspecified whether with hypoxia or hypercapnia: Secondary | ICD-10-CM | POA: Diagnosis not present

## 2021-04-03 DIAGNOSIS — I10 Essential (primary) hypertension: Secondary | ICD-10-CM | POA: Diagnosis not present

## 2021-04-03 DIAGNOSIS — J449 Chronic obstructive pulmonary disease, unspecified: Secondary | ICD-10-CM | POA: Diagnosis not present

## 2021-04-03 DIAGNOSIS — E785 Hyperlipidemia, unspecified: Secondary | ICD-10-CM | POA: Diagnosis not present

## 2021-04-03 DIAGNOSIS — F329 Major depressive disorder, single episode, unspecified: Secondary | ICD-10-CM | POA: Diagnosis not present

## 2021-04-06 DIAGNOSIS — F4323 Adjustment disorder with mixed anxiety and depressed mood: Secondary | ICD-10-CM | POA: Diagnosis not present

## 2021-04-07 DIAGNOSIS — J449 Chronic obstructive pulmonary disease, unspecified: Secondary | ICD-10-CM | POA: Diagnosis not present

## 2021-04-07 DIAGNOSIS — F329 Major depressive disorder, single episode, unspecified: Secondary | ICD-10-CM | POA: Diagnosis not present

## 2021-04-07 DIAGNOSIS — I270 Primary pulmonary hypertension: Secondary | ICD-10-CM | POA: Diagnosis not present

## 2021-04-07 DIAGNOSIS — I10 Essential (primary) hypertension: Secondary | ICD-10-CM | POA: Diagnosis not present

## 2021-04-07 DIAGNOSIS — J9610 Chronic respiratory failure, unspecified whether with hypoxia or hypercapnia: Secondary | ICD-10-CM | POA: Diagnosis not present

## 2021-04-07 DIAGNOSIS — E785 Hyperlipidemia, unspecified: Secondary | ICD-10-CM | POA: Diagnosis not present

## 2021-04-10 DIAGNOSIS — J9610 Chronic respiratory failure, unspecified whether with hypoxia or hypercapnia: Secondary | ICD-10-CM | POA: Diagnosis not present

## 2021-04-10 DIAGNOSIS — I10 Essential (primary) hypertension: Secondary | ICD-10-CM | POA: Diagnosis not present

## 2021-04-10 DIAGNOSIS — E785 Hyperlipidemia, unspecified: Secondary | ICD-10-CM | POA: Diagnosis not present

## 2021-04-10 DIAGNOSIS — J449 Chronic obstructive pulmonary disease, unspecified: Secondary | ICD-10-CM | POA: Diagnosis not present

## 2021-04-10 DIAGNOSIS — I270 Primary pulmonary hypertension: Secondary | ICD-10-CM | POA: Diagnosis not present

## 2021-04-10 DIAGNOSIS — F329 Major depressive disorder, single episode, unspecified: Secondary | ICD-10-CM | POA: Diagnosis not present

## 2021-04-15 DIAGNOSIS — F4323 Adjustment disorder with mixed anxiety and depressed mood: Secondary | ICD-10-CM | POA: Diagnosis not present

## 2021-04-16 DIAGNOSIS — I10 Essential (primary) hypertension: Secondary | ICD-10-CM | POA: Diagnosis not present

## 2021-04-16 DIAGNOSIS — J9610 Chronic respiratory failure, unspecified whether with hypoxia or hypercapnia: Secondary | ICD-10-CM | POA: Diagnosis not present

## 2021-04-16 DIAGNOSIS — E785 Hyperlipidemia, unspecified: Secondary | ICD-10-CM | POA: Diagnosis not present

## 2021-04-16 DIAGNOSIS — F329 Major depressive disorder, single episode, unspecified: Secondary | ICD-10-CM | POA: Diagnosis not present

## 2021-04-16 DIAGNOSIS — J449 Chronic obstructive pulmonary disease, unspecified: Secondary | ICD-10-CM | POA: Diagnosis not present

## 2021-04-16 DIAGNOSIS — I270 Primary pulmonary hypertension: Secondary | ICD-10-CM | POA: Diagnosis not present

## 2021-04-17 DIAGNOSIS — Z9981 Dependence on supplemental oxygen: Secondary | ICD-10-CM | POA: Diagnosis not present

## 2021-04-17 DIAGNOSIS — I10 Essential (primary) hypertension: Secondary | ICD-10-CM | POA: Diagnosis not present

## 2021-04-17 DIAGNOSIS — K219 Gastro-esophageal reflux disease without esophagitis: Secondary | ICD-10-CM | POA: Diagnosis not present

## 2021-04-17 DIAGNOSIS — J9610 Chronic respiratory failure, unspecified whether with hypoxia or hypercapnia: Secondary | ICD-10-CM | POA: Diagnosis not present

## 2021-04-17 DIAGNOSIS — E785 Hyperlipidemia, unspecified: Secondary | ICD-10-CM | POA: Diagnosis not present

## 2021-04-17 DIAGNOSIS — F329 Major depressive disorder, single episode, unspecified: Secondary | ICD-10-CM | POA: Diagnosis not present

## 2021-04-17 DIAGNOSIS — J302 Other seasonal allergic rhinitis: Secondary | ICD-10-CM | POA: Diagnosis not present

## 2021-04-17 DIAGNOSIS — J449 Chronic obstructive pulmonary disease, unspecified: Secondary | ICD-10-CM | POA: Diagnosis not present

## 2021-04-17 DIAGNOSIS — I270 Primary pulmonary hypertension: Secondary | ICD-10-CM | POA: Diagnosis not present

## 2021-04-24 DIAGNOSIS — F329 Major depressive disorder, single episode, unspecified: Secondary | ICD-10-CM | POA: Diagnosis not present

## 2021-04-24 DIAGNOSIS — E785 Hyperlipidemia, unspecified: Secondary | ICD-10-CM | POA: Diagnosis not present

## 2021-04-24 DIAGNOSIS — J449 Chronic obstructive pulmonary disease, unspecified: Secondary | ICD-10-CM | POA: Diagnosis not present

## 2021-04-24 DIAGNOSIS — I10 Essential (primary) hypertension: Secondary | ICD-10-CM | POA: Diagnosis not present

## 2021-04-24 DIAGNOSIS — J9610 Chronic respiratory failure, unspecified whether with hypoxia or hypercapnia: Secondary | ICD-10-CM | POA: Diagnosis not present

## 2021-04-24 DIAGNOSIS — I270 Primary pulmonary hypertension: Secondary | ICD-10-CM | POA: Diagnosis not present

## 2021-04-27 DIAGNOSIS — I270 Primary pulmonary hypertension: Secondary | ICD-10-CM | POA: Diagnosis not present

## 2021-04-27 DIAGNOSIS — F329 Major depressive disorder, single episode, unspecified: Secondary | ICD-10-CM | POA: Diagnosis not present

## 2021-04-27 DIAGNOSIS — I10 Essential (primary) hypertension: Secondary | ICD-10-CM | POA: Diagnosis not present

## 2021-04-27 DIAGNOSIS — J449 Chronic obstructive pulmonary disease, unspecified: Secondary | ICD-10-CM | POA: Diagnosis not present

## 2021-04-27 DIAGNOSIS — F4323 Adjustment disorder with mixed anxiety and depressed mood: Secondary | ICD-10-CM | POA: Diagnosis not present

## 2021-04-27 DIAGNOSIS — E785 Hyperlipidemia, unspecified: Secondary | ICD-10-CM | POA: Diagnosis not present

## 2021-04-27 DIAGNOSIS — J9610 Chronic respiratory failure, unspecified whether with hypoxia or hypercapnia: Secondary | ICD-10-CM | POA: Diagnosis not present

## 2021-04-28 ENCOUNTER — Ambulatory Visit (INDEPENDENT_AMBULATORY_CARE_PROVIDER_SITE_OTHER): Payer: Medicare Other | Admitting: Emergency Medicine

## 2021-04-28 ENCOUNTER — Encounter: Payer: Self-pay | Admitting: Emergency Medicine

## 2021-04-28 ENCOUNTER — Other Ambulatory Visit: Payer: Self-pay

## 2021-04-28 VITALS — BP 114/72 | HR 72 | Temp 98.1°F | Ht 62.0 in | Wt 130.0 lb

## 2021-04-28 DIAGNOSIS — J9611 Chronic respiratory failure with hypoxia: Secondary | ICD-10-CM | POA: Diagnosis not present

## 2021-04-28 DIAGNOSIS — J441 Chronic obstructive pulmonary disease with (acute) exacerbation: Secondary | ICD-10-CM

## 2021-04-28 MED ORDER — PREDNISONE 10 MG PO TABS: 10.0000 mg | ORAL_TABLET | Freq: Every day | ORAL | 0 refills | Status: DC

## 2021-04-28 NOTE — Addendum Note (Signed)
Addended by: Gavin Potters R on: 04/28/2021 11:12 AM   Modules accepted: Orders

## 2021-04-28 NOTE — Patient Instructions (Addendum)
Please continue Trelegy 1 inhalation once daily.  Rinse and gargle after using. Keep your DuoNeb available to use up to every 6 hours when needed for shortness of breath, chest tightness, wheezing. We will try starting prednisone 10 mg once daily to see if you get benefit. Continue your oxygen at 6-10 L/min depending on your level of activity. Continue to use your trilogy ventilator at night We discussed your advance directives and goals for care today in the office. Agree with continued support from hospice care Follow-up with Dr. Lamonte Sakai or T Parrett with a phone/video visit in about 1 month so that we can assess your progress on the prednisone.  Depending on your improvement we may decide to continue it or increase it. Follow Dr. Lamonte Sakai in 4 months.

## 2021-04-28 NOTE — Assessment & Plan Note (Signed)
Continue your oxygen at 6-10 L/min depending on your level of activity. Continue to use your trilogy ventilator at night

## 2021-04-28 NOTE — Progress Notes (Signed)
Subjective:    Patient ID: Kayla Mack, female    DOB: 01/27/1946, 75 y.o.   MRN: [id number]  HPI  ROV 09/10/20 --this is a follow-up visit for 75 year old woman with very severe COPD, secondary pulmonary hypertension and combined respiratory failure.  She uses a trilogy ventilator at night.  Reports that her compliance is intermittent, uses 3-4x a week. She is not on daily prednisone. She has Zoster that has developed on her R forehead - on valaciclovir.   She is on Trelegy, DuoNeb  Oxygen at 5 to 10 L/min depending on her exertion We did paperwork and orders for her to get a motorized wheelchair.  It sounds like the orders have gone through.  This is on back order and should come in March 2022  ROV 04/28/21 --Kayla Mack is 75 and has hypercapnic/hypoxic respiratory failure and secondary pulmonary hypertension in the setting of very severe COPD.  She has been managed on a trilogy ventilator at night, oxygen at 6-10 L/min depending on her level of exertion. She is very limited with any exertion. Just walking through the house makes her dyspneic. Occasional daily wheeze.  Managed on Trelegy, DuoNeb. Uses duoneb about 1x a day She has been referred to Hospice, has home visits about once a week. She is considering a transition to a senior care facility. She is dealing with loneliness, likes to be around people but unable. She has an Clinical research associate. She is not on prednisone  She wishes to be DNR/I. I confirmed this with her today. Extended discussion with her today regarding her prognosis, life expectancy, goals for care.  Total time spent 40 minutes   Review of Systems As per HPI     Objective:   Physical Exam Vitals:   04/28/21 1027  BP: 114/72  Pulse: 72  Temp: 98.1 F (36.7 C)  TempSrc: Oral  SpO2: 91%  Weight: 130 lb (59 kg)  Height: _0  (1.575 m)   Gen: Pleasant, elderly woman, in no distress,  normal affect, O2 in place  ENT: mouth clear,  oropharynx  clear, no postnasal drip, strong voice  Neck: No JVD, no stridor  Lungs: No use of accessory muscles, no crackles or wheezing on normal respiration, she does wheeze on a forced expiration.   Cardiovascular: RRR, heart sounds normal, no murmur or gallops, no peripheral edema  Musculoskeletal: No deformities, no cyanosis or clubbing  Neuro: alert, awake, non focal  Skin: Warm, no lesions or rash      Assessment & Plan:  COPD (chronic obstructive pulmonary disease) (HCC) Please continue Trelegy 1 inhalation once daily.  Rinse and gargle after using. Keep your DuoNeb available to use up to every 6 hours when needed for shortness of breath, chest tightness, wheezing. We will try starting prednisone 10 mg once daily to see if you get benefit. We discussed your advance directives and goals for care today in the office. Agree with continued support from hospice care Follow-up with Dr. Lamonte Sakai or T Parrett with a phone/video visit in about 1 month so that we can assess your progress on the prednisone.  Depending on your improvement we may decide to continue it or increase it. Follow Dr. Lamonte Sakai in 4 months.  Chronic respiratory failure with hypoxia (HCC) Continue your oxygen at 6-10 L/min depending on your level of activity. Continue to use your trilogy ventilator at night  Baltazar Apo, MD, PhD 04/28/2021, 11:02 AM [facility] [phone] or if no answer [phone]

## 2021-04-28 NOTE — Assessment & Plan Note (Signed)
Please continue Trelegy 1 inhalation once daily.  Rinse and gargle after using. Keep your DuoNeb available to use up to every 6 hours when needed for shortness of breath, chest tightness, wheezing. We will try starting prednisone 10 mg once daily to see if you get benefit. We discussed your advance directives and goals for care today in the office. Agree with continued support from hospice care Follow-up with Dr. Lamonte Sakai or T Parrett with a phone/video visit in about 1 month so that we can assess your progress on the prednisone.  Depending on your improvement we may decide to continue it or increase it. Follow Dr. Lamonte Sakai in 4 months.

## 2021-05-01 DIAGNOSIS — I270 Primary pulmonary hypertension: Secondary | ICD-10-CM | POA: Diagnosis not present

## 2021-05-01 DIAGNOSIS — J9610 Chronic respiratory failure, unspecified whether with hypoxia or hypercapnia: Secondary | ICD-10-CM | POA: Diagnosis not present

## 2021-05-01 DIAGNOSIS — J449 Chronic obstructive pulmonary disease, unspecified: Secondary | ICD-10-CM | POA: Diagnosis not present

## 2021-05-01 DIAGNOSIS — I10 Essential (primary) hypertension: Secondary | ICD-10-CM | POA: Diagnosis not present

## 2021-05-01 DIAGNOSIS — E785 Hyperlipidemia, unspecified: Secondary | ICD-10-CM | POA: Diagnosis not present

## 2021-05-01 DIAGNOSIS — F329 Major depressive disorder, single episode, unspecified: Secondary | ICD-10-CM | POA: Diagnosis not present

## 2021-05-05 DIAGNOSIS — F4323 Adjustment disorder with mixed anxiety and depressed mood: Secondary | ICD-10-CM | POA: Diagnosis not present

## 2021-05-14 DIAGNOSIS — I10 Essential (primary) hypertension: Secondary | ICD-10-CM | POA: Diagnosis not present

## 2021-05-14 DIAGNOSIS — J9610 Chronic respiratory failure, unspecified whether with hypoxia or hypercapnia: Secondary | ICD-10-CM | POA: Diagnosis not present

## 2021-05-14 DIAGNOSIS — E785 Hyperlipidemia, unspecified: Secondary | ICD-10-CM | POA: Diagnosis not present

## 2021-05-14 DIAGNOSIS — I270 Primary pulmonary hypertension: Secondary | ICD-10-CM | POA: Diagnosis not present

## 2021-05-14 DIAGNOSIS — J449 Chronic obstructive pulmonary disease, unspecified: Secondary | ICD-10-CM | POA: Diagnosis not present

## 2021-05-14 DIAGNOSIS — F329 Major depressive disorder, single episode, unspecified: Secondary | ICD-10-CM | POA: Diagnosis not present

## 2021-05-15 DIAGNOSIS — J449 Chronic obstructive pulmonary disease, unspecified: Secondary | ICD-10-CM | POA: Diagnosis not present

## 2021-05-15 DIAGNOSIS — E785 Hyperlipidemia, unspecified: Secondary | ICD-10-CM | POA: Diagnosis not present

## 2021-05-15 DIAGNOSIS — I270 Primary pulmonary hypertension: Secondary | ICD-10-CM | POA: Diagnosis not present

## 2021-05-15 DIAGNOSIS — I10 Essential (primary) hypertension: Secondary | ICD-10-CM | POA: Diagnosis not present

## 2021-05-15 DIAGNOSIS — F329 Major depressive disorder, single episode, unspecified: Secondary | ICD-10-CM | POA: Diagnosis not present

## 2021-05-15 DIAGNOSIS — J9610 Chronic respiratory failure, unspecified whether with hypoxia or hypercapnia: Secondary | ICD-10-CM | POA: Diagnosis not present

## 2021-05-15 DIAGNOSIS — F4323 Adjustment disorder with mixed anxiety and depressed mood: Secondary | ICD-10-CM | POA: Diagnosis not present

## 2021-05-18 DIAGNOSIS — F329 Major depressive disorder, single episode, unspecified: Secondary | ICD-10-CM | POA: Diagnosis not present

## 2021-05-18 DIAGNOSIS — E785 Hyperlipidemia, unspecified: Secondary | ICD-10-CM | POA: Diagnosis not present

## 2021-05-18 DIAGNOSIS — I270 Primary pulmonary hypertension: Secondary | ICD-10-CM | POA: Diagnosis not present

## 2021-05-18 DIAGNOSIS — J302 Other seasonal allergic rhinitis: Secondary | ICD-10-CM | POA: Diagnosis not present

## 2021-05-18 DIAGNOSIS — J449 Chronic obstructive pulmonary disease, unspecified: Secondary | ICD-10-CM | POA: Diagnosis not present

## 2021-05-18 DIAGNOSIS — K219 Gastro-esophageal reflux disease without esophagitis: Secondary | ICD-10-CM | POA: Diagnosis not present

## 2021-05-18 DIAGNOSIS — J9610 Chronic respiratory failure, unspecified whether with hypoxia or hypercapnia: Secondary | ICD-10-CM | POA: Diagnosis not present

## 2021-05-18 DIAGNOSIS — Z9981 Dependence on supplemental oxygen: Secondary | ICD-10-CM | POA: Diagnosis not present

## 2021-05-18 DIAGNOSIS — I10 Essential (primary) hypertension: Secondary | ICD-10-CM | POA: Diagnosis not present

## 2021-05-21 DIAGNOSIS — I270 Primary pulmonary hypertension: Secondary | ICD-10-CM | POA: Diagnosis not present

## 2021-05-21 DIAGNOSIS — J449 Chronic obstructive pulmonary disease, unspecified: Secondary | ICD-10-CM | POA: Diagnosis not present

## 2021-05-21 DIAGNOSIS — J9610 Chronic respiratory failure, unspecified whether with hypoxia or hypercapnia: Secondary | ICD-10-CM | POA: Diagnosis not present

## 2021-05-21 DIAGNOSIS — I10 Essential (primary) hypertension: Secondary | ICD-10-CM | POA: Diagnosis not present

## 2021-05-21 DIAGNOSIS — E785 Hyperlipidemia, unspecified: Secondary | ICD-10-CM | POA: Diagnosis not present

## 2021-05-21 DIAGNOSIS — F329 Major depressive disorder, single episode, unspecified: Secondary | ICD-10-CM | POA: Diagnosis not present

## 2021-05-26 DIAGNOSIS — F4323 Adjustment disorder with mixed anxiety and depressed mood: Secondary | ICD-10-CM | POA: Diagnosis not present

## 2021-05-28 DIAGNOSIS — I10 Essential (primary) hypertension: Secondary | ICD-10-CM | POA: Diagnosis not present

## 2021-05-28 DIAGNOSIS — J449 Chronic obstructive pulmonary disease, unspecified: Secondary | ICD-10-CM | POA: Diagnosis not present

## 2021-05-28 DIAGNOSIS — F329 Major depressive disorder, single episode, unspecified: Secondary | ICD-10-CM | POA: Diagnosis not present

## 2021-05-28 DIAGNOSIS — J9610 Chronic respiratory failure, unspecified whether with hypoxia or hypercapnia: Secondary | ICD-10-CM | POA: Diagnosis not present

## 2021-05-28 DIAGNOSIS — E785 Hyperlipidemia, unspecified: Secondary | ICD-10-CM | POA: Diagnosis not present

## 2021-05-28 DIAGNOSIS — I270 Primary pulmonary hypertension: Secondary | ICD-10-CM | POA: Diagnosis not present

## 2021-05-29 ENCOUNTER — Other Ambulatory Visit: Payer: Self-pay

## 2021-05-29 ENCOUNTER — Encounter: Payer: Self-pay | Admitting: Adult Health

## 2021-05-29 ENCOUNTER — Ambulatory Visit (INDEPENDENT_AMBULATORY_CARE_PROVIDER_SITE_OTHER): Payer: Medicare Other | Admitting: Adult Health

## 2021-05-29 DIAGNOSIS — J441 Chronic obstructive pulmonary disease with (acute) exacerbation: Secondary | ICD-10-CM

## 2021-05-29 DIAGNOSIS — J449 Chronic obstructive pulmonary disease, unspecified: Secondary | ICD-10-CM | POA: Diagnosis not present

## 2021-05-29 DIAGNOSIS — I10 Essential (primary) hypertension: Secondary | ICD-10-CM | POA: Diagnosis not present

## 2021-05-29 DIAGNOSIS — J9611 Chronic respiratory failure with hypoxia: Secondary | ICD-10-CM | POA: Diagnosis not present

## 2021-05-29 DIAGNOSIS — F329 Major depressive disorder, single episode, unspecified: Secondary | ICD-10-CM | POA: Diagnosis not present

## 2021-05-29 DIAGNOSIS — J9610 Chronic respiratory failure, unspecified whether with hypoxia or hypercapnia: Secondary | ICD-10-CM | POA: Diagnosis not present

## 2021-05-29 DIAGNOSIS — E785 Hyperlipidemia, unspecified: Secondary | ICD-10-CM | POA: Diagnosis not present

## 2021-05-29 DIAGNOSIS — I270 Primary pulmonary hypertension: Secondary | ICD-10-CM | POA: Diagnosis not present

## 2021-05-29 MED ORDER — PREDNISONE 10 MG PO TABS: 10.0000 mg | ORAL_TABLET | Freq: Every day | ORAL | 3 refills | Status: DC

## 2021-05-29 MED ORDER — IPRATROPIUM-ALBUTEROL 0.5-2.5 (3) MG/3ML IN SOLN: 3.0000 mL | Freq: Two times a day (BID) | RESPIRATORY_TRACT | 6 refills | Status: DC

## 2021-05-29 MED ORDER — TRELEGY ELLIPTA 100-62.5-25 MCG/INH IN AEPB: 1.0000 | INHALATION_SPRAY | Freq: Every day | RESPIRATORY_TRACT | 5 refills | Status: AC

## 2021-05-29 NOTE — Progress Notes (Signed)
Virtual Visit via Telephone Note  I connected with Kayla Mack on 05/29/21 at 10:00 AM EDT by telephone and verified that I am speaking with the correct person using two identifiers.  Location: Patient: Home  Provider: Office    I discussed the limitations, risks, security and privacy concerns of performing an evaluation and management service by telephone and the availability of in person appointments. I also discussed with the patient that there may be a patient responsible charge related to this service. The patient expressed understanding and agreed to proceed.   History of Present Illness: 75 year old female followed for very severe COPD, chronic respiratory failure on oxygen 6 to 10 L, pulmonary hypertension.  Patient is on nocturnal trilogy vent.  Today's televisit is a 1 month follow-up  Patient has underlying very severe COPD that is oxygen dependent.  She is maintained on aggressive maintenance regimen with Trelegy inhaler daily, DuoNeb nebulizer twice daily, oxygen at 6 L at rest and 8 to 10 L with activity.  She was recently started last month on prednisone 10 mg daily for increased symptom burden.  She is now being followed by hospice care at home.  Patient says she really feels that the prednisone has helped.  She does feel slightly better.  Has more energy and decreased breathlessness.  Also feels that hospice is helping because she has some support at home which gives her great comfort.  She denies any increased cough or wheezing.   We discussed the potential complications of chronic steroid use. Patient says appetite is good with no nausea vomiting or diarrhea.  She denies any hemoptysis, chest pain, orthopnea or increased leg swelling  She does live at home alone.  She is thinking about moving into an independent living at [facility].  But wants to hold off as long as she can.  Past Medical History:  Diagnosis Date   Anxiety    COPD (chronic obstructive pulmonary  disease) ([place])    Depression    Hyperlipidemia    Hypertension    Current Outpatient Medications on File Prior to Visit  Medication Sig Dispense Refill   acetaminophen (TYLENOL) 500 MG tablet Take 1,000 mg by mouth every 6 (six) hours as needed for moderate pain.      albuterol (PROVENTIL) (2.5 MG/3ML) 0.083% nebulizer solution Take 3 mLs (2.5 mg total) by nebulization every 6 (six) hours as needed for wheezing or shortness of breath. 90 mL 5   albuterol (VENTOLIN HFA) 108 (90 Base) MCG/ACT inhaler Inhale 2 puffs into the lungs every 6 (six) hours as needed for wheezing or shortness of breath. 18 g 5   clonazePAM (KLONOPIN) 0.5 MG tablet Take 1 tablet (0.5 mg total) by mouth 2 (two) times daily as needed for anxiety. 10 tablet 0   clopidogrel (PLAVIX) 75 MG tablet Take 1 tablet (75 mg total) by mouth daily. 30 tablet 0   diphenhydrAMINE (BENADRYL) 25 mg capsule Take 25 mg by mouth every 6 (six) hours as needed for allergies.      donepezil (ARICEPT) 5 MG tablet Take 5 mg by mouth at bedtime.      fluticasone (FLONASE) 50 MCG/ACT nasal spray Place 2 sprays into both nostrils daily. 48 g 1   guaiFENesin (MUCINEX) 600 MG 12 hr tablet Take 1 tablet (600 mg total) by mouth 2 (two) times daily. 30 tablet 0   loratadine (CLARITIN) 10 MG tablet Take 1 tablet (10 mg total) by mouth daily. (Patient taking differently: Take 10 mg  by mouth daily as needed for allergies.) 30 tablet 0   lovastatin (MEVACOR) 40 MG tablet Take 40 mg by mouth every morning.     PARoxetine (PAXIL) 40 MG tablet Take 40 mg by mouth daily.      prednisoLONE acetate (PRED FORTE) 1 % ophthalmic suspension      sodium chloride (OCEAN) 0.65 % SOLN nasal spray Place 1 spray into both nostrils as needed for congestion.     valACYclovir (VALTREX) 1000 MG tablet      No current facility-administered medications on file prior to visit.    Observations/Objective: PFTs October 2018, FEV1 46%, ratio 42, FVC 83%, no significant  bronchodilator response, DLCO 24%.  CT chest December 2019 - for PE, severe emphysema  Assessment and Plan: Very severe COPD-patient's had decreased symptom burden since starting on prednisone low-dose at 10 mg daily.  Continue on maintenance regimen with Trelegy inhaler and DuoNeb.  Chronic oxygen dependent respiratory failure-continue on oxygen to maintain O2 saturations greater than 88 to 90%.  Continue on 6 L at rest and 8 to 10 L with activity.  Continue on nocturnal trilogy vent.  Plan  Patient Instructions  Continue on TRELEGY 1 puff daily , rinse after use.  Continue on Trilogy Vent At bedtime   Continue on Duoneb  Continue on Prednisone 10m daily -take with food  Continue on Oxygen 6l/m rest and 10 l/m with activity  Activity as tolerated.  Follow up with Dr. BLamonte Sakaiin 3 months and As needed   Please contact office for sooner follow up if symptoms do not improve or worsen or seek emergency care       Follow Up Instructions:    I discussed the assessment and treatment plan with the patient. The patient was provided an opportunity to ask questions and all were answered. The patient agreed with the plan and demonstrated an understanding of the instructions.   The patient was advised to call back or seek an in-person evaluation if the symptoms worsen or if the condition fails to improve as anticipated.  I provided 22  minutes of non-face-to-face time during this encounter.   [name] NP

## 2021-05-29 NOTE — Patient Instructions (Addendum)
Continue on TRELEGY 1 puff daily , rinse after use.  Continue on Trilogy Vent At bedtime   Continue on Duoneb  Continue on Prednisone 81m daily -take with food  Continue on Oxygen 6l/m rest and 10 l/m with activity  Activity as tolerated.  Follow up with Dr. BLamonte Sakaiin 3 months and As needed   Please contact office for sooner follow up if symptoms do not improve or worsen or seek emergency care

## 2021-06-04 DIAGNOSIS — F4323 Adjustment disorder with mixed anxiety and depressed mood: Secondary | ICD-10-CM | POA: Diagnosis not present

## 2021-06-05 DIAGNOSIS — J9610 Chronic respiratory failure, unspecified whether with hypoxia or hypercapnia: Secondary | ICD-10-CM | POA: Diagnosis not present

## 2021-06-05 DIAGNOSIS — I270 Primary pulmonary hypertension: Secondary | ICD-10-CM | POA: Diagnosis not present

## 2021-06-05 DIAGNOSIS — F329 Major depressive disorder, single episode, unspecified: Secondary | ICD-10-CM | POA: Diagnosis not present

## 2021-06-05 DIAGNOSIS — J449 Chronic obstructive pulmonary disease, unspecified: Secondary | ICD-10-CM | POA: Diagnosis not present

## 2021-06-05 DIAGNOSIS — E785 Hyperlipidemia, unspecified: Secondary | ICD-10-CM | POA: Diagnosis not present

## 2021-06-05 DIAGNOSIS — I10 Essential (primary) hypertension: Secondary | ICD-10-CM | POA: Diagnosis not present

## 2021-06-10 DIAGNOSIS — I270 Primary pulmonary hypertension: Secondary | ICD-10-CM | POA: Diagnosis not present

## 2021-06-10 DIAGNOSIS — J449 Chronic obstructive pulmonary disease, unspecified: Secondary | ICD-10-CM | POA: Diagnosis not present

## 2021-06-10 DIAGNOSIS — J9610 Chronic respiratory failure, unspecified whether with hypoxia or hypercapnia: Secondary | ICD-10-CM | POA: Diagnosis not present

## 2021-06-10 DIAGNOSIS — E785 Hyperlipidemia, unspecified: Secondary | ICD-10-CM | POA: Diagnosis not present

## 2021-06-10 DIAGNOSIS — F329 Major depressive disorder, single episode, unspecified: Secondary | ICD-10-CM | POA: Diagnosis not present

## 2021-06-10 DIAGNOSIS — I10 Essential (primary) hypertension: Secondary | ICD-10-CM | POA: Diagnosis not present

## 2021-06-15 DIAGNOSIS — Z961 Presence of intraocular lens: Secondary | ICD-10-CM | POA: Diagnosis not present

## 2021-06-16 DIAGNOSIS — J449 Chronic obstructive pulmonary disease, unspecified: Secondary | ICD-10-CM | POA: Diagnosis not present

## 2021-06-16 DIAGNOSIS — I10 Essential (primary) hypertension: Secondary | ICD-10-CM | POA: Diagnosis not present

## 2021-06-16 DIAGNOSIS — J9610 Chronic respiratory failure, unspecified whether with hypoxia or hypercapnia: Secondary | ICD-10-CM | POA: Diagnosis not present

## 2021-06-16 DIAGNOSIS — F329 Major depressive disorder, single episode, unspecified: Secondary | ICD-10-CM | POA: Diagnosis not present

## 2021-06-16 DIAGNOSIS — E785 Hyperlipidemia, unspecified: Secondary | ICD-10-CM | POA: Diagnosis not present

## 2021-06-16 DIAGNOSIS — I270 Primary pulmonary hypertension: Secondary | ICD-10-CM | POA: Diagnosis not present

## 2021-06-16 DIAGNOSIS — F4323 Adjustment disorder with mixed anxiety and depressed mood: Secondary | ICD-10-CM | POA: Diagnosis not present

## 2021-06-18 ENCOUNTER — Other Ambulatory Visit: Payer: Self-pay | Admitting: *Deleted

## 2021-06-18 DIAGNOSIS — F329 Major depressive disorder, single episode, unspecified: Secondary | ICD-10-CM | POA: Diagnosis not present

## 2021-06-18 DIAGNOSIS — J449 Chronic obstructive pulmonary disease, unspecified: Secondary | ICD-10-CM | POA: Diagnosis not present

## 2021-06-18 DIAGNOSIS — K219 Gastro-esophageal reflux disease without esophagitis: Secondary | ICD-10-CM | POA: Diagnosis not present

## 2021-06-18 DIAGNOSIS — Z9981 Dependence on supplemental oxygen: Secondary | ICD-10-CM | POA: Diagnosis not present

## 2021-06-18 DIAGNOSIS — I270 Primary pulmonary hypertension: Secondary | ICD-10-CM | POA: Diagnosis not present

## 2021-06-18 DIAGNOSIS — J302 Other seasonal allergic rhinitis: Secondary | ICD-10-CM | POA: Diagnosis not present

## 2021-06-18 DIAGNOSIS — I10 Essential (primary) hypertension: Secondary | ICD-10-CM | POA: Diagnosis not present

## 2021-06-18 DIAGNOSIS — J9610 Chronic respiratory failure, unspecified whether with hypoxia or hypercapnia: Secondary | ICD-10-CM | POA: Diagnosis not present

## 2021-06-18 DIAGNOSIS — E785 Hyperlipidemia, unspecified: Secondary | ICD-10-CM | POA: Diagnosis not present

## 2021-06-18 MED ORDER — IPRATROPIUM-ALBUTEROL 0.5-2.5 (3) MG/3ML IN SOLN: 3.0000 mL | Freq: Two times a day (BID) | RESPIRATORY_TRACT | 6 refills | Status: AC

## 2021-06-19 DIAGNOSIS — I10 Essential (primary) hypertension: Secondary | ICD-10-CM | POA: Diagnosis not present

## 2021-06-19 DIAGNOSIS — J449 Chronic obstructive pulmonary disease, unspecified: Secondary | ICD-10-CM | POA: Diagnosis not present

## 2021-06-19 DIAGNOSIS — J9610 Chronic respiratory failure, unspecified whether with hypoxia or hypercapnia: Secondary | ICD-10-CM | POA: Diagnosis not present

## 2021-06-19 DIAGNOSIS — E785 Hyperlipidemia, unspecified: Secondary | ICD-10-CM | POA: Diagnosis not present

## 2021-06-19 DIAGNOSIS — I270 Primary pulmonary hypertension: Secondary | ICD-10-CM | POA: Diagnosis not present

## 2021-06-19 DIAGNOSIS — F329 Major depressive disorder, single episode, unspecified: Secondary | ICD-10-CM | POA: Diagnosis not present

## 2021-06-25 DIAGNOSIS — F4323 Adjustment disorder with mixed anxiety and depressed mood: Secondary | ICD-10-CM | POA: Diagnosis not present

## 2021-06-26 DIAGNOSIS — F329 Major depressive disorder, single episode, unspecified: Secondary | ICD-10-CM | POA: Diagnosis not present

## 2021-06-26 DIAGNOSIS — I270 Primary pulmonary hypertension: Secondary | ICD-10-CM | POA: Diagnosis not present

## 2021-06-26 DIAGNOSIS — J9610 Chronic respiratory failure, unspecified whether with hypoxia or hypercapnia: Secondary | ICD-10-CM | POA: Diagnosis not present

## 2021-06-26 DIAGNOSIS — I10 Essential (primary) hypertension: Secondary | ICD-10-CM | POA: Diagnosis not present

## 2021-06-26 DIAGNOSIS — E785 Hyperlipidemia, unspecified: Secondary | ICD-10-CM | POA: Diagnosis not present

## 2021-06-26 DIAGNOSIS — J449 Chronic obstructive pulmonary disease, unspecified: Secondary | ICD-10-CM | POA: Diagnosis not present

## 2021-06-30 DIAGNOSIS — I270 Primary pulmonary hypertension: Secondary | ICD-10-CM | POA: Diagnosis not present

## 2021-06-30 DIAGNOSIS — I10 Essential (primary) hypertension: Secondary | ICD-10-CM | POA: Diagnosis not present

## 2021-06-30 DIAGNOSIS — F329 Major depressive disorder, single episode, unspecified: Secondary | ICD-10-CM | POA: Diagnosis not present

## 2021-06-30 DIAGNOSIS — J9610 Chronic respiratory failure, unspecified whether with hypoxia or hypercapnia: Secondary | ICD-10-CM | POA: Diagnosis not present

## 2021-06-30 DIAGNOSIS — J449 Chronic obstructive pulmonary disease, unspecified: Secondary | ICD-10-CM | POA: Diagnosis not present

## 2021-06-30 DIAGNOSIS — E785 Hyperlipidemia, unspecified: Secondary | ICD-10-CM | POA: Diagnosis not present

## 2021-07-03 DIAGNOSIS — E785 Hyperlipidemia, unspecified: Secondary | ICD-10-CM | POA: Diagnosis not present

## 2021-07-03 DIAGNOSIS — I270 Primary pulmonary hypertension: Secondary | ICD-10-CM | POA: Diagnosis not present

## 2021-07-03 DIAGNOSIS — I10 Essential (primary) hypertension: Secondary | ICD-10-CM | POA: Diagnosis not present

## 2021-07-03 DIAGNOSIS — J9610 Chronic respiratory failure, unspecified whether with hypoxia or hypercapnia: Secondary | ICD-10-CM | POA: Diagnosis not present

## 2021-07-03 DIAGNOSIS — J449 Chronic obstructive pulmonary disease, unspecified: Secondary | ICD-10-CM | POA: Diagnosis not present

## 2021-07-03 DIAGNOSIS — F329 Major depressive disorder, single episode, unspecified: Secondary | ICD-10-CM | POA: Diagnosis not present

## 2021-07-09 DIAGNOSIS — F4323 Adjustment disorder with mixed anxiety and depressed mood: Secondary | ICD-10-CM | POA: Diagnosis not present

## 2021-07-10 DIAGNOSIS — E785 Hyperlipidemia, unspecified: Secondary | ICD-10-CM | POA: Diagnosis not present

## 2021-07-10 DIAGNOSIS — J9610 Chronic respiratory failure, unspecified whether with hypoxia or hypercapnia: Secondary | ICD-10-CM | POA: Diagnosis not present

## 2021-07-10 DIAGNOSIS — J449 Chronic obstructive pulmonary disease, unspecified: Secondary | ICD-10-CM | POA: Diagnosis not present

## 2021-07-10 DIAGNOSIS — I270 Primary pulmonary hypertension: Secondary | ICD-10-CM | POA: Diagnosis not present

## 2021-07-10 DIAGNOSIS — F329 Major depressive disorder, single episode, unspecified: Secondary | ICD-10-CM | POA: Diagnosis not present

## 2021-07-10 DIAGNOSIS — I10 Essential (primary) hypertension: Secondary | ICD-10-CM | POA: Diagnosis not present

## 2021-07-11 DIAGNOSIS — F329 Major depressive disorder, single episode, unspecified: Secondary | ICD-10-CM | POA: Diagnosis not present

## 2021-07-11 DIAGNOSIS — J9610 Chronic respiratory failure, unspecified whether with hypoxia or hypercapnia: Secondary | ICD-10-CM | POA: Diagnosis not present

## 2021-07-11 DIAGNOSIS — I270 Primary pulmonary hypertension: Secondary | ICD-10-CM | POA: Diagnosis not present

## 2021-07-11 DIAGNOSIS — I10 Essential (primary) hypertension: Secondary | ICD-10-CM | POA: Diagnosis not present

## 2021-07-11 DIAGNOSIS — E785 Hyperlipidemia, unspecified: Secondary | ICD-10-CM | POA: Diagnosis not present

## 2021-07-11 DIAGNOSIS — J449 Chronic obstructive pulmonary disease, unspecified: Secondary | ICD-10-CM | POA: Diagnosis not present

## 2021-07-13 DIAGNOSIS — Z23 Encounter for immunization: Secondary | ICD-10-CM | POA: Diagnosis not present

## 2021-07-14 DIAGNOSIS — I270 Primary pulmonary hypertension: Secondary | ICD-10-CM | POA: Diagnosis not present

## 2021-07-14 DIAGNOSIS — E785 Hyperlipidemia, unspecified: Secondary | ICD-10-CM | POA: Diagnosis not present

## 2021-07-14 DIAGNOSIS — I10 Essential (primary) hypertension: Secondary | ICD-10-CM | POA: Diagnosis not present

## 2021-07-14 DIAGNOSIS — F329 Major depressive disorder, single episode, unspecified: Secondary | ICD-10-CM | POA: Diagnosis not present

## 2021-07-14 DIAGNOSIS — J449 Chronic obstructive pulmonary disease, unspecified: Secondary | ICD-10-CM | POA: Diagnosis not present

## 2021-07-14 DIAGNOSIS — J9610 Chronic respiratory failure, unspecified whether with hypoxia or hypercapnia: Secondary | ICD-10-CM | POA: Diagnosis not present

## 2021-07-17 DIAGNOSIS — E785 Hyperlipidemia, unspecified: Secondary | ICD-10-CM | POA: Diagnosis not present

## 2021-07-17 DIAGNOSIS — I10 Essential (primary) hypertension: Secondary | ICD-10-CM | POA: Diagnosis not present

## 2021-07-17 DIAGNOSIS — J9610 Chronic respiratory failure, unspecified whether with hypoxia or hypercapnia: Secondary | ICD-10-CM | POA: Diagnosis not present

## 2021-07-17 DIAGNOSIS — J449 Chronic obstructive pulmonary disease, unspecified: Secondary | ICD-10-CM | POA: Diagnosis not present

## 2021-07-17 DIAGNOSIS — F329 Major depressive disorder, single episode, unspecified: Secondary | ICD-10-CM | POA: Diagnosis not present

## 2021-07-17 DIAGNOSIS — I270 Primary pulmonary hypertension: Secondary | ICD-10-CM | POA: Diagnosis not present

## 2021-07-18 DIAGNOSIS — J449 Chronic obstructive pulmonary disease, unspecified: Secondary | ICD-10-CM | POA: Diagnosis not present

## 2021-07-18 DIAGNOSIS — F329 Major depressive disorder, single episode, unspecified: Secondary | ICD-10-CM | POA: Diagnosis not present

## 2021-07-18 DIAGNOSIS — J9610 Chronic respiratory failure, unspecified whether with hypoxia or hypercapnia: Secondary | ICD-10-CM | POA: Diagnosis not present

## 2021-07-18 DIAGNOSIS — K219 Gastro-esophageal reflux disease without esophagitis: Secondary | ICD-10-CM | POA: Diagnosis not present

## 2021-07-18 DIAGNOSIS — I270 Primary pulmonary hypertension: Secondary | ICD-10-CM | POA: Diagnosis not present

## 2021-07-18 DIAGNOSIS — E785 Hyperlipidemia, unspecified: Secondary | ICD-10-CM | POA: Diagnosis not present

## 2021-07-18 DIAGNOSIS — J302 Other seasonal allergic rhinitis: Secondary | ICD-10-CM | POA: Diagnosis not present

## 2021-07-18 DIAGNOSIS — I10 Essential (primary) hypertension: Secondary | ICD-10-CM | POA: Diagnosis not present

## 2021-07-18 DIAGNOSIS — Z9981 Dependence on supplemental oxygen: Secondary | ICD-10-CM | POA: Diagnosis not present

## 2021-07-21 DIAGNOSIS — F4323 Adjustment disorder with mixed anxiety and depressed mood: Secondary | ICD-10-CM | POA: Diagnosis not present

## 2021-07-31 DIAGNOSIS — J449 Chronic obstructive pulmonary disease, unspecified: Secondary | ICD-10-CM | POA: Diagnosis not present

## 2021-07-31 DIAGNOSIS — I270 Primary pulmonary hypertension: Secondary | ICD-10-CM | POA: Diagnosis not present

## 2021-07-31 DIAGNOSIS — J9610 Chronic respiratory failure, unspecified whether with hypoxia or hypercapnia: Secondary | ICD-10-CM | POA: Diagnosis not present

## 2021-07-31 DIAGNOSIS — I10 Essential (primary) hypertension: Secondary | ICD-10-CM | POA: Diagnosis not present

## 2021-07-31 DIAGNOSIS — F329 Major depressive disorder, single episode, unspecified: Secondary | ICD-10-CM | POA: Diagnosis not present

## 2021-07-31 DIAGNOSIS — E785 Hyperlipidemia, unspecified: Secondary | ICD-10-CM | POA: Diagnosis not present

## 2021-08-04 DIAGNOSIS — I270 Primary pulmonary hypertension: Secondary | ICD-10-CM | POA: Diagnosis not present

## 2021-08-04 DIAGNOSIS — I10 Essential (primary) hypertension: Secondary | ICD-10-CM | POA: Diagnosis not present

## 2021-08-04 DIAGNOSIS — E785 Hyperlipidemia, unspecified: Secondary | ICD-10-CM | POA: Diagnosis not present

## 2021-08-04 DIAGNOSIS — J449 Chronic obstructive pulmonary disease, unspecified: Secondary | ICD-10-CM | POA: Diagnosis not present

## 2021-08-04 DIAGNOSIS — F329 Major depressive disorder, single episode, unspecified: Secondary | ICD-10-CM | POA: Diagnosis not present

## 2021-08-04 DIAGNOSIS — J9610 Chronic respiratory failure, unspecified whether with hypoxia or hypercapnia: Secondary | ICD-10-CM | POA: Diagnosis not present

## 2021-08-12 DIAGNOSIS — E785 Hyperlipidemia, unspecified: Secondary | ICD-10-CM | POA: Diagnosis not present

## 2021-08-12 DIAGNOSIS — I270 Primary pulmonary hypertension: Secondary | ICD-10-CM | POA: Diagnosis not present

## 2021-08-12 DIAGNOSIS — J449 Chronic obstructive pulmonary disease, unspecified: Secondary | ICD-10-CM | POA: Diagnosis not present

## 2021-08-12 DIAGNOSIS — I10 Essential (primary) hypertension: Secondary | ICD-10-CM | POA: Diagnosis not present

## 2021-08-12 DIAGNOSIS — J9610 Chronic respiratory failure, unspecified whether with hypoxia or hypercapnia: Secondary | ICD-10-CM | POA: Diagnosis not present

## 2021-08-12 DIAGNOSIS — F329 Major depressive disorder, single episode, unspecified: Secondary | ICD-10-CM | POA: Diagnosis not present

## 2021-08-13 DIAGNOSIS — F4323 Adjustment disorder with mixed anxiety and depressed mood: Secondary | ICD-10-CM | POA: Diagnosis not present

## 2021-08-14 DIAGNOSIS — J449 Chronic obstructive pulmonary disease, unspecified: Secondary | ICD-10-CM | POA: Diagnosis not present

## 2021-08-14 DIAGNOSIS — I10 Essential (primary) hypertension: Secondary | ICD-10-CM | POA: Diagnosis not present

## 2021-08-14 DIAGNOSIS — E785 Hyperlipidemia, unspecified: Secondary | ICD-10-CM | POA: Diagnosis not present

## 2021-08-14 DIAGNOSIS — F329 Major depressive disorder, single episode, unspecified: Secondary | ICD-10-CM | POA: Diagnosis not present

## 2021-08-14 DIAGNOSIS — J9610 Chronic respiratory failure, unspecified whether with hypoxia or hypercapnia: Secondary | ICD-10-CM | POA: Diagnosis not present

## 2021-08-14 DIAGNOSIS — I270 Primary pulmonary hypertension: Secondary | ICD-10-CM | POA: Diagnosis not present

## 2021-08-17 DIAGNOSIS — J9610 Chronic respiratory failure, unspecified whether with hypoxia or hypercapnia: Secondary | ICD-10-CM | POA: Diagnosis not present

## 2021-08-17 DIAGNOSIS — E785 Hyperlipidemia, unspecified: Secondary | ICD-10-CM | POA: Diagnosis not present

## 2021-08-17 DIAGNOSIS — J449 Chronic obstructive pulmonary disease, unspecified: Secondary | ICD-10-CM | POA: Diagnosis not present

## 2021-08-17 DIAGNOSIS — F329 Major depressive disorder, single episode, unspecified: Secondary | ICD-10-CM | POA: Diagnosis not present

## 2021-08-17 DIAGNOSIS — I270 Primary pulmonary hypertension: Secondary | ICD-10-CM | POA: Diagnosis not present

## 2021-08-17 DIAGNOSIS — I10 Essential (primary) hypertension: Secondary | ICD-10-CM | POA: Diagnosis not present

## 2021-08-18 DIAGNOSIS — J9610 Chronic respiratory failure, unspecified whether with hypoxia or hypercapnia: Secondary | ICD-10-CM | POA: Diagnosis not present

## 2021-08-18 DIAGNOSIS — E785 Hyperlipidemia, unspecified: Secondary | ICD-10-CM | POA: Diagnosis not present

## 2021-08-18 DIAGNOSIS — F329 Major depressive disorder, single episode, unspecified: Secondary | ICD-10-CM | POA: Diagnosis not present

## 2021-08-18 DIAGNOSIS — I10 Essential (primary) hypertension: Secondary | ICD-10-CM | POA: Diagnosis not present

## 2021-08-18 DIAGNOSIS — Z9981 Dependence on supplemental oxygen: Secondary | ICD-10-CM | POA: Diagnosis not present

## 2021-08-18 DIAGNOSIS — K219 Gastro-esophageal reflux disease without esophagitis: Secondary | ICD-10-CM | POA: Diagnosis not present

## 2021-08-18 DIAGNOSIS — J302 Other seasonal allergic rhinitis: Secondary | ICD-10-CM | POA: Diagnosis not present

## 2021-08-18 DIAGNOSIS — I270 Primary pulmonary hypertension: Secondary | ICD-10-CM | POA: Diagnosis not present

## 2021-08-18 DIAGNOSIS — J449 Chronic obstructive pulmonary disease, unspecified: Secondary | ICD-10-CM | POA: Diagnosis not present

## 2021-08-20 DIAGNOSIS — F4323 Adjustment disorder with mixed anxiety and depressed mood: Secondary | ICD-10-CM | POA: Diagnosis not present

## 2021-08-26 ENCOUNTER — Other Ambulatory Visit: Payer: Self-pay

## 2021-08-26 ENCOUNTER — Ambulatory Visit (INDEPENDENT_AMBULATORY_CARE_PROVIDER_SITE_OTHER): Payer: Medicare Other | Admitting: Emergency Medicine

## 2021-08-26 ENCOUNTER — Encounter: Payer: Self-pay | Admitting: Emergency Medicine

## 2021-08-26 DIAGNOSIS — J9611 Chronic respiratory failure with hypoxia: Secondary | ICD-10-CM | POA: Diagnosis not present

## 2021-08-26 DIAGNOSIS — J441 Chronic obstructive pulmonary disease with (acute) exacerbation: Secondary | ICD-10-CM

## 2021-08-26 NOTE — Assessment & Plan Note (Signed)
DNR/DNI.  She tolerates and benefits from a trilogy ventilator.  Plan to continue.  She is on high flow oxygen 6-10 L/min depending on level of exertion with good compliance.

## 2021-08-26 NOTE — Assessment & Plan Note (Signed)
She has severe disease.  Most pressing issue today has been a significant change in her mood.  She has had depression, anger, anxiety.  Certainly she has stressors in her life including her underlying lung disease that would contribute to depression.  This may be multifactorial but I am concerned that the prednisone may have potentiated some of this.  She is not clear how much it has helped her overall breathing.  She is seeing a Social worker.  I think we need to try stopping the prednisone and see how this affects her breathing and her mood.  It may be possible to go back on it depending on how symptoms play out.  Plan to continue her current bronchodilator regimen.

## 2021-08-26 NOTE — Progress Notes (Signed)
Subjective:    Patient ID: Kayla Mack, female    DOB: Apr 11, 1946, 75 y.o.   MRN: [id number]  HPI  ROV 09/10/20 --this is a follow-up visit for 75 year old woman with very severe COPD, secondary pulmonary hypertension and combined respiratory failure.  She uses a trilogy ventilator at night.  Reports that her compliance is intermittent, uses 3-4x a week. She is not on daily prednisone. She has Zoster that has developed on her R forehead - on valaciclovir.   She is on Trelegy, DuoNeb  Oxygen at 5 to 10 L/min depending on her exertion We did paperwork and orders for her to get a motorized wheelchair.  It sounds like the orders have gone through.  This is on back order and should come in March 2022  ROV 04/28/21 --Kayla Mack is 38 and has hypercapnic/hypoxic respiratory failure and secondary pulmonary hypertension in the setting of very severe COPD.  She has been managed on a trilogy ventilator at night, oxygen at 6-10 L/min depending on her level of exertion. She is very limited with any exertion. Just walking through the house makes her dyspneic. Occasional daily wheeze.  Managed on Trelegy, DuoNeb. Uses duoneb about 1x a day She has been referred to Hospice, has home visits about once a week. She is considering a transition to a senior care facility. She is dealing with loneliness, likes to be around people but unable. She has an Clinical research associate. She is not on prednisone  She wishes to be DNR/I. I confirmed this with her today. Extended discussion with her today regarding her prognosis, life expectancy, goals for care.  ROV 08/26/21 --follow-up visit for 75 year old woman with combined respiratory failure in the setting of severe COPD.  She uses a trilogy ventilator at night, high flow oxygen during the day, currently 6 to 10 L/min.  She has been referred to hospice and has home visits.  She has an Clinical research associate.  Currently managed on Trelegy, DuoNeb which she uses .  Started  on prednisone 10 mg daily in July, she reports that it may have helped her breathing and cough some, but she has developed a very labile mood, depression and anxiety. Also some anger.  She has been lonely. She is on paxil, was started on clonazepam recently. Her former husband died recently, they were still good friends. She sees a Social worker.  She is using flonase Her activity level has gone down, has been in bed, ? Due to her motivation and depression.     Review of Systems As per HPI     Objective:   Physical Exam Vitals:   08/26/21 1008  BP: 120/72  Pulse: 72  Temp: (!) 97.5 F (36.4 C)  TempSrc: Oral  SpO2: 93%  Weight: 133 lb 3.2 oz (60.4 kg)  Height: _0  (1.575 m)   Gen: Pleasant, elderly woman, in no distress,  normal affect, O2 in place  ENT: mouth clear,  oropharynx clear, no postnasal drip, strong voice  Neck: No JVD, no stridor  Lungs: No use of accessory muscles, no crackles or wheezing on normal respiration, she does wheeze on a forced expiration.   Cardiovascular: RRR, heart sounds normal, no murmur or gallops, no peripheral edema  Musculoskeletal: No deformities, no cyanosis or clubbing  Neuro: alert, awake, non focal  Skin: Warm, no lesions or rash      Assessment & Plan:  COPD (chronic obstructive pulmonary disease) ([place]) She has severe disease.  Most pressing issue today has been  a significant change in her mood.  She has had depression, anger, anxiety.  Certainly she has stressors in her life including her underlying lung disease that would contribute to depression.  This may be multifactorial but I am concerned that the prednisone may have potentiated some of this.  She is not clear how much it has helped her overall breathing.  She is seeing a Social worker.  I think we need to try stopping the prednisone and see how this affects her breathing and her mood.  It may be possible to go back on it depending on how symptoms play out.  Plan to continue her  current bronchodilator regimen.  Chronic respiratory failure with hypoxia (HCC) DNR/DNI.  She tolerates and benefits from a trilogy ventilator.  Plan to continue.  She is on high flow oxygen 6-10 L/min depending on level of exertion with good compliance.  Baltazar Apo, MD, PhD 08/26/2021, 10:40 AM [facility] [phone] or if no answer [phone]

## 2021-08-26 NOTE — Patient Instructions (Addendum)
We will stop your prednisone because I am worried that it is contributing to labile mood, depression, anxiety.  Please keep track of how this affects your breathing and also your mood so we can discuss next time. Continue your Trelegy 1 inhalation once daily.  Rinse and gargle after using. Continue to use your DuoNeb up to 4 times daily when you need it for shortness of breath, chest tightness, wheezing. Continue your fluticasone nasal spray 2 sprays each nostril once daily. Continue use your trilogy ventilator every night while sleeping. Continue your oxygen at 6-10 L/min depending on your level of exertion. Follow-up with either Dr. Lamonte Sakai or APP in 1 month

## 2021-08-28 DIAGNOSIS — J9610 Chronic respiratory failure, unspecified whether with hypoxia or hypercapnia: Secondary | ICD-10-CM | POA: Diagnosis not present

## 2021-08-28 DIAGNOSIS — E785 Hyperlipidemia, unspecified: Secondary | ICD-10-CM | POA: Diagnosis not present

## 2021-08-28 DIAGNOSIS — F329 Major depressive disorder, single episode, unspecified: Secondary | ICD-10-CM | POA: Diagnosis not present

## 2021-08-28 DIAGNOSIS — J449 Chronic obstructive pulmonary disease, unspecified: Secondary | ICD-10-CM | POA: Diagnosis not present

## 2021-08-28 DIAGNOSIS — I10 Essential (primary) hypertension: Secondary | ICD-10-CM | POA: Diagnosis not present

## 2021-08-28 DIAGNOSIS — I270 Primary pulmonary hypertension: Secondary | ICD-10-CM | POA: Diagnosis not present

## 2021-09-07 ENCOUNTER — Telehealth: Payer: Self-pay | Admitting: Emergency Medicine

## 2021-09-07 DIAGNOSIS — I270 Primary pulmonary hypertension: Secondary | ICD-10-CM | POA: Diagnosis not present

## 2021-09-07 DIAGNOSIS — I10 Essential (primary) hypertension: Secondary | ICD-10-CM | POA: Diagnosis not present

## 2021-09-07 DIAGNOSIS — E785 Hyperlipidemia, unspecified: Secondary | ICD-10-CM | POA: Diagnosis not present

## 2021-09-07 DIAGNOSIS — F329 Major depressive disorder, single episode, unspecified: Secondary | ICD-10-CM | POA: Diagnosis not present

## 2021-09-07 DIAGNOSIS — J449 Chronic obstructive pulmonary disease, unspecified: Secondary | ICD-10-CM | POA: Diagnosis not present

## 2021-09-07 DIAGNOSIS — J9610 Chronic respiratory failure, unspecified whether with hypoxia or hypercapnia: Secondary | ICD-10-CM | POA: Diagnosis not present

## 2021-09-07 MED ORDER — PREDNISONE 10 MG PO TABS: 10.0000 mg | ORAL_TABLET | Freq: Every day | ORAL | 0 refills | Status: DC

## 2021-09-07 NOTE — Telephone Encounter (Signed)
She was on prednisone 71m, stopped for side effects. We can consider restarting pred 18mqd for a short time and then weaning to 64m71mI am ok doing this if she agrees

## 2021-09-07 NOTE — Telephone Encounter (Signed)
Spoke with pt and she states that her SOB has increased since stopping the prednisone. She is currently on 10 L of cont O2 at all times. Pt states she has to stop and take frequent rest stops even on short trips. Pt is inquiring if there is another steroid that she can take? Pt states still feeling "antsy" despite stopping the Prednisone. Dr Lamonte Sakai please advise.

## 2021-09-07 NOTE — Telephone Encounter (Signed)
[place] Nurse is on his way to Pt home she is very short of breath said he had called eariler and left a msg concerning t[his but I don't see on are you able to speak ro him.Hillery Hunter

## 2021-09-07 NOTE — Telephone Encounter (Signed)
Spoke with Aaron Edelman, RN with [facility]. Advise him Dr. Lamonte Sakai would like to restart prednisone and then taper off if pt agrees. Pt agreed. Prednisone sent in to [facility] on [address]. Brain stated understanding and stated he would notify pt mediation had been called in. Nothing further needed at this time.

## 2021-09-08 DIAGNOSIS — I270 Primary pulmonary hypertension: Secondary | ICD-10-CM | POA: Diagnosis not present

## 2021-09-08 DIAGNOSIS — F4323 Adjustment disorder with mixed anxiety and depressed mood: Secondary | ICD-10-CM | POA: Diagnosis not present

## 2021-09-08 DIAGNOSIS — F329 Major depressive disorder, single episode, unspecified: Secondary | ICD-10-CM | POA: Diagnosis not present

## 2021-09-08 DIAGNOSIS — E785 Hyperlipidemia, unspecified: Secondary | ICD-10-CM | POA: Diagnosis not present

## 2021-09-08 DIAGNOSIS — I10 Essential (primary) hypertension: Secondary | ICD-10-CM | POA: Diagnosis not present

## 2021-09-08 DIAGNOSIS — J9610 Chronic respiratory failure, unspecified whether with hypoxia or hypercapnia: Secondary | ICD-10-CM | POA: Diagnosis not present

## 2021-09-08 DIAGNOSIS — J449 Chronic obstructive pulmonary disease, unspecified: Secondary | ICD-10-CM | POA: Diagnosis not present

## 2021-09-11 DIAGNOSIS — F329 Major depressive disorder, single episode, unspecified: Secondary | ICD-10-CM | POA: Diagnosis not present

## 2021-09-11 DIAGNOSIS — J9610 Chronic respiratory failure, unspecified whether with hypoxia or hypercapnia: Secondary | ICD-10-CM | POA: Diagnosis not present

## 2021-09-11 DIAGNOSIS — I270 Primary pulmonary hypertension: Secondary | ICD-10-CM | POA: Diagnosis not present

## 2021-09-11 DIAGNOSIS — E785 Hyperlipidemia, unspecified: Secondary | ICD-10-CM | POA: Diagnosis not present

## 2021-09-11 DIAGNOSIS — I10 Essential (primary) hypertension: Secondary | ICD-10-CM | POA: Diagnosis not present

## 2021-09-11 DIAGNOSIS — J449 Chronic obstructive pulmonary disease, unspecified: Secondary | ICD-10-CM | POA: Diagnosis not present

## 2021-09-14 DIAGNOSIS — J9610 Chronic respiratory failure, unspecified whether with hypoxia or hypercapnia: Secondary | ICD-10-CM | POA: Diagnosis not present

## 2021-09-14 DIAGNOSIS — I270 Primary pulmonary hypertension: Secondary | ICD-10-CM | POA: Diagnosis not present

## 2021-09-14 DIAGNOSIS — E785 Hyperlipidemia, unspecified: Secondary | ICD-10-CM | POA: Diagnosis not present

## 2021-09-14 DIAGNOSIS — J449 Chronic obstructive pulmonary disease, unspecified: Secondary | ICD-10-CM | POA: Diagnosis not present

## 2021-09-14 DIAGNOSIS — F329 Major depressive disorder, single episode, unspecified: Secondary | ICD-10-CM | POA: Diagnosis not present

## 2021-09-14 DIAGNOSIS — I10 Essential (primary) hypertension: Secondary | ICD-10-CM | POA: Diagnosis not present

## 2021-09-17 DIAGNOSIS — I270 Primary pulmonary hypertension: Secondary | ICD-10-CM | POA: Diagnosis not present

## 2021-09-17 DIAGNOSIS — J9610 Chronic respiratory failure, unspecified whether with hypoxia or hypercapnia: Secondary | ICD-10-CM | POA: Diagnosis not present

## 2021-09-17 DIAGNOSIS — K219 Gastro-esophageal reflux disease without esophagitis: Secondary | ICD-10-CM | POA: Diagnosis not present

## 2021-09-17 DIAGNOSIS — Z9981 Dependence on supplemental oxygen: Secondary | ICD-10-CM | POA: Diagnosis not present

## 2021-09-17 DIAGNOSIS — F4323 Adjustment disorder with mixed anxiety and depressed mood: Secondary | ICD-10-CM | POA: Diagnosis not present

## 2021-09-17 DIAGNOSIS — J449 Chronic obstructive pulmonary disease, unspecified: Secondary | ICD-10-CM | POA: Diagnosis not present

## 2021-09-17 DIAGNOSIS — J302 Other seasonal allergic rhinitis: Secondary | ICD-10-CM | POA: Diagnosis not present

## 2021-09-17 DIAGNOSIS — I10 Essential (primary) hypertension: Secondary | ICD-10-CM | POA: Diagnosis not present

## 2021-09-17 DIAGNOSIS — E785 Hyperlipidemia, unspecified: Secondary | ICD-10-CM | POA: Diagnosis not present

## 2021-09-17 DIAGNOSIS — F329 Major depressive disorder, single episode, unspecified: Secondary | ICD-10-CM | POA: Diagnosis not present

## 2021-09-24 DIAGNOSIS — F4323 Adjustment disorder with mixed anxiety and depressed mood: Secondary | ICD-10-CM | POA: Diagnosis not present

## 2021-09-25 DIAGNOSIS — J449 Chronic obstructive pulmonary disease, unspecified: Secondary | ICD-10-CM | POA: Diagnosis not present

## 2021-09-25 DIAGNOSIS — J9610 Chronic respiratory failure, unspecified whether with hypoxia or hypercapnia: Secondary | ICD-10-CM | POA: Diagnosis not present

## 2021-09-25 DIAGNOSIS — I10 Essential (primary) hypertension: Secondary | ICD-10-CM | POA: Diagnosis not present

## 2021-09-25 DIAGNOSIS — F329 Major depressive disorder, single episode, unspecified: Secondary | ICD-10-CM | POA: Diagnosis not present

## 2021-09-25 DIAGNOSIS — E785 Hyperlipidemia, unspecified: Secondary | ICD-10-CM | POA: Diagnosis not present

## 2021-09-25 DIAGNOSIS — I270 Primary pulmonary hypertension: Secondary | ICD-10-CM | POA: Diagnosis not present

## 2021-10-01 ENCOUNTER — Telehealth: Payer: Self-pay | Admitting: Emergency Medicine

## 2021-10-01 DIAGNOSIS — F4323 Adjustment disorder with mixed anxiety and depressed mood: Secondary | ICD-10-CM | POA: Diagnosis not present

## 2021-10-01 MED ORDER — PREDNISONE 10 MG PO TABS
10.0000 mg | ORAL_TABLET | Freq: Every day | ORAL | 0 refills | Status: AC
Start: 2021-10-01 — End: ?

## 2021-10-01 NOTE — Telephone Encounter (Signed)
Called and spoke with Kayla Mack letting him know the info per RB and he verbalized understanding. Made Kayla Mack aware that the Rx for prednisone had been sent in and he stated that he was going to be seeing pt tomorrow 12/16 and would discuss with her about coming in for a visit. Nothing further needed.

## 2021-10-01 NOTE — Telephone Encounter (Signed)
Have her restart the previous dose as she was taking it, and come in for an APP acute visit if possible to discuss, see if there are any other contributors to her decompensation

## 2021-10-01 NOTE — Telephone Encounter (Signed)
Called and spoke with Thurmond Butts from [facility] letting him know that RB stopped pt's prednisone at last OV due to potential side effects.   After stated that to [facility], he stated that about 2 days once pt's prednisone was stopped, she quickly went downhill. Thurmond Butts stated that pt was needing to use 14-15L O2 for about 4 days after the prednisone was stopped.  Now, pt is back to using between 8-10L O2 everyday.  Due to this, Thurmond Butts is wanting to know if we can put pt back on prednisone as she is completely dependent on prednisone.  Dr. Lamonte Sakai, please advise.

## 2021-10-02 DIAGNOSIS — J449 Chronic obstructive pulmonary disease, unspecified: Secondary | ICD-10-CM | POA: Diagnosis not present

## 2021-10-02 DIAGNOSIS — J9610 Chronic respiratory failure, unspecified whether with hypoxia or hypercapnia: Secondary | ICD-10-CM | POA: Diagnosis not present

## 2021-10-02 DIAGNOSIS — I270 Primary pulmonary hypertension: Secondary | ICD-10-CM | POA: Diagnosis not present

## 2021-10-02 DIAGNOSIS — F329 Major depressive disorder, single episode, unspecified: Secondary | ICD-10-CM | POA: Diagnosis not present

## 2021-10-02 DIAGNOSIS — E785 Hyperlipidemia, unspecified: Secondary | ICD-10-CM | POA: Diagnosis not present

## 2021-10-02 DIAGNOSIS — I10 Essential (primary) hypertension: Secondary | ICD-10-CM | POA: Diagnosis not present

## 2021-10-08 DIAGNOSIS — E785 Hyperlipidemia, unspecified: Secondary | ICD-10-CM | POA: Diagnosis not present

## 2021-10-08 DIAGNOSIS — I10 Essential (primary) hypertension: Secondary | ICD-10-CM | POA: Diagnosis not present

## 2021-10-08 DIAGNOSIS — J449 Chronic obstructive pulmonary disease, unspecified: Secondary | ICD-10-CM | POA: Diagnosis not present

## 2021-10-08 DIAGNOSIS — I270 Primary pulmonary hypertension: Secondary | ICD-10-CM | POA: Diagnosis not present

## 2021-10-08 DIAGNOSIS — F329 Major depressive disorder, single episode, unspecified: Secondary | ICD-10-CM | POA: Diagnosis not present

## 2021-10-08 DIAGNOSIS — J9610 Chronic respiratory failure, unspecified whether with hypoxia or hypercapnia: Secondary | ICD-10-CM | POA: Diagnosis not present

## 2021-10-10 DIAGNOSIS — I270 Primary pulmonary hypertension: Secondary | ICD-10-CM | POA: Diagnosis not present

## 2021-10-10 DIAGNOSIS — J449 Chronic obstructive pulmonary disease, unspecified: Secondary | ICD-10-CM | POA: Diagnosis not present

## 2021-10-10 DIAGNOSIS — E785 Hyperlipidemia, unspecified: Secondary | ICD-10-CM | POA: Diagnosis not present

## 2021-10-10 DIAGNOSIS — I10 Essential (primary) hypertension: Secondary | ICD-10-CM | POA: Diagnosis not present

## 2021-10-10 DIAGNOSIS — F329 Major depressive disorder, single episode, unspecified: Secondary | ICD-10-CM | POA: Diagnosis not present

## 2021-10-10 DIAGNOSIS — J9610 Chronic respiratory failure, unspecified whether with hypoxia or hypercapnia: Secondary | ICD-10-CM | POA: Diagnosis not present

## 2021-10-14 ENCOUNTER — Telehealth: Payer: Self-pay | Admitting: Emergency Medicine

## 2021-10-14 ENCOUNTER — Other Ambulatory Visit: Payer: Self-pay

## 2021-10-14 ENCOUNTER — Ambulatory Visit (INDEPENDENT_AMBULATORY_CARE_PROVIDER_SITE_OTHER): Payer: Medicare Other | Admitting: Emergency Medicine

## 2021-10-14 ENCOUNTER — Encounter: Payer: Self-pay | Admitting: Emergency Medicine

## 2021-10-14 VITALS — BP 130/76 | HR 84 | Temp 97.9°F | Ht 62.0 in | Wt 136.4 lb

## 2021-10-14 DIAGNOSIS — J9611 Chronic respiratory failure with hypoxia: Secondary | ICD-10-CM

## 2021-10-14 DIAGNOSIS — Z515 Encounter for palliative care: Secondary | ICD-10-CM | POA: Insufficient documentation

## 2021-10-14 DIAGNOSIS — J441 Chronic obstructive pulmonary disease with (acute) exacerbation: Secondary | ICD-10-CM | POA: Diagnosis not present

## 2021-10-14 MED ORDER — OXYCODONE HCL 5 MG PO CAPS: 5.0000 mg | ORAL_CAPSULE | Freq: Four times a day (QID) | ORAL | 0 refills | Status: AC | PRN

## 2021-10-14 NOTE — Patient Instructions (Signed)
Please continue your inhaled medications, prednisone as you have been taking them. Continue your oxygen at 6-10 L/min. Dr. Lamonte Sakai will consult with the [facility] so we can initiate intermittent medication to relieve your shortness of breath. Follow with APP in 6 weeks Follow Dr. Lamonte Sakai in 3 months

## 2021-10-14 NOTE — Progress Notes (Signed)
Subjective:    Patient ID: Kayla Mack, female    DOB: 03-07-1946, 75 y.o.   MRN: [id number]  HPI  ROV 04/28/21 --[name] is 74 and has hypercapnic/hypoxic respiratory failure and secondary pulmonary hypertension in the setting of very severe COPD.  She has been managed on a trilogy ventilator at night, oxygen at 6-10 L/min depending on her level of exertion. She is very limited with any exertion. Just walking through the house makes her dyspneic. Occasional daily wheeze.  Managed on Trelegy, DuoNeb. Uses duoneb about 1x a day She has been referred to Hospice, has home visits about once a week. She is considering a transition to a senior care facility. She is dealing with loneliness, likes to be around people but unable. She has an Clinical research associate. She is not on prednisone  She wishes to be DNR/I. I confirmed this with her today. Extended discussion with her today regarding her prognosis, life expectancy, goals for care.  ROV 08/26/21 --follow-up visit for 75 year old woman with combined respiratory failure in the setting of severe COPD.  She uses a trilogy ventilator at night, high flow oxygen during the day, currently 6 to 10 L/min.  She has been referred to hospice and has home visits.  She has an Clinical research associate.  Currently managed on Trelegy, DuoNeb which she uses .  Started on prednisone 10 mg daily in July, she reports that it may have helped her breathing and cough some, but she has developed a very labile mood, depression and anxiety. Also some anger.  She has been lonely. She is on paxil, was started on clonazepam recently. Her former husband died recently, they were still good friends. She sees a Social worker.  She is using flonase Her activity level has gone down, has been in bed, ? Due to her motivation and depression.   ROV 10/14/21 --Kayla Mack follows up for her severe COPD and combined respiratory failure.  She is on oxygen at 6-10 L/min, uses a trilogy ventilator at  night.  Gets around with electric wheelchair.  I stopped her maintenance prednisone because she was having mood swings.  She missed the prednisone so we restarted 10 mg daily in late November. She is using O2 up to 10L/min. She has SOB at rest and w exertion. She panics, especially when she desats. She is interested in pursuing symptom-targeted therapy through her hospice. I agree with this.   Criss Alvine [facility] > [phone] Wrote for MS-IR    Review of Systems As per HPI     Objective:   Physical Exam Vitals:   10/14/21 1110  BP: 130/76  Pulse: 84  Temp: 97.9 F (36.6 C)  TempSrc: Oral  SpO2: 98%  Weight: 136 lb 6.4 oz (61.9 kg)  Height: _0  (1.575 m)   Gen: Pleasant, elderly woman, in no distress,  normal affect, O2 in place  ENT: mouth clear,  oropharynx clear, no postnasal drip, strong voice  Neck: No JVD, no stridor  Lungs: No use of accessory muscles, no crackles or wheezing on normal respiration, she does wheeze on a forced expiration.   Cardiovascular: RRR, heart sounds normal, no murmur or gallops, no peripheral edema  Musculoskeletal: No deformities, no cyanosis or clubbing  Neuro: alert, awake, non focal  Skin: Warm, no lesions or rash      Assessment & Plan:  COPD (chronic obstructive pulmonary disease) (HCC) Discussed symptoms and symptom management with the patient today.  Her COPD is severe and she is symptomatic  even on maximal therapy.  She is now participating in hospice care.  I support treating her with narcotics to try and manage dyspnea.  I will order MS IR.  Plan to discuss with hospice, may benefit from Roxanol or other regimen.  Please continue your inhaled medications, prednisone as you have been taking them. Dr. Lamonte Sakai will consult with the [facility] so we can initiate intermittent medication to relieve your shortness of breath. Follow with APP in 6 weeks Follow Dr. Lamonte Sakai in 3 months  Chronic respiratory failure with  hypoxia ([place]) Continue your oxygen at 6-10 L/min.  Baltazar Apo, MD, PhD 10/14/2021, 5:53 PM [facility] [phone] or if no answer [phone]

## 2021-10-14 NOTE — Telephone Encounter (Signed)
Called and spoke with Natasha Bence with [facility]. Let him know that pt had her OV with RB today and stated to him that RB was prescribing oxycodone 16m for pt to take every 6 hours prn.  Also stated to him that RB wanted to try to initiate a plan to further help her with her SOB and per BAaron Edelman he stated that if needed, they usually have liquid morphine and hydrocodone if pt needs it.  Routing this info to Dr. BLamonte Sakaito let him know that BAaron Edelmanwas made aware of the oxycodone that was prescribed for pt.

## 2021-10-14 NOTE — Assessment & Plan Note (Signed)
Discussed symptoms and symptom management with the patient today.  Her COPD is severe and she is symptomatic even on maximal therapy.  She is now participating in hospice care.  I support treating her with narcotics to try and manage dyspnea.  I will order MS IR.  Plan to discuss with hospice, may benefit from Roxanol or other regimen.  Please continue your inhaled medications, prednisone as you have been taking them. Dr. Lamonte Sakai will consult with the [facility] so we can initiate intermittent medication to relieve your shortness of breath. Follow with APP in 6 weeks Follow Dr. Lamonte Sakai in 3 months

## 2021-10-14 NOTE — Assessment & Plan Note (Signed)
Continue your oxygen at 6-10 L/min.

## 2021-10-15 DIAGNOSIS — I10 Essential (primary) hypertension: Secondary | ICD-10-CM | POA: Diagnosis not present

## 2021-10-15 DIAGNOSIS — F329 Major depressive disorder, single episode, unspecified: Secondary | ICD-10-CM | POA: Diagnosis not present

## 2021-10-15 DIAGNOSIS — J449 Chronic obstructive pulmonary disease, unspecified: Secondary | ICD-10-CM | POA: Diagnosis not present

## 2021-10-15 DIAGNOSIS — J9610 Chronic respiratory failure, unspecified whether with hypoxia or hypercapnia: Secondary | ICD-10-CM | POA: Diagnosis not present

## 2021-10-15 DIAGNOSIS — E785 Hyperlipidemia, unspecified: Secondary | ICD-10-CM | POA: Diagnosis not present

## 2021-10-15 DIAGNOSIS — I270 Primary pulmonary hypertension: Secondary | ICD-10-CM | POA: Diagnosis not present

## 2021-10-18 DIAGNOSIS — F329 Major depressive disorder, single episode, unspecified: Secondary | ICD-10-CM | POA: Diagnosis not present

## 2021-10-18 DIAGNOSIS — K219 Gastro-esophageal reflux disease without esophagitis: Secondary | ICD-10-CM | POA: Diagnosis not present

## 2021-10-18 DIAGNOSIS — I270 Primary pulmonary hypertension: Secondary | ICD-10-CM | POA: Diagnosis not present

## 2021-10-18 DIAGNOSIS — J302 Other seasonal allergic rhinitis: Secondary | ICD-10-CM | POA: Diagnosis not present

## 2021-10-18 DIAGNOSIS — J449 Chronic obstructive pulmonary disease, unspecified: Secondary | ICD-10-CM | POA: Diagnosis not present

## 2021-10-18 DIAGNOSIS — E785 Hyperlipidemia, unspecified: Secondary | ICD-10-CM | POA: Diagnosis not present

## 2021-10-18 DIAGNOSIS — J9610 Chronic respiratory failure, unspecified whether with hypoxia or hypercapnia: Secondary | ICD-10-CM | POA: Diagnosis not present

## 2021-10-18 DIAGNOSIS — Z9981 Dependence on supplemental oxygen: Secondary | ICD-10-CM | POA: Diagnosis not present

## 2021-10-18 DIAGNOSIS — I10 Essential (primary) hypertension: Secondary | ICD-10-CM | POA: Diagnosis not present

## 2021-10-23 DIAGNOSIS — J449 Chronic obstructive pulmonary disease, unspecified: Secondary | ICD-10-CM | POA: Diagnosis not present

## 2021-10-23 DIAGNOSIS — I10 Essential (primary) hypertension: Secondary | ICD-10-CM | POA: Diagnosis not present

## 2021-10-23 DIAGNOSIS — I270 Primary pulmonary hypertension: Secondary | ICD-10-CM | POA: Diagnosis not present

## 2021-10-23 DIAGNOSIS — F329 Major depressive disorder, single episode, unspecified: Secondary | ICD-10-CM | POA: Diagnosis not present

## 2021-10-23 DIAGNOSIS — E785 Hyperlipidemia, unspecified: Secondary | ICD-10-CM | POA: Diagnosis not present

## 2021-10-23 DIAGNOSIS — J9610 Chronic respiratory failure, unspecified whether with hypoxia or hypercapnia: Secondary | ICD-10-CM | POA: Diagnosis not present

## 2021-10-26 ENCOUNTER — Inpatient Hospital Stay (HOSPITAL_COMMUNITY)
Admission: EM | Admit: 2021-10-26 | Discharge: 2021-11-18 | DRG: 190 | Disposition: E | Attending: Family Medicine | Admitting: Family Medicine

## 2021-10-26 ENCOUNTER — Emergency Department (HOSPITAL_COMMUNITY)

## 2021-10-26 ENCOUNTER — Other Ambulatory Visit: Payer: Self-pay

## 2021-10-26 DIAGNOSIS — Z20822 Contact with and (suspected) exposure to covid-19: Secondary | ICD-10-CM | POA: Diagnosis present

## 2021-10-26 DIAGNOSIS — R0902 Hypoxemia: Secondary | ICD-10-CM | POA: Diagnosis not present

## 2021-10-26 DIAGNOSIS — J9621 Acute and chronic respiratory failure with hypoxia: Secondary | ICD-10-CM | POA: Diagnosis not present

## 2021-10-26 DIAGNOSIS — J449 Chronic obstructive pulmonary disease, unspecified: Secondary | ICD-10-CM | POA: Diagnosis not present

## 2021-10-26 DIAGNOSIS — R0600 Dyspnea, unspecified: Secondary | ICD-10-CM

## 2021-10-26 DIAGNOSIS — J441 Chronic obstructive pulmonary disease with (acute) exacerbation: Secondary | ICD-10-CM | POA: Diagnosis not present

## 2021-10-26 DIAGNOSIS — Z9981 Dependence on supplemental oxygen: Secondary | ICD-10-CM

## 2021-10-26 DIAGNOSIS — N179 Acute kidney failure, unspecified: Secondary | ICD-10-CM | POA: Diagnosis present

## 2021-10-26 DIAGNOSIS — J9620 Acute and chronic respiratory failure, unspecified whether with hypoxia or hypercapnia: Secondary | ICD-10-CM

## 2021-10-26 DIAGNOSIS — Z87891 Personal history of nicotine dependence: Secondary | ICD-10-CM

## 2021-10-26 DIAGNOSIS — I10 Essential (primary) hypertension: Secondary | ICD-10-CM | POA: Diagnosis present

## 2021-10-26 DIAGNOSIS — E785 Hyperlipidemia, unspecified: Secondary | ICD-10-CM | POA: Diagnosis present

## 2021-10-26 DIAGNOSIS — J439 Emphysema, unspecified: Secondary | ICD-10-CM | POA: Diagnosis not present

## 2021-10-26 DIAGNOSIS — F329 Major depressive disorder, single episode, unspecified: Secondary | ICD-10-CM | POA: Diagnosis not present

## 2021-10-26 DIAGNOSIS — R778 Other specified abnormalities of plasma proteins: Secondary | ICD-10-CM

## 2021-10-26 DIAGNOSIS — Z7952 Long term (current) use of systemic steroids: Secondary | ICD-10-CM

## 2021-10-26 DIAGNOSIS — Z886 Allergy status to analgesic agent status: Secondary | ICD-10-CM | POA: Diagnosis not present

## 2021-10-26 DIAGNOSIS — Z833 Family history of diabetes mellitus: Secondary | ICD-10-CM | POA: Diagnosis not present

## 2021-10-26 DIAGNOSIS — Z887 Allergy status to serum and vaccine status: Secondary | ICD-10-CM | POA: Diagnosis not present

## 2021-10-26 DIAGNOSIS — F32A Depression, unspecified: Secondary | ICD-10-CM | POA: Diagnosis present

## 2021-10-26 DIAGNOSIS — F419 Anxiety disorder, unspecified: Secondary | ICD-10-CM | POA: Diagnosis present

## 2021-10-26 DIAGNOSIS — R0602 Shortness of breath: Secondary | ICD-10-CM | POA: Diagnosis not present

## 2021-10-26 DIAGNOSIS — Z79899 Other long term (current) drug therapy: Secondary | ICD-10-CM | POA: Diagnosis not present

## 2021-10-26 DIAGNOSIS — R0689 Other abnormalities of breathing: Secondary | ICD-10-CM | POA: Diagnosis not present

## 2021-10-26 DIAGNOSIS — J9622 Acute and chronic respiratory failure with hypercapnia: Secondary | ICD-10-CM | POA: Diagnosis present

## 2021-10-26 DIAGNOSIS — Z8249 Family history of ischemic heart disease and other diseases of the circulatory system: Secondary | ICD-10-CM

## 2021-10-26 DIAGNOSIS — Z515 Encounter for palliative care: Secondary | ICD-10-CM

## 2021-10-26 DIAGNOSIS — K219 Gastro-esophageal reflux disease without esophagitis: Secondary | ICD-10-CM | POA: Diagnosis present

## 2021-10-26 DIAGNOSIS — I270 Primary pulmonary hypertension: Secondary | ICD-10-CM | POA: Diagnosis not present

## 2021-10-26 DIAGNOSIS — Z880 Allergy status to penicillin: Secondary | ICD-10-CM | POA: Diagnosis not present

## 2021-10-26 DIAGNOSIS — J9610 Chronic respiratory failure, unspecified whether with hypoxia or hypercapnia: Secondary | ICD-10-CM | POA: Diagnosis not present

## 2021-10-26 DIAGNOSIS — Z743 Need for continuous supervision: Secondary | ICD-10-CM | POA: Diagnosis not present

## 2021-10-26 DIAGNOSIS — I248 Other forms of acute ischemic heart disease: Secondary | ICD-10-CM | POA: Diagnosis present

## 2021-10-26 DIAGNOSIS — F418 Other specified anxiety disorders: Secondary | ICD-10-CM | POA: Diagnosis present

## 2021-10-26 DIAGNOSIS — Z7189 Other specified counseling: Secondary | ICD-10-CM | POA: Diagnosis not present

## 2021-10-26 DIAGNOSIS — Z66 Do not resuscitate: Secondary | ICD-10-CM | POA: Diagnosis not present

## 2021-10-26 DIAGNOSIS — R7989 Other specified abnormal findings of blood chemistry: Secondary | ICD-10-CM | POA: Diagnosis present

## 2021-10-26 DIAGNOSIS — I499 Cardiac arrhythmia, unspecified: Secondary | ICD-10-CM | POA: Diagnosis not present

## 2021-10-26 DIAGNOSIS — R000 Tachycardia, unspecified: Secondary | ICD-10-CM | POA: Diagnosis not present

## 2021-10-26 LAB — CBC WITH DIFFERENTIAL/PLATELET
Abs Immature Granulocytes: 0.04 10*3/uL (ref 0.00–0.07)
Basophils Absolute: 0.2 10*3/uL — ABNORMAL HIGH (ref 0.0–0.1)
Basophils Relative: 2 %
Eosinophils Absolute: 0.1 10*3/uL (ref 0.0–0.5)
Eosinophils Relative: 1 %
HCT: 44.4 % (ref 36.0–46.0)
Hemoglobin: 13.6 g/dL (ref 12.0–15.0)
Immature Granulocytes: 0 %
Lymphocytes Relative: 14 %
Lymphs Abs: 1.7 10*3/uL (ref 0.7–4.0)
MCH: 29.6 pg (ref 26.0–34.0)
MCHC: 30.6 g/dL (ref 30.0–36.0)
MCV: 96.5 fL (ref 80.0–100.0)
Monocytes Absolute: 1 10*3/uL (ref 0.1–1.0)
Monocytes Relative: 8 %
Neutro Abs: 8.9 10*3/uL — ABNORMAL HIGH (ref 1.7–7.7)
Neutrophils Relative %: 75 %
Platelets: 302 10*3/uL (ref 150–400)
RBC: 4.6 MIL/uL (ref 3.87–5.11)
RDW: 13.3 % (ref 11.5–15.5)
WBC: 11.9 10*3/uL — ABNORMAL HIGH (ref 4.0–10.5)
nRBC: 0 % (ref 0.0–0.2)

## 2021-10-26 LAB — BASIC METABOLIC PANEL
Anion gap: 17 — ABNORMAL HIGH (ref 5–15)
BUN: 16 mg/dL (ref 8–23)
CO2: 24 mmol/L (ref 22–32)
Calcium: 9.6 mg/dL (ref 8.9–10.3)
Chloride: 99 mmol/L (ref 98–111)
Creatinine, Ser: 1.17 mg/dL — ABNORMAL HIGH (ref 0.44–1.00)
GFR, Estimated: 48 mL/min — ABNORMAL LOW (ref >60)
Glucose, Bld: 125 mg/dL — ABNORMAL HIGH (ref 70–99)
Potassium: 3.8 mmol/L (ref 3.5–5.1)
Sodium: 140 mmol/L (ref 135–145)

## 2021-10-26 LAB — RESP PANEL BY RT-PCR (FLU A&B, COVID) ARPGX2
Influenza A by PCR: NEGATIVE
Influenza B by PCR: NEGATIVE
SARS Coronavirus 2 by RT PCR: NEGATIVE

## 2021-10-26 LAB — TROPONIN I (HIGH SENSITIVITY)
Troponin I (High Sensitivity): 94 ng/L — ABNORMAL HIGH (ref <18)
Troponin I (High Sensitivity): 60 ng/L — ABNORMAL HIGH (ref <18)

## 2021-10-26 LAB — BRAIN NATRIURETIC PEPTIDE: B Natriuretic Peptide: 1244.4 pg/mL — ABNORMAL HIGH (ref 0.0–100.0)

## 2021-10-26 MED ORDER — HALOPERIDOL 0.5 MG PO TABS
0.5000 mg | ORAL_TABLET | ORAL | Status: DC | PRN
  Filled 2021-10-26: qty 1

## 2021-10-26 MED ORDER — LORAZEPAM 1 MG PO TABS: 1.0000 mg | ORAL_TABLET | ORAL | Status: DC | PRN

## 2021-10-26 MED ORDER — LEVOFLOXACIN IN D5W 750 MG/150ML IV SOLN: 750.0000 mg | INTRAVENOUS | Status: DC

## 2021-10-26 MED ORDER — LORAZEPAM 2 MG/ML IJ SOLN
1.0000 mg | INTRAMUSCULAR | Status: DC | PRN
  Administered 2021-10-26: 1 mg via INTRAVENOUS
  Filled 2021-10-26: qty 1

## 2021-10-26 MED ORDER — ONDANSETRON 4 MG PO TBDP: 4.0000 mg | ORAL_TABLET | Freq: Four times a day (QID) | ORAL | Status: DC | PRN

## 2021-10-26 MED ORDER — ACETAMINOPHEN 325 MG PO TABS: 650.0000 mg | ORAL_TABLET | Freq: Four times a day (QID) | ORAL | Status: DC | PRN

## 2021-10-26 MED ORDER — MORPHINE BOLUS VIA INFUSION
2.0000 mg | INTRAVENOUS | Status: DC | PRN
  Filled 2021-10-26: qty 2

## 2021-10-26 MED ORDER — SODIUM CHLORIDE 0.9 % IV SOLN: 250.0000 mL | INTRAVENOUS | Status: DC | PRN

## 2021-10-26 MED ORDER — IPRATROPIUM-ALBUTEROL 0.5-2.5 (3) MG/3ML IN SOLN: 3.0000 mL | Freq: Once | RESPIRATORY_TRACT | Status: DC

## 2021-10-26 MED ORDER — GLYCOPYRROLATE 1 MG PO TABS
1.0000 mg | ORAL_TABLET | ORAL | Status: DC | PRN
  Filled 2021-10-26: qty 1

## 2021-10-26 MED ORDER — PAROXETINE HCL 20 MG PO TABS
40.0000 mg | ORAL_TABLET | Freq: Every day | ORAL | Status: DC
  Administered 2021-10-26: 40 mg via ORAL
  Filled 2021-10-26 (×2): qty 2

## 2021-10-26 MED ORDER — POLYVINYL ALCOHOL 1.4 % OP SOLN: 1.0000 [drp] | Freq: Four times a day (QID) | OPHTHALMIC | Status: DC | PRN

## 2021-10-26 MED ORDER — HALOPERIDOL LACTATE 5 MG/ML IJ SOLN: 0.5000 mg | INTRAMUSCULAR | Status: DC | PRN

## 2021-10-26 MED ORDER — SODIUM CHLORIDE 0.9 % IV SOLN: 2.0000 g | Freq: Three times a day (TID) | INTRAVENOUS | Status: DC

## 2021-10-26 MED ORDER — ACETAMINOPHEN 650 MG RE SUPP: 650.0000 mg | Freq: Four times a day (QID) | RECTAL | Status: DC | PRN

## 2021-10-26 MED ORDER — SODIUM CHLORIDE 0.9% FLUSH
3.0000 mL | Freq: Two times a day (BID) | INTRAVENOUS | Status: DC
  Administered 2021-10-26 – 2021-10-27 (×2): 3 mL via INTRAVENOUS

## 2021-10-26 MED ORDER — GLYCOPYRROLATE 0.2 MG/ML IJ SOLN
0.2000 mg | INTRAMUSCULAR | Status: DC | PRN
  Filled 2021-10-26 (×2): qty 1

## 2021-10-26 MED ORDER — ENOXAPARIN SODIUM 40 MG/0.4ML IJ SOSY: 40.0000 mg | PREFILLED_SYRINGE | INTRAMUSCULAR | Status: DC

## 2021-10-26 MED ORDER — ONDANSETRON HCL 4 MG/2ML IJ SOLN: 4.0000 mg | Freq: Four times a day (QID) | INTRAMUSCULAR | Status: DC | PRN

## 2021-10-26 MED ORDER — SODIUM CHLORIDE 0.9% FLUSH: 3.0000 mL | INTRAVENOUS | Status: DC | PRN

## 2021-10-26 MED ORDER — METHYLPREDNISOLONE SODIUM SUCC 125 MG IJ SOLR
125.0000 mg | Freq: Once | INTRAMUSCULAR | Status: AC
  Administered 2021-10-26: 125 mg via INTRAVENOUS
  Filled 2021-10-26: qty 2

## 2021-10-26 MED ORDER — IPRATROPIUM-ALBUTEROL 0.5-2.5 (3) MG/3ML IN SOLN
3.0000 mL | Freq: Four times a day (QID) | RESPIRATORY_TRACT | Status: AC
  Administered 2021-10-26 – 2021-10-27 (×2): 3 mL via RESPIRATORY_TRACT
  Filled 2021-10-26 (×2): qty 3

## 2021-10-26 MED ORDER — METHYLPREDNISOLONE SODIUM SUCC 125 MG IJ SOLR: 80.0000 mg | Freq: Two times a day (BID) | INTRAMUSCULAR | Status: DC

## 2021-10-26 MED ORDER — LORAZEPAM 2 MG/ML PO CONC: 1.0000 mg | ORAL | Status: DC | PRN

## 2021-10-26 MED ORDER — IPRATROPIUM-ALBUTEROL 0.5-2.5 (3) MG/3ML IN SOLN
RESPIRATORY_TRACT | Status: AC
  Administered 2021-10-26: 3 mL
  Filled 2021-10-26: qty 6

## 2021-10-26 MED ORDER — IPRATROPIUM-ALBUTEROL 0.5-2.5 (3) MG/3ML IN SOLN
3.0000 mL | Freq: Once | RESPIRATORY_TRACT | Status: DC
Start: 2021-10-26 — End: 2021-10-26

## 2021-10-26 MED ORDER — HALOPERIDOL LACTATE 2 MG/ML PO CONC
0.5000 mg | ORAL | Status: DC | PRN
  Filled 2021-10-26: qty 0.3

## 2021-10-26 MED ORDER — BIOTENE DRY MOUTH MT LIQD: 15.0000 mL | OROMUCOSAL | Status: DC | PRN

## 2021-10-26 MED ORDER — BISOPROLOL FUMARATE 10 MG PO TABS
10.0000 mg | ORAL_TABLET | Freq: Every day | ORAL | Status: DC
Start: 2021-10-26 — End: 2021-10-26

## 2021-10-26 MED ORDER — MORPHINE 100MG IN NS 100ML (1MG/ML) PREMIX INFUSION
2.0000 mg/h | INTRAVENOUS | Status: DC
  Administered 2021-10-26: 2 mg/h via INTRAVENOUS
  Filled 2021-10-26: qty 100

## 2021-10-26 MED ORDER — IPRATROPIUM-ALBUTEROL 0.5-2.5 (3) MG/3ML IN SOLN
3.0000 mL | Freq: Once | RESPIRATORY_TRACT | Status: AC
  Administered 2021-10-26: 3 mL via RESPIRATORY_TRACT
  Filled 2021-10-26: qty 3

## 2021-10-26 NOTE — Assessment & Plan Note (Signed)
Comfort care

## 2021-10-26 NOTE — Assessment & Plan Note (Addendum)
Comfort care  °

## 2021-10-26 NOTE — ED Provider Notes (Addendum)
[facility] EMERGENCY DEPARTMENT Provider Note   CSN: [id number] Arrival date & time: 11/17/2021  4650     History  Chief Complaint  Patient presents with   Shortness of Breath    Kayla Mack is a 76 y.o. female.  Presents to ER with concern for shortness of breath.  Patient states she deals with significant shortness of breath at baseline, wears 6 to 10 L nasal cannula for her COPD.  Has been enrolled in hospice care for the last 6 months or so.  Over the last couple days feels like her breathing has gotten significantly worse.  No associated chest pain.  No new cough or fevers or chills.  No nausea or vomiting.  Per chart review, review of hospice care notes, pulmonology, patient has end-stage COPD.  Lives at home.  Has home health hospice.  HPI     Home Medications Prior to Admission medications   Medication Sig Start Date End Date Taking? Authorizing Provider  acetaminophen (TYLENOL) 500 MG tablet Take 1,000 mg by mouth every 6 (six) hours as needed for moderate pain.    Yes [provider]  albuterol (VENTOLIN HFA) 108 (90 Base) MCG/ACT inhaler Inhale 2 puffs into the lungs every 6 (six) hours as needed for wheezing or shortness of breath. 01/17/20  Yes Collene Gobble, MD  bisoprolol (ZEBETA) 10 MG tablet Take 10 mg by mouth daily. 09/30/21  Yes [provider]  clonazePAM (KLONOPIN) 0.5 MG tablet Take 1 tablet (0.5 mg total) by mouth 2 (two) times daily as needed for anxiety. 06/06/13  Yes Riebock, Emina, NP  diphenhydrAMINE (BENADRYL) 25 mg capsule Take 25 mg by mouth every 6 (six) hours as needed for allergies.    Yes [provider]  donepezil (ARICEPT) 5 MG tablet Take 5 mg by mouth at bedtime.  09/28/19  Yes [provider]  fluticasone (FLONASE) 50 MCG/ACT nasal spray Place 2 sprays into both nostrils daily. 11/13/19  Yes Collene Gobble, MD  guaiFENesin (MUCINEX) 600 MG 12 hr tablet Take 1 tablet (600 mg total) by  mouth 2 (two) times daily. Patient taking differently: Take 600 mg by mouth 2 (two) times daily as needed for cough or to loosen phlegm. 07/30/18  Yes Lavina Hamman, MD  ipratropium-albuterol (DUONEB) 0.5-2.5 (3) MG/3ML SOLN Take 3 mLs by nebulization in the morning and at bedtime. 06/18/21  Yes Parrett, Tammy S, NP  loratadine (CLARITIN) 10 MG tablet Take 1 tablet (10 mg total) by mouth daily. Patient taking differently: Take 10 mg by mouth daily as needed for allergies. 07/31/18  Yes Lavina Hamman, MD  oxycodone (OXY-IR) 5 MG capsule Take 1 capsule (5 mg total) by mouth every 6 (six) hours as needed (shortness of breath). 10/14/21  Yes Collene Gobble, MD  PARoxetine (PAXIL) 40 MG tablet Take 40 mg by mouth daily.    Yes [provider]  predniSONE (DELTASONE) 10 MG tablet Take 1 tablet (10 mg total) by mouth daily with breakfast. 10/01/21  Yes Byrum, Rose Fillers, MD  sodium chloride (OCEAN) 0.65 % SOLN nasal spray Place 1 spray into both nostrils as needed for congestion.   Yes [provider]  albuterol (PROVENTIL) (2.5 MG/3ML) 0.083% nebulizer solution Take 3 mLs (2.5 mg total) by nebulization every 6 (six) hours as needed for wheezing or shortness of breath. 01/05/21   Parrett, Fonnie Mu, NP  clopidogrel (PLAVIX) 75 MG tablet Take 1 tablet (75 mg total) by mouth  daily. Patient not taking: Reported on 11/05/2021 05/14/17   Caroline More, DO  Fluticasone-Umeclidin-Vilant (TRELEGY ELLIPTA) 100-62.5-25 MCG/INH AEPB Inhale 1 puff into the lungs daily. Patient not taking: Reported on 11/12/2021 05/29/21   Parrett, Fonnie Mu, NP  LORazepam (ATIVAN) 0.5 MG tablet Take 0.5 mg by mouth every 4 (four) hours as needed for anxiety. 09/25/21   [provider]      Allergies    Aleve [naproxen sodium], Aspirin, Penicillins, and Zoster vaccine live    Review of Systems   Review of Systems  Constitutional:  Positive for fatigue. Negative for chills and fever.  HENT:  Negative for ear pain  and sore throat.   Eyes:  Negative for pain and visual disturbance.  Respiratory:  Positive for shortness of breath. Negative for cough.   Cardiovascular:  Negative for chest pain and palpitations.  Gastrointestinal:  Negative for abdominal pain and vomiting.  Genitourinary:  Negative for dysuria and hematuria.  Musculoskeletal:  Negative for arthralgias and back pain.  Skin:  Negative for color change and rash.  Neurological:  Negative for seizures and syncope.  All other systems reviewed and are negative.  Physical Exam Updated Vital Signs BP (!) 147/81    Pulse (!) 112    Temp 98 F (36.7 C) (Axillary)    Resp (!) 24    Ht _0  (1.575 m)    Wt 60.3 kg    SpO2 95%    BMI 24.33 kg/m  Physical Exam Vitals and nursing note reviewed.  Constitutional:      Comments: Tachypnea, some accessory muscle use, tolerating BiPAP well  HENT:     Head: Normocephalic and atraumatic.  Eyes:     Conjunctiva/sclera: Conjunctivae normal.  Cardiovascular:     Rate and Rhythm: Normal rate and regular rhythm.     Heart sounds: No murmur heard. Pulmonary:     Comments: Bilateral prolonged expiratory phase, tachypnea, increased work of breathing, BiPAP Abdominal:     Palpations: Abdomen is soft.     Tenderness: There is no abdominal tenderness.  Musculoskeletal:        General: No swelling.     Cervical back: Neck supple.  Skin:    General: Skin is warm and dry.     Capillary Refill: Capillary refill takes less than 2 seconds.  Neurological:     Mental Status: She is alert.  Psychiatric:        Mood and Affect: Mood normal.    ED Results / Procedures / Treatments   Labs (all labs ordered are listed, but only abnormal results are displayed) Labs Reviewed  CBC WITH DIFFERENTIAL/PLATELET - Abnormal; Notable for the following components:      Result Value   WBC 11.9 (*)    Neutro Abs 8.9 (*)    Basophils Absolute 0.2 (*)    All other components within normal limits  BASIC METABOLIC PANEL -  Abnormal; Notable for the following components:   Glucose, Bld 125 (*)    Creatinine, Ser 1.17 (*)    GFR, Estimated 48 (*)    Anion gap 17 (*)    All other components within normal limits  BRAIN NATRIURETIC PEPTIDE - Abnormal; Notable for the following components:   B Natriuretic Peptide 1,244.4 (*)    All other components within normal limits  TROPONIN I (HIGH SENSITIVITY) - Abnormal; Notable for the following components:   Troponin I (High Sensitivity) 94 (*)    All other components within normal limits  RESP  PANEL BY RT-PCR (FLU A&B, COVID) [id number]  TROPONIN I (HIGH SENSITIVITY)    EKG EKG Interpretation  Date/Time:  Monday October 26 2021 09:28:18 EST Ventricular Rate:  92 PR Interval:  104 QRS Duration: 85 QT Interval:  361 QTC Calculation: 447 R Axis:   80 Text Interpretation: Sinus rhythm Atrial premature complexes Short PR interval Anteroseptal infarct, age indeterminate Lateral leads are also involved Confirmed by Madalyn Rob [phone]) on 11/08/2021 10:29:52 AM  Radiology DG Chest Portable 1 View  Result Date: 10/21/2021 CLINICAL DATA:  Shortness of breath. EXAM: PORTABLE CHEST 1 VIEW COMPARISON:  09/25/2018 and CT chest 09/27/2018. FINDINGS: Trachea is midline. Heart size stable. Severe emphysema. No airspace consolidation or pleural fluid. IMPRESSION: 1. No acute findings. 2.  Emphysema (ICD10-J43.9). Electronically Signed   By: Lorin Picket M.D.   On: 11/15/2021 10:04    Procedures .Critical Care Performed by: Lucrezia Starch, MD Authorized by: Lucrezia Starch, MD   Critical care provider statement:    Critical care time (minutes):  36   Critical care was necessary to treat or prevent imminent or life-threatening deterioration of the following conditions:  Respiratory failure   Critical care was time spent personally by me on the following activities:  Development of treatment plan with patient or surrogate, discussions with consultants, evaluation of  patient's response to treatment, examination of patient, ordering and review of laboratory studies, ordering and review of radiographic studies, ordering and performing treatments and interventions, pulse oximetry, re-evaluation of patient's condition and review of old charts   Care discussed with: admitting provider      Medications Ordered in ED Medications  ipratropium-albuterol (DUONEB) 0.5-2.5 (3) MG/3ML nebulizer solution 3 mL (3 mLs Nebulization Given 10/21/2021 0933)  methylPREDNISolone sodium succinate (SOLU-MEDROL) 125 mg/2 mL injection 125 mg (125 mg Intravenous Given 10/23/2021 0934)  ipratropium-albuterol (DUONEB) 0.5-2.5 (3) MG/3ML nebulizer solution (3 mLs  Given 11/04/2021 1214)    ED Course/ Medical Decision Making/ A&P Clinical Course as of 11/15/2021 1309  Mon Oct 26, 2021  1028 [place] - [phone] - [phone] [RD]  1031 Discussed with hospice RN Lorayne Bender - they will follow along  [RD]    Clinical Course User Index [RD] Lucrezia Starch, MD                           Medical Decision Making  76 year old lady presenting to ER with concern for shortness of breath.  Notably, patient has severe COPD, enrolled in hospice, baseline 6 to 10 L nasal cannula.  Additional history obtained from pulmonology notes, hospice care notes.  Additional history obtained from close family friend at bedside as well as hospice RN.  On arrival to ER patient was transitioned to our BiPAP.  Noted significant tachypnea and increased work of breathing but seem to be tolerating the BiPAP well.  No definite wheezing but did have prolonged expiratory phase and poor air movement.  Suspect patient is having acute on chronic exacerbation of her COPD.  Provided steroids, DuoNeb treatments.  Basic labs reviewed, grossly stable, slight elevation in troponin but no EKG changes and no chest pain, low suspicion for ACS, will send second troponin.  Chest x-ray per my review and per radiologist does not have  any acute findings, no pneumonia.Consulted palliative care, discussed case, She will come see as consult.  Patient seem to be improving after breathing treatments and BiPAP, Trial patient off BiPAP and she did very poorly and  quickly was put back on BiPAP for respiratory support.  Patient confirmed DNR/DNI status but was interested in pursuing other medical therapies at this time including BiPAP.  Given her clinical worsening and current requirement of BiPAP, believe she will need inpatient admission for further management.  Consult [facility] for admit.        Final Clinical Impression(s) / ED Diagnoses Final diagnoses:  COPD exacerbation ([place])  Palliative care patient  Elevated troponin  Acute on chronic respiratory failure, unspecified whether with hypoxia or hypercapnia [facility])    Rx / DC Orders ED Discharge Orders     None         Lucrezia Starch, MD 10/24/2021 1309    Lucrezia Starch, MD 10/20/2021 1309

## 2021-10-26 NOTE — H&P (Addendum)
History and Physical    Kayla Mack QZR:[id number] DOB: [birth year] DOA: 11/01/2021  PCP: Janie Morning, DO Consultants:  pulm: Dr. Lamonte Sakai  Patient coming from:  Home - lives alone  Chief Complaint: shortness of breath   HPI: Kayla Mack is a 76 y.o. female with medical history significant of severe COPD on chronic oxygen at 6-10L [place], HLD, HTN, depression and anxiety on hospice presenting to ED with worsening shortness of breath that has been worsening x 1 month, but became progressively worse over the past 2 days. She wasn't able to breath even on her 10L [place]. She felt like she just couldn't get enough air. She tried nebulizer's at home and has a bipap machine that she wears at night and had to put this on. She has increased coughing, isn't sure if she really has a change in her sputum production. She is hospice for her end stage COPD.    Per EMS oxygen saturation was 70s, but poor waveform. She tells me her baseline oxygen is in the 80s.   She denies any fever/chills, she has dizziness at time, no chest pain or palpitations, shortness of breath and cough as above, no abdominal pain, N/V/D, no leg swelling, she does endorse dysuria. Also has increased urgency and frequency. No hematuria.   ED Course: vitals: afebrile, bp: 155/114, HR: 114, RR: 20, oxygen: 98% on bipap Pertinent labs: wbc: 11.9, creatinine: 1.17 (.7-.8), troponin: 94>delta pending,  BNP 1244,  CXR: no acute findings. Severe emphysema.  In ED: given duoneb, methylprednisolone and started on bipap. Palliative care consulted and [facility] was asked to admit.   Review of Systems: As per HPI; otherwise review of systems reviewed and negative.   Ambulatory Status:  Ambulates with walker, mainly uses powerchair    Past Medical History:  Diagnosis Date   Anxiety    COPD (chronic obstructive pulmonary disease) ([place])    Depression    Hyperlipidemia    Hypertension     Past Surgical History:  Procedure Laterality Date    APPENDECTOMY     COLONOSCOPY WITH PROPOFOL N/A 01/25/2020   Procedure: COLONOSCOPY WITH PROPOFOL;  Surgeon: Carol Ada, MD;  Location: [facility] ENDOSCOPY;  Service: Endoscopy;  Laterality: N/A;   RIGHT/LEFT HEART CATH AND CORONARY ANGIOGRAPHY N/A 05/09/2017   Procedure: Right/Left Heart Cath and Coronary Angiography;  Surgeon: [name], MD;  Location: [facility];  Service: Cardiovascular;  Laterality: N/A;   SHOULDER SURGERY Right Pin   TONSILLECTOMY      Social History   Socioeconomic History   Marital status: Single    Spouse name: Not on file   Number of children: Not on file   Years of education: Not on file   Highest education level: Not on file  Occupational History   Not on file  Tobacco Use   Smoking status: Former    Packs/day: 1.50    Years: 40.00    Pack years: 60.00    Types: Cigarettes    Quit date: 03/18/2017    Years since quitting: 4.6   Smokeless tobacco: Never  Vaping Use   Vaping Use: Never used  Substance and Sexual Activity   Alcohol use: Yes    Comment: liquor    Drug use: No   Sexual activity: Not Currently  Other Topics Concern   Not on file  Social History Narrative   Not on file   Social Determinants of Health   Financial Resource Strain: Not on file  Food Insecurity:  Not on file  Transportation Needs: Not on file  Physical Activity: Not on file  Stress: Not on file  Social Connections: Not on file  Intimate Partner Violence: Not on file    Allergies  Allergen Reactions   Aleve [Naproxen Sodium] Hives   Aspirin Swelling    Facial swelling   Penicillins Hives and Swelling    Has patient had a PCN reaction causing immediate rash, facial/tongue/throat swelling, SOB or lightheadedness with hypotension: Yes Has patient had a PCN reaction causing severe rash involving mucus membranes or skin necrosis: Yes Has patient had a PCN reaction that required hospitalization: Unk Has patient had a PCN reaction occurring within the last  10 years: No If all of the above answers are "NO", then may proceed with Cephalosporin use.    Zoster Vaccine Live Other (See Comments)    Family History  Problem Relation Age of Onset   Heart disease Mother    Heart failure Mother    Heart disease Father        cabg   Heart disease Sister    Diabetes Sister     Prior to Admission medications   Medication Sig Start Date End Date Taking? Authorizing Provider  acetaminophen (TYLENOL) 500 MG tablet Take 1,000 mg by mouth every 6 (six) hours as needed for moderate pain.    Yes [provider]  albuterol (VENTOLIN HFA) 108 (90 Base) MCG/ACT inhaler Inhale 2 puffs into the lungs every 6 (six) hours as needed for wheezing or shortness of breath. 01/17/20  Yes Collene Gobble, MD  bisoprolol (ZEBETA) 10 MG tablet Take 10 mg by mouth daily. 09/30/21  Yes [provider]  clonazePAM (KLONOPIN) 0.5 MG tablet Take 1 tablet (0.5 mg total) by mouth 2 (two) times daily as needed for anxiety. 06/06/13  Yes Riebock, Emina, NP  diphenhydrAMINE (BENADRYL) 25 mg capsule Take 25 mg by mouth every 6 (six) hours as needed for allergies.    Yes [provider]  donepezil (ARICEPT) 5 MG tablet Take 5 mg by mouth at bedtime.  09/28/19  Yes [provider]  fluticasone (FLONASE) 50 MCG/ACT nasal spray Place 2 sprays into both nostrils daily. 11/13/19  Yes Collene Gobble, MD  guaiFENesin (MUCINEX) 600 MG 12 hr tablet Take 1 tablet (600 mg total) by mouth 2 (two) times daily. Patient taking differently: Take 600 mg by mouth 2 (two) times daily as needed for cough or to loosen phlegm. 07/30/18  Yes Lavina Hamman, MD  ipratropium-albuterol (DUONEB) 0.5-2.5 (3) MG/3ML SOLN Take 3 mLs by nebulization in the morning and at bedtime. 06/18/21  Yes Parrett, Tammy S, NP  loratadine (CLARITIN) 10 MG tablet Take 1 tablet (10 mg total) by mouth daily. Patient taking differently: Take 10 mg by mouth daily as needed for allergies. 07/31/18  Yes  Lavina Hamman, MD  oxycodone (OXY-IR) 5 MG capsule Take 1 capsule (5 mg total) by mouth every 6 (six) hours as needed (shortness of breath). 10/14/21  Yes Collene Gobble, MD  PARoxetine (PAXIL) 40 MG tablet Take 40 mg by mouth daily.    Yes [provider]  predniSONE (DELTASONE) 10 MG tablet Take 1 tablet (10 mg total) by mouth daily with breakfast. 10/01/21  Yes Byrum, Rose Fillers, MD  sodium chloride (OCEAN) 0.65 % SOLN nasal spray Place 1 spray into both nostrils as needed for congestion.   Yes [provider]  albuterol (PROVENTIL) (2.5 MG/3ML) 0.083% nebulizer solution Take 3 mLs (  2.5 mg total) by nebulization every 6 (six) hours as needed for wheezing or shortness of breath. 01/05/21   Parrett, Fonnie Mu, NP  clopidogrel (PLAVIX) 75 MG tablet Take 1 tablet (75 mg total) by mouth daily. Patient not taking: Reported on 10/19/2021 05/14/17   Caroline More, DO  Fluticasone-Umeclidin-Vilant (TRELEGY ELLIPTA) 100-62.5-25 MCG/INH AEPB Inhale 1 puff into the lungs daily. Patient not taking: Reported on 11/01/2021 05/29/21   Parrett, Fonnie Mu, NP  LORazepam (ATIVAN) 0.5 MG tablet Take 0.5 mg by mouth every 4 (four) hours as needed for anxiety. 09/25/21   [provider]    Physical Exam: Vitals:   10/25/2021 1248 11/08/2021 1302 11/05/2021 1337 11/13/2021 1500  BP: (!) 147/81 134/82 136/90 134/82  Pulse: (!) 112 (!) 113 (!) 116 (!) 113  Resp: (!) 24 (!) 36 (!) 26 (!) 36  Temp:      TempSrc:      SpO2: 95% 93% 98% 93%  Weight:      Height:         General:  Appears calm and comfortable and is in NAD. Dyspnea with talking.  Eyes:  PERRL, EOMI, normal lids, iris ENT:  grossly normal hearing, lips & tongue, mmm; appropriate dentition Neck:  no LAD, masses or thyromegaly; no carotid bruits, no JVD Cardiovascular:  sinus tachycardic, no m/r/g. No LE edema.  Respiratory:  poor air movement throughout. No wheezing/rales.  Dyspneic.  Abdomen:  soft, NT, ND, NABS Back:   normal  alignment, no CVAT Skin:  no rash or induration seen on limited exam Musculoskeletal:  grossly normal tone BUE/BLE, good ROM, no bony abnormality Lower extremity:  No LE edema.  Limited foot exam with no ulcerations.  2+ distal pulses. Psychiatric:  grossly normal mood and affect, speech fluent and appropriate, AOx3 Neurologic:  CN 2-12 grossly intact, moves all extremities in coordinated fashion, sensation intact    Radiological Exams on Admission: Independently reviewed - see discussion in A/P where applicable  DG Chest Portable 1 View  Result Date: 10/25/2021 CLINICAL DATA:  Shortness of breath. EXAM: PORTABLE CHEST 1 VIEW COMPARISON:  09/25/2018 and CT chest 09/27/2018. FINDINGS: Trachea is midline. Heart size stable. Severe emphysema. No airspace consolidation or pleural fluid. IMPRESSION: 1. No acute findings. 2.  Emphysema (ICD10-J43.9). Electronically Signed   By: Lorin Picket M.D.   On: 11/17/2021 10:04    EKG: Independently reviewed.  NSR with rate 92; nonspecific ST changes with no evidence of acute ischemia   Labs on Admission: I have personally reviewed the available labs and imaging studies at the time of the admission.  Pertinent labs:  wbc: 11.9,  creatinine: 1.17 (.7-.8),  troponin: 94>delta pending,  BNP 1244,    Assessment/Plan COPD with acute exacerbation ([place])- (present on admission) 76 year old with end stage COPD presenting with acute on chronic respiratory failure due to COPD exacerbation vs. Worsening end stage COPD requiring bipap -admit her to progressive at this point, but after palliative care consult can move her to regular floor vs. Placement with hospice if she decides this.  -she is on hospice at home and palliative care has been consulted to see today. She would like to be treated for the COPD exacerbation until she speaks with them -will continue bipap for now, attempt to wean to her home 6-10L [place]. Her baseline oxygenation is in the 80s. Will  discuss this with RT as well.  -has met with palliative care and transitioning to full comfort care with hopes for  placement at [facility]. Will do comfort care only, continue duo nebs prn and albuterol prn   Acute on chronic respiratory failure with hypoxia ([place])- (present on admission) Secondary to end stage COPD vs. COPD exacerbation bnp elevated, but clinically she has no signs of volume overload and no signs on CXR.  Attempt to wean to home 10L [place] and palliative care will be seeing Just given morphine by pulmonology to help with her SOB and end stage COPD  Comfort care now and attempting to wean from bipap   AKI (acute kidney injury) ([place])- (present on admission) Comfort care   Elevated troponin- (present on admission) Likely demand ischemia in setting of acute on chronic hypoxic respiratory failure with tachycardia  She has no chest pain and no acute changes on ekg Trend delta, continue telemetry  Comfort care, will stop telemetry     Hypertension- (present on admission) Comfort care   Depression with anxiety- (present on admission) Continue paxil      Body mass index is 24.33 kg/m.   Level of care: Med-Surg DVT prophylaxis:  no vte, comfort care  Code Status: DNR- confirmed with patient/family Family Communication: None present Disposition Plan:  The patient is from: home  Anticipated d/c is to:    Patient is currently: acutely ill Consults called: palliative care  Admission status:  observation    Orma Flaming MD [facility]   How to contact the [facility] Attending or Consulting provider [place] or covering provider during after hours [place], for this patient?  Check the care team in [facility] and look for a) attending/consulting [facility] provider listed and b) the [facility] team listed Log into [email or web address] and use [place]'s universal password to access. If you do not have the password, please contact the hospital operator. Locate the [facility] provider you are  looking for under [facility] and page to a number that you can be directly reached. If you still have difficulty reaching the provider, please page the [facility] (Director on Call) for the Hospitalists listed on amion for assistance.   10/23/2021, 4:42 PM

## 2021-10-26 NOTE — Assessment & Plan Note (Addendum)
76 year old with end stage COPD presenting with acute on chronic respiratory failure due to COPD exacerbation vs. Worsening end stage COPD requiring bipap -admit her to progressive at this point, but after palliative care consult can move her to regular floor vs. Placement with hospice if she decides this.  -she is on hospice at home and palliative care has been consulted to see today. She would like to be treated for the COPD exacerbation until she speaks with them -will continue bipap for now, attempt to wean to her home 6-10L [place]. Her baseline oxygenation is in the 80s. Will discuss this with RT as well.  -has met with palliative care and transitioning to full comfort care with hopes for placement at [facility]. Will do comfort care only, continue duo nebs prn and albuterol prn

## 2021-10-26 NOTE — Progress Notes (Signed)
[facility] ED [facility])       This patient is a current hospice patient with [facility], admitted with a terminal diagnosis of COPD.    [facility] will continue to follow for any discharge planning needs and to coordinate continuation of hospice care.     Please call with any questions/concerns.    Thank you for the opportunity to participate in this patient's care. Daphene Calamity, MSW [facility] Liaison  [phone]

## 2021-10-26 NOTE — ED Notes (Signed)
Family [place] [phone] would like an update

## 2021-10-26 NOTE — ED Triage Notes (Signed)
Pt brought in by EMS for increased shortness of breath x3 days. EMS arrived and found sats in the 70s but poor waveforms, difficult to get accurate reading. Pt arrives on CPAP  170/100, HR irregular in 100s-1120s

## 2021-10-26 NOTE — Assessment & Plan Note (Addendum)
Secondary to end stage COPD vs. COPD exacerbation bnp elevated, but clinically she has no signs of volume overload and no signs on CXR.  Attempt to wean to home 10L [place] and palliative care will be seeing Just given morphine by pulmonology to help with her SOB and end stage COPD  Comfort care now and attempting to wean from bipap

## 2021-10-26 NOTE — Assessment & Plan Note (Addendum)
Likely demand ischemia in setting of acute on chronic hypoxic respiratory failure with tachycardia  She has no chest pain and no acute changes on ekg Trend delta, continue telemetry  Comfort care, will stop telemetry

## 2021-10-26 NOTE — Consult Note (Signed)
Consultation Note Date: 10/19/2021   Patient Name: Kayla Mack  DOB: 12-Jul-1946  MRN: [id number]  Age / Sex: 76 y.o., female  PCP: Janie Morning, DO Referring Physician: Orma Flaming, MD  Reason for Consultation: Establishing goals of care  HPI/Patient Profile: 76 y.o. 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 She wasn't able to breath even on 10L. Per EMS oxygen saturation was in the 70's. In the ED, chest x-ray showed severe emphysema but no acute findings. She was given duoneb, methylprednisolone, and placed on BiPAP. TRH called to admit.   Patient has been on home hospice services with [facility] since April 2022.   Clinical Assessment and Goals of Care: I have reviewed medical records including EPIC notes, labs and imaging, and discussed with ED Physician Dr. Roslynn Amble. I went to see patient at bedside to discuss diagnosis, prognosis, GOC, EOL wishes, disposition, and options.  She remains on BiPAP. Apparently, was taken off it earlier and did not tolerate this long at all. Her breathing is labored with accessory muscle usage. She is alert and appears anxious. Her speech is easy to understand even on BiPAP. Her home hospice RN Aaron Edelman is at bedside.   I introduced Palliative Medicine as specialized medical care for people living with serious illness. It focuses on providing relief from the symptoms and stress of a serious illness.   We discussed her current illness and what it means in the larger context of her ongoing co-morbidities.  Natural disease trajectory of end-stage COPD was discussed, emphasizing it is a progressive and non-curable illness. Patient verbalizes understanding that she is approaching EOL.   The difference between  full scope medical intervention and comfort care was considered. I reviewed the concept of a comfort path, emphasizing that this path involves de-escalating and stopping full scope medical interventions, allowing a natural course to occur. Discussed that the goal is comfort and dignity rather than cure/prolonging life.   We discussed options. First option was to be admitted to the hospital and receive treatment for COPD exacerbation. Discussed that even with treatment, she may or may not stabilize enough to return home. Discussed that if she did stabilize enough to return home, it would only be a matter of time before she had another exacerbation. Discussed that the second option was to focus on comfort only. This would mean starting a morphine infusion to help with the dyspnea/air hunger, then weaning the BiPAP as tolerated.   Patient is understandably emotional/tearful, but clearly expresses she wants to be comfortable. She asks me about her prognosis; discussed it could be several days to a week. Discussed the option to transfer to [facility] for a more peaceful setting at EOL - she is agreeable to this.   Per patient's request, I spoke with her niece/Layla by phone. I provided an update on patient's current condition and decision to transition to comfort  care. Layla lives in [place] but is going to drive to [place] today - expects to arrive at the hospital around midnight.   Questions and concerns were addressed.  The family was encouraged to call with questions or concerns.    Primary decision maker: Patient. Next of kin is her niece Danford Bad    SUMMARY OF RECOMMENDATIONS   Full comfort measures initiated Transfer to [facility] when bed becomes available  Morphine infusion May increase rate by 1 mg every 15 minutes as needed for uncontrolled pain or dyspnea. Please administer bolus dose prior to any rate increase Added orders for symptom management at EOL as well as discontinued  unnecessary orders  PRN medications are available for symptom management at EOL Once dyspnea improved, will attempt to transition off BiPAP PMT will continue to follow holistically  Symptom Management:  Lorazepam (ATIVAN) prn for anxiety Haloperidol (HALDOL) prn for agitation  Glycopyrrolate (ROBINUL) for excessive secretions Ondansetron (ZOFRAN) prn for nausea Polyvinyl alcohol (LIQUIFILM TEARS) prn for dry eyes Antiseptic oral rinse (BIOTENE) prn for dry mouth  Code Status/Advance Care Planning: DNR  Prognosis:  < 2 weeks  Discharge Planning: Hospice facility      Primary Diagnoses: Present on Admission:  COPD with acute exacerbation ([place])  Acute on chronic respiratory failure with hypoxia (HCC)  Elevated troponin  (Resolved) Depression and anxiety   Hypertension  (Resolved) Hyperlipidemia  (Resolved) GERD (gastroesophageal reflux disease)  Depression with anxiety  AKI (acute kidney injury) ([place])   I have reviewed the medical record, interviewed the patient and family, and examined the patient. The following aspects are pertinent.  Past Medical History:  Diagnosis Date   Anxiety    COPD (chronic obstructive pulmonary disease) (HCC)    Depression    Hyperlipidemia    Hypertension      Family History  Problem Relation Age of Onset   Heart disease Mother    Heart failure Mother    Heart disease Father        cabg   Heart disease Sister    Diabetes Sister    Scheduled Meds:  ipratropium-albuterol  3 mL Nebulization Q6H   methylPREDNISolone (SOLU-MEDROL) injection  80 mg Intravenous Q12H   sodium chloride flush  3 mL Intravenous Q12H   Continuous Infusions:  sodium chloride     morphine     PRN Meds:.sodium chloride, acetaminophen **OR** acetaminophen, antiseptic oral rinse, glycopyrrolate **OR** glycopyrrolate **OR** glycopyrrolate, haloperidol **OR** haloperidol **OR** haloperidol lactate, LORazepam **OR** LORazepam **OR** LORazepam, morphine,  ondansetron **OR** ondansetron (ZOFRAN) IV, polyvinyl alcohol, sodium chloride flush   Allergies  Allergen Reactions   Aleve [Naproxen Sodium] Hives   Aspirin Swelling    Facial swelling   Penicillins Hives and Swelling    Has patient had a PCN reaction causing immediate rash, facial/tongue/throat swelling, SOB or lightheadedness with hypotension: Yes Has patient had a PCN reaction causing severe rash involving mucus membranes or skin necrosis: Yes Has patient had a PCN reaction that required hospitalization: Unk Has patient had a PCN reaction occurring within the last 10 years: No If all of the above answers are "NO", then may proceed with Cephalosporin use.    Zoster Vaccine Live Other (See Comments)   Review of Systems  Respiratory:  Positive for shortness of breath.    Physical Exam Vitals reviewed.  Constitutional:      Appearance: She is ill-appearing.  Cardiovascular:     Rate and Rhythm: Tachycardia present.  Pulmonary:     Effort: Accessory  muscle usage present.  Neurological:     Mental Status: She is alert and oriented to person, place, and time.  Psychiatric:        Mood and Affect: Mood is anxious.    Vital Signs: BP 134/82    Pulse (!) 113    Temp 98 F (36.7 C) (Axillary)    Resp (!) 36    Ht _0  (1.575 m)    Wt 60.3 kg    SpO2 93%    BMI 24.33 kg/m  Pain Scale: 0-10   Pain Score: 0-No pain   SpO2: SpO2: 93 % O2 Device:SpO2: 93 % O2 Flow Rate: .   Palliative Assessment/Data: PPS 20%    MDM - High due to: 1 or more chronic illnesses with severe exacerbation, progression, or side effects of treatment OR acute or chronic illness or injury that poses a threat to life or bodily function Review of prior external notes, review of test results, assessment requiring an independent historian Decision not to resuscitate or to de-escalate care because poor prognosis   Signed by: Lavena Bullion, NP   Please contact Palliative Medicine Team phone at  [phone] for questions and concerns.  For individual provider: See Shea Evans

## 2021-10-26 NOTE — Assessment & Plan Note (Addendum)
Continue paxil

## 2021-10-27 DIAGNOSIS — R778 Other specified abnormalities of plasma proteins: Secondary | ICD-10-CM

## 2021-10-27 DIAGNOSIS — J9621 Acute and chronic respiratory failure with hypoxia: Secondary | ICD-10-CM

## 2021-10-27 MED ORDER — GLYCOPYRROLATE 0.2 MG/ML IJ SOLN
0.4000 mg | Freq: Once | INTRAMUSCULAR | Status: DC | PRN
  Filled 2021-10-27: qty 2

## 2021-10-27 MED ORDER — LORAZEPAM 2 MG/ML IJ SOLN
1.0000 mg | INTRAMUSCULAR | Status: DC
  Administered 2021-10-27: 1 mg via INTRAVENOUS
  Filled 2021-10-27: qty 1

## 2021-11-18 NOTE — Progress Notes (Signed)
[id number] - [facility]) hospital liaison note:  Patient is current [facility] hospice home care patient with a terminal diagnosis of COPD. Patient contacted hospice nurse on 1/9 to come to her home. Nurse called patient to assess and determined patient needed to go to the ED due to increased SOB and high level of oxygen need. Patient was transported to [facility] ED and admitted on 11/05/2021 with a diagnosis of acute respiratory distress and hypoxia. Per Dr. Karie Georges, [facility] physician this is a related hospital admission.  Visited at bedside prior to transfer to [facility] with friend, niece and home care hospice nurse at bedside. Patient is on BiPAP and not responsive. She does not appear to be in distress. Provided support to family and answered questions.   Patient is appropriate for inpatient due to symptom management and need for BiPAP.  VS: 97.7, 110/74, 114, 16, 97% BiPAP I/O: Not documented  Abnormal labs:  10/20/2021 09:28 Glucose: 125 (H) Creatinine: 1.17 (H) Anion gap: 17 (H) GFR, Estimated: 48 (L) B Natriuretic Peptide: 1,244.4 (H) Troponin I (High Sensitivity): 94 (H) WBC: 11.9 (H) NEUT#: 8.9 (H) Basophils Absolute: 0.2 (H)  IV/PRN medications: Morphine infusion 68m/hr, Ativan 175mIV scheduled q4hrs  Problem list: Elevated troponin   Hypertension   Depression with anxiety   Acute on chronic respiratory failure with hypoxia (HCC)   COPD with acute exacerbation (HCC)   AKI (acute kidney injury) ([place]  Acute on chronic hypoxic and hypercapnic respiratory failure secondary to acute on chronic/end-stage COPD exacerbation/AKI/Elevated troponin likely demand ischemia: Patient on BiPAP with shallow breathing.  Nearing death.   Discharge planning: Anticipated hospital death Family Contact: Spoke with family at bedside IDG: team updated GOC: Clear. Comfort care only. DNR  Please do not hesitate to call with questions.   Thank you,   MaFarrel GordonRN, [facility] HospitalLiaison   [phone]

## 2021-11-18 NOTE — Progress Notes (Signed)
RT NOTES: Removed patient from bipap and placed on 4lpm nasal cannula per palliative care.

## 2021-11-18 NOTE — Progress Notes (Signed)
PROGRESS NOTE    Kayla Mack  TZG:[id number] DOB: Jan 07, 1946 DOA: 10/25/2021 PCP: Janie Morning, DO   Brief Narrative:  Kayla Mack is a 76 y.o. female with medical history significant of severe COPD on chronic oxygen at 6-10L [place], HLD, HTN, depression and anxiety on hospice presented to ED with worsening shortness of breath that has been worsening x 1 month, but became progressively worse over the past 2 days. She wasn't able to breath even on her 10L [place]. She felt like she just couldn't get enough air. She tried nebulizer's at home and has a bipap machine that she wears at night and had to put this on. She is hospice for her end stage COPD.    Per EMS oxygen saturation was 70s, but poor waveform. her baseline oxygen is in the 80s.    ED Course: vitals: afebrile, bp: 155/114, HR: 114, RR: 20, oxygen: 98% on bipap Pertinent labs: wbc: 11.9, creatinine: 1.17 (.7-.8), troponin: 94>delta pending,  BNP 1244,  CXR: no acute findings. Severe emphysema.  In ED: given duoneb, methylprednisolone and started on bipap.  Admitted under hospitalist service.  Seen by palliative care and transitioned to full comfort care.  Assessment & Plan:   Active Problems:   Elevated troponin   Hypertension   Depression with anxiety   Acute on chronic respiratory failure with hypoxia (HCC)   COPD with acute exacerbation (HCC)   AKI (acute kidney injury) ([place])  Acute on chronic hypoxic and hypercapnic respiratory failure secondary to acute on chronic/end-stage COPD exacerbation/AKI/Elevated troponin likely demand ischemia: Patient on BiPAP with shallow breathing.  Nearing death.  Discussed with her niece at the bedside.  Patient on BiPAP but looks comfortable.  Continue current comfort measures.  Palliative care managing.  DVT prophylaxis:    Code Status: DNR  Family Communication: Niece present at bedside.  Status is: Inpatient  Remains inpatient appropriate because: Patient actively dying.   Palliative care looking into possible transferring to [facility].  Estimated body mass index is 24.33 kg/m as calculated from the following:   Height as of this encounter: _0  (1.575 m).   Weight as of this encounter: 60.3 kg.    Nutritional Assessment: Body mass index is 24.33 kg/m.[name] Seen by dietician.  I agree with the assessment and plan as outlined below: Nutrition Status:    Skin Assessment: I have examined the patient's skin and I agree with the wound assessment as performed by the wound care RN as outlined below:    Consultants:  Palliative care  Procedures:  None  Antimicrobials:  Anti-infectives (From admission, onward)    Start     Dose/Rate Route Frequency Ordered Stop   10/23/2021 1609  levofloxacin (LEVAQUIN) IVPB 750 mg  Status:  Discontinued        750 mg 100 mL/hr over 90 Minutes Intravenous Every 48 hours 10/23/2021 1445 11/05/2021 1527   10/24/2021 1445  ceFEPIme (MAXIPIME) 2 g in sodium chloride 0.9 % 100 mL IVPB  Status:  Discontinued        2 g 200 mL/hr over 30 Minutes Intravenous Every 8 hours 11/12/2021 1435 11/06/2021 1442         Subjective: Seen and examined.  Patient's somnolent, on BiPAP with shallow breathing but comfortable.  Objective: Vitals:   11/12/2021 0930 11-12-21 1056 November 12, 2021 1145 11/12/2021 1308  BP: 137/84  115/83 106/66  Pulse: (!) 119 (!) 122 (!) 121 (!) 111  Resp:   20 14  Temp:  TempSrc:      SpO2: (!) 89% 90% 96% 98%  Weight:      Height:       No intake or output data in the 24 hours ending 10-Nov-2021 1317 Filed Weights   10/20/2021 0925  Weight: 60.3 kg    Examination:  General exam: Appears calm and comfortable  Respiratory system: Diminished breath sounds bilaterally.  On BiPAP. Cardiovascular system: S1 & S2 heard, RRR. No JVD, murmurs, rubs, gallops or clicks. No pedal edema. Gastrointestinal system: Abdomen is nondistended, soft and nontender. No organomegaly or masses felt. Normal bowel sounds heard. Central  nervous system: Somnolent. Data Reviewed: I have personally reviewed following labs and imaging studies  CBC: Recent Labs  Lab 11/13/2021 0928  WBC 11.9*  NEUTROABS 8.9*  HGB 13.6  HCT 44.4  MCV 96.5  PLT 469   Basic Metabolic Panel: Recent Labs  Lab 11/12/2021 0928  NA 140  K 3.8  CL 99  CO2 24  GLUCOSE 125*  BUN 16  CREATININE 1.17*  CALCIUM 9.6   GFR: Estimated Creatinine Clearance: 34.5 mL/min (A) (by C-G formula based on SCr of 1.17 mg/dL (H)). Liver Function Tests: No results for input(s): AST, ALT, ALKPHOS, BILITOT, PROT, ALBUMIN in the last 168 hours. No results for input(s): LIPASE, AMYLASE in the last 168 hours. No results for input(s): AMMONIA in the last 168 hours. Coagulation Profile: No results for input(s): INR, PROTIME in the last 168 hours. Cardiac Enzymes: No results for input(s): CKTOTAL, CKMB, CKMBINDEX, TROPONINI in the last 168 hours. BNP (last 3 results) No results for input(s): PROBNP in the last 8760 hours. HbA1C: No results for input(s): HGBA1C in the last 72 hours. CBG: No results for input(s): GLUCAP in the last 168 hours. Lipid Profile: No results for input(s): CHOL, HDL, LDLCALC, TRIG, CHOLHDL, LDLDIRECT in the last 72 hours. Thyroid Function Tests: No results for input(s): TSH, T4TOTAL, FREET4, T3FREE, THYROIDAB in the last 72 hours. Anemia Panel: No results for input(s): VITAMINB12, FOLATE, FERRITIN, TIBC, IRON, RETICCTPCT in the last 72 hours. Sepsis Labs: No results for input(s): PROCALCITON, LATICACIDVEN in the last 168 hours.  Recent Results (from the past 240 hour(s))  Resp Panel by RT-PCR (Flu A&B, Covid) Nasopharyngeal Swab     Status: None   Collection Time: 11/14/2021  9:45 AM   Specimen: Nasopharyngeal Swab; Nasopharyngeal(NP) swabs in vial transport medium  Result Value Ref Range Status   SARS Coronavirus 2 by RT PCR NEGATIVE NEGATIVE Final    Comment: (NOTE) SARS-CoV-2 target nucleic acids are NOT DETECTED.  The  SARS-CoV-2 RNA is generally detectable in upper respiratory specimens during the acute phase of infection. The lowest concentration of SARS-CoV-2 viral copies this assay can detect is 138 copies/mL. A negative result does not preclude SARS-Cov-2 infection and should not be used as the sole basis for treatment or other patient management decisions. A negative result may occur with  improper specimen collection/handling, submission of specimen other than nasopharyngeal swab, presence of viral mutation(s) within the areas targeted by this assay, and inadequate number of viral copies(<138 copies/mL). A negative result must be combined with clinical observations, patient history, and epidemiological information. The expected result is Negative.  Fact Sheet for Patients:  EntrepreneurPulse.com.au  Fact Sheet for Healthcare Providers:  IncredibleEmployment.be  This test is no t yet approved or cleared by the [place] FDA and  has been authorized for detection and/or diagnosis of SARS-CoV-2 by FDA under an Emergency Use Authorization (EUA). This EUA will  remain  in effect (meaning this test can be used) for the duration of the COVID-19 declaration under Section 564(b)(1) of the Act, 21 U.S.C.section 360bbb-3(b)(1), unless the authorization is terminated  or revoked sooner.       Influenza A by PCR NEGATIVE NEGATIVE Final   Influenza B by PCR NEGATIVE NEGATIVE Final    Comment: (NOTE) The Xpert Xpress SARS-CoV-2/FLU/RSV plus assay is intended as an aid in the diagnosis of influenza from Nasopharyngeal swab specimens and should not be used as a sole basis for treatment. Nasal washings and aspirates are unacceptable for Xpert Xpress SARS-CoV-2/FLU/RSV testing.  Fact Sheet for Patients: EntrepreneurPulse.com.au  Fact Sheet for Healthcare Providers: IncredibleEmployment.be  This test is not yet approved or  cleared by the [place] FDA and has been authorized for detection and/or diagnosis of SARS-CoV-2 by FDA under an Emergency Use Authorization (EUA). This EUA will remain in effect (meaning this test can be used) for the duration of the COVID-19 declaration under Section 564(b)(1) of the Act, 21 U.S.C. section 360bbb-3(b)(1), unless the authorization is terminated or revoked.  Performed at [facility], [place] [address], [place], [place] [address]      Radiology Studies: DG Chest Portable 1 View  Result Date: 10/21/2021 CLINICAL DATA:  Shortness of breath. EXAM: PORTABLE CHEST 1 VIEW COMPARISON:  09/25/2018 and CT chest 09/27/2018. FINDINGS: Trachea is midline. Heart size stable. Severe emphysema. No airspace consolidation or pleural fluid. IMPRESSION: 1. No acute findings. 2.  Emphysema (ICD10-J43.9). Electronically Signed   By: Lorin Picket M.D.   On: 11/05/2021 10:04    Scheduled Meds:  ipratropium-albuterol  3 mL Nebulization Q6H   LORazepam  1 mg Intravenous Q4H   PARoxetine  40 mg Oral Daily   sodium chloride flush  3 mL Intravenous Q12H   Continuous Infusions:  sodium chloride     morphine 4 mg/hr (2021/11/15 1150)     LOS: 1 day   Time spent: 30 minutes  Darliss Cheney, MD [facility]  2021/11/15, 1:17 PM  Please page via Shea Evans and do not message via secure chat for anything urgent. Secure chat can be used for anything non urgent.  How to contact the [facility] Attending or Consulting provider [place] or covering provider during after hours [place], for this patient?  Check the care team in [facility] and look for a) attending/consulting [facility] provider listed and b) the [facility] team listed. Page or secure chat [phone]. Log into [email or web address] and use [place]'s universal password to access. If you do not have the password, please contact the hospital operator. Locate the [facility] provider you are looking for under [facility] and page to a number that you can be directly  reached. If you still have difficulty reaching the provider, please page the [facility] (Director on Call) for the Hospitalists listed on amion for assistance.

## 2021-11-18 NOTE — Progress Notes (Signed)
This chaplain responded to PMT NP-Julia referral for EOL spiritual care. The chaplain introduced herself to the Pt. RN-Jade.  The chaplain arrived at the bedside and greeted the Pt. niece-Layla and two special friends. The chaplain understands the Pt. Kayla Mack is on the way. The chaplain listened reflectively to the storytelling and celebration of life in the room. The chaplain stepped out of the room when Pastor-Don arrived. F/U spiritual care is available as needed.  Chaplain Sallyanne Kuster [phone]

## 2021-11-18 NOTE — ED Notes (Signed)
Pt resting comfortably in bed w/ family at bedside.

## 2021-11-18 NOTE — Death Summary Note (Signed)
Death Summary  Kayla Mack PQD:[id number] DOB: 04-Apr-1946 DOA: 10/29/21  PCP: Janie Morning, DO PCP/Office notified:   Admit date: October 29, 2021 Date of Death: 10/31/21  Final Diagnoses:  Active Problems:   Elevated troponin   Hypertension   Depression with anxiety   Acute on chronic respiratory failure with hypoxia (HCC)   COPD with acute exacerbation (HCC)   AKI (acute kidney injury) ([place])    Acute on chronic hypoxic and hypercapnic respiratory failure secondary acute on chronic/end-stage COPD exacerbation/AKI/Elevated troponin likely demand ischemia  History of present illness: [name] is a 76 y.o. female with medical history significant of severe COPD on chronic oxygen at 6-10L [place], HLD, HTN, depression and anxiety on hospice presenting to ED with worsening shortness of breath that has been worsening x 1 month, but became progressively worse over the past 2 days. She wasn't able to breath even on her 10L [place]. She felt like she just couldn't get enough air. She tried nebulizer's at home and has a bipap machine that she wears at night and had to put this on. She has increased coughing, isn't sure if she really has a change in her sputum production. She is hospice for her end stage COPD.     Per EMS oxygen saturation was 70s, but poor waveform. She tells me her baseline oxygen is in the 80s.    She denies any fever/chills, she has dizziness at time, no chest pain or palpitations, shortness of breath and cough as above, no abdominal pain, N/V/D, no leg swelling, she does endorse dysuria. Also has increased urgency and frequency. No hematuria.    Hospital Course:  Patient was admitted with acute on chronic hypoxic and hypercapnic respiratory failure secondary to acute on chronic end-stage COPD as well as AKI and demand ischemia.  Right after admission, palliative care saw patient and after discussion with the niece, patient was transitioned to full comfort care and eventually  passed away at 6:03 PM on 10-30-2021.   Time: 15 min  Signed:  Darliss Cheney  [facility] 10/31/2021, 4:14 PM

## 2021-11-18 NOTE — Progress Notes (Signed)
Daily Progress Note   Patient Name: Kayla Mack       Date: 11/10/21 DOB: [birth year]  Age: 76 y.o. MRN#: [id number] Attending Physician: Darliss Cheney, MD Primary Care Physician: Janie Morning, DO Admit Date: 11/01/2021  Reason for Consultation/Follow-up: symptom management, end of life care  HPI/Patient Profile: 76 y.o. 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 She wasn't able to breath even on 10L. Per EMS oxygen saturation was in the 70's. In the ED, chest x-ray showed severe emphysema but no acute findings. She was given duoneb, methylprednisolone, and placed on BiPAP. TRH called to admit.    Patient has been on home hospice services with [facility] since April 2022.   Subjective: Chart reviewed. Message received from RN overnight that BiPAP removal was attempted but not successful because patient was agitated and having increased work of breathing.   12:10 - I went to see patient in the ED. Morphine infusion is at 4 mg/hr. Patient appears comfortable. She is unresponsive to voice and light touch. No non-verbal signs of pain or discomfort noted. Respirations are shallow, but even and unlabored. No excessive respiratory secretions noted. Niece/Layla and close friend are present at bedside. Education and counseling provided on trajectory at EOL. Emotional support provided.   Albertina [place] states that her aunt would not want this process prolonged. Discussed that patient would likely pass rather quickly on removal of BiPAP support. Layla verbalizes understanding and requests it be done later today to give her chance to emotionally  prepare. I offered support from spiritual care - Albertina [place] is accepting and appreciative.   16:45 - Patient has transferred from the ED to 5N. Layla has reached out to me and let me know they are ready to remove the BiPAP. I went to bedside and was present for this process. BiPAP removed by RT at 16:55. I administered a bolus dose of morphine via the infusion prior to removal. After removal, I noted excessive respiratory secretions and asked her RN to administer a dose of IV robinul.    Length of Stay: 1  Physical Exam Vitals reviewed.  Constitutional:      General: She is not in acute distress.  Appearance: She is ill-appearing.  Pulmonary:     Comments: BiPAP, shallow breathing Neurological:     Mental Status: She is unresponsive.            Vital Signs: BP 115/83    Pulse (!) 121    Temp (!) 97 F (36.1 C) (Axillary)    Resp 20    Ht _0  (1.575 m)    Wt 60.3 kg    SpO2 96%    BMI 24.33 kg/m  SpO2: SpO2: 96 % O2 Device: O2 Device: Bi-PAP O2 Flow Rate: O2 Flow Rate (L/min): 10 L/min        Palliative Assessment/Data: PPS 10%     Palliative Care Assessment & Plan   Assessment: Acute on chronic hypoxic and hypercapnic respiratory failure secondary to acute on chronic COPD exacerbation AKI Elevated troponin, likely demand ischemia End of life care  Recommendations/Plan: Continue full comfort care Continue morphine infusion Continue scheduled ativan 1 mg every 4 hours Remove BiPAP  Symptom Management:  Haloperidol (HALDOL) prn for agitation  Glycopyrrolate (ROBINUL) for excessive secretions Ondansetron (ZOFRAN) prn for nausea Polyvinyl alcohol (LIQUIFILM TEARS) prn for dry eyes Antiseptic oral rinse (BIOTENE) prn for dry mouth   Goals of Care and Additional Recommendations: Limitations on Scope of Treatment: Full Comfort Care  Code Status: DNR/DNI   Prognosis:  Hours - Days  Discharge Planning: Anticipated Hospital Death    Thank you for allowing the  Palliative Medicine Team to assist in the care of this patient.   Total time: 68 minutes    Greater than 50%  of this time was spent counseling and coordinating care related to the above assessment and plan.  Lavena Bullion, NP  Please contact Palliative Medicine Team phone at [phone] for questions and concerns.

## 2021-11-18 DEATH — deceased

## 2021-11-25 ENCOUNTER — Ambulatory Visit: Payer: Medicare Other | Admitting: Emergency Medicine

## 2021-11-25 ENCOUNTER — Ambulatory Visit: Payer: Medicare Other | Admitting: Nurse Practitioner

## 2022-01-12 ENCOUNTER — Ambulatory Visit: Payer: Medicare Other | Admitting: Emergency Medicine
